# Patient Record
Sex: Male | Born: 1945 | Race: Black or African American | Hispanic: No | Marital: Married | Smoking: Never smoker
Health system: Southern US, Community
[De-identification: ages and names within clinical notes are randomized; demographics above are authoritative.]

## PROBLEM LIST (undated history)

## (undated) DIAGNOSIS — K7460 Unspecified cirrhosis of liver: Secondary | ICD-10-CM

## (undated) DIAGNOSIS — N400 Enlarged prostate without lower urinary tract symptoms: Secondary | ICD-10-CM

## (undated) DIAGNOSIS — R161 Splenomegaly, not elsewhere classified: Secondary | ICD-10-CM

## (undated) DIAGNOSIS — I10 Essential (primary) hypertension: Secondary | ICD-10-CM

## (undated) HISTORY — DX: Benign prostatic hyperplasia without lower urinary tract symptoms: N40.0

## (undated) HISTORY — DX: Splenomegaly, not elsewhere classified: R16.1

## (undated) HISTORY — DX: Unspecified cirrhosis of liver: K74.60

---

## 2016-10-25 HISTORY — PX: US ECHOCARDIOGRAPHY: HXRAD669

## 2018-03-25 HISTORY — PX: UPPER GASTROINTESTINAL ENDOSCOPY: SHX188

## 2018-04-14 ENCOUNTER — Other Ambulatory Visit: Payer: Self-pay

## 2018-04-14 ENCOUNTER — Ambulatory Visit (INDEPENDENT_AMBULATORY_CARE_PROVIDER_SITE_OTHER): Payer: Self-pay | Admitting: Family Medicine

## 2018-04-14 VITALS — BP 144/70 | HR 80 | Temp 99.1°F | Ht 64.5 in | Wt 147.6 lb

## 2018-04-14 DIAGNOSIS — R161 Splenomegaly, not elsewhere classified: Secondary | ICD-10-CM

## 2018-04-14 DIAGNOSIS — R8281 Pyuria: Secondary | ICD-10-CM

## 2018-04-14 DIAGNOSIS — Z0289 Encounter for other administrative examinations: Secondary | ICD-10-CM

## 2018-04-14 DIAGNOSIS — M545 Low back pain: Secondary | ICD-10-CM

## 2018-04-14 DIAGNOSIS — I10 Essential (primary) hypertension: Secondary | ICD-10-CM

## 2018-04-14 DIAGNOSIS — G8929 Other chronic pain: Secondary | ICD-10-CM

## 2018-04-14 DIAGNOSIS — N390 Urinary tract infection, site not specified: Secondary | ICD-10-CM

## 2018-04-14 DIAGNOSIS — K7460 Unspecified cirrhosis of liver: Secondary | ICD-10-CM

## 2018-04-14 DIAGNOSIS — M25562 Pain in left knee: Secondary | ICD-10-CM

## 2018-04-14 DIAGNOSIS — M25561 Pain in right knee: Secondary | ICD-10-CM

## 2018-04-14 DIAGNOSIS — R399 Unspecified symptoms and signs involving the genitourinary system: Secondary | ICD-10-CM

## 2018-04-14 LAB — POCT URINALYSIS DIP (MANUAL ENTRY)
Bilirubin, UA: NEGATIVE
Blood, UA: NEGATIVE
GLUCOSE UA: NEGATIVE mg/dL
Ketones, POC UA: NEGATIVE mg/dL
NITRITE UA: NEGATIVE
PROTEIN UA: NEGATIVE mg/dL
Spec Grav, UA: 1.015 (ref 1.010–1.025)
UROBILINOGEN UA: 1 U/dL
pH, UA: 7.5 (ref 5.0–8.0)

## 2018-04-14 MED ORDER — TAMSULOSIN HCL 0.4 MG PO CAPS: 0.8000 mg | ORAL_CAPSULE | Freq: Every day | ORAL | 1 refills | Status: DC

## 2018-04-14 NOTE — Progress Notes (Signed)
[place] interpreter Fani utilized during today's visit.  [facility] Patient Visit  HPI:  Patient presents to [facility] today for a new patient appointment to establish general primary care, also to discuss multiple issues present from his life in [place]:  1.  Chronic pain:  Knees and back.  Describes aching pain in BL lumbar region of back, worse when bending forward.  Not worse on left versus right side. Pain is 8 / 10.  Doesn't have any OTC analgesics.  Doesn't radiate.  No injuries that he's know of.  Pain suddenly started.    No LE weakness or changes in gait.  No motor weakness/decreased sensation BL LE's.  No fevers or chills.  No dysuria, hematuria.  Does have frequency as below.  No bowel bladder or bowel.    2.  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  Ongoing issue for him for years.  Nocturia 5-6 x nightly almost every night.  Also has frequent urinary accidents where he is not able to make it to the restroom.  No actual incontinence, he always knows when he is going to urinate, just can't make it on time.    3.  Splenomegaly/cirrhosis:  Diagnosed while overseas.  No clear indication.  Negative Hep b per paperwork.  Seen by GI doctor.  Brought records -- show splenomegaly without size provided and small, cirrhotic liver.  EGD showed esophageal candidiasis for which he was treated and varices Grade 2. S/p banding.  No abdominal pain currently.  No bleeding that he's noted.  No melena.  No icterus/jaundice.    Per overseas  paperwork patient has been prescribed taking any candesartan 60 mg, amlodipine 5 mg, vitamin, rosuvastatin 10 mg, tamsulosin 0.4 mg, finasteride 5 mg.  Negative Echo while overseas.  However today he tells me that he is not actually medicines and has not been for some time.  4.  Difficulty seeing: Has known refractive error both eyes based on overseas paperwork.  Has difficulty seeing especially out of his right eye but this is also bilateral.  Is  never worn glasses.  Has never seen an eye doctor before.    ROS: as above, see HPI  Past Medical Hx:  -as above -- no other issues  Past Surgical Hx:  -denies any surgeries  Family Hx: updated in [place] - traveled with some of his children here in [place].  Daughter with epilepsy.  He lost several children and his first wife in the initial flight from [facility].  (became very tearful when discussing this).  Otherwise denies any family history of chronic medical conditions.    Immigrant Social History: - Date arrived in [place]: Apr 08, 2018 - Country of origin: [facility] - Location of refugee camp (if applicable), how long there, and what caused patient to leave home country?: [place] - Primary language: Swahili  -Requires intepreter (essentially speaks no [place]) - Prior work: Insurance underwriter - Lawyer use: denies - Marriage Status: married to wife  - Sexual activity: women - Class B conditions: n/a - Were you beaten or tortured in your country or refugee camp?  Denies    Preventative Care History: -Seen at health department?: yes  PHYSICAL EXAM: BP (!) 144/70   Pulse 80   Temp 99.1 F (37.3 C) (Oral)   Ht 5' 4.5" (1.638 m)   Wt 147 lb 9.6 oz (67 kg)   SpO2 99%   BMI 24.94 kg/m  Gen: Vital signs reviewed.  Elderly male who moves  slowly due to pain in back.  He has urine stains on his trousers.  However he is very much awake and alert and oriented x 4.  Knows his PMH very well.   Head: [place]/AT.   Eyes:  EOMI, PERRL.  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  Ears:  External ears WNL, Bilateral TM's normal without retraction, redness or bulging. Nose:  Septum midline  Mouth:  MMM, tonsils non-erythematous, non-edematous.   Neck:  No JVD or LAD noted Cardiac:  Regular rate and rhythm without murmur auscultated.  Good S1/S2. Pulm:  Clear to auscultation bilaterally with good air movement.  No wheezes or rales  noted.   Abd:  Soft/nondistended/nontender.  I cannot palpate any obvious organomegaly.  Good BS throughout.   Ext:  No clubbing/cyanosis/erythema.  No edema noted bilateral lower extremities.   Neuro:  Alert and oriented to person, place, and date.  No focal deficits noted.   Skin:  Small scars scattered across BL LE's.  Also with 5 mm linear scars across abdomen consistent with prior medicinal scarring.   Psych:  Not depressed or anxious appearing.  Linear and coherent thought process as evidenced by speech pattern. Smiles spontaneously.   Almost 1.5 hours spent in total/direct patient care, including exam and reviewing all overseas records.

## 2018-04-14 NOTE — Patient Instructions (Signed)
It was very good to meet you today.  We are going to do some blood tests and schedule you for an ultrasound test of your stomach.  The same day, you will get xrays of your back and knees.  Come back and see me in 2 weeks.    Take the Flomax medicine once a day to help with your urination and accidents.

## 2018-04-16 ENCOUNTER — Encounter: Payer: Self-pay | Admitting: Family Medicine

## 2018-04-16 DIAGNOSIS — Z0289 Encounter for other administrative examinations: Secondary | ICD-10-CM | POA: Insufficient documentation

## 2018-04-16 DIAGNOSIS — K7460 Unspecified cirrhosis of liver: Secondary | ICD-10-CM | POA: Insufficient documentation

## 2018-04-16 DIAGNOSIS — G8929 Other chronic pain: Secondary | ICD-10-CM | POA: Insufficient documentation

## 2018-04-16 DIAGNOSIS — I10 Essential (primary) hypertension: Secondary | ICD-10-CM | POA: Insufficient documentation

## 2018-04-16 DIAGNOSIS — R161 Splenomegaly, not elsewhere classified: Secondary | ICD-10-CM | POA: Insufficient documentation

## 2018-04-16 DIAGNOSIS — R399 Unspecified symptoms and signs involving the genitourinary system: Secondary | ICD-10-CM | POA: Insufficient documentation

## 2018-04-16 DIAGNOSIS — M25569 Pain in unspecified knee: Secondary | ICD-10-CM

## 2018-04-16 DIAGNOSIS — M549 Dorsalgia, unspecified: Secondary | ICD-10-CM

## 2018-04-16 LAB — URINE CULTURE

## 2018-04-16 NOTE — Assessment & Plan Note (Signed)
Per overseas paperwork.  Not here.  Will wait before starting any antihypertensives.   Was supposed to be on amlodipine and ARB, but not taking these.

## 2018-04-16 NOTE — Assessment & Plan Note (Signed)
Based on overseas paperwork.  None that I can feel today on exam.  Obtaining abd [place] to assess.

## 2018-04-16 NOTE — Assessment & Plan Note (Signed)
Chronic for many years.  No radiation to LE's.  Does occasionally have radiation to buttocks.  Moves slowly secondary to this.  Not taking anything for relief.  Due to lack of prior workup and fact he has had this going on for so many years, plan imaging of back.  Will need to look for any lytic lesions with history of BPH to ensure no mets from any possible prostate CA.

## 2018-04-16 NOTE — Assessment & Plan Note (Addendum)
Newly arrived.  With multiple chronic medical conditions.  - has NOT been seen at HD.  Will obtain screening labs today.   - will FU with me in 2 weeks to address his numerous medical issues.   - doing well with transition thus far.

## 2018-04-16 NOTE — Assessment & Plan Note (Signed)
Per overseas paperwork.  S/p EGD while in [place] which showed varices which were banded.  Hep negative there, will repeat here.  Likely contributor to splenomegaly.

## 2018-04-16 NOTE — Assessment & Plan Note (Signed)
With frequent urination.  Occasional incontinence, which does NOT sound like sudden loss of urine, but rather inability to get to bathroom in time due to his chronic back and knee pain.  Much less likely this is cauda equina symptoms.  Never had any loss of bowels. - starting flomax today.  He was prescribed this overseas but either wasn't told or never picked this up.   - Obtain PSA.  Back xrays pending.

## 2018-04-18 LAB — CBC WITH DIFFERENTIAL/PLATELET
BASOS ABS: 0 10*3/uL (ref 0.0–0.2)
BASOS: 0 %
EOS (ABSOLUTE): 0.4 10*3/uL (ref 0.0–0.4)
EOS: 7 %
HEMATOCRIT: 38.5 % (ref 37.5–51.0)
HEMOGLOBIN: 13.5 g/dL (ref 13.0–17.7)
IMMATURE GRANS (ABS): 0 10*3/uL (ref 0.0–0.1)
Immature Granulocytes: 0 %
LYMPHS: 38 %
Lymphocytes Absolute: 2.2 10*3/uL (ref 0.7–3.1)
MCH: 34.7 pg — AB (ref 26.6–33.0)
MCHC: 35.1 g/dL (ref 31.5–35.7)
MCV: 99 fL — ABNORMAL HIGH (ref 79–97)
MONOCYTES: 16 %
Monocytes Absolute: 0.9 10*3/uL (ref 0.1–0.9)
NEUTROS ABS: 2.2 10*3/uL (ref 1.4–7.0)
Neutrophils: 39 %
Platelets: 145 10*3/uL — ABNORMAL LOW (ref 150–450)
RBC: 3.89 x10E6/uL — ABNORMAL LOW (ref 4.14–5.80)
RDW: 15.2 % (ref 12.3–15.4)
WBC: 5.6 10*3/uL (ref 3.4–10.8)

## 2018-04-18 LAB — COMPREHENSIVE METABOLIC PANEL
ALBUMIN: 3.9 g/dL (ref 3.5–4.8)
ALT: 29 IU/L (ref 0–44)
AST: 43 IU/L — ABNORMAL HIGH (ref 0–40)
Albumin/Globulin Ratio: 0.8 — ABNORMAL LOW (ref 1.2–2.2)
Alkaline Phosphatase: 127 IU/L — ABNORMAL HIGH (ref 39–117)
BUN / CREAT RATIO: 11 (ref 10–24)
BUN: 10 mg/dL (ref 8–27)
Bilirubin Total: 0.8 mg/dL (ref 0.0–1.2)
CO2: 25 mmol/L (ref 20–29)
CREATININE: 0.92 mg/dL (ref 0.76–1.27)
Calcium: 9.7 mg/dL (ref 8.6–10.2)
Chloride: 101 mmol/L (ref 96–106)
GFR calc non Af Amer: 82 mL/min/{1.73_m2} (ref >59)
GFR, EST AFRICAN AMERICAN: 94 mL/min/{1.73_m2} (ref >59)
GLOBULIN, TOTAL: 5 g/dL — AB (ref 1.5–4.5)
GLUCOSE: 80 mg/dL (ref 65–99)
Potassium: 4.4 mmol/L (ref 3.5–5.2)
SODIUM: 140 mmol/L (ref 134–144)
TOTAL PROTEIN: 8.9 g/dL — AB (ref 6.0–8.5)

## 2018-04-18 LAB — LIPID PANEL
Chol/HDL Ratio: 4.8 ratio (ref 0.0–5.0)
Cholesterol, Total: 143 mg/dL (ref 100–199)
HDL: 30 mg/dL — ABNORMAL LOW (ref >39)
LDL CALC: 96 mg/dL (ref 0–99)
Triglycerides: 87 mg/dL (ref 0–149)
VLDL CHOLESTEROL CAL: 17 mg/dL (ref 5–40)

## 2018-04-18 LAB — QUANTIFERON-TB GOLD PLUS
QUANTIFERON NIL VALUE: 0.09 [IU]/mL
QUANTIFERON-TB GOLD PLUS: POSITIVE — AB
QuantiFERON Mitogen Value: 3.77 IU/mL
QuantiFERON TB1 Ag Value: 0.45 IU/mL
QuantiFERON TB2 Ag Value: 0.41 IU/mL

## 2018-04-18 LAB — TSH: TSH: 1.14 u[IU]/mL (ref 0.450–4.500)

## 2018-04-18 LAB — VARICELLA ZOSTER ANTIBODY, IGG: VARICELLA: 2227 {index} (ref >165)

## 2018-04-18 LAB — SICKLE CELL SCREEN: SICKLE CELL SCREEN: NEGATIVE

## 2018-04-18 LAB — HIV ANTIBODY (ROUTINE TESTING W REFLEX): HIV SCREEN 4TH GENERATION: NONREACTIVE

## 2018-04-18 LAB — HEPATITIS C ANTIBODY

## 2018-04-18 LAB — HEPATITIS B SURFACE ANTIGEN: HEP B S AG: NEGATIVE

## 2018-04-22 ENCOUNTER — Encounter: Payer: Self-pay | Admitting: Family Medicine

## 2018-04-22 ENCOUNTER — Ambulatory Visit (HOSPITAL_COMMUNITY): Admission: RE | Admit: 2018-04-22 | Payer: Self-pay | Source: Ambulatory Visit

## 2018-04-30 ENCOUNTER — Encounter: Payer: Self-pay | Admitting: Family Medicine

## 2018-05-01 ENCOUNTER — Other Ambulatory Visit: Payer: Self-pay | Admitting: Internal Medicine

## 2018-05-01 ENCOUNTER — Other Ambulatory Visit: Payer: Self-pay | Admitting: Family Medicine

## 2018-05-01 ENCOUNTER — Ambulatory Visit
Admission: RE | Admit: 2018-05-01 | Discharge: 2018-05-01 | Disposition: A | Discharge status: Home / Self Care | Payer: Self-pay | Source: Ambulatory Visit | Attending: Family Medicine | Admitting: Family Medicine

## 2018-05-01 ENCOUNTER — Ambulatory Visit
Admission: RE | Admit: 2018-05-01 | Discharge: 2018-05-01 | Disposition: A | Discharge status: Home / Self Care | Payer: Self-pay | Source: Ambulatory Visit | Attending: Internal Medicine | Admitting: Internal Medicine

## 2018-05-01 ENCOUNTER — Other Ambulatory Visit: Payer: Self-pay

## 2018-05-01 DIAGNOSIS — G8929 Other chronic pain: Secondary | ICD-10-CM

## 2018-05-01 DIAGNOSIS — M25561 Pain in right knee: Principal | ICD-10-CM

## 2018-05-01 DIAGNOSIS — M545 Low back pain: Principal | ICD-10-CM

## 2018-05-01 DIAGNOSIS — M25562 Pain in left knee: Principal | ICD-10-CM

## 2018-05-01 DIAGNOSIS — A150 Tuberculosis of lung: Secondary | ICD-10-CM

## 2018-05-05 ENCOUNTER — Ambulatory Visit (HOSPITAL_COMMUNITY)
Admission: RE | Admit: 2018-05-05 | Discharge: 2018-05-05 | Disposition: A | Discharge status: Home / Self Care | Payer: Medicaid Other | Source: Ambulatory Visit | Attending: Family Medicine | Admitting: Family Medicine

## 2018-05-05 ENCOUNTER — Encounter: Payer: Self-pay | Admitting: Family Medicine

## 2018-05-05 ENCOUNTER — Ambulatory Visit (INDEPENDENT_AMBULATORY_CARE_PROVIDER_SITE_OTHER): Payer: Medicaid Other | Admitting: Family Medicine

## 2018-05-05 ENCOUNTER — Other Ambulatory Visit: Payer: Self-pay

## 2018-05-05 VITALS — BP 134/72 | HR 75 | Temp 98.3°F | Ht 65.0 in | Wt 144.8 lb

## 2018-05-05 DIAGNOSIS — R161 Splenomegaly, not elsewhere classified: Secondary | ICD-10-CM

## 2018-05-05 DIAGNOSIS — M25561 Pain in right knee: Secondary | ICD-10-CM

## 2018-05-05 DIAGNOSIS — M25562 Pain in left knee: Secondary | ICD-10-CM

## 2018-05-05 DIAGNOSIS — G8929 Other chronic pain: Secondary | ICD-10-CM | POA: Diagnosis not present

## 2018-05-05 DIAGNOSIS — R399 Unspecified symptoms and signs involving the genitourinary system: Secondary | ICD-10-CM | POA: Diagnosis present

## 2018-05-05 DIAGNOSIS — K7460 Unspecified cirrhosis of liver: Secondary | ICD-10-CM

## 2018-05-05 DIAGNOSIS — K5900 Constipation, unspecified: Secondary | ICD-10-CM | POA: Diagnosis not present

## 2018-05-05 DIAGNOSIS — M545 Low back pain: Secondary | ICD-10-CM | POA: Diagnosis not present

## 2018-05-05 LAB — POCT URINALYSIS DIP (MANUAL ENTRY)
Bilirubin, UA: NEGATIVE
GLUCOSE UA: NEGATIVE mg/dL
Ketones, POC UA: NEGATIVE mg/dL
NITRITE UA: NEGATIVE
PH UA: 6.5 (ref 5.0–8.0)
Spec Grav, UA: 1.02 (ref 1.010–1.025)

## 2018-05-05 MED ORDER — TAMSULOSIN HCL 0.4 MG PO CAPS: 0.8000 mg | ORAL_CAPSULE | Freq: Every day | ORAL | 1 refills | Status: DC

## 2018-05-05 MED ORDER — TRAMADOL HCL 50 MG PO TABS: 50.0000 mg | ORAL_TABLET | Freq: Three times a day (TID) | ORAL | 0 refills | Status: DC | PRN

## 2018-05-05 NOTE — Patient Instructions (Signed)
Take the Tramadol for pain every 8 hours as needed.  Take the Tamulosin 2 pills in the AM for your bladder once a day.  Come back and see me in 1 month.    TRamadol = pain 1 pill every 8 hours  TAmsulosin = urination 2 pills once a day

## 2018-05-05 NOTE — Progress Notes (Signed)
Subjective:    Jim Little is a 72 y.o. male who presents to [facility] today for FU for back and knee pain:  1.  Back pain:  Chronic for many years.  Worse with prolonged standing and sitting.   Does have some urge incontinence as below.  No bowel incontinence.  No saddle anesthesia.  Had xrays of back and would like to know the results.  Dull aching pain in back.    2.  Knee pain:  Also persists.  BL.  Worse with prolonged sitting.  Doesn't have anything at home to take for relief.  Dull aching pain BL knees.    3.  LUTS:  Ongoing.  Has had stress and episodes of urge incontinence worse at night.  Nocturia 1-2 x nightly.  No overt bladder "leakage" consistent with diabetic neuropathy or cauda equina syndrome.  Was taking flomax overseas with relief.  No hematuria.  Per himself and his son -- there was some miscommunication last visit evidently, and he never picked up the flomax from last visit.    4.  Constipation:  Seen incidentally on my review of back xrays actual images.  When asked about this, reports daily constipation.  Often goes 2-3 days without BM.  Hard stool.  Started after change in diet when coming to the [place].  Hasn't tried anything for relief.     ROS as above per HPI.    The following portions of the patient's history were reviewed and updated as appropriate: allergies, current medications, past medical history, family and social history, and problem list. Patient is a nonsmoker.    PMH reviewed.  Past Medical History:  Diagnosis Date  . BPH (benign prostatic hyperplasia)   . Cirrhosis ([place])   . Splenomegaly    Past Surgical History:  Procedure Laterality Date  . UPPER GASTROINTESTINAL ENDOSCOPY  03/2018   Per overseas records --candidiasis plus esophageal varices grade 2.  Treated with fluconazole and status post esophageal banding.  Marland Kitchen US ECHOCARDIOGRAPHY  10/2016   per overseas records from [place] - LVEF 68%; mildly calcified aortic valve, normal function otherwise     Medications reviewed. Current Outpatient Medications  Medication Sig Dispense Refill  . tamsulosin (FLOMAX) 0.4 MG CAPS capsule Take 2 capsules (0.8 mg total) by mouth daily. 60 capsule 1   No current facility-administered medications for this visit.      Objective:   Physical Exam BP 134/72   Pulse 75   Temp 98.3 F (36.8 C) (Oral)   Ht 5\' 5"  (1.651 m)   Wt 144 lb 12.8 oz (65.7 kg)   SpO2 98%   BMI 24.10 kg/m  Gen:  Alert, cooperative patient who appears stated age in no acute distress.  Vital signs reviewed. HEENT: EOMI,  MMM.  Cortical cataract noted Right eye.   Cardiac:  Regular rate and rhythm without murmur auscultated.  Good S1/S2. Pulm:  Clear to auscultation bilaterally with good air movement.  No wheezes or rales noted.   Abd:  Soft/nondistended/nontender.   Exts: Non edematous BL  LE, warm and well perfused.  Back:  No bruising or lesions.  Some mild tenderness with direct palpation of

## 2018-05-06 ENCOUNTER — Encounter: Payer: Self-pay | Admitting: Family Medicine

## 2018-05-06 DIAGNOSIS — K5900 Constipation, unspecified: Secondary | ICD-10-CM | POA: Insufficient documentation

## 2018-05-06 LAB — CBC
HEMATOCRIT: 38.2 % (ref 37.5–51.0)
HEMOGLOBIN: 12.7 g/dL — AB (ref 13.0–17.7)
MCH: 33.5 pg — ABNORMAL HIGH (ref 26.6–33.0)
MCHC: 33.2 g/dL (ref 31.5–35.7)
MCV: 101 fL — AB (ref 79–97)
Platelets: 110 10*3/uL — ABNORMAL LOW (ref 150–450)
RBC: 3.79 x10E6/uL — ABNORMAL LOW (ref 4.14–5.80)
RDW: 15.2 % (ref 12.3–15.4)
WBC: 4.6 10*3/uL (ref 3.4–10.8)

## 2018-05-06 LAB — COMPREHENSIVE METABOLIC PANEL
ALBUMIN: 3.9 g/dL (ref 3.5–4.8)
ALK PHOS: 130 IU/L — AB (ref 39–117)
ALT: 25 IU/L (ref 0–44)
AST: 41 IU/L — ABNORMAL HIGH (ref 0–40)
Albumin/Globulin Ratio: 0.9 — ABNORMAL LOW (ref 1.2–2.2)
BUN / CREAT RATIO: 8 — AB (ref 10–24)
BUN: 7 mg/dL — AB (ref 8–27)
Bilirubin Total: 1.1 mg/dL (ref 0.0–1.2)
CO2: 24 mmol/L (ref 20–29)
Calcium: 9.3 mg/dL (ref 8.6–10.2)
Chloride: 104 mmol/L (ref 96–106)
Creatinine, Ser: 0.91 mg/dL (ref 0.76–1.27)
GFR calc Af Amer: 96 mL/min/{1.73_m2} (ref >59)
GFR calc non Af Amer: 83 mL/min/{1.73_m2} (ref >59)
GLUCOSE: 82 mg/dL (ref 65–99)
Globulin, Total: 4.5 g/dL (ref 1.5–4.5)
Potassium: 4.1 mmol/L (ref 3.5–5.2)
Sodium: 140 mmol/L (ref 134–144)
Total Protein: 8.4 g/dL (ref 6.0–8.5)

## 2018-05-06 LAB — PSA: Prostate Specific Ag, Serum: 2.6 ng/mL (ref 0.0–4.0)

## 2018-05-06 NOTE — Assessment & Plan Note (Signed)
Liver ultrasound showed mild splenomegaly and probably cirrhosis.  No CA identified.  - asymptomatic.  Rechecking platelets, CMET today.   - No changes to plan currently

## 2018-05-06 NOTE — Assessment & Plan Note (Signed)
Tricompartmental changes.   Holding off on surgery for now.  Tramadol for as needed pain relief.   FU in 1 month to assess for improvement.

## 2018-05-06 NOTE — Assessment & Plan Note (Signed)
No red flags.  Bladder incontinence NOT related to back pain.  No evidence of bowel incontinence or saddle anesthesia.  Tramadol for pain relief for severe pain.  Tylenol for mild pain.

## 2018-05-06 NOTE — Assessment & Plan Note (Signed)
Persist.   - These do not seem to be related to his back pain.  Checking PSA today.   He never picked up the flomax.  I have printed him a prescription for this.

## 2018-05-06 NOTE — Assessment & Plan Note (Signed)
Treating with OTC Senokot as stimulant laxative.

## 2018-06-15 ENCOUNTER — Encounter: Payer: Self-pay | Admitting: Family Medicine

## 2018-07-14 ENCOUNTER — Other Ambulatory Visit: Payer: Self-pay | Admitting: Internal Medicine

## 2018-07-14 DIAGNOSIS — R7611 Nonspecific reaction to tuberculin skin test without active tuberculosis: Secondary | ICD-10-CM

## 2018-07-15 ENCOUNTER — Encounter: Payer: Self-pay | Admitting: Family Medicine

## 2018-07-15 ENCOUNTER — Ambulatory Visit (INDEPENDENT_AMBULATORY_CARE_PROVIDER_SITE_OTHER): Payer: Medicaid Other | Admitting: Family Medicine

## 2018-07-15 VITALS — BP 132/84 | HR 65 | Temp 98.4°F | Ht 65.0 in | Wt 142.8 lb

## 2018-07-15 DIAGNOSIS — N3941 Urge incontinence: Secondary | ICD-10-CM | POA: Diagnosis not present

## 2018-07-15 DIAGNOSIS — R399 Unspecified symptoms and signs involving the genitourinary system: Secondary | ICD-10-CM

## 2018-07-15 DIAGNOSIS — M25562 Pain in left knee: Secondary | ICD-10-CM | POA: Diagnosis not present

## 2018-07-15 DIAGNOSIS — M25561 Pain in right knee: Secondary | ICD-10-CM | POA: Diagnosis not present

## 2018-07-15 DIAGNOSIS — D539 Nutritional anemia, unspecified: Secondary | ICD-10-CM | POA: Diagnosis not present

## 2018-07-15 DIAGNOSIS — G8929 Other chronic pain: Secondary | ICD-10-CM

## 2018-07-15 DIAGNOSIS — H269 Unspecified cataract: Secondary | ICD-10-CM | POA: Insufficient documentation

## 2018-07-15 LAB — POCT URINALYSIS DIP (MANUAL ENTRY)
BILIRUBIN UA: NEGATIVE
BILIRUBIN UA: NEGATIVE mg/dL
GLUCOSE UA: NEGATIVE mg/dL
Nitrite, UA: POSITIVE — AB
Protein Ur, POC: 30 mg/dL — AB
SPEC GRAV UA: 1.015 (ref 1.010–1.025)
Urobilinogen, UA: 1 E.U./dL
pH, UA: 7 (ref 5.0–8.0)

## 2018-07-15 MED ORDER — TRAMADOL HCL 50 MG PO TABS: 50.0000 mg | ORAL_TABLET | Freq: Three times a day (TID) | ORAL | 1 refills | Status: DC | PRN

## 2018-07-15 MED ORDER — TAMSULOSIN HCL 0.4 MG PO CAPS: 0.8000 mg | ORAL_CAPSULE | Freq: Every day | ORAL | 3 refills | Status: DC

## 2018-07-15 NOTE — Assessment & Plan Note (Signed)
Is health is overall pretty good.  He has diagnosis of hypertension but this is controlled. -Cataract removal is a low risk procedure and he is low risk for any events during the procedure. -Can forward this note to his ophthalmologist who is [facility].  I can feel any information the need for me.

## 2018-07-15 NOTE — Progress Notes (Signed)
Subjective:    Jim Little is a 72 y.o. male who presents to [facility] today for cataract clearance:  1.  Cataracts: Needs surgical clearance for cataract according to the patient.  Has cataracts bilaterally.  Worse in the right eye.  He is scheduled for surgery after his clearance.  No injury to his eye.  Vision is blurry and worse at night.  2.  Urge incontinence: Persist.  His case manager is present with him today.  Corroborates his story.  He has urge incontinence most anytime he stands to go the bathroom.  Has the urge to go and stands and has incontinence.  Denies any stress incontinence.  Was better with the Flomax but is run out of this and did not really seem to pick up anymore.  No hematuria.  No flank pain.  No fevers chills.  3.  Knee pain:  Bilateral.  RIght worse than Left.  Long-standing issue for the patient for many years.  He has x-rays that show tricompartmental degeneration of both knees.  He is interested in having this evaluated by orthopedic.  No falls.  No injuries to his knees.  ROS as above per HPI.   The following portions of the patient's history were reviewed and updated as appropriate: allergies, current medications, past medical history, family and social history, and problem list. Patient is a nonsmoker.    PMH reviewed.  Past Medical History:  Diagnosis Date  . BPH (benign prostatic hyperplasia)   . Cirrhosis ([place])   . Splenomegaly    Past Surgical History:  Procedure Laterality Date  . UPPER GASTROINTESTINAL ENDOSCOPY  03/2018   Per overseas records --candidiasis plus esophageal varices grade 2.  Treated with fluconazole and status post esophageal banding.  Marland Kitchen US ECHOCARDIOGRAPHY  10/2016   per overseas records from [place] - LVEF 68%; mildly calcified aortic valve, normal function otherwise    Medications reviewed. Current Outpatient Medications  Medication Sig Dispense Refill  . tamsulosin (FLOMAX) 0.4 MG CAPS capsule Take 2 capsules (0.8 mg total) by mouth  daily. 60 capsule 1  . traMADol (ULTRAM) 50 MG tablet Take 1 tablet (50 mg total) by mouth every 8 (eight) hours as needed. 30 tablet 0   No current facility-administered medications for this visit.      Objective:   Physical Exam BP 132/84   Pulse 65   Temp 98.4 F (36.9 C) (Oral)   Ht 5\' 5"  (1.651 m)   Wt 142 lb 12.8 oz (64.8 kg)   SpO2 97%   BMI 23.76 kg/m  Gen:  Alert, cooperative patient who appears stated age in no acute distress.  Vital signs reviewed. HEENT: EOMI,  MMM bilateral cataracts noted.  Right eye with cortical cataract. Cardiac:  Regular rate and rhythm without murmur auscultated.  Good S1/S2. Pulm:  Clear to auscultation bilaterally with good air movement.  No wheezes or rales noted.   Abd:  Soft/nondistended/nontender.  Good bowel sounds throughout all four quadrants.  No masses noted.  Knees: Both knees with arthritic changes bilaterally.  Tender to palpation along the joint line bilaterally.  He walks with a limp.  His right knee is somewhat worse swollen than left.  He has no palpable effusion or redness.  No results found for this or any previous visit (from the past 72 hour(s)).

## 2018-07-15 NOTE — Assessment & Plan Note (Signed)
Tricompartmental changes bilaterally based on x-ray.-I will refer him to orthopedics for further evaluation and a discussion on knee replacement.

## 2018-07-15 NOTE — Assessment & Plan Note (Signed)
Persist.  Also with urge incontinence. -Restart Flomax today. -Follow-up in 4 weeks to assess for improvement. -Obtain urinalysis today as well. -His PSA was normal

## 2018-07-15 NOTE — Patient Instructions (Signed)
It was good to see you today.   I have refilled your Tramadol for pain and Flomax for the urine.  The Orthopedic doctor will give you a call in the next week or so for your knees.

## 2018-07-16 LAB — VITAMIN B12: VITAMIN B 12: 985 pg/mL (ref 232–1245)

## 2018-07-16 LAB — COMPREHENSIVE METABOLIC PANEL
A/G RATIO: 0.8 — AB (ref 1.2–2.2)
ALT: 24 IU/L (ref 0–44)
AST: 35 IU/L (ref 0–40)
Albumin: 3.8 g/dL (ref 3.5–4.8)
Alkaline Phosphatase: 147 IU/L — ABNORMAL HIGH (ref 39–117)
BUN/Creatinine Ratio: 7 — ABNORMAL LOW (ref 10–24)
BUN: 7 mg/dL — ABNORMAL LOW (ref 8–27)
Bilirubin Total: 0.8 mg/dL (ref 0.0–1.2)
CALCIUM: 9.5 mg/dL (ref 8.6–10.2)
CHLORIDE: 102 mmol/L (ref 96–106)
CO2: 23 mmol/L (ref 20–29)
Creatinine, Ser: 1.07 mg/dL (ref 0.76–1.27)
GFR, EST AFRICAN AMERICAN: 79 mL/min/{1.73_m2} (ref >59)
GFR, EST NON AFRICAN AMERICAN: 68 mL/min/{1.73_m2} (ref >59)
GLUCOSE: 82 mg/dL (ref 65–99)
Globulin, Total: 4.7 g/dL — ABNORMAL HIGH (ref 1.5–4.5)
POTASSIUM: 4.8 mmol/L (ref 3.5–5.2)
Sodium: 139 mmol/L (ref 134–144)
TOTAL PROTEIN: 8.5 g/dL (ref 6.0–8.5)

## 2018-07-16 LAB — CBC
HEMOGLOBIN: 14.8 g/dL (ref 13.0–17.7)
Hematocrit: 41.8 % (ref 37.5–51.0)
MCH: 35.1 pg — AB (ref 26.6–33.0)
MCHC: 35.4 g/dL (ref 31.5–35.7)
MCV: 99 fL — ABNORMAL HIGH (ref 79–97)
Platelets: 154 10*3/uL (ref 150–450)
RBC: 4.22 x10E6/uL (ref 4.14–5.80)
RDW: 14.3 % (ref 12.3–15.4)
WBC: 5.9 10*3/uL (ref 3.4–10.8)

## 2018-07-17 LAB — URINE CULTURE

## 2018-07-22 ENCOUNTER — Ambulatory Visit
Admission: RE | Admit: 2018-07-22 | Discharge: 2018-07-22 | Disposition: A | Discharge status: Home / Self Care | Payer: Medicaid Other | Source: Ambulatory Visit | Attending: Internal Medicine | Admitting: Internal Medicine

## 2018-07-22 DIAGNOSIS — R7611 Nonspecific reaction to tuberculin skin test without active tuberculosis: Secondary | ICD-10-CM

## 2018-07-22 MED ORDER — IOPAMIDOL (ISOVUE-300) INJECTION 61%
75.0000 mL | Freq: Once | INTRAVENOUS | Status: AC | PRN
  Administered 2018-07-22: 75 mL via INTRAVENOUS

## 2018-11-03 ENCOUNTER — Ambulatory Visit: Payer: Medicaid Other | Admitting: Family Medicine

## 2020-01-31 ENCOUNTER — Ambulatory Visit (INDEPENDENT_AMBULATORY_CARE_PROVIDER_SITE_OTHER): Payer: Medicaid Other | Admitting: Family Medicine

## 2020-01-31 ENCOUNTER — Encounter: Payer: Self-pay | Admitting: Family Medicine

## 2020-01-31 ENCOUNTER — Other Ambulatory Visit: Payer: Self-pay

## 2020-01-31 VITALS — BP 144/82 | HR 93 | Ht 65.0 in | Wt 146.4 lb

## 2020-01-31 DIAGNOSIS — K7460 Unspecified cirrhosis of liver: Secondary | ICD-10-CM

## 2020-01-31 DIAGNOSIS — Z113 Encounter for screening for infections with a predominantly sexual mode of transmission: Secondary | ICD-10-CM | POA: Diagnosis not present

## 2020-01-31 DIAGNOSIS — R399 Unspecified symptoms and signs involving the genitourinary system: Secondary | ICD-10-CM

## 2020-01-31 DIAGNOSIS — R161 Splenomegaly, not elsewhere classified: Secondary | ICD-10-CM

## 2020-01-31 DIAGNOSIS — I10 Essential (primary) hypertension: Secondary | ICD-10-CM

## 2020-01-31 DIAGNOSIS — H1013 Acute atopic conjunctivitis, bilateral: Secondary | ICD-10-CM

## 2020-01-31 DIAGNOSIS — R3129 Other microscopic hematuria: Secondary | ICD-10-CM | POA: Diagnosis not present

## 2020-01-31 DIAGNOSIS — R300 Dysuria: Secondary | ICD-10-CM

## 2020-01-31 LAB — POCT URINALYSIS DIP (MANUAL ENTRY)
Bilirubin, UA: NEGATIVE
Glucose, UA: NEGATIVE mg/dL
Ketones, POC UA: NEGATIVE mg/dL
Nitrite, UA: POSITIVE — AB
Protein Ur, POC: 30 mg/dL — AB
Spec Grav, UA: 1.015 (ref 1.010–1.025)
Urobilinogen, UA: 1 E.U./dL
pH, UA: 8.5 — AB (ref 5.0–8.0)

## 2020-01-31 MED ORDER — TAMSULOSIN HCL 0.4 MG PO CAPS: 0.4000 mg | ORAL_CAPSULE | Freq: Every day | ORAL | 3 refills | Status: DC

## 2020-01-31 MED ORDER — AZELASTINE HCL 0.05 % OP SOLN: 1.0000 [drp] | Freq: Two times a day (BID) | OPHTHALMIC | 12 refills | Status: DC

## 2020-01-31 NOTE — Patient Instructions (Signed)
It was wonderful to see you today.  Thank you for choosing [facility].   Please call [phone] with any questions about today's appointment.  Please be sure to schedule follow up at the front  desk before you leave today.   Holt Woolbright, MD  Family Medicine    

## 2020-01-31 NOTE — Assessment & Plan Note (Signed)
Uncertain cause, and [place] refugees there have been reports of malaria causing splenomegaly.  This would not explain his cirrhosis necessarily.  Referral to gastroenterology for further evaluation of cirrhosis.

## 2020-01-31 NOTE — Assessment & Plan Note (Signed)
Jim Little quite elevated on examination today.  Repeated and improved.  Goal given age is less than 150/90.  We discussed starting medication.  At this time we elected to monitor and repeat at follow-up.  If continues to be elevated will start thiazide like diuretic.

## 2020-01-31 NOTE — Progress Notes (Addendum)
Subjective  Jim Little is a 74 y.o. male is presenting with the following: dysuria  The patient speaks [place] as their primary language.  An interpreter was used for the entire visit.   The patient is joined by his wife and young child today.  He reports overall he is doing okay.  He ambulates with a cane at baseline.  He lives with his wife and her children 2 of whom of disabilities.  Patient reports he is unaware that he has any medical conditions or liver dysfunction problems.  Regard to the patient's cirrhosis per history reports that he has a history of cirrhosis, splenomegaly and esophageal varices requiring banding.  It is unclear of the etiology.  Patient denies any lower extremity edema, abdominal swelling.  He does report intermittent right upper quadrant pain at times but this is intermittent.  He reports he is eating and drinking well.  He denies weight loss.  The patient reports he has never abused alcohol.  He has never been admitted to the hospital for a liver infection.  He has never been on high doses of medications for any condition before.  The patient reports ongoing dysuria.  He reports he has this frequently.  It is worsened over the past several weeks.  He reports burning with urination and associated color change to his urine.  He denies fevers, nausea, vomiting or change in his low back pain. He is not currently taking any medications for this including Flomax.    Objective Vital Signs reviewed BP (!) 144/82   Pulse 93   Ht 5\' 5"  (1.651 m)   Wt 146 lb 6.4 oz (66.4 kg)   SpO2 98%   BMI 24.36 kg/m  HEENT: Sclera anicteric. Dentition is moderate. Appears well hydrated. Neck: Supple Cardiac: Regular rate and rhythm. Normal S1/S2. No murmurs, rubs, or gallops appreciated. Lungs: Clear bilaterally to ascultation.  Abdomen: Normoactive bowel sounds. No tenderness to deep or light palpation.  Unable to appropriately palpate for liver or spleen as patient declined to get  up on exam table secondary to gait difficulties with a cane. Extremities: Warm, well perfused without edema.    Assessments/Plans  HTN (hypertension) Caryl Pina quite elevated on examination today.  Repeated and improved.  Goal given age is less than 150/90.  We discussed starting medication.  At this time we elected to monitor and repeat at follow-up.  If continues to be elevated will start thiazide like diuretic.  Splenomegaly Uncertain cause, and [place] refugees there have been reports of malaria causing splenomegaly.  This would not explain his cirrhosis necessarily.  Referral to gastroenterology for further evaluation of cirrhosis. Current CDC recommendations include treatment with primiquine after G6PDH testing. Pending labs today, will ultrasound at follow up to determine if should have referral to ID for treatment consideration.   Hepatic cirrhosis (HCC) Discussed at length with patient and his wife.  Repeat hepatitis B serologies. He has had a negative HIV and hepatitis C.  RPR today.  Will obtain ferritin level.  He denies heavy alcohol use or any history of significant alcohol use at all. Referral placed to gastroenterology for further evaluation and appropriate screening.  He requires serial screening for [facility] as well as at least consideration of endoscopic evaluation given his history of varices and need for banding in the past.  Lower urinary tract symptoms (LUTS) Chronic but intermittently flaring problem.  He is never had a positive urine culture.  Will obtain today and await treatment.  Discussed obtaining gonorrhea  and chlamydia.  Patient declines at this time.  We will continue to discuss follow-up.  He is sexually active with 1 partner his wife.   HCM At follow up- PCV 23, Tdap   See after visit summary for details of patient instructions  [name], MD  [facility] Medicine Teaching Service

## 2020-01-31 NOTE — Assessment & Plan Note (Signed)
Discussed at length with patient and his wife.  Repeat hepatitis B serologies. He has had a negative HIV and hepatitis C.  RPR today.  Will obtain ferritin level.  He denies heavy alcohol use or any history of significant alcohol use at all.

## 2020-01-31 NOTE — Assessment & Plan Note (Signed)
Chronic but intermittently flaring problem.  He is never had a positive urine culture.  Will obtain today and await treatment.  Discussed obtaining gonorrhea and chlamydia.  Patient declines at this time.  We will continue to discuss follow-up.  He is sexually active with 1 partner his wife.

## 2020-02-01 LAB — RPR: RPR Ser Ql: NONREACTIVE

## 2020-02-01 LAB — BASIC METABOLIC PANEL
BUN/Creatinine Ratio: 20 (ref 10–24)
BUN: 17 mg/dL (ref 8–27)
CO2: 25 mmol/L (ref 20–29)
Calcium: 9.2 mg/dL (ref 8.6–10.2)
Chloride: 105 mmol/L (ref 96–106)
Creatinine, Ser: 0.87 mg/dL (ref 0.76–1.27)
GFR calc Af Amer: 97 mL/min/{1.73_m2} (ref >59)
GFR calc non Af Amer: 84 mL/min/{1.73_m2} (ref >59)
Glucose: 70 mg/dL (ref 65–99)
Potassium: 4.6 mmol/L (ref 3.5–5.2)
Sodium: 140 mmol/L (ref 134–144)

## 2020-02-01 LAB — CBC
Hematocrit: 40.8 % (ref 37.5–51.0)
Hemoglobin: 14.1 g/dL (ref 13.0–17.7)
MCH: 33.8 pg — ABNORMAL HIGH (ref 26.6–33.0)
MCHC: 34.6 g/dL (ref 31.5–35.7)
MCV: 98 fL — ABNORMAL HIGH (ref 79–97)
Platelets: 136 10*3/uL — ABNORMAL LOW (ref 150–450)
RBC: 4.17 x10E6/uL (ref 4.14–5.80)
RDW: 13.5 % (ref 11.6–15.4)
WBC: 4.8 10*3/uL (ref 3.4–10.8)

## 2020-02-01 LAB — HEPATITIS B SURFACE ANTIBODY, QUANTITATIVE: Hepatitis B Surf Ab Quant: 5.7 m[IU]/mL — ABNORMAL LOW (ref >9.9)

## 2020-02-01 LAB — FERRITIN: Ferritin: 83 ng/mL (ref 30–400)

## 2020-02-01 LAB — HEPATIC FUNCTION PANEL
ALT: 27 IU/L (ref 0–44)
AST: 35 IU/L (ref 0–40)
Albumin: 4.1 g/dL (ref 3.7–4.7)
Alkaline Phosphatase: 143 IU/L — ABNORMAL HIGH (ref 39–117)
Bilirubin Total: 0.5 mg/dL (ref 0.0–1.2)
Bilirubin, Direct: 0.18 mg/dL (ref 0.00–0.40)
Total Protein: 8.2 g/dL (ref 6.0–8.5)

## 2020-02-01 LAB — HEPATITIS B CORE ANTIBODY, TOTAL: Hep B Core Total Ab: NEGATIVE

## 2020-02-02 ENCOUNTER — Telehealth: Payer: Self-pay | Admitting: *Deleted

## 2020-02-02 DIAGNOSIS — H1013 Acute atopic conjunctivitis, bilateral: Secondary | ICD-10-CM

## 2020-02-02 LAB — URINE CULTURE: Organism ID, Bacteria: NO GROWTH

## 2020-02-02 MED ORDER — OLOPATADINE HCL 0.2 % OP SOLN: 1.0000 [drp] | Freq: Every day | OPHTHALMIC | 3 refills | Status: DC

## 2020-02-02 NOTE — Telephone Encounter (Signed)
[place] to pharmacy.  Thanks, [name], MD  [facility] Medicine Teaching Service

## 2020-02-02 NOTE — Telephone Encounter (Signed)
Azelastine not covered by Medicaid.  Please see below of formulary.  Let "RN Team" know if you are changing to covered medications or would like to pursue a PA. Christen Bame, CMA

## 2020-02-08 ENCOUNTER — Other Ambulatory Visit: Payer: Self-pay | Admitting: Family Medicine

## 2020-02-08 ENCOUNTER — Ambulatory Visit: Payer: Medicaid Other

## 2020-02-08 DIAGNOSIS — K7460 Unspecified cirrhosis of liver: Secondary | ICD-10-CM

## 2020-03-06 ENCOUNTER — Other Ambulatory Visit (HOSPITAL_COMMUNITY)
Admission: RE | Admit: 2020-03-06 | Discharge: 2020-03-06 | Disposition: A | Discharge status: Home / Self Care | Payer: Medicaid Other | Source: Ambulatory Visit | Attending: Family Medicine | Admitting: Family Medicine

## 2020-03-06 ENCOUNTER — Ambulatory Visit (INDEPENDENT_AMBULATORY_CARE_PROVIDER_SITE_OTHER): Payer: Medicaid Other | Admitting: Family Medicine

## 2020-03-06 ENCOUNTER — Encounter: Payer: Self-pay | Admitting: Family Medicine

## 2020-03-06 ENCOUNTER — Other Ambulatory Visit: Payer: Self-pay

## 2020-03-06 VITALS — BP 145/70 | HR 67 | Ht 66.0 in | Wt 150.6 lb

## 2020-03-06 DIAGNOSIS — R300 Dysuria: Secondary | ICD-10-CM | POA: Insufficient documentation

## 2020-03-06 DIAGNOSIS — I10 Essential (primary) hypertension: Secondary | ICD-10-CM | POA: Diagnosis not present

## 2020-03-06 DIAGNOSIS — N400 Enlarged prostate without lower urinary tract symptoms: Secondary | ICD-10-CM

## 2020-03-06 DIAGNOSIS — K029 Dental caries, unspecified: Secondary | ICD-10-CM

## 2020-03-06 DIAGNOSIS — H1013 Acute atopic conjunctivitis, bilateral: Secondary | ICD-10-CM | POA: Diagnosis not present

## 2020-03-06 DIAGNOSIS — R399 Unspecified symptoms and signs involving the genitourinary system: Secondary | ICD-10-CM

## 2020-03-06 DIAGNOSIS — K7460 Unspecified cirrhosis of liver: Secondary | ICD-10-CM | POA: Diagnosis not present

## 2020-03-06 DIAGNOSIS — D7589 Other specified diseases of blood and blood-forming organs: Secondary | ICD-10-CM | POA: Diagnosis not present

## 2020-03-06 MED ORDER — FLUTICASONE PROPIONATE 50 MCG/ACT NA SUSP: 2.0000 | Freq: Every day | NASAL | 6 refills | Status: DC

## 2020-03-06 MED ORDER — OLOPATADINE HCL 0.2 % OP SOLN: 1.0000 [drp] | Freq: Every day | OPHTHALMIC | 3 refills | Status: DC

## 2020-03-06 NOTE — Patient Instructions (Addendum)
It was wonderful to see you today.  Please bring ALL of your medications with you to every visit.   Thank you for choosing [facility].   Please call [phone] with any questions about today's appointment.  Please be sure to schedule follow up at the front  desk before you leave today.   [name], MD  Family Medicine   You will be called by: 1. Social worker about transportation  2. Gastroenterology for your liver 3. Urology for your urine symptoms    I will call you with blood work results

## 2020-03-06 NOTE — Assessment & Plan Note (Signed)
Improved today, continue to monitor closely. Would consider amlodipine at follow up if persists. Given age and frail status, <150/90 goal.

## 2020-03-06 NOTE — Assessment & Plan Note (Signed)
Hepatitis A titers today.  Referral to Gastroenterology for further recommendations and imaging. Appreciate input and consultation.   CCM referral given difficulty with transportation and coordinating appointments.

## 2020-03-06 NOTE — Assessment & Plan Note (Signed)
DDX includes BPH, less likely malignancy given PSA. Also consider chronic infection. Urine culture negative at last visit, GC/CT today. Given persistence of symptoms, referral to Urology. Discussed. Consider addition of finasteride at follow up.

## 2020-03-06 NOTE — Progress Notes (Signed)
    SUBJECTIVE:   CHIEF COMPLAINT / HPI:  Dysuria  The patient speaks Swahili as their primary language.  An interpreter was used for the entire visit.   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  Cirrhosis The patient reports he did not know he had liver disease. Denies alcohol use. Denies abdominal swelling, rectal bleeding, or melena. He is amenable to seeing a gastroenterologist.   LUTS The patient has a long standing history of dysuria and urinary issues. Further history today reveals he voids 4-5 times per night. He has urinary urgency and sometimes cannot make it to the restroom on time. He has a weak stream and difficulty starting his stream. Restarting tamsulosin improved his symptoms. He continues to endorse pain with urination. PSA WNL in 2019. No DRE performed per note.   PERTINENT  PMH / PSH/Family/Social History :  Swahili speaking  Cirrhosis (on ultrasound, labs potentially confirm this, has not seen GI) Cataracts    OBJECTIVE:   BP (!) 145/70   Pulse 67   Ht 5\' 6"  (1.676 m)   Wt 150 lb 9.6 oz (68.3 kg)   SpO2 98%   BMI 24.31 kg/m   HEENT: Sclera anicteric. Dentition is moderate. Appears well hydrated. Neck: Supple Cardiac: Regular rate and rhythm. Normal S1/S2. No murmurs, rubs, or gallops appreciated. Lungs: Clear bilaterally to ascultation.  GU Evidence of urinary incontinence DRE notable for enlarged but smooth prostate  Psych: Pleasant and appropriate    ASSESSMENT/PLAN:   Lower urinary tract symptoms (LUTS) DDX includes BPH, less likely malignancy given PSA. Also consider chronic infection. Urine culture negative at last visit, GC/CT today. Given persistence of symptoms, referral to Urology. Discussed. Consider addition of finasteride at follow up.    Hepatic cirrhosis (HCC) Hepatitis A titers today.  Referral to Gastroenterology for further  recommendations and imaging. Appreciate input and consultation.   CCM referral given difficulty with transportation and coordinating appointments.   HTN (hypertension) Improved today, continue to monitor closely. Would consider amlodipine at follow up if persists. Given age and frail status, <150/90 goal.    Poor dentition Referral to Dentistry, appreciate CCM help in scheduling/transportation to appointment    [name], [place]

## 2020-03-07 ENCOUNTER — Encounter: Payer: Self-pay | Admitting: Family Medicine

## 2020-03-07 LAB — URINE CYTOLOGY ANCILLARY ONLY
Chlamydia: NEGATIVE
Comment: NEGATIVE
Comment: NORMAL
Neisseria Gonorrhea: NEGATIVE

## 2020-03-07 LAB — HEPATIC FUNCTION PANEL
ALT: 19 IU/L (ref 0–44)
AST: 27 IU/L (ref 0–40)
Albumin: 4.1 g/dL (ref 3.7–4.7)
Alkaline Phosphatase: 141 IU/L — ABNORMAL HIGH (ref 39–117)
Bilirubin Total: 0.6 mg/dL (ref 0.0–1.2)
Bilirubin, Direct: 0.22 mg/dL (ref 0.00–0.40)
Total Protein: 8.1 g/dL (ref 6.0–8.5)

## 2020-03-07 LAB — VITAMIN B12: Vitamin B-12: 694 pg/mL (ref 232–1245)

## 2020-03-07 LAB — HEPATITIS A ANTIBODY, TOTAL: hep A Total Ab: POSITIVE — AB

## 2020-03-07 LAB — GAMMA GT: GGT: 150 IU/L — ABNORMAL HIGH (ref 0–65)

## 2020-03-07 LAB — FOLATE: Folate: 5.7 ng/mL (ref >3.0)

## 2020-03-20 ENCOUNTER — Other Ambulatory Visit: Payer: Self-pay

## 2020-03-20 ENCOUNTER — Ambulatory Visit: Payer: Medicaid Other

## 2020-03-20 NOTE — Chronic Care Management (AMB) (Deleted)
  Care Management   Outreach Note  03/20/2020 Name: Jim Little MRN: [id number] DOB: 1946-11-04  Referred by: [name], MD Reason for referral : No chief complaint on file.   An unsuccessful telephone outreach was attempted today. The patient was referred to the case management team for assistance with care management and care coordination.   Follow Up Plan: A HIPPA compliant phone message was left for the patient providing contact information and requesting a return call.  The patient has been provided with contact information for the care management team and has been advised to call with any health related questions or concerns.  Called the mobile number with the interpreter Inda Merlin [phone] and left a message with his brother to return my call.  [name] RN, BSN, [facility] Care Management Coordinator [place] Phone: [phone] Fax: [phone]

## 2020-03-20 NOTE — Chronic Care Management (AMB) (Signed)
  Care Management   Outreach Note  03/20/2020 Name: Jim Little MRN: [id number] DOB: March 05, 1946  Referred by: [name], MD Reason for referral : Care Coordination (Care Mangement RNCM Initial)   An unsuccessful telephone outreach was attempted today. The patient was referred to the case management team for assistance with care management and care coordination.   Follow Up Plan: A HIPPA compliant phone message was left for the patient providing contact information and requesting a return call.  The care management team will reach out to the patient again over the next 5-7 days.    The brother was contacted on the mobile number with the interpreter [place] # [phone]  [name] RN, BSN, [facility] Care Management Coordinator [place] Phone: [phone] Fax: [phone]

## 2020-03-22 ENCOUNTER — Encounter: Payer: Self-pay | Admitting: Gastroenterology

## 2020-03-30 ENCOUNTER — Other Ambulatory Visit: Payer: Self-pay

## 2020-03-30 ENCOUNTER — Ambulatory Visit: Payer: Medicaid Other

## 2020-03-30 DIAGNOSIS — K7460 Unspecified cirrhosis of liver: Secondary | ICD-10-CM

## 2020-03-30 DIAGNOSIS — Z139 Encounter for screening, unspecified: Secondary | ICD-10-CM

## 2020-03-30 DIAGNOSIS — R399 Unspecified symptoms and signs involving the genitourinary system: Secondary | ICD-10-CM

## 2020-03-30 NOTE — Chronic Care Management (AMB) (Signed)
  Care Management   Outreach Note  03/30/2020 Name: Jim Little MRN: [id number] DOB: [birth year]  Referred by: [name], MD Reason for referral : No chief complaint on file.   A second unsuccessful telephone outreach was attempted today. The patient was referred to the case management team for assistance with care management and care coordination.   Follow Up Plan: The care management team will reach out to the patient again over the next 7 days.  2nd attempt to out reach the patient.  Referral sent to care guides for transportation to appointment the patient has on 04/04/20 for gastroenterology. I will try to outreach the patient again to set up an appointment Urology .  [name] RN, BSN, [facility] Care Management Coordinator [place] Phone: [phone] Fax: [phone]

## 2020-04-04 ENCOUNTER — Ambulatory Visit: Payer: Medicaid Other | Admitting: Gastroenterology

## 2020-04-07 ENCOUNTER — Ambulatory Visit: Payer: Medicaid Other

## 2020-04-07 ENCOUNTER — Other Ambulatory Visit: Payer: Self-pay

## 2020-04-07 NOTE — Patient Instructions (Signed)
Visit Information  Goals Addressed            This Visit's Progress   . I need help with transportation (pt-stated)       CARE PLAN ENTRY (see longitudinal plan of care for additional care plan information)  Current Barriers:  . 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  Interventions:  . Evaluation of current treatment plan related to enlarged prostate and patient's adherence to plan as established by provider. . Advised patient to set up his voice mail with family so he will not miss messages using (interpreter) [place].  Patient verbalized understanding. . Discussed plans with patient for ongoing care management follow up and provided patient with direct contact information for care management team . Reviewed scheduled/upcoming provider appointments including: June 24th at 1030 with [facility] appointment made with patient on the phone with interpreter . [facility] will send the patient a packet a reminder card and a map. . Patient states that he did attend his appointment to GI on 04/04/20. . Care Guide referral sent for transportation  Patient Self Care Activities:  . Patient verbalizes understanding of plan  . Attends all scheduled provider appointments . Unable to independently self manage scheduling appointments and transportation  Initial goal documentation        Jim Little was given information about Care Management services today including:  1. Care Management services include personalized support from designated clinical staff supervised by his physician, including individualized plan of care and coordination with other care providers 2. 24/7 contact phone numbers for assistance for urgent and routine care  needs. 3. The patient may stop CCM services at any time (effective at the end of the month) by phone call to the office staff.  Patient agreed to services and verbal consent obtained.   The patient verbalized understanding of instructions provided today and declined a print copy of patient instruction materials.   The care management team will reach out to the patient again over the next 21 days.  The patient has been provided with contact information for the care management team and has been advised to call with any health related questions or concerns.   Jim Arms RN, BSN, [facility] Care Management Coordinator [place] Phone: [phone] Fax: [phone]

## 2020-04-07 NOTE — Chronic Care Management (AMB) (Signed)
Care Management   Initial Visit Note  04/07/2020 Name: Jim Little MRN: [id number] DOB: [birth year]   Assessment: Jim Little is a 74 y.o. year old male who sees [name], MD for primary care. The care management team was consulted for assistance with care management and care coordination needs related to Care Coordination for appointment and transportation related to LUTZ  Enlarged prostate.   Review of patient status, including review of consultants reports, relevant laboratory and other test results, and collaboration with appropriate care team members and the patient's provider was performed as part of comprehensive patient evaluation and provision of care management services.    SDOH (Social Determinants of Health) assessments performed: No See Care Plan activities for detailed interventions related to [facility])     Outpatient Encounter Medications as of 04/07/2020  Medication Sig  . azelastine (OPTIVAR) 0.05 % ophthalmic solution Place 1 drop into both eyes 2 (two) times daily.  . fluticasone (FLONASE) 50 MCG/ACT nasal spray Place 2 sprays into both nostrils daily.  . Olopatadine HCl 0.2 % SOLN Apply 1 drop to eye daily.  . tamsulosin (FLOMAX) 0.4 MG CAPS capsule Take 1 capsule (0.4 mg total) by mouth daily after supper.  . traMADol (ULTRAM) 50 MG tablet Take 1 tablet (50 mg total) by mouth every 8 (eight) hours as needed.   No facility-administered encounter medications on file as of 04/07/2020.    Goals Addressed            This Visit's Progress   . I need help with transportation (pt-stated)       CARE PLAN ENTRY (see longitudinal plan of care for additional care plan information)  Current Barriers:  . Care Coordination needs related to scheduling appointment and transportation in a patient with LUTS with Enlarge Prostate  (disease states)  Nurse Case Manager Clinical Goal(s):  Jim Little Kitchen Over the next 30 days, patient will verbalize understanding of plan for Urology appointment  made and transportation.  Interventions:  . Evaluation of current treatment plan related to enlarged prostate and patient's adherence to plan as established by provider. . Advised patient to set up his voice mail with family so he will not miss messages using (interpreter) Jim Little.  Patient verbalized understanding. . Discussed plans with patient for ongoing care management follow up and provided patient with direct contact information for care management team . Reviewed scheduled/upcoming provider appointments including: June 24th at 1030 with [facility] appointment made with patient on the phone with interpreter . [facility] will send the patient a packet a reminder card and a map. . Patient states that he did attend his appointment to GI on 04/04/20. . Care Guide referral sent for transportation  Patient Self Care Activities:  . Patient verbalizes understanding of plan  . Attends all scheduled provider appointments . Unable to independently self manage scheduling appointments and transportation  Initial goal documentation         Follow up plan:  The care management team will reach out to the patient again over the next 21 days.  The patient has been provided with contact information for the care management team and has been advised to call with any health related questions or concerns.   Jim Little was given information about Care Management services today including:  1. Care Management services include personalized support from designated clinical staff supervised by a physician, including individualized plan of care and coordination with other care providers 2. 24/7 contact phone numbers for assistance for urgent and routine  care needs. 3. The patient may stop Care Management services at any time (effective at the end of the month) by phone call to the office staff.  Patient agreed to services and verbal consent obtained.  [name] RN, BSN, [facility] Care Management  Coordinator [place] Phone: [phone] Fax: [phone]

## 2020-04-10 ENCOUNTER — Ambulatory Visit: Payer: Self-pay

## 2020-04-10 ENCOUNTER — Other Ambulatory Visit: Payer: Self-pay

## 2020-04-10 ENCOUNTER — Telehealth: Payer: Self-pay

## 2020-04-10 ENCOUNTER — Encounter: Payer: Self-pay | Admitting: Family Medicine

## 2020-04-10 ENCOUNTER — Ambulatory Visit (INDEPENDENT_AMBULATORY_CARE_PROVIDER_SITE_OTHER): Payer: Medicaid Other | Admitting: Family Medicine

## 2020-04-10 VITALS — BP 136/76 | HR 75 | Wt 145.0 lb

## 2020-04-10 DIAGNOSIS — K7469 Other cirrhosis of liver: Secondary | ICD-10-CM

## 2020-04-10 DIAGNOSIS — I10 Essential (primary) hypertension: Secondary | ICD-10-CM

## 2020-04-10 DIAGNOSIS — Z23 Encounter for immunization: Secondary | ICD-10-CM | POA: Diagnosis not present

## 2020-04-10 DIAGNOSIS — R399 Unspecified symptoms and signs involving the genitourinary system: Secondary | ICD-10-CM

## 2020-04-10 DIAGNOSIS — K029 Dental caries, unspecified: Secondary | ICD-10-CM

## 2020-04-10 DIAGNOSIS — Z8669 Personal history of other diseases of the nervous system and sense organs: Secondary | ICD-10-CM | POA: Diagnosis present

## 2020-04-10 DIAGNOSIS — R300 Dysuria: Secondary | ICD-10-CM

## 2020-04-10 DIAGNOSIS — H539 Unspecified visual disturbance: Secondary | ICD-10-CM | POA: Diagnosis not present

## 2020-04-10 LAB — POCT URINALYSIS DIP (MANUAL ENTRY)
Bilirubin, UA: NEGATIVE
Glucose, UA: NEGATIVE mg/dL
Ketones, POC UA: NEGATIVE mg/dL
Nitrite, UA: NEGATIVE
Protein Ur, POC: 100 mg/dL — AB
Spec Grav, UA: 1.02 (ref 1.010–1.025)
Urobilinogen, UA: 0.2 E.U./dL
pH, UA: 8.5 — AB (ref 5.0–8.0)

## 2020-04-10 NOTE — Assessment & Plan Note (Signed)
Referral to Urology scheduled.

## 2020-04-10 NOTE — Telephone Encounter (Signed)
04/10/20  Spoke with patient's wife regarding Medicaid transportation. Spoke with representative  Medicaid transportation, patient is signed up for transportation services and will need to call back the first week in June to schedule his ride for June 24.  They have Sw[place]ili interpreter service. Will call patient to remind him on June 2.  Ambrose Mantle [phone]

## 2020-04-10 NOTE — Progress Notes (Addendum)
    SUBJECTIVE:   CHIEF COMPLAINT / HPI:   The patient speaks Swahili as their primary language.  An interpreter was used for the entire visit.   Jim Little is a pleasant 74-year-old gentleman with history of hepatic cirrhosis, chronic lower urinary tract symptoms with BPH, and history of cataracts presenting today for routine follow-up.  The patient reports he would like to see a dentist.  He has ongoing dental decay that is a difficult for him to chew some foods.  Weight has been stable.  He denies mouth ulcers or lesions.  The patient reports he did not have transportation to his gastroenterology appointment and thus missed it.  He denies abdominal swelling, lower extremity edema or confusion at home.  Denies alcohol use.  The patient reports ongoing urinary symptoms.  He thinks he has infection.  Prior urine culture and GC chlamydia negative.  The patient reports ongoing anxiety related to his sone with disability.  Denies low mood.  Sadness.  He reports he does stay up at night but this is mostly related to his urinary symptoms.  PERTINENT  PMH / PSH/Family/Social History : reviewed and updated.   OBJECTIVE:   BP 136/76   Pulse 75   Wt 145 lb (65.8 kg)   SpO2 99%   BMI 23.40 kg/m   HEENT: Sclera anicteric. Dentition is poor. Appears well hydrated. Neck: Supple Cardiac: Regular rate and rhythm. Normal S1/S2. No murmurs, rubs, or gallops appreciated. Lungs: Clear bilaterally to ascultation.   Extremities: Warm, well perfused without edema.  Skin: Warm, dry  ASSESSMENT/PLAN:   Hepatic cirrhosis (HCC) Rescheduled with Gastroenterology. Reviewed prior vaccines---Hep B vaccine series given previously. Non-immune, consider repeat vaccines.  Hep A immune, Hep C negative.   HTN (hypertension) Not on treatment, BP improved with dietary changes. Continue to monitor. Goal <150/90 given age.   Lower urinary tract symptoms (LUTS) Referral to Urology scheduled. Mycoplasma ordered.      History of cataracts patient reports ongoing difficulties with this.  Remains unchanged from prior.  Recommended follow-up with ophthalmology.  Referral placed  Dental decay referral to dentistry given.  I also gave him a list.  Follow-up hepatitis B vaccine.  Tdap was administered today.  The patient also met with the RN care coordinator today to arrange follow-up and transportation to his appointments. Recommended and information given about COVID vaccine.   Carina Brown, MD  Family Medicine Teaching Service  [facility]    

## 2020-04-10 NOTE — Assessment & Plan Note (Signed)
Not on treatment, BP improved with dietary changes. Continue to monitor. Goal <150/90 given age.

## 2020-04-10 NOTE — Chronic Care Management (AMB) (Signed)
Care Management   Follow Up Note   04/10/2020 Name: Jim Little MRN: [id number] DOB: 09-18-1946  Referred by: [name], MD Reason for referral : Care Coordination (  Care Managment GI Appoinment)   Jim Little is a 74 y.o. year old male who is a primary care patient of [name], MD. The care management team was consulted for assistance with care management and care coordination needs.    Review of patient status, including review of consultants reports, relevant laboratory and other test results, and collaboration with appropriate care team members and the patient's provider was performed as part of comprehensive patient evaluation and provision of chronic care management services.    SDOH (Social Determinants of Health) assessments performed: No See Care Plan activities for detailed interventions related to [facility])     Advanced Directives: See Care Plan and Vynca application for related entries.   Goals Addressed            This Visit's Progress   . I need help with transportation (pt-stated)       CARE PLAN ENTRY (see longitudinal plan of care for additional care plan information)  Current Barriers:  . 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  Interventions:  . Evaluation of current treatment plan related to enlarged prostate and patient's adherence to plan as established by provider. . Advised patient to set up his voice mail with family so he will not miss messages using (interpreter) [place].  Patient verbalized understanding. . Discussed plans with patient for ongoing care management follow up and provided patient with direct contact information for care management team . Reviewed scheduled/upcoming provider  appointments including: June 24th at 1030 with [facility] appointment made with patient on the phone with interpreter . [facility] will send the patient a packet a reminder card and a map. . Patient states that he did attend his appointment to GI on 04/04/20. . Care Guide referral sent for transportation . 04/10/20 . Patient was seen in in office for visit with Dr Owens Shark she wanted me to help the patient to reschedule his appointment with GI. He stated to me earlier that he had went to the appointment but was confused. Marland Kitchen RNCM went into the room with the interpreter and the patient and called [facility] and scheduled the patient and appointment on June 9th at 10 am with  Tye Savoy. . This information was written down for him along with the information for his Urology appointment.  The interpreter stated that he had a family member that would be able to read the information  . RNCM will send the appointment information along to the care guides to schedule the patient transportation.    Patient Self Care Activities:  . Patient verbalizes understanding of plan  . Attends all scheduled provider appointments . Unable to independently self manage scheduling appointments and transportation  Please see past updates related to this goal by clicking on the "Past Updates" button in the selected goal          The care management team will reach out to the patient again over the next 21 days.  The patient has been provided with contact information for the care management team and has been advised to call with any health related  questions or concerns.   [name] RN, BSN, [facility] Care Management Coordinator [place] Phone: [phone] Fax: [phone]

## 2020-04-10 NOTE — Assessment & Plan Note (Addendum)
Rescheduled with Gastroenterology. Reviewed prior vaccines---Hep B vaccine series given previously. Non-immune, consider repeat vaccines.  Hep A immune, Hep C negative.

## 2020-04-10 NOTE — Patient Instructions (Addendum)
It was wonderful to see you today.  Please bring ALL of your medications with you to every visit.   Thank you for choosing [facility].   Please call [phone] with any questions about today's appointment.  Please be sure to schedule follow up at the front  desk before you leave today.   [name], MD  Family Medicine   Follow up in 3 months   [address] [place], [place] [address] Hours: Mon&Thu 8-5, Sat 8-12 Type: Pfizer (12+) Schedule Online  [place] [address] [place], [place] [address] Hours: Fri&Sat 8-12p, Tues (May 18) 8-5 Type: Pfizer (12+) Schedule Online  [place] is hosting vaccine clinics at area high schools. These clinics are open to the public. Hours: View Schedule Type: Pfizer (12+) Call [phone]  WALK-INS PERMITTED: Please arrive two hours before clinic closing if you do not have an appointment. NO ID Required: Vaccine providers may ask for an ID for insurance or HRSA reimbursement purposes. However, everyone will be vaccinated even if they don't present an ID. No one will be turned away. Phone Assistance: Please call [phone], Monday through Friday between 7 a.m. and 7 p.m. Other Vaccine Locations: Find all vaccine locations throughout the state of [place] at [email or web address]

## 2020-04-10 NOTE — Patient Instructions (Signed)
Visit Information  Goals Addressed            This Visit's Progress   . I need help with transportation (pt-stated)       CARE PLAN ENTRY (see longitudinal plan of care for additional care plan information)  Current Barriers:  . 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  Interventions:  . Evaluation of current treatment plan related to enlarged prostate and patient's adherence to plan as established by provider. . Advised patient to set up his voice mail with family so he will not miss messages using (interpreter) [place].  Patient verbalized understanding. . Discussed plans with patient for ongoing care management follow up and provided patient with direct contact information for care management team . Reviewed scheduled/upcoming provider appointments including: June 24th at 1030 with [facility] appointment made with patient on the phone with interpreter . [facility] will send the patient a packet a reminder card and a map. . Patient states that he did attend his appointment to GI on 04/04/20. . Care Guide referral sent for transportation . 04/10/20 . Patient was seen in in office for visit with Dr Owens Shark she wanted me to help the patient to reschedule his appointment with GI. He stated to me earlier that he had went to the appointment but was confused. Marland Kitchen RNCM went into the room with the interpreter and the patient and called [facility] and scheduled the patient and appointment on June 9th at 10 am with  Tye Savoy. . This information was written down for him along with the information for his Urology appointment.  The interpreter stated that he had a family member that would be able to read the information  . RNCM will send the  appointment information along to the care guides to schedule the patient transportation.    Patient Self Care Activities:  . Patient verbalizes understanding of plan  . Attends all scheduled provider appointments . Unable to independently self manage scheduling appointments and transportation  Please see past updates related to this goal by clicking on the "Past Updates" button in the selected goal         Jim Little was given information about Care Management services today including:  1. Care Management services include personalized support from designated clinical staff supervised by his physician, including individualized plan of care and coordination with other care providers 2. 24/7 contact phone numbers for assistance for urgent and routine care needs. 3. The patient may stop CCM services at any time (effective at the end of the month) by phone call to the office staff.  Patient agreed to services and verbal consent obtained.   The patient verbalized understanding of instructions provided today and declined a print copy of patient instruction materials.   The care management team will reach out to the patient again over the next 21 days.  The patient has been provided with contact information for the care management team and has been advised to call with any health related questions or concerns.   [name] RN, BSN, [facility] Care Management Coordinator [place] Phone: [phone] Fax: [phone]

## 2020-04-12 ENCOUNTER — Encounter: Payer: Self-pay | Admitting: Family Medicine

## 2020-04-12 LAB — URINE CULTURE

## 2020-04-26 ENCOUNTER — Telehealth: Payer: Self-pay

## 2020-04-26 NOTE — Telephone Encounter (Signed)
04/26/20 Spoke with patient's wife pe her request to remind patient to contact Medicaid transportation for medical appointments.  No other resources needed. Closing referral. Ambrose Mantle [phone].

## 2020-04-28 ENCOUNTER — Ambulatory Visit: Payer: Medicaid Other

## 2020-04-28 ENCOUNTER — Other Ambulatory Visit: Payer: Self-pay

## 2020-04-28 NOTE — Chronic Care Management (AMB) (Signed)
Care Management   Follow Up Note   04/28/2020 Name: Rowdy Guerrini MRN: [id number] DOB: Dec 17, 1945  Referred by: [name], MD Reason for referral : Care Coordination (Care Management RNCM F/U on Appointments)   Shanna Strength is a 74 y.o. year old male who is a primary care patient of [name], MD. The care management team was consulted for assistance with care management and care coordination needs.    Review of patient status, including review of consultants reports, relevant laboratory and other test results, and collaboration with appropriate care team members and the patient's provider was performed as part of comprehensive patient evaluation and provision of chronic care management services.    SDOH (Social Determinants of Health) assessments performed: No See Care Plan activities for detailed interventions related to [facility])     Advanced Directives: See Care Plan and Vynca application for related entries.   Goals Addressed            This Visit's Progress   . I need help with transportation (pt-stated)       CARE PLAN ENTRY (see longitudinal plan of care for additional care plan information)  Current Barriers:  . 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  Interventions:  . Evaluation of current treatment plan related to enlarged prostate and patient's adherence to plan as established by provider. . Advised patient to set up his voice mail with family so he will not miss messages using (interpreter) [place].  Patient verbalized understanding. . Discussed plans with patient for ongoing care management follow up and provided patient with direct contact information for care management team . Reviewed scheduled/upcoming  provider appointments including: June 24th at 1030 with [facility] appointment made with patient on the phone with interpreter . [facility] will send the patient a packet a reminder card and a map. . Patient states that he did attend his appointment to GI on 04/04/20. . Care Guide referral sent for transportation . 04/28/20 . Called the patient at his home and spoke with his daughter present using interpreter Tera # [phone] to remind the patient about his appointments.  The daughter stated that the appointments was useless because no one was able to go with her father. They had not called and scheduled the transportation with Medicaid yet.  I assured her that an interpreter will be at each appointment with her father so that he would understand.  An interpreter was confirmed in and in the computer for his appointment on the 9th and RNCM called while on the phone with the daughter to [facility] and spoke with Lavell Luster and they where getting an interpreter for her father on 6/24.  She felt better and said that she had the number/ adddress and would call Medicaid at [phone] to schedule his transportation . RNCM gave the daughter my phone number in case she had any more question or concerns to call me.  Patient Self Care Activities:  . Patient verbalizes understanding of plan  . Attends all scheduled provider appointments . Unable to independently self manage scheduling appointments and transportation  Please see past updates related to this goal by clicking on the "Past Updates" button in the selected goal          The care management team will  reach out to the patient again over the next 14 days.  The patient has been provided with contact information for the care management team and has been advised to call with any health related questions or concerns.   [name] RN, BSN, [facility] Care Management Coordinator [place] Phone: [phone] Fax:  [phone]

## 2020-05-03 ENCOUNTER — Ambulatory Visit: Payer: Medicaid Other | Admitting: Nurse Practitioner

## 2020-05-04 ENCOUNTER — Telehealth: Payer: Medicaid Other

## 2020-05-10 ENCOUNTER — Encounter (HOSPITAL_COMMUNITY): Payer: Self-pay | Admitting: *Deleted

## 2020-05-10 ENCOUNTER — Emergency Department (HOSPITAL_COMMUNITY): Payer: Medicaid Other

## 2020-05-10 ENCOUNTER — Other Ambulatory Visit: Payer: Self-pay

## 2020-05-10 ENCOUNTER — Inpatient Hospital Stay (HOSPITAL_COMMUNITY)
Admission: AD | Admit: 2020-05-10 | Discharge: 2020-05-14 | DRG: 871 | Disposition: A | Discharge status: Home Health | Payer: Medicaid Other | Attending: Family Medicine | Admitting: Family Medicine

## 2020-05-10 DIAGNOSIS — Z20822 Contact with and (suspected) exposure to covid-19: Secondary | ICD-10-CM | POA: Diagnosis present

## 2020-05-10 DIAGNOSIS — R911 Solitary pulmonary nodule: Secondary | ICD-10-CM | POA: Diagnosis present

## 2020-05-10 DIAGNOSIS — K7460 Unspecified cirrhosis of liver: Secondary | ICD-10-CM | POA: Diagnosis present

## 2020-05-10 DIAGNOSIS — R399 Unspecified symptoms and signs involving the genitourinary system: Secondary | ICD-10-CM | POA: Diagnosis present

## 2020-05-10 DIAGNOSIS — A419 Sepsis, unspecified organism: Secondary | ICD-10-CM | POA: Diagnosis present

## 2020-05-10 DIAGNOSIS — G92 Toxic encephalopathy: Secondary | ICD-10-CM | POA: Diagnosis present

## 2020-05-10 DIAGNOSIS — R339 Retention of urine, unspecified: Secondary | ICD-10-CM | POA: Diagnosis present

## 2020-05-10 DIAGNOSIS — D696 Thrombocytopenia, unspecified: Secondary | ICD-10-CM

## 2020-05-10 DIAGNOSIS — I8510 Secondary esophageal varices without bleeding: Secondary | ICD-10-CM | POA: Diagnosis present

## 2020-05-10 DIAGNOSIS — H269 Unspecified cataract: Secondary | ICD-10-CM | POA: Diagnosis present

## 2020-05-10 DIAGNOSIS — A4159 Other Gram-negative sepsis: Principal | ICD-10-CM | POA: Diagnosis present

## 2020-05-10 DIAGNOSIS — D6959 Other secondary thrombocytopenia: Secondary | ICD-10-CM | POA: Diagnosis present

## 2020-05-10 DIAGNOSIS — R197 Diarrhea, unspecified: Secondary | ICD-10-CM | POA: Diagnosis present

## 2020-05-10 DIAGNOSIS — K5790 Diverticulosis of intestine, part unspecified, without perforation or abscess without bleeding: Secondary | ICD-10-CM | POA: Diagnosis present

## 2020-05-10 DIAGNOSIS — N179 Acute kidney failure, unspecified: Secondary | ICD-10-CM | POA: Diagnosis present

## 2020-05-10 DIAGNOSIS — R338 Other retention of urine: Secondary | ICD-10-CM | POA: Diagnosis present

## 2020-05-10 DIAGNOSIS — G8929 Other chronic pain: Secondary | ICD-10-CM | POA: Diagnosis present

## 2020-05-10 DIAGNOSIS — M25519 Pain in unspecified shoulder: Secondary | ICD-10-CM | POA: Diagnosis not present

## 2020-05-10 DIAGNOSIS — R072 Precordial pain: Secondary | ICD-10-CM | POA: Diagnosis not present

## 2020-05-10 DIAGNOSIS — B3781 Candidal esophagitis: Secondary | ICD-10-CM | POA: Diagnosis present

## 2020-05-10 DIAGNOSIS — N390 Urinary tract infection, site not specified: Secondary | ICD-10-CM

## 2020-05-10 DIAGNOSIS — D539 Nutritional anemia, unspecified: Secondary | ICD-10-CM | POA: Diagnosis present

## 2020-05-10 DIAGNOSIS — N401 Enlarged prostate with lower urinary tract symptoms: Secondary | ICD-10-CM | POA: Diagnosis present

## 2020-05-10 DIAGNOSIS — I10 Essential (primary) hypertension: Secondary | ICD-10-CM | POA: Diagnosis present

## 2020-05-10 DIAGNOSIS — R161 Splenomegaly, not elsewhere classified: Secondary | ICD-10-CM | POA: Diagnosis present

## 2020-05-10 DIAGNOSIS — M549 Dorsalgia, unspecified: Secondary | ICD-10-CM | POA: Diagnosis present

## 2020-05-10 LAB — CBC WITH DIFFERENTIAL/PLATELET
Abs Immature Granulocytes: 0.18 10*3/uL — ABNORMAL HIGH (ref 0.00–0.07)
Basophils Absolute: 0.1 10*3/uL (ref 0.0–0.1)
Basophils Relative: 0 %
Eosinophils Absolute: 0 10*3/uL (ref 0.0–0.5)
Eosinophils Relative: 0 %
HCT: 41.9 % (ref 39.0–52.0)
Hemoglobin: 13.8 g/dL (ref 13.0–17.0)
Immature Granulocytes: 2 %
Lymphocytes Relative: 7 %
Lymphs Abs: 0.8 10*3/uL (ref 0.7–4.0)
MCH: 33.7 pg (ref 26.0–34.0)
MCHC: 32.9 g/dL (ref 30.0–36.0)
MCV: 102.2 fL — ABNORMAL HIGH (ref 80.0–100.0)
Monocytes Absolute: 0.9 10*3/uL (ref 0.1–1.0)
Monocytes Relative: 7 %
Neutro Abs: 9.9 10*3/uL — ABNORMAL HIGH (ref 1.7–7.7)
Neutrophils Relative %: 84 %
Platelets: 50 10*3/uL — ABNORMAL LOW (ref 150–400)
RBC: 4.1 MIL/uL — ABNORMAL LOW (ref 4.22–5.81)
RDW: 15.8 % — ABNORMAL HIGH (ref 11.5–15.5)
Smear Review: NORMAL
WBC: 11.7 10*3/uL — ABNORMAL HIGH (ref 4.0–10.5)
nRBC: 0 % (ref 0.0–0.2)

## 2020-05-10 LAB — COMPREHENSIVE METABOLIC PANEL
ALT: 27 U/L (ref 0–44)
AST: 47 U/L — ABNORMAL HIGH (ref 15–41)
Albumin: 3.1 g/dL — ABNORMAL LOW (ref 3.5–5.0)
Alkaline Phosphatase: 141 U/L — ABNORMAL HIGH (ref 38–126)
Anion gap: 11 (ref 5–15)
BUN: 23 mg/dL (ref 8–23)
CO2: 21 mmol/L — ABNORMAL LOW (ref 22–32)
Calcium: 8.9 mg/dL (ref 8.9–10.3)
Chloride: 103 mmol/L (ref 98–111)
Creatinine, Ser: 1.54 mg/dL — ABNORMAL HIGH (ref 0.61–1.24)
GFR calc Af Amer: 50 mL/min — ABNORMAL LOW (ref >60)
GFR calc non Af Amer: 43 mL/min — ABNORMAL LOW (ref >60)
Glucose, Bld: 174 mg/dL — ABNORMAL HIGH (ref 70–99)
Potassium: 4.9 mmol/L (ref 3.5–5.1)
Sodium: 135 mmol/L (ref 135–145)
Total Bilirubin: 1.8 mg/dL — ABNORMAL HIGH (ref 0.3–1.2)
Total Protein: 7.8 g/dL (ref 6.5–8.1)

## 2020-05-10 LAB — URINALYSIS, ROUTINE W REFLEX MICROSCOPIC
Bilirubin Urine: NEGATIVE
Glucose, UA: NEGATIVE mg/dL
Hgb urine dipstick: NEGATIVE
Ketones, ur: NEGATIVE mg/dL
Nitrite: NEGATIVE
Protein, ur: 30 mg/dL — AB
Specific Gravity, Urine: 1.019 (ref 1.005–1.030)
pH: 5 (ref 5.0–8.0)

## 2020-05-10 LAB — PROTIME-INR
INR: 1.6 — ABNORMAL HIGH (ref 0.8–1.2)
Prothrombin Time: 18.6 seconds — ABNORMAL HIGH (ref 11.4–15.2)

## 2020-05-10 LAB — LACTIC ACID, PLASMA
Lactic Acid, Venous: 4.2 mmol/L (ref 0.5–1.9)
Lactic Acid, Venous: 3.8 mmol/L (ref 0.5–1.9)

## 2020-05-10 LAB — APTT: aPTT: 39 seconds — ABNORMAL HIGH (ref 24–36)

## 2020-05-10 LAB — SARS CORONAVIRUS 2 BY RT PCR (HOSPITAL ORDER, PERFORMED IN ~~LOC~~ HOSPITAL LAB): SARS Coronavirus 2: NEGATIVE

## 2020-05-10 MED ORDER — LACTATED RINGERS IV BOLUS
1000.0000 mL | Freq: Once | INTRAVENOUS | Status: AC
  Administered 2020-05-10: 1000 mL via INTRAVENOUS

## 2020-05-10 MED ORDER — SODIUM CHLORIDE 0.9 % IV SOLN
1.0000 g | Freq: Once | INTRAVENOUS | Status: AC
  Administered 2020-05-10: 1 g via INTRAVENOUS
  Filled 2020-05-10: qty 10

## 2020-05-10 MED ORDER — LACTATED RINGERS IV BOLUS
2000.0000 mL | Freq: Once | INTRAVENOUS | Status: AC
  Administered 2020-05-10: 2000 mL via INTRAVENOUS

## 2020-05-10 MED ORDER — ONDANSETRON HCL 4 MG/2ML IJ SOLN
4.0000 mg | Freq: Once | INTRAMUSCULAR | Status: AC
  Administered 2020-05-10: 4 mg via INTRAVENOUS
  Filled 2020-05-10: qty 2

## 2020-05-10 MED ORDER — ACETAMINOPHEN 325 MG PO TABS
650.0000 mg | ORAL_TABLET | Freq: Once | ORAL | Status: AC
  Administered 2020-05-10: 650 mg via ORAL
  Filled 2020-05-10: qty 2

## 2020-05-10 MED ORDER — SODIUM CHLORIDE 0.9 % IV BOLUS: 1000.0000 mL | Freq: Once | INTRAVENOUS | Status: DC

## 2020-05-10 MED ORDER — LACTATED RINGERS IV BOLUS: 1000.0000 mL | Freq: Once | INTRAVENOUS | Status: DC

## 2020-05-10 MED ORDER — ACETAMINOPHEN 325 MG RE SUPP
325.0000 mg | Freq: Once | RECTAL | Status: AC
  Administered 2020-05-10: 325 mg via RECTAL
  Filled 2020-05-10: qty 1

## 2020-05-10 MED ORDER — SODIUM CHLORIDE 0.9 % IV SOLN
500.0000 mg | Freq: Once | INTRAVENOUS | Status: AC
  Administered 2020-05-10: 500 mg via INTRAVENOUS
  Filled 2020-05-10: qty 500

## 2020-05-10 NOTE — ED Provider Notes (Signed)
I assumed care at signout to f/u on patient Pt is septic, source likely urine vs. Intra-abdominal source Pt is awake/alert but appears confused Neck is supple He has diffuse abdominal tenderness CT imaging pending then admit    Ripley Fraise, MD 05/10/20 2357

## 2020-05-10 NOTE — ED Triage Notes (Signed)
The son at bedside speaks broken english and all he will say is that the pt is old and is changing  No vomiting  The pt stopped talking 2 days ago   That's all the sone knows

## 2020-05-10 NOTE — ED Provider Notes (Signed)
[facility] EMERGENCY DEPARTMENT Provider Note   CSN: [id number] Arrival date & time: 05/10/20  1932     History Chief Complaint  Patient presents with  . Altered Mental Status    Jim Little is a 74 y.o. male.  Level 5 caveat due to altered mental status.  History obtained from son.  Patient started to not feel well yesterday.  Possibly abdominal pain.  Has been having some frequent urination.  Patient with fever upon arrival but son does not know if patient had fever yesterday.  Patient follows with primary care locally.  Has a history of cirrhosis and BPH.  Otherwise he is pretty healthy.  He has not had any diarrhea but has had some nausea.  Patient has not been complaining of any headaches.  Has not been a virus.  The history is provided by a caregiver and the patient. A language interpreter was used.  Altered Mental Status Presenting symptoms: lethargy   Severity:  Mild Most recent episode:  Yesterday Timing:  Constant Progression:  Worsening Chronicity:  New Associated symptoms: abdominal pain        Past Medical History:  Diagnosis Date  . BPH (benign prostatic hyperplasia)   . Cirrhosis ([place])   . Splenomegaly     Patient Active Problem List   Diagnosis Date Noted  . Bilateral cataracts 07/15/2018  . Refugee health examination 04/16/2018  . Splenomegaly 04/16/2018  . Chronic back pain 04/16/2018  . Hepatic cirrhosis ([employer]) 04/16/2018  . Lower urinary tract symptoms (LUTS) 04/16/2018  . HTN (hypertension) 04/16/2018    Past Surgical History:  Procedure Laterality Date  . UPPER GASTROINTESTINAL ENDOSCOPY  03/2018   [place] --candidiasis plus esophageal varices grade 2.  Treated with fluconazole and status post esophageal banding.  Marland Kitchen US ECHOCARDIOGRAPHY  10/2016   per overseas records from [place] - LVEF 68%; mildly calcified aortic valve, normal function otherwise       No family history on file.  Social History    Tobacco Use  . Smoking status: Never Smoker  . Smokeless tobacco: Never Used  Substance Use Topics  . Alcohol use: Never  . Drug use: Not on file    Home Medications Prior to Admission medications   Medication Sig Start Date End Date Taking? Authorizing Provider  azelastine (OPTIVAR) 0.05 % ophthalmic solution Place 1 drop into both eyes 2 (two) times daily. 01/31/20   [name], MD  fluticasone (FLONASE) 50 MCG/ACT nasal spray Place 2 sprays into both nostrils daily. 03/06/20   [name], MD  Olopatadine HCl 0.2 % SOLN Apply 1 drop to eye daily. 03/06/20   [name], MD  tamsulosin (FLOMAX) 0.4 MG CAPS capsule Take 1 capsule (0.4 mg total) by mouth daily after supper. 01/31/20   [name], MD  traMADol (ULTRAM) 50 MG tablet Take 1 tablet (50 mg total) by mouth every 8 (eight) hours as needed. 07/15/18   Alveda Reasons, MD    Allergies    Patient has no known allergies.  Review of Systems   Review of Systems  Unable to perform ROS: Mental status change  Gastrointestinal: Positive for abdominal pain.    Physical Exam Updated Vital Signs  ED Triage Vitals  Enc Vitals Group     BP 05/10/20 1941 (!) 181/75     Pulse Rate 05/10/20 1941 (!) 109     Resp 05/10/20 1941 14     Temp 05/10/20 1941 (!) 102.9 F (  39.4 C)     Temp Source 05/10/20 1941 Oral     SpO2 05/10/20 1941 95 %     Weight 05/10/20 1948 145 lb 1 oz (65.8 kg)     Height 05/10/20 1948 5\' 6"  (1.676 m)     Head Circumference --      Peak Flow --      Pain Score 05/10/20 1948 0     Pain Loc --      Pain Edu? --      Excl. in [place]? --     Physical Exam Vitals and nursing note reviewed.  Constitutional:      General: He is in acute distress.     Appearance: He is well-developed. He is ill-appearing.  HENT:     Head: Normocephalic and atraumatic.     Nose: Nose normal.     Mouth/Throat:     Mouth: Mucous membranes are moist.  Eyes:     Extraocular Movements: Extraocular movements  intact.     Conjunctiva/sclera: Conjunctivae normal.     Pupils: Pupils are equal, round, and reactive to light.  Cardiovascular:     Rate and Rhythm: Regular rhythm. Tachycardia present.     Pulses: Normal pulses.     Heart sounds: Normal heart sounds. No murmur heard.   Pulmonary:     Effort: Pulmonary effort is normal. No respiratory distress.     Breath sounds: Normal breath sounds.  Abdominal:     General: There is distension.     Palpations: Abdomen is soft.     Tenderness: There is abdominal tenderness. There is guarding.  Musculoskeletal:        General: No tenderness.     Cervical back: Normal range of motion and neck supple. No rigidity.  Skin:    General: Skin is warm and dry.     Capillary Refill: Capillary refill takes less than 2 seconds.  Neurological:     General: No focal deficit present.     Mental Status: He is alert.     ED Results / Procedures / Treatments   Labs (all labs ordered are listed, but only abnormal results are displayed) Labs Reviewed  COMPREHENSIVE METABOLIC PANEL - Abnormal; Notable for the following components:      Result Value   CO2 21 (*)    Glucose, Bld 174 (*)    Creatinine, Ser 1.54 (*)    Albumin 3.1 (*)    AST 47 (*)    Alkaline Phosphatase 141 (*)    Total Bilirubin 1.8 (*)    GFR calc non Af Amer 43 (*)    GFR calc Af Amer 50 (*)    All other components within normal limits  LACTIC ACID, PLASMA - Abnormal; Notable for the following components:   Lactic Acid, Venous 4.2 (*)    All other components within normal limits  LACTIC ACID, PLASMA - Abnormal; Notable for the following components:   Lactic Acid, Venous 3.8 (*)    All other components within normal limits  CBC WITH DIFFERENTIAL/PLATELET - Abnormal; Notable for the following components:   WBC 11.7 (*)    RBC 4.10 (*)    MCV 102.2 (*)    RDW 15.8 (*)    Platelets 50 (*)    Neutro Abs 9.9 (*)    Abs Immature Granulocytes 0.18 (*)    All other components within  normal limits  PROTIME-INR - Abnormal; Notable for the following components:   Prothrombin Time 18.6 (*)  INR 1.6 (*)    All other components within normal limits  URINALYSIS, ROUTINE W REFLEX MICROSCOPIC - Abnormal; Notable for the following components:   APPearance HAZY (*)    Protein, ur 30 (*)    Leukocytes,Ua MODERATE (*)    Bacteria, UA FEW (*)    Non Squamous Epithelial 0-5 (*)    All other components within normal limits  APTT - Abnormal; Notable for the following components:   aPTT 39 (*)    All other components within normal limits  SARS CORONAVIRUS 2 BY RT PCR (HOSPITAL ORDER, [place])  CULTURE, BLOOD (ROUTINE X 2)  CULTURE, BLOOD (ROUTINE X 2)  URINE CULTURE    EKG EKG Interpretation  Date/Time:  Wednesday May 10 2020 19:48:07 EDT Ventricular Rate:  106 PR Interval:  160 QRS Duration: 68 QT Interval:  310 QTC Calculation: 411 R Axis:   28 Text Interpretation: Sinus tachycardia Nonspecific T wave abnormality Abnormal ECG Confirmed by Lennice Sites [phone]) on 05/10/2020 8:40:59 PM   Radiology DG Chest 2 View  Result Date: 05/10/2020 CLINICAL DATA:  Suspected sepsis,. Speaking and responding 2 days prior, poor historian EXAM: CHEST - 2 VIEW COMPARISON:  Radiograph 05/01/2018, CT 07/22/2018 FINDINGS: Streaky changes in the lung bases likely reflecting atelectasis with mild central vascular crowding. No consolidation, features of edema, pneumothorax, or effusion. Cardiomediastinal contours are unremarkable for the portable technique. Lucency of right subdiaphragmatic air is likely related to an air-filled loop of colon seen on lateral radiograph. No convincing evidence of free subdiaphragmatic air. No acute osseous abnormality. Degenerative changes are present in the imaged spine and shoulders. IMPRESSION: Streaky changes in the lung bases likely reflecting atelectasis. No other acute cardiopulmonary abnormality. Electronically Signed   By:  Lovena Le M.D.   On: 05/10/2020 20:30    Procedures .Critical Care Performed by: Lennice Sites, DO Authorized by: Lennice Sites, DO   Critical care provider statement:    Critical care time (minutes):  45   Critical care was necessary to treat or prevent imminent or life-threatening deterioration of the following conditions:  Sepsis   Critical care was time spent personally by me on the following activities:  Blood draw for specimens, development of treatment plan with patient or surrogate, evaluation of patient's response to treatment, examination of patient, obtaining history from patient or surrogate, ordering and performing treatments and interventions, ordering and review of radiographic studies, ordering and review of laboratory studies, pulse oximetry, re-evaluation of patient's condition and review of old charts   I assumed direction of critical care for this patient from another provider in my specialty: no     (including critical care time)  Medications Ordered in ED Medications  acetaminophen (TYLENOL) tablet 650 mg (650 mg Oral Given 05/10/20 2003)  azithromycin (ZITHROMAX) 500 mg in sodium chloride 0.9 % 250 mL IVPB (0 mg Intravenous Stopped 05/10/20 2247)  cefTRIAXone (ROCEPHIN) 1 g in sodium chloride 0.9 % 100 mL IVPB (0 g Intravenous Stopped 05/10/20 2319)  acetaminophen (TYLENOL) suppository 325 mg (325 mg Rectal Given 05/10/20 2139)  lactated ringers bolus 2,000 mL (2,000 mLs Intravenous Bolus from Bag 05/10/20 2234)  lactated ringers bolus 1,000 mL (1,000 mLs Intravenous Bolus from Bag 05/10/20 2317)  ondansetron (ZOFRAN) injection 4 mg (4 mg Intravenous Given 05/10/20 2324)    ED Course  I have reviewed the triage vital signs and the nursing notes.  Pertinent labs & imaging results that were available during my care of the patient  were reviewed by me and considered in my medical decision making (see chart for details).    MDM Rules/Calculators/A&P                           Jim Little is a 74 year old male with history of BPH, cirrhosis who presents to the ED with altered mental status.  Patient with fever, tachycardia, hypertension.  Patient has not been feeling well for the last 2 days per his son.  Patient appears encephalopathic and difficult to obtain history.  Supposedly he has had some abdominal pain, nausea.  No diarrhea.  Patient moved here from [place] several years ago.  Follows with family medicine.  Chart review shows overall unremarkable medical history.  Chart says history of cirrhosis but hepatitis panel has been negative in the past.  HIV negative in the past as well.  Patient appears to have abdominal discomfort on exam with guarding and some distention.  Does not appear to have any skin wounds.  Overall clear breath sounds.  Patient's son denies any cough or sputum production.  He is not vaccinated against coronavirus.  He has not complained much about headache or neck pain.  Overall he appears to have sepsis.  Possibly intra-abdominal versus UTI.  Will give fluid bolus, empiric antibiotics.  Will give Tylenol.  Patient with lactic acid of 4.2.  Therefore, 30 cc/kg of IV fluids have been given.  Repeat lactic acid was done before patient had a liter of fluid as patient had lab work done well before he was bedded to the room.  Will trend lactic acid after IV fluids have finished.  Urinalysis appears possibly infected.  Urine appears to have some dark hue to it.  Patient with mild leukocytosis.  Has AKI with creatinine of 1.5.  Chest x-ray with no obvious infection.  Upon reevaluation patient appears more alert.  Appears to still have some abdominal discomfort.  Awaiting CT scan of his head and abdomen pelvis dissipate admission for further sepsis care.  Likely urinary/intra-abdominal source.  Lesser concern for meningitis.  Covid negative.  Hemodynamics are stable.  Handed off to Dr. Christy Gentles with patient pending CT imaging.   This chart was dictated using  voice recognition software.  Despite best efforts to proofread,  errors can occur which can change the documentation meaning.   Final Clinical Impression(s) / ED Diagnoses Final diagnoses:  Sepsis, due to unspecified organism, unspecified whether acute organ dysfunction present [facility])  Urinary tract infection without hematuria, site unspecified    Rx / DC Orders ED Discharge Orders    None       Lennice Sites, DO 05/11/20 0005

## 2020-05-11 ENCOUNTER — Inpatient Hospital Stay (HOSPITAL_COMMUNITY): Payer: Medicaid Other

## 2020-05-11 ENCOUNTER — Emergency Department (HOSPITAL_COMMUNITY): Payer: Medicaid Other

## 2020-05-11 DIAGNOSIS — R339 Retention of urine, unspecified: Secondary | ICD-10-CM | POA: Diagnosis not present

## 2020-05-11 DIAGNOSIS — M25519 Pain in unspecified shoulder: Secondary | ICD-10-CM | POA: Diagnosis not present

## 2020-05-11 DIAGNOSIS — M549 Dorsalgia, unspecified: Secondary | ICD-10-CM | POA: Diagnosis present

## 2020-05-11 DIAGNOSIS — G8929 Other chronic pain: Secondary | ICD-10-CM | POA: Diagnosis present

## 2020-05-11 DIAGNOSIS — R161 Splenomegaly, not elsewhere classified: Secondary | ICD-10-CM

## 2020-05-11 DIAGNOSIS — K7469 Other cirrhosis of liver: Secondary | ICD-10-CM

## 2020-05-11 DIAGNOSIS — K7460 Unspecified cirrhosis of liver: Secondary | ICD-10-CM | POA: Diagnosis present

## 2020-05-11 DIAGNOSIS — R338 Other retention of urine: Secondary | ICD-10-CM | POA: Diagnosis present

## 2020-05-11 DIAGNOSIS — K5790 Diverticulosis of intestine, part unspecified, without perforation or abscess without bleeding: Secondary | ICD-10-CM | POA: Diagnosis present

## 2020-05-11 DIAGNOSIS — R399 Unspecified symptoms and signs involving the genitourinary system: Secondary | ICD-10-CM | POA: Diagnosis not present

## 2020-05-11 DIAGNOSIS — A498 Other bacterial infections of unspecified site: Secondary | ICD-10-CM | POA: Diagnosis not present

## 2020-05-11 DIAGNOSIS — B3781 Candidal esophagitis: Secondary | ICD-10-CM | POA: Diagnosis present

## 2020-05-11 DIAGNOSIS — A419 Sepsis, unspecified organism: Secondary | ICD-10-CM | POA: Diagnosis present

## 2020-05-11 DIAGNOSIS — N390 Urinary tract infection, site not specified: Secondary | ICD-10-CM | POA: Diagnosis not present

## 2020-05-11 DIAGNOSIS — N401 Enlarged prostate with lower urinary tract symptoms: Secondary | ICD-10-CM | POA: Diagnosis present

## 2020-05-11 DIAGNOSIS — G92 Toxic encephalopathy: Secondary | ICD-10-CM | POA: Diagnosis present

## 2020-05-11 DIAGNOSIS — Z20822 Contact with and (suspected) exposure to covid-19: Secondary | ICD-10-CM | POA: Diagnosis present

## 2020-05-11 DIAGNOSIS — R7881 Bacteremia: Secondary | ICD-10-CM | POA: Diagnosis not present

## 2020-05-11 DIAGNOSIS — R072 Precordial pain: Secondary | ICD-10-CM | POA: Diagnosis not present

## 2020-05-11 DIAGNOSIS — D6959 Other secondary thrombocytopenia: Secondary | ICD-10-CM | POA: Diagnosis present

## 2020-05-11 DIAGNOSIS — D696 Thrombocytopenia, unspecified: Secondary | ICD-10-CM

## 2020-05-11 DIAGNOSIS — H269 Unspecified cataract: Secondary | ICD-10-CM | POA: Diagnosis present

## 2020-05-11 DIAGNOSIS — I8510 Secondary esophageal varices without bleeding: Secondary | ICD-10-CM | POA: Diagnosis present

## 2020-05-11 DIAGNOSIS — A4159 Other Gram-negative sepsis: Secondary | ICD-10-CM | POA: Diagnosis present

## 2020-05-11 DIAGNOSIS — R197 Diarrhea, unspecified: Secondary | ICD-10-CM | POA: Diagnosis present

## 2020-05-11 DIAGNOSIS — D539 Nutritional anemia, unspecified: Secondary | ICD-10-CM | POA: Diagnosis present

## 2020-05-11 DIAGNOSIS — N179 Acute kidney failure, unspecified: Secondary | ICD-10-CM | POA: Diagnosis present

## 2020-05-11 DIAGNOSIS — R911 Solitary pulmonary nodule: Secondary | ICD-10-CM | POA: Diagnosis present

## 2020-05-11 DIAGNOSIS — I10 Essential (primary) hypertension: Secondary | ICD-10-CM | POA: Diagnosis present

## 2020-05-11 LAB — CBC
HCT: 36.7 % — ABNORMAL LOW (ref 39.0–52.0)
Hemoglobin: 12.5 g/dL — ABNORMAL LOW (ref 13.0–17.0)
MCH: 33.7 pg (ref 26.0–34.0)
MCHC: 34.1 g/dL (ref 30.0–36.0)
MCV: 98.9 fL (ref 80.0–100.0)
Platelets: 37 10*3/uL — ABNORMAL LOW (ref 150–400)
RBC: 3.71 MIL/uL — ABNORMAL LOW (ref 4.22–5.81)
RDW: 15.6 % — ABNORMAL HIGH (ref 11.5–15.5)
WBC: 9.7 10*3/uL (ref 4.0–10.5)
nRBC: 0 % (ref 0.0–0.2)

## 2020-05-11 LAB — BLOOD CULTURE ID PANEL (REFLEXED)

## 2020-05-11 LAB — COMPREHENSIVE METABOLIC PANEL
ALT: 23 U/L (ref 0–44)
AST: 40 U/L (ref 15–41)
Albumin: 2.6 g/dL — ABNORMAL LOW (ref 3.5–5.0)
Alkaline Phosphatase: 103 U/L (ref 38–126)
Anion gap: 8 (ref 5–15)
BUN: 19 mg/dL (ref 8–23)
CO2: 22 mmol/L (ref 22–32)
Calcium: 8.4 mg/dL — ABNORMAL LOW (ref 8.9–10.3)
Chloride: 105 mmol/L (ref 98–111)
Creatinine, Ser: 1.23 mg/dL (ref 0.61–1.24)
GFR calc Af Amer: >60 mL/min (ref >60)
GFR calc non Af Amer: 57 mL/min — ABNORMAL LOW (ref >60)
Glucose, Bld: 109 mg/dL — ABNORMAL HIGH (ref 70–99)
Potassium: 4.3 mmol/L (ref 3.5–5.1)
Sodium: 135 mmol/L (ref 135–145)
Total Bilirubin: 1.7 mg/dL — ABNORMAL HIGH (ref 0.3–1.2)
Total Protein: 6.6 g/dL (ref 6.5–8.1)

## 2020-05-11 LAB — LACTIC ACID, PLASMA
Lactic Acid, Venous: 4.1 mmol/L (ref 0.5–1.9)
Lactic Acid, Venous: 2.7 mmol/L (ref 0.5–1.9)
Lactic Acid, Venous: 3 mmol/L (ref 0.5–1.9)

## 2020-05-11 LAB — URINE CULTURE: Culture: NO GROWTH

## 2020-05-11 LAB — AMMONIA: Ammonia: 35 umol/L (ref 9–35)

## 2020-05-11 LAB — PROTIME-INR
INR: 1.6 — ABNORMAL HIGH (ref 0.8–1.2)
Prothrombin Time: 18.1 seconds — ABNORMAL HIGH (ref 11.4–15.2)

## 2020-05-11 LAB — SAVE SMEAR(SSMR), FOR PROVIDER SLIDE REVIEW

## 2020-05-11 MED ORDER — TAMSULOSIN HCL 0.4 MG PO CAPS
0.4000 mg | ORAL_CAPSULE | Freq: Every day | ORAL | Status: DC
  Administered 2020-05-11 – 2020-05-14 (×4): 0.4 mg via ORAL
  Filled 2020-05-11 (×4): qty 1

## 2020-05-11 MED ORDER — VANCOMYCIN HCL IN DEXTROSE 1-5 GM/200ML-% IV SOLN: 1000.0000 mg | INTRAVENOUS | Status: DC

## 2020-05-11 MED ORDER — ACETAMINOPHEN 650 MG RE SUPP: 650.0000 mg | Freq: Four times a day (QID) | RECTAL | Status: DC | PRN

## 2020-05-11 MED ORDER — LORAZEPAM 2 MG/ML IJ SOLN
0.5000 mg | Freq: Once | INTRAMUSCULAR | Status: AC
  Administered 2020-05-11: 0.5 mg via INTRAVENOUS
  Filled 2020-05-11: qty 1

## 2020-05-11 MED ORDER — SODIUM CHLORIDE 0.9 % IV SOLN: INTRAVENOUS | Status: DC

## 2020-05-11 MED ORDER — ACETAMINOPHEN 650 MG RE SUPP
650.0000 mg | Freq: Once | RECTAL | Status: AC
  Administered 2020-05-11: 650 mg via RECTAL
  Filled 2020-05-11: qty 1

## 2020-05-11 MED ORDER — SODIUM CHLORIDE 0.9 % IV SOLN
2.0000 g | Freq: Two times a day (BID) | INTRAVENOUS | Status: DC
  Administered 2020-05-11: 2 g via INTRAVENOUS
  Filled 2020-05-11 (×2): qty 2

## 2020-05-11 MED ORDER — LIDOCAINE HCL URETHRAL/MUCOSAL 2 % EX GEL
1.0000 "application " | Freq: Once | CUTANEOUS | Status: AC
  Administered 2020-05-11: 1 via URETHRAL
  Filled 2020-05-11 (×4): qty 11

## 2020-05-11 MED ORDER — VANCOMYCIN HCL 1250 MG/250ML IV SOLN
1250.0000 mg | INTRAVENOUS | Status: DC
  Administered 2020-05-11: 1250 mg via INTRAVENOUS
  Filled 2020-05-11: qty 250

## 2020-05-11 MED ORDER — ACETAMINOPHEN 325 MG PO TABS
650.0000 mg | ORAL_TABLET | Freq: Four times a day (QID) | ORAL | Status: DC | PRN
  Administered 2020-05-12 – 2020-05-13 (×2): 650 mg via ORAL
  Filled 2020-05-11 (×2): qty 2

## 2020-05-11 MED ORDER — SODIUM CHLORIDE 0.9 % IV SOLN
2.0000 g | INTRAVENOUS | Status: AC
  Administered 2020-05-11 – 2020-05-14 (×4): 2 g via INTRAVENOUS
  Filled 2020-05-11 (×4): qty 2

## 2020-05-11 NOTE — ED Provider Notes (Signed)
CT imaging is complete Patient is resting comfortably.  No acute findings on CT imaging.  Discussed the case with family medicine to admit the patient.  I have updated son.   Ripley Fraise, MD 05/11/20 [phone]

## 2020-05-11 NOTE — Progress Notes (Signed)
Discussed case with CCM due to patient's worsening lactic acid.  Provider recommends switching to urosepsis antibiotics and continuing hydration.  Recommends frequent neurochecks and if GCS drops to 8 or below the patient may need ICU care but does not needed at this time.  We greatly appreciate their help.

## 2020-05-11 NOTE — Progress Notes (Addendum)
Coude cath placement by coude cath team unsuccessful. Bladder scan shoes 356cc in bladder after recent incontinent void. MD paged.  Orders received for frequent bladder scan. Will continue to monitor.  Clyde Canterbury, RN

## 2020-05-11 NOTE — Progress Notes (Signed)
CRITICAL VALUE ALERT  Critical Value:  Lactic Acid 3.0  Date & Time Notied:  05/11/20 0746  Provider Notified: Smith Mince MD

## 2020-05-11 NOTE — Hospital Course (Addendum)
Consider outpatient CT chest to further evaluate small calcified nodule in right lung base noted on CT Abd/Pelvis    IMPRESSION: 1. Geographic lesion in the inferior medial aspect of the RIGHT hepatic lobe has some imaging features suggesting focal fatty infiltration. However, atypical enhancement pattern. Recommend follow-up contrast MRI in 3 to 6 months to re-evaluate. 2. No additional lesion liver. 3. Morphologic changes consistent with cirrhosis   -Repeat contrasted MRI 3 to 6 months to reevaluate liver lesions  Lung nodule Small calcified nodule seen at right lung base with CT abd/pelvis. - consider outpatient CT chest to further evaluate

## 2020-05-11 NOTE — Evaluation (Signed)
Occupational Therapy Evaluation Patient Details Name: Jim Little MRN: [id number] DOB: 09/01/1946 Today's Date: 05/11/2020    History of Present Illness 74 yo male with onset of AMS and urosepsis was admitted, also with elevated lactic acid, thrombocytopenia, AKI, encephalopathy, and new lung nodule.  Per pt's son, he was fully independent at baseline, but son could not answer questions about prior level for eval.  PMHx:  UTI, HTN, chronic back pain, cirrhosis, cataracts, splenomegaly,    Clinical Impression   Pt admitted with above diagnoses, presenting with decreased cognition, activity tolerance and overall safety and ability to perform BADL. Swahili translator used for session. At time of eval, pt able to complete bed mobility at min- mod A and sit <> stands with min - mod A and RW. Pt completed functional mobility to bathroom with RW and was incontinent of urine while walking without noticing. Pt then completing LB bathing and dressing with mod A while seated on toilet to clean self up from incontinence episode. Pt was not able to identify or problem solve use of sock despite multimodal cues. Given current status, recommend SNF at d/c for continued safe progression of BADL prior to d/c home. Will continue to follow per POC listed below.     Follow Up Recommendations  SNF;Supervision/Assistance - 24 hour    Equipment Recommendations  3 in 1 bedside commode    Recommendations for Other Services       Precautions / Restrictions Precautions Precautions: Fall Restrictions Weight Bearing Restrictions: No      Mobility Bed Mobility Overal bed mobility: Needs Assistance Bed Mobility: Supine to Sit;Sit to Supine     Supine to sit: Mod assist Sit to supine: Min assist   General bed mobility comments: mod A to support trunk and come sitting at EOB. Min A to support BLEs back to bed  Transfers Overall transfer level: Needs assistance Equipment used: Rolling walker (2  wheeled) Transfers: Sit to/from Stand Sit to Stand: Min assist;Mod assist;+2 physical assistance         General transfer comment: min-mod A depending on surface height    Balance Overall balance assessment: Needs assistance Sitting-balance support: Feet supported;Bilateral upper extremity supported Sitting balance-Leahy Scale: Fair     Standing balance support: Bilateral upper extremity supported;During functional activity Standing balance-Leahy Scale: Poor Standing balance comment: reliant on external support                           ADL either performed or assessed with clinical judgement   ADL Overall ADL's : Needs assistance/impaired Eating/Feeding: Set up;Sitting   Grooming: Set up;Sitting   Upper Body Bathing: Moderate assistance;Sitting;Cueing for safety;Cueing for sequencing   Lower Body Bathing: Moderate assistance;Sit to/from stand;Sitting/lateral leans;Cueing for safety;Cueing for sequencing   Upper Body Dressing : Moderate assistance;Sitting   Lower Body Dressing: Moderate assistance;Sit to/from stand;Sitting/lateral leans;Cueing for safety;Cueing for sequencing Lower Body Dressing Details (indicate cue type and reason): when presented with a sock, pt states "is this a sock" and could not problem solve how to put on sock despite instruction and cues Toilet Transfer: Minimal assistance;RW;+2 for safety/equipment;Cueing for safety;Cueing for sequencing;Ambulation;Regular Toilet;Grab bars Toilet Transfer Details (indicate cue type and reason): pt completed functional mobility to bathroom in room, incontinent along the way. Toileting- Clothing Manipulation and Hygiene: Maximal assistance;Sit to/from stand Toileting - Clothing Manipulation Details (indicate cue type and reason): unaware of incontinent, attempting to walk away from toilet after BM without intitiating hygiene  Functional mobility during ADLs: Minimal assistance;+2 for physical  assistance;+2 for safety/equipment;Rolling walker       Vision Patient Visual Report: No change from baseline       Perception     Praxis      Pertinent Vitals/Pain Pain Assessment: No/denies pain     Hand Dominance     Extremity/Trunk Assessment Upper Extremity Assessment Upper Extremity Assessment: Generalized weakness   Lower Extremity Assessment Lower Extremity Assessment: Generalized weakness       Communication Communication Communication: Prefers language other than [place] (Swahili interpreter [name] [phone])   Cognition Arousal/Alertness: Awake/alert Behavior During Therapy: Flat affect;Impulsive Overall Cognitive Status: Impaired/Different from baseline Area of Impairment: Orientation;Attention;Memory;Following commands;Safety/judgement;Awareness;Problem solving                 Orientation Level: Disoriented to;Place;Time;Situation Current Attention Level: Focused Memory: Decreased short-term memory Following Commands: Follows one step commands inconsistently;Follows one step commands with increased time Safety/Judgement: Decreased awareness of safety;Decreased awareness of deficits Awareness: Intellectual Problem Solving: Slow processing;Requires verbal cues;Requires tactile cues;Decreased initiation;Difficulty sequencing General Comments: translator used so difficult to make full assessment. Pt needing frequent cues and cues to be rephrased. Not able to recall home set up. Is overall disoriented and confused   General Comments       Exercises     Shoulder Instructions      Home Living Family/patient expects to be discharged to:: Private residence   Available Help at Discharge: Family                             Additional Comments: pt not able to answer questions about home set up at this time      Prior Functioning/Environment Level of Independence: Independent        Comments: per chart review pt was independent         OT Problem List: Decreased strength;Decreased knowledge of use of DME or AE;Decreased activity tolerance;Decreased cognition;Impaired balance (sitting and/or standing);Decreased safety awareness      OT Treatment/Interventions: Self-care/ADL training;Therapeutic exercise;Patient/family education;Balance training;Energy conservation;Therapeutic activities;DME and/or AE instruction;Cognitive remediation/compensation    OT Goals(Current goals can be found in the care plan section) Acute Rehab OT Goals OT Goal Formulation: Patient unable to participate in goal setting Time For Goal Achievement: 05/25/20 Potential to Achieve Goals: Good  OT Frequency: Min 2X/week   Barriers to D/C:            Co-evaluation              AM-PAC OT "6 Clicks" Daily Activity     Outcome Measure Help from another person eating meals?: A Little Help from another person taking care of personal grooming?: A Little Help from another person toileting, which includes using toliet, bedpan, or urinal?: A Lot Help from another person bathing (including washing, rinsing, drying)?: A Lot Help from another person to put on and taking off regular upper body clothing?: A Lot Help from another person to put on and taking off regular lower body clothing?: A Lot 6 Click Score: 14   End of Session Nurse Communication: Mobility status  Activity Tolerance: Patient tolerated treatment well Patient left: in bed;with call bell/phone within reach;with restraints reapplied;with bed alarm set  OT Visit Diagnosis: Unsteadiness on feet (R26.81);Other abnormalities of gait and mobility (R26.89);Muscle weakness (generalized) (M62.81);Other symptoms and signs involving cognitive function                Time: 1250-1320  OT Time Calculation (min): 30 min Charges:  OT General Charges $OT Visit: 1 Visit OT Evaluation $OT Eval Moderate Complexity: [place], MSOT, OTR/L [place] [facility] Office  Number: [phone] Pager: [phone]  Zenovia Jarred 05/11/2020, 5:04 PM

## 2020-05-11 NOTE — Progress Notes (Signed)
Physical Therapy Evaluation Patient Details Name: Jim Little MRN: [id number] DOB: 18-Oct-1946 Today's Date: 05/11/2020   History of Present Illness  74 yo male with onset of AMS and urosepsis was admitted, also with elevated lactic acid, thrombocytopenia, AKI, encephalopathy, and new lung nodule.  Per pt's son, he was fully independent at baseline, but son could not answer questions about prior level for eval.  PMHx:  UTI, HTN, chronic back pain, cirrhosis, cataracts, splenomegaly,   Clinical Impression  Pt was seen for mobility on RW to BR and back with incontinent episode durign the walk.  Pt is in room with son, but he stepped out to make a call after translator was reached.  Pt is confused but with repetitive multimodal cues can follow instructions.  Returned to bed while translator still available to have nursing put in a foley cath.  Will follow up with expectation that PLOF was fully independent gait and transfers, and will ask for rehab stay to return to this level.      Follow Up Recommendations SNF;Supervision for mobility/OOB    Equipment Recommendations  Rolling walker with 5" wheels    Recommendations for Other Services       Precautions / Restrictions Precautions Precautions: Fall Precaution Comments: telemetry, new foley cath Restrictions Weight Bearing Restrictions: No      Mobility  Bed Mobility Overal bed mobility: Needs Assistance Bed Mobility: Supine to Sit;Sit to Supine     Supine to sit: Mod assist Sit to supine: Min assist   General bed mobility comments: mod to support trunk and power to edge of bed  Transfers Overall transfer level: Needs assistance Equipment used: Rolling walker (2 wheeled);1 person hand held assist Transfers: Sit to/from Stand Sit to Stand: +2 physical assistance;+2 safety/equipment;Min assist;Mod assist         General transfer comment: 2 min for higher surfaces and 2 mod for lower surfaces  Ambulation/Gait Ambulation/Gait  assistance: Min assist;+2 safety/equipment;+2 physical assistance (2 for lines) Gait Distance (Feet): 30 Feet (15 x 2) Assistive device: Rolling walker (2 wheeled);2 person hand held assist;1 person hand held assist Gait Pattern/deviations: Decreased stride length;Step-to pattern;Step-through pattern;Wide base of support;Trunk flexed (tends to put walker too far in front of him)        Stairs            Wheelchair Mobility    Modified Rankin (Stroke Patients Only)       Balance Overall balance assessment: Needs assistance (Pt is unable to manage lines, urine on floor as soon as he s) Sitting-balance support: Feet supported;Bilateral upper extremity supported Sitting balance-Leahy Scale: Fair Sitting balance - Comments: fair but can lean off balance too   Standing balance support: Bilateral upper extremity supported;During functional activity Standing balance-Leahy Scale: Poor Standing balance comment: poor balance but can also manage with one hand holding                             Pertinent Vitals/Pain Pain Assessment: Faces Faces Pain Scale: Hurts a little bit Pain Location: movement on hips Pain Intervention(s): Repositioned    Home Living Family/patient expects to be discharged to:: Private residence Living Arrangements: Other (Comment) (son not there to answer, pt did not clarify) Available Help at Discharge: Family             Additional Comments: son not there and pt cannot answer questions about home    Prior Function Level of Independence: Independent  Hand Dominance   Dominant Hand: Right    Extremity/Trunk Assessment   Upper Extremity Assessment Upper Extremity Assessment: Defer to OT evaluation    Lower Extremity Assessment Lower Extremity Assessment: Generalized weakness    Cervical / Trunk Assessment Cervical / Trunk Assessment: Kyphotic  Communication   Communication: Prefers language other than  English  Cognition Arousal/Alertness: Awake/alert Behavior During Therapy: Flat affect;Impulsive Overall Cognitive Status: Impaired/Different from baseline Area of Impairment: Problem solving;Awareness;Safety/judgement;Following commands;Memory;Attention;Orientation                 Orientation Level: Situation;Time;Place Current Attention Level: Divided Memory: Decreased recall of precautions;Decreased short-term memory Following Commands: Follows one step commands inconsistently;Follows one step commands with increased time Safety/Judgement: Decreased awareness of safety;Decreased awareness of deficits Awareness: Intellectual Problem Solving: Slow processing;Requires verbal cues;Requires tactile cues;Decreased initiation;Difficulty sequencing General Comments: slow to answer questions posed by translator      General Comments General comments (skin integrity, edema, etc.): pt was assisted to BR and back, cannot handle clothing and tends to ignore lines    Exercises     Assessment/Plan    PT Assessment Patient needs continued PT services  PT Problem List Decreased range of motion;Decreased strength;Decreased activity tolerance;Decreased balance;Decreased mobility;Decreased coordination;Decreased cognition;Decreased knowledge of use of DME;Decreased safety awareness;Decreased knowledge of precautions;Cardiopulmonary status limiting activity       PT Treatment Interventions DME instruction;Gait training;Stair training;Functional mobility training;Therapeutic activities;Therapeutic exercise;Balance training;Neuromuscular re-education;Patient/family education    PT Goals (Current goals can be found in the Care Plan section)  Acute Rehab PT Goals Patient Stated Goal: none stated PT Goal Formulation: Patient unable to participate in goal setting Time For Goal Achievement: 05/25/20 Potential to Achieve Goals: Good    Frequency Min 3X/week   Barriers to discharge Other  (comment) (unclear exactly what pt must do to go home)      Co-evaluation               AM-PAC PT "6 Clicks" Mobility  Outcome Measure Help needed turning from your back to your side while in a flat bed without using bedrails?: A Little Help needed moving from lying on your back to sitting on the side of a flat bed without using bedrails?: A Lot Help needed moving to and from a bed to a chair (including a wheelchair)?: A Lot Help needed standing up from a chair using your arms (e.g., wheelchair or bedside chair)?: A Lot Help needed to walk in hospital room?: A Lot Help needed climbing 3-5 steps with a railing? : Total 6 Click Score: 12    End of Session Equipment Utilized During Treatment: Gait belt Activity Tolerance: Patient limited by fatigue;Treatment limited secondary to medical complications (Comment) Patient left: in bed;with call bell/phone within reach;with bed alarm set;with nursing/sitter in room;Other (comment) (translator on Ipad to perform visit) Nurse Communication: Other (comment);Mobility status (urine incontinence) PT Visit Diagnosis: Unsteadiness on feet (R26.81);Muscle weakness (generalized) (M62.81);Difficulty in walking, not elsewhere classified (R26.2);Adult, failure to thrive (R62.7)    Time: 1250-1321 PT Time Calculation (min) (ACUTE ONLY): 31 min   Charges:   PT Evaluation $PT Eval Moderate Complexity: 1 Mod PT Treatments $Gait Training: 8-22 mins       Ramond Dial 05/11/2020, 2:36 PM  Mee Hives, PT MS Acute Rehab Dept. Number: [place]

## 2020-05-11 NOTE — Progress Notes (Signed)
Pharmacy Antibiotic Note  Jim Little is a 74 y.o. male admitted on 05/10/2020 with AMS/urosepsis.  Pharmacy has been consulted for Vancomycin and Cefepime  dosing.  Plan: Vancomycin 1250 mg IV q48h Cefepime 2 g IV q12h  Height: 5\' 6"  (167.6 cm) Weight: 62 kg (136 lb 11 oz) IBW/kg (Calculated) : 63.8  Temp (24hrs), Avg:102.8 F (39.3 C), Min:102.1 F (38.9 C), Max:103.8 F (39.9 C)  Recent Labs  Lab 05/10/20 2009 05/10/20 2148 05/11/20 0113 05/11/20 0300  WBC 11.7*  --   --   --   CREATININE 1.54*  --   --   --   LATICACIDVEN 4.2* 3.8* 4.1* 2.7*    Estimated Creatinine Clearance: 35.8 mL/min (A) (by C-G formula based on SCr of 1.54 mg/dL (H)).    No Known Allergies  Caryl Pina 05/11/2020 4:05 AM

## 2020-05-11 NOTE — Progress Notes (Signed)
Pt for transfer to progressive.

## 2020-05-11 NOTE — Progress Notes (Signed)
Code Sepsis note. Received Abx and3,000 ml IVF bolus of the 1974 mlalloted for sepsis protocol. LA trended down 4.2 3.8 & 2.7 Admitted to 4E12 for further monitoring.   Kyliah Deanda DNP [place] [employer] [place] [employer] RN

## 2020-05-11 NOTE — Progress Notes (Signed)
Attempted to insert coude catheter 16 Fr. Unable to advance due to resistance. Primary RN made aware. Pt will benefit from urology consult.    Gerald Stabs, RN 4W/MW

## 2020-05-11 NOTE — ED Notes (Signed)
Patient transported to CT scan . 

## 2020-05-11 NOTE — Progress Notes (Signed)
Received patient to room 4e12 from 6N.Tele monitor applied and CCMD notified. CHG bath given. Patient is confused, son in room with patient.

## 2020-05-11 NOTE — Progress Notes (Addendum)
FPTS Interim Progress Note  Patient initially with temporary admission orders placed.  Patient moved to Med-Surg bed, then lactic acid resulted, 4.2>3.8>4.1.  Patient seen and examined at bedside again.  Briefly, he is protecting airway with some purposeful movements, but not following commands or answering questions.  He is protecting his airway.  GCS 9.  Patient transferred to progressive bed and CCM consulted given continued Lactic Acid >4 and no improvement S/P 3L.  Please see H&P for full details and examination.  [place], DO 05/11/2020, 2:57 AM PGY-2, [facility] pager [phone]

## 2020-05-11 NOTE — H&P (Addendum)
[facility] Admission History and Physical Service Pager: [phone]  Patient name: Jim Little Medical record number: [id number] Date of birth: 21-Feb-1946 Age: 74 y.o. Gender: male  Primary Care Provider: [name], MD Consultants: None  Code Status: Full code Preferred Emergency Contact: Rosalee Kaufman [phone]  Chief Complaint: AMS   Assessment and Plan: Jim Little is a 74 y.o. male presenting with altered mental status 2/2 sepsis. PMH is significant for HTN, hepatic cirrhosis, history of cataracts, BPH.  Sepsis, likely 2/2 UTI Patient reportedly brought to the emergency department due to altered mental status by the son.  He is reportedly not been feeling well for the last 2 days.  On arrival patient was febrile, tachycardic, hypertensive.  He appeared to be encephalopathic on arrival but ED providers were told he had been having abdominal pain and nausea.  Lactic acid was 4.2 on arrival.  30 cc/kg IV fluids were ordered and repeat lactic acid was also ordered.  Repeat lactic acid was collected prior to fluids being finished but showed improvement to 3.8.  After 3 L of lactated Ringer's the patient's lactic acid trended up to 4.1.  Urinalysis was collected showing possible UTI.  CMP significant for creatinine 1.5.  Blood cultures and urine cultures were collected and patient was started on azithromycin and ceftriaxone by the ED provider.  Chest x-ray showed no acute obvious infections.  CT abdomen showed mild wall thickening with adjacent inflammatory changes at the gastroduodenal junction which could be due to mild gastritis.  No surrounding fluid collections or free air.  Findings which could be suggestive of mild chronic cystitis.  Diverticulosis without diverticulitis. Due to patient's altered mental status a CT head was also obtained which showed no acute intracranial abnormality.  CCM was consulted regarding patient's increase in lactic acid and reported  the although it may be increasing his blood pressures are stable and he has able to maintain an airway at this time so does not need ICU care at this time but recommends progressive care.  I also recommended transitioning to broader antibiotics to cover for urosepsis.  Source of infection appears to be most likely from his urine but given the patient's worsening lactic acid, recurrent fever up to 103.8, and no improvement in mental status I think broad-spectrum coverage with cefepime and vancomycin are necessary.  We will continue to monitor culture results and do frequent neuro exams to help guide our care.  Patient currently on 1.5 mIVF and LA now improved to 2.7.  Will continue to trend to 2.7.  -Admit to inpatient teaching service with Dr. Owens Shark -Every 4 hours vitals -Every 2 hour neurochecks -Continue to trend lactic acid until downtrending -Follow-up on blood and urine cultures -S/p 30 cc/kg IV fluids -S/p CTX (6/16) and azithromycin (6/16).  -Transition antibiotics to cefepime and vancomycin per pharmacy consult -Normal saline at 150 mL/h -PT/OT eval and treat -Bedside swallow and if unable to pass bedside swallow speech referral for swallow study -Continuous pulse ox -Morning CBC and CMP -Fall precautions -N.p.o. at this time  AMS Patient on evaluation with GCS of 9.  Patient received head CT in the emergency department which showed no acute intracranial abnormalities.  Patient son reports that he has been altered for approximately 1 day.  Patient son denies any alcohol use or illicit substance use.  Altered mental status most likely related to urosepsis.  Patient did receive dose of Ativan in the emergency department prior to my evaluation shows mental  status may be partially attributed to by this medication.  Could consider EEG or MRI if no improvement in mental status with improvement in infection.  Per conversation with CCM, will continue to monitor closely with neuro checks q2h. -  neuro checks q2h -Continue to monitor for worsening signs of encephalopathy -Avoid sedatives when possible -If patient shows worsening signs consider repeat scans or MRI brain  Thrombocytopenia Patient's platelets on arrival were 50,000.  Patient has history of thrombocytopenia, last Plt 3 mths ago 136.  Mild elevation in PT 18.6, INR 1.6, PTT 39  No signs of clotting or bleeding at this time, therefore no sign of DIC and no indication for DIC panel.  Initial smear with diff with normal platelet morphology.  Suspect that this is 2/2 chronic liver disease with worsening from severe infection. -Morning CBC -Smear ordered -Consider fibrinogen and D-dimer if thrombocytopenia worsens or patient shows signs of clots or bleeding  AKI Creatinine on arrival was 1.5.  Baseline appears to be less than 1.  This is most likely related to a combination of patient's suspected UTI along with decreased volume status. -Continue maintenance fluids at 150 mL an hour -Avoid nephrotoxic agents -Morning CMP  Hypertension Blood pressure since arrival has ranged from 141/78-187/127.  Patient is not currently on medications at home.  Blood pressure goal outpatient<150/90.  BP now improving, 123/69. -Continue to monitor blood pressure -No medications necessary at this time  History of cirrhosis Per chart review patient is currently being worked up outpatient for cirrhosis.  Denies any alcohol use.  Has been scheduled with gastroenterology outpatient.  Negative Hepatitis panel.  Likely contributing to thrombocytopenia, mildly elevated   Hx of cataracts Patient has referral placed for ophthalmology by PCP. -Continue to work-up outpatient  Lung nodule Small calcified nodule seen at right lung base with CT abd/pelvis. - consider outpatient CT chest to further evaluate  FEN/GI: N.p.o. pending improvement in mental status Prophylaxis: Lovenox  Disposition: Admit to med-surg, then transfer to  progressive  History of Present Illness:  Jim Little is a 74 y.o. male presenting with altered mental status.  Entire interview takes place using Swahili interpreter history is provided by patient's son  Patient has longstanding history of complaining of urinary symptoms including urgency with poor stream.  Patient son reports that this is gotten worse over the last few days.  He says that he has had subjective fever and chills for the last 3 days and over the last 2 days his p.o. intake has decreased.  Patient son report his mental status changed yesterday and he began not responding to questions or responding inappropriately.  He says that this is never happened before in the past.  Denies that the patient had any any recent illnesses.  Denies that the patient complained of any cough, congestion, shortness of breath, chest pain, swelling in his lower extremities, blood in his urine, diarrhea, constipation, vomiting.  He does report that his father had complained of abdominal pain and some nausea over the previous 2 days as well.  Review Of Systems: Per HPI with the following additions: Provided by patient's son  Review of Systems  HENT: Negative for congestion and rhinorrhea.   Respiratory: Negative for cough and shortness of breath.   Cardiovascular: Negative for chest pain and leg swelling.  Gastrointestinal: Positive for abdominal pain and nausea. Negative for constipation, diarrhea and vomiting.  Genitourinary: Positive for difficulty urinating, dysuria, frequency and urgency. Negative for hematuria.  Musculoskeletal: Negative for myalgias and  neck pain.  Psychiatric/Behavioral: Positive for confusion.     Patient Active Problem List   Diagnosis Date Noted  . Sepsis ([place]) 05/11/2020  . Bilateral cataracts 07/15/2018  . Refugee health examination 04/16/2018  . Splenomegaly 04/16/2018  . Chronic back pain 04/16/2018  . Hepatic cirrhosis ([place]) 04/16/2018  . Lower urinary tract symptoms  (LUTS) 04/16/2018  . HTN (hypertension) 04/16/2018    Past Medical History: Past Medical History:  Diagnosis Date  . BPH (benign prostatic hyperplasia)   . Cirrhosis ([place])   . Splenomegaly     Past Surgical History: Past Surgical History:  Procedure Laterality Date  . UPPER GASTROINTESTINAL ENDOSCOPY  03/2018   [place] records --candidiasis plus esophageal varices grade 2.  Treated with fluconazole and status post esophageal banding.  Marland Kitchen US ECHOCARDIOGRAPHY  10/2016   per overseas records from [place] - LVEF 68%; mildly calcified aortic valve, normal function otherwise    Social History: Social History   Tobacco Use  . Smoking status: Never Smoker  . Smokeless tobacco: Never Used  Substance Use Topics  . Alcohol use: Never  . Drug use: Not on file    Please also refer to relevant sections of EMR.  Family History: No significant family history noted  Allergies and Medications: No Known Allergies No current facility-administered medications on file prior to encounter.   Current Outpatient Medications on File Prior to Encounter  Medication Sig Dispense Refill  . azelastine (OPTIVAR) 0.05 % ophthalmic solution Place 1 drop into both eyes 2 (two) times daily. 6 mL 12  . fluticasone (FLONASE) 50 MCG/ACT nasal spray Place 2 sprays into both nostrils daily. 16 g 6  . Olopatadine HCl 0.2 % SOLN Apply 1 drop to eye daily. 2.5 mL 3  . tamsulosin (FLOMAX) 0.4 MG CAPS capsule Take 1 capsule (0.4 mg total) by mouth daily after supper. 90 capsule 3  . traMADol (ULTRAM) 50 MG tablet Take 1 tablet (50 mg total) by mouth every 8 (eight) hours as needed. 30 tablet 1    Objective: BP (!) 147/51 (BP Location: Right Arm)   Pulse (!) 108   Temp (!) 102.1 F (38.9 C) (Rectal)   Resp 20   Ht 5\' 6"  (1.676 m)   Wt 65.8 kg   SpO2 94%   BMI 23.41 kg/m  Physical Exam Constitutional:      Comments: Patient is laying in bed, GCS of 9.  Unable to follow commands  HENT:     Head:  Normocephalic and atraumatic.     Nose: No congestion or rhinorrhea.     Mouth/Throat:     Mouth: Mucous membranes are dry.     Comments: Patient with black-tinged tongue Eyes:     Pupils: Pupils are equal, round, and reactive to light.     Comments: Patient unable to follow commands regarding extraocular eye movement  Cardiovascular:     Rate and Rhythm: Tachycardia present.     Pulses: Normal pulses.  Pulmonary:     Effort: Pulmonary effort is normal.     Breath sounds: Normal breath sounds.  Abdominal:     Palpations: Abdomen is soft.     Comments: Difficult to tell if patient is having pain with palpation or is just irritated by palpation  Musculoskeletal:        General: No swelling or deformity.     Cervical back: Neck supple. No rigidity.  Lymphadenopathy:     Cervical: No cervical adenopathy.  Skin:    Comments:  Patient felt very warm during exam.  Asked nursing staff to check his temperature.  Rectal temp of 103.8 F  Neurological:     Comments: Patient has GCS of 9, patient will not respond to questions, pupillary responses equal and reactive to light bilaterally, no nuchal rigidity noted, on reassessment upstairs patient would open his eyes in response.    Addition to above: localizes painful stimuli  Labs and Imaging: CBC Latest Ref Rng & Units 05/10/2020 01/31/2020 07/15/2018  WBC 4.0 - 10.5 K/uL 11.7(H) 4.8 5.9  Hemoglobin 13.0 - 17.0 g/dL 13.8 14.1 14.8  Hematocrit 39 - 52 % 41.9 40.8 41.8  Platelets 150 - 400 K/uL 50(L) 136(L) 154   CMP Latest Ref Rng & Units 05/10/2020 03/06/2020 01/31/2020  Glucose 70 - 99 mg/dL 174(H) - 70  BUN 8 - 23 mg/dL 23 - 17  Creatinine 0.61 - 1.24 mg/dL 1.54(H) - 0.87  Sodium 135 - 145 mmol/L 135 - 140  Potassium 3.5 - 5.1 mmol/L 4.9 - 4.6  Chloride 98 - 111 mmol/L 103 - 105  CO2 22 - 32 mmol/L 21(L) - 25  Calcium 8.9 - 10.3 mg/dL 8.9 - 9.2  Total Protein 6.5 - 8.1 g/dL 7.8 8.1 8.2  Total Bilirubin 0.3 - 1.2 mg/dL 1.8(H) 0.6 0.5   Alkaline Phos 38 - 126 U/L 141(H) 141(H) 143(H)  AST 15 - 41 U/L 47(H) 27 35  ALT 0 - 44 U/L 27 19 27    PT-18.6 INR-1.6 APTT-39  Lactic acid 4.2> 3.8> 4.1  Results for MARLON, SULEIMAN (MRN [id number]) as of 05/11/2020 03:57  Ref. Range 05/10/2020 22:37  Appearance Latest Ref Range: CLEAR  HAZY (A)  Bilirubin Urine Latest Ref Range: NEGATIVE  NEGATIVE  Color, Urine Latest Ref Range: YELLOW  YELLOW  Glucose, UA Latest Ref Range: NEGATIVE mg/dL NEGATIVE  Hgb urine dipstick Latest Ref Range: NEGATIVE  NEGATIVE  Ketones, ur Latest Ref Range: NEGATIVE mg/dL NEGATIVE  Leukocytes,Ua Latest Ref Range: NEGATIVE  MODERATE (A)  Nitrite Latest Ref Range: NEGATIVE  NEGATIVE  pH Latest Ref Range: 5.0 - 8.0  5.0  Protein Latest Ref Range: NEGATIVE mg/dL 30 (A)  Specific Gravity, Urine Latest Ref Range: 1.005 - 1.030  1.019  Results for DARIOUS, REHMAN (MRN [id number]) as of 05/11/2020 03:57  Ref. Range 05/10/2020 22:37  Amorphous Crystal Unknown PRESENT  Bacteria, UA Latest Ref Range: NONE SEEN  FEW (A)  Hyaline Casts, UA Unknown PRESENT  Non Squamous Epithelial Latest Ref Range: NONE SEEN  0-5 (A)  RBC / HPF Latest Ref Range: 0 - 5 RBC/hpf 0-5  Squamous Epithelial / LPF Latest Ref Range: 0 - 5  0-5  WBC, UA Latest Ref Range: 0 - 5 WBC/hpf 11-20   EKG: Sinus tachycardia  CT ABDOMEN PELVIS WO CONTRAST  Result Date: 05/11/2020 CLINICAL DATA:  Nausea and vomiting, history of splenomegaly and cirrhosis EXAM: CT ABDOMEN AND PELVIS WITHOUT CONTRAST TECHNIQUE: Multidetector CT imaging of the abdomen and pelvis was performed following the standard protocol without IV contrast. COMPARISON:  May 05, 2018 FINDINGS: Lower chest: There is moderate cardiomegaly. No pericardial fluid/thickening. A small hiatal hernia is present. A small calcified nodule seen at the right lung base. Hepatobiliary: A slightly nodular liver contour is seen. No evidence of calcified gallstones or biliary ductal dilatation. Pancreas:   Unremarkable.  No surrounding inflammatory changes. Spleen: There is mild splenomegaly which measures 14 cm in craniocaudad dimension. Adrenals/Urinary Tract: Both adrenal glands appear normal. The kidneys and collecting system appear normal  without evidence of urinary tract calculus or hydronephrosis. There is diffuse mild bladder wall thickening present. Stomach/Bowel: There appears to be a mild wall thickening and fat stranding changes seen at the gastroduodenal junction. No surrounding fluid collections however are noted. The remainder of the small bowel and colon are grossly unremarkable. Scattered colonic diverticula are noted without diverticulitis. Vascular/Lymphatic: There are no enlarged abdominal or pelvic lymph nodes. No significant gross vascular findings are present. Reproductive:  The prostate is unremarkable. Other: No evidence of abdominal wall mass or hernia. Musculoskeletal: No acute or significant osseous findings. Degenerative changes are seen at the thoracolumbar spine with large anterior osteophytes. Bridging osteophytes seen at L5-S1. IMPRESSION: 1. Mild wall thickening with adjacent inflammatory changes at the gastroduodenal junction which could be due to mild gastritis. No surrounding fluid collections or free air. 2. Findings which could be suggestive of mild chronic cystitis. 3. Diverticulosis without diverticulitis. Electronically Signed   By: Prudencio Pair M.D.   On: 05/11/2020 00:47   DG Chest 2 View  Result Date: 05/10/2020 CLINICAL DATA:  Suspected sepsis,. Speaking and responding 2 days prior, poor historian EXAM: CHEST - 2 VIEW COMPARISON:  Radiograph 05/01/2018, CT 07/22/2018 FINDINGS: Streaky changes in the lung bases likely reflecting atelectasis with mild central vascular crowding. No consolidation, features of edema, pneumothorax, or effusion. Cardiomediastinal contours are unremarkable for the portable technique. Lucency of right subdiaphragmatic air is likely related to  an air-filled loop of colon seen on lateral radiograph. No convincing evidence of free subdiaphragmatic air. No acute osseous abnormality. Degenerative changes are present in the imaged spine and shoulders. IMPRESSION: Streaky changes in the lung bases likely reflecting atelectasis. No other acute cardiopulmonary abnormality. Electronically Signed   By: Lovena Le M.D.   On: 05/10/2020 20:30   CT Head Wo Contrast  Result Date: 05/11/2020 CLINICAL DATA:  Altered mental status EXAM: CT HEAD WITHOUT CONTRAST TECHNIQUE: Contiguous axial images were obtained from the base of the skull through the vertex without intravenous contrast. COMPARISON:  None. FINDINGS: Brain: No evidence of acute territorial infarction, hemorrhage, hydrocephalus,extra-axial collection or mass lesion/mass effect. There is dilatation the ventricles and sulci consistent with age-related atrophy. Low-attenuation changes in the deep white matter consistent with small vessel ischemia. Vascular: No hyperdense vessel or unexpected calcification. Skull: The skull is intact. No fracture or focal lesion identified. Sinuses/Orbits: The visualized paranasal sinuses and mastoid air cells are clear. The orbits and globes intact. Other: None IMPRESSION: No acute intracranial abnormality. Findings consistent with age related atrophy and chronic small vessel ischemia Electronically Signed   By: Prudencio Pair M.D.   On: 05/11/2020 00:44     Gifford Shave, MD 05/11/2020, 3:56 AM PGY-1, [place] Intern pager: [phone], text pages welcome  FPTS Upper-Level Resident Addendum   I have independently interviewed and examined the patient. I have discussed the above with the original author and agree with their documentation. My edits for correction/addition/clarification are in green. Please see also any attending notes.   [place] Constable, D.O. PGY-2, [facility] 05/11/2020 4:05 AM  FPTS Service pager:  [phone] (text pages welcome through [facility])

## 2020-05-11 NOTE — Progress Notes (Addendum)
FPTS Interim Progress Note  Swahili interpreter used for this encounter   S:Patient reporting diarrhea that started Monday. Per son, patient was gradually altered at home for past two days and complaining. At baseline, patient is independent.   O: BP (!) 154/68 (BP Location: Right Arm)   Pulse 90   Temp 98.1 F (36.7 C) (Oral)   Resp 18   Ht 5\' 6"  (1.676 m)   Wt 62 kg   SpO2 97%   BMI 22.06 kg/m    GEN: ill appearing male, in no acute distress, verbally responsive to commands   CV: regular rate and rhythm, no murmurs appreciated RESP: no increased work of breathing, clear to ascultation bilaterally  ABD: Bowel sounds present,  suprapubic tenderness, guarding, mildly distended MSK: no edema, or cyanosis noted SKIN: warm, dry NEURO: strength 5/5 b/l UE and LE, GCS 14, oriented to person  A/P:  AMS  SIRS  Much improved this morning.  GCS now 14 from 9 on admission. Patient LA 3.0 from 2.7 today,  Max 4.1 this admission. Will not trend LA as patient has known liver disease and will likely take some time for lactic acidosis to resolve. Tachycardia has resolved. Afebrile overnight 103.8 F max.  BP stable. Leukocytosis is down-trending from 11.7 to 9.7.  - start soft diet - SLP consulted  - Continue Cefepime  - Discontinue Vanc    Thrombocytosis Down trended to 37 from 50 this morning. Likely secondary to his liver disease.    Hx of Cirrhosis  Has been worked up outpatient. Ammonia level WNL.  - Obtain RUQ [place]: assess for portal vein thrombus  - consider acute Hep panel   HTN Normotensive this morning.   Concern for urinary retention  BPH  AKI  Patient with known BPH. Bladder scan performed and post void residual 699 mL.Cr 1.23 from 1.5 on admission. Resolving with IVF.   - Place Foley catheter  - Restart Flomax  - Voiding trial in 3d  - Has outpt urology follow up on 05/18/20   Diarrhea Non bloody diarrhea for past couple of days.  - consider GI panel  - Continue  mIVF  - Monitor fluid status    See full H&P for additional details   Lyndee Hensen, DO 05/11/2020, 9:32 AM PGY-1, [place] [facility] Service pager [phone]

## 2020-05-11 NOTE — Progress Notes (Signed)
PHARMACY - PHYSICIAN COMMUNICATION CRITICAL VALUE ALERT - BLOOD CULTURE IDENTIFICATION (BCID)  Jim Little is an 74 y.o. male who presented to [facility] on 05/10/2020 with a chief complaint of diarrhea that started on Monday and progressive altered mental status.   Currently patient's blood culture is growing 4/4 Proteus.   Name of physician (or Provider) Contacted: Dr. Susa Simmonds  Current antibiotics: Cefepime 2 gm IV q12hr  Changes to prescribed antibiotics recommended: Ceftriaxone 2 gm IV q24hr  Recommendations accepted by provider  Results for orders placed or performed during the hospital encounter of 05/10/20  Blood Culture ID Panel (Reflexed) (Collected: 05/10/2020  7:45 PM)  Result Value Ref Range   Enterococcus species NOT DETECTED NOT DETECTED   Listeria monocytogenes NOT DETECTED NOT DETECTED   Staphylococcus species NOT DETECTED NOT DETECTED   Staphylococcus aureus (BCID) NOT DETECTED NOT DETECTED   Streptococcus species NOT DETECTED NOT DETECTED   Streptococcus agalactiae NOT DETECTED NOT DETECTED   Streptococcus pneumoniae NOT DETECTED NOT DETECTED   Streptococcus pyogenes NOT DETECTED NOT DETECTED   Acinetobacter baumannii NOT DETECTED NOT DETECTED   Enterobacteriaceae species DETECTED (A) NOT DETECTED   Enterobacter cloacae complex NOT DETECTED NOT DETECTED   Escherichia coli NOT DETECTED NOT DETECTED   Klebsiella oxytoca NOT DETECTED NOT DETECTED   Klebsiella pneumoniae NOT DETECTED NOT DETECTED   Proteus species DETECTED (A) NOT DETECTED   Serratia marcescens NOT DETECTED NOT DETECTED   Carbapenem resistance NOT DETECTED NOT DETECTED   Haemophilus influenzae NOT DETECTED NOT DETECTED   Neisseria meningitidis NOT DETECTED NOT DETECTED   Pseudomonas aeruginosa NOT DETECTED NOT DETECTED   Candida albicans NOT DETECTED NOT DETECTED   Candida glabrata NOT DETECTED NOT DETECTED   Candida krusei NOT DETECTED NOT DETECTED   Candida parapsilosis NOT DETECTED NOT DETECTED    Candida tropicalis NOT DETECTED NOT DETECTED    Acey Lav, PharmD  PGY1 Acute Care Pharmacy Resident 05/11/2020  2:31 PM

## 2020-05-12 ENCOUNTER — Telehealth: Payer: Medicaid Other

## 2020-05-12 ENCOUNTER — Inpatient Hospital Stay (HOSPITAL_COMMUNITY): Payer: Medicaid Other

## 2020-05-12 DIAGNOSIS — R7881 Bacteremia: Secondary | ICD-10-CM

## 2020-05-12 DIAGNOSIS — D696 Thrombocytopenia, unspecified: Secondary | ICD-10-CM

## 2020-05-12 LAB — CBC WITH DIFFERENTIAL/PLATELET
Abs Immature Granulocytes: 0.05 10*3/uL (ref 0.00–0.07)
Basophils Absolute: 0 10*3/uL (ref 0.0–0.1)
Basophils Relative: 0 %
Eosinophils Absolute: 0.1 10*3/uL (ref 0.0–0.5)
Eosinophils Relative: 1 %
HCT: 34.7 % — ABNORMAL LOW (ref 39.0–52.0)
Hemoglobin: 11.7 g/dL — ABNORMAL LOW (ref 13.0–17.0)
Immature Granulocytes: 1 %
Lymphocytes Relative: 23 %
Lymphs Abs: 1.7 10*3/uL (ref 0.7–4.0)
MCH: 33.8 pg (ref 26.0–34.0)
MCHC: 33.7 g/dL (ref 30.0–36.0)
MCV: 100.3 fL — ABNORMAL HIGH (ref 80.0–100.0)
Monocytes Absolute: 1.3 10*3/uL — ABNORMAL HIGH (ref 0.1–1.0)
Monocytes Relative: 17 %
Neutro Abs: 4.2 10*3/uL (ref 1.7–7.7)
Neutrophils Relative %: 58 %
Platelets: 41 10*3/uL — ABNORMAL LOW (ref 150–400)
RBC: 3.46 MIL/uL — ABNORMAL LOW (ref 4.22–5.81)
RDW: 15.8 % — ABNORMAL HIGH (ref 11.5–15.5)
WBC: 7.3 10*3/uL (ref 4.0–10.5)
nRBC: 0 % (ref 0.0–0.2)

## 2020-05-12 LAB — COMPREHENSIVE METABOLIC PANEL
ALT: 21 U/L (ref 0–44)
AST: 36 U/L (ref 15–41)
Albumin: 2.2 g/dL — ABNORMAL LOW (ref 3.5–5.0)
Alkaline Phosphatase: 85 U/L (ref 38–126)
Anion gap: 4 — ABNORMAL LOW (ref 5–15)
BUN: 20 mg/dL (ref 8–23)
CO2: 23 mmol/L (ref 22–32)
Calcium: 8.4 mg/dL — ABNORMAL LOW (ref 8.9–10.3)
Chloride: 111 mmol/L (ref 98–111)
Creatinine, Ser: 1.03 mg/dL (ref 0.61–1.24)
GFR calc Af Amer: >60 mL/min (ref >60)
GFR calc non Af Amer: >60 mL/min (ref >60)
Glucose, Bld: 80 mg/dL (ref 70–99)
Potassium: 4.2 mmol/L (ref 3.5–5.1)
Sodium: 138 mmol/L (ref 135–145)
Total Bilirubin: 1.1 mg/dL (ref 0.3–1.2)
Total Protein: 6 g/dL — ABNORMAL LOW (ref 6.5–8.1)

## 2020-05-12 LAB — HEMOGLOBIN A1C
Hgb A1c MFr Bld: 5.2 % (ref 4.8–5.6)
Mean Plasma Glucose: 103 mg/dL

## 2020-05-12 LAB — PATHOLOGIST SMEAR REVIEW

## 2020-05-12 LAB — HIV ANTIBODY (ROUTINE TESTING W REFLEX): HIV Screen 4th Generation wRfx: NONREACTIVE

## 2020-05-12 MED ORDER — ENSURE ENLIVE PO LIQD
237.0000 mL | Freq: Three times a day (TID) | ORAL | Status: DC
  Administered 2020-05-12 – 2020-05-14 (×6): 237 mL via ORAL

## 2020-05-12 MED ORDER — GADOBUTROL 1 MMOL/ML IV SOLN
6.5000 mL | Freq: Once | INTRAVENOUS | Status: AC | PRN
  Administered 2020-05-12: 6.5 mL via INTRAVENOUS

## 2020-05-12 MED ORDER — FAMOTIDINE 20 MG PO TABS
20.0000 mg | ORAL_TABLET | Freq: Every day | ORAL | Status: DC
  Administered 2020-05-12 – 2020-05-14 (×3): 20 mg via ORAL
  Filled 2020-05-12 (×3): qty 1

## 2020-05-12 MED ORDER — CHLORHEXIDINE GLUCONATE CLOTH 2 % EX PADS
6.0000 | MEDICATED_PAD | Freq: Every day | CUTANEOUS | Status: DC
  Administered 2020-05-12 – 2020-05-14 (×3): 6 via TOPICAL

## 2020-05-12 MED ORDER — SUCRALFATE 1 GM/10ML PO SUSP: 1.0000 g | Freq: Three times a day (TID) | ORAL | Status: DC | PRN

## 2020-05-12 NOTE — Progress Notes (Signed)
Brief Progress Note  Spoke with RN Orvil Feil, says patient is alert and oriented today, mentally is back around his baseline; however is currently tearful and very uncomfortable from the distention and has bladder.  Says that he is suffering.  Called Dr. Gloriann Loan, urologist. Briefed him on the patient's situation, especially the low platelet count and desire to limit trauma to the area. Dr. Gloriann Loan requested the urology cart, will come and attempt another Foley.  Milus Banister, [place], PGY-2 05/12/2020 9:12 AM

## 2020-05-12 NOTE — Progress Notes (Signed)
Initial Nutrition Assessment  DOCUMENTATION CODES:   Not applicable  INTERVENTION:  Provide Ensure Enlive po TID, each supplement provides 350 kcal and 20 grams of protein.  Encourage adequate PO intake.   NUTRITION DIAGNOSIS:   Increased nutrient needs related to chronic illness (cirrhosis) as evidenced by estimated needs.  GOAL:   Patient will meet greater than or equal to 90% of their needs  MONITOR:   PO intake, Supplement acceptance, Skin, Weight trends, Labs, I & O's  REASON FOR ASSESSMENT:   Consult Assessment of nutrition requirement/status  ASSESSMENT:   74 y.o. male presenting with altered mental status secondary to sepsis, proteus bacteremia . PMH is significant for HTN, hepatic cirrhosis, history of cataracts, BPH.  Nurse Isidore Moos, interpreted in [place] [facility] [place] visit. Pt with poor appetite and poor po intake since admission. Pt reports loss of appetite started Monday 6/14. Family has brought in food from home to encourage po intake. Pt agreeable to Ensure to aid in caloric and protein needs. Pt encouraged to eat his food at meals and to drink his supplement.   NUTRITION - FOCUSED PHYSICAL EXAM:    Most Recent Value  Orbital Region Unable to assess  Upper Arm Region No depletion  Thoracic and Lumbar Region No depletion  Buccal Region Unable to assess  Temple Region Unable to assess  Clavicle Bone Region No depletion  Clavicle and Acromion Bone Region No depletion  Scapular Bone Region Unable to assess  Dorsal Hand Unable to assess  Patellar Region Mild depletion  Anterior Thigh Region Mild depletion  Posterior Calf Region Mild depletion  Edema (RD Assessment) None  Hair Reviewed  Eyes Reviewed  Mouth Reviewed  Skin Reviewed  Nails Reviewed       Diet Order:   Diet Order            Diet regular Room service appropriate? Yes with Assist; Fluid consistency: Thin  Diet effective now                 EDUCATION NEEDS:   Not appropriate for  education at this time  Skin:  Skin Assessment: Reviewed RN Assessment  Last BM:  6/17  Height:   Ht Readings from Last 1 Encounters:  05/11/20 5\' 6"  (1.676 m)    Weight:   Wt Readings from Last 1 Encounters:  05/11/20 62 kg   BMI:  Body mass index is 22.06 kg/m.  Estimated Nutritional Needs:   Kcal:  1850-2050  Protein:  90-100 grams  Fluid:  >/= 1.8 L/day  Corrin Parker, MS, RD, LDN RD pager number/after hours weekend pager number on Am[facility].

## 2020-05-12 NOTE — Progress Notes (Addendum)
Family Medicine Teaching Service Daily Progress Note Intern Pager: [phone]  Patient name: Jim Little Medical record number: [id number] Date of birth: 01-06-1946 Age: 74 y.o. Gender: male  Primary Care Provider: [name], MD Consultants: None Code Status: Full  Pt Overview and Major Events to Date:  05/11/20: Admitted 05/12/20: Right upper quadrant ultrasound, liver Dopplers  Assessment and Plan: Bassam Dresch is a 74 y.o. male presenting with altered mental status 2/2 sepsis. PMH is significant for HTN, hepatic cirrhosis, history of cataracts, BPH.  Sepsis  Proteus Bacteremia  Patient has returned to his neurological baseline.Afebrile for greater than 24 hours.  Neurological status continues to improve.  No leukocytosis, WBC 7.3 however does have monocyte left shift.  Blood cultures growing Proteus.  Suspected this to be secondary to urinary source however patient's urine culture grew nothing.  Etiology likely in the abdomen.  Patient has liver lesions and CT abdomen pelvis was notable for thickening at the gastroduodenal junction.  Patient has known splenomegaly.  Consider praziquantel and ID consult. -GI consulted, appreciate recommendations - Follow up MR Liver with contrast  - SLP consulted :currently has soft diet - Continue ceftriaxone, follow-up susceptibilities - Discontinued cefepime, Vanc, azithromycin - Blood cultures: Proteus - Follow-up HIV - Follow-up Strongyloides IgG antibodies  Macrocytic anemia MCV 100.3, hemoglobin 11.7.  Likely secondary to poor intake.  Not likely B12 as his MCV not110 but this cannot be ruled out. -Follow-up B12, folate -RD consulted, appreciate recommendations  Chest Pain  Improved.  Etiology likely MSK versus reflux patient reported sternal chest pain overnight that was reducible on exam.  EKG was not concerning for acute ischemia or ST elevations..   -Tylenol PRN -Pepcid 20 mg -Carafate  Thrombocytosis Platelets 41 today slightly  improved from yesterday 37.  From 50 on admission.  This is likely secondary to patient's cirrhotic liver disease compounded by patient's acute infection.. -Monitor with a AM CBC  -Avoid: Lovenox -SCDs for VTE prophylaxis -Consider immature platelet fraction  Hx of Cirrhosis  Has been worked up outpatient.  Patient with history of varices.  Patient referred to GI however missed his  appointment.  Unremarkable hepatic vascular Doppler evaluation though intrahepatic portal veins velocities cannot be obtained.  Of note, patient did have 2 echogenic liver lesions consistent with hemangioma versus multifocal hepatocellular carcinoma. -GI consulted, appreciate recommendations -Follow up Liver MRI w/ Contrast  -Consider nadolol versus propanolol  HTN Hypertensive 172/91 this morning.  No home medications. -Continue monitoring -Consider propranolol given patient's history of varices.   Urinary retention  BPH  Patient with known BPH. Bladder scan performed and post void residual 699 mL yesterday and 726 this morning.  Foley catheter coud catheter attempted by nursing team yesterday but on that was unsuccessful.  Cr 1.23 from 1.5 on admission. Resolving with IVF.   - Urology consulted, appreciate recommendations  - Continue Flomax  - Voiding trial in 3d  - Has outpt urology follow up on 05/18/20  -Follow-up A1c  AKI Resolved.  Creatinine on admission 1.5 and today 1.03. -Avoid nephrotoxic agents -Continue IV fluids -Monitor with AM BMP   Lung nodule Small calcified nodule seen at right lung base with CT abd/pelvis. - consider outpatient CT chest to further evaluate  FEN/GI: soft diet, replete electrolytes as needed PPx: SCDs  Disposition: Pending medical optimization  Subjective:  Patient doing well this morning but complains of abdominal pain.  Objective: Temp:  [98.1 F (36.7 C)-98.6 F (37 C)] 98.1 F (36.7 C) (06/18 0401) Pulse  Rate:  [67-90] 67 (06/18 0401) Resp:   [14-20] 14 (06/18 0401) BP: (146-176)/(68-91) 150/76 (06/18 0401) SpO2:  [90 %-100 %] 100 % (06/18 0401) Physical Exam: General: Alert, sitting upright on bedside commode, no acute distress Cardiovascular: Regular rate and rhythm Respiratory: Lungs clear to auscultation bilaterally Abdomen: Multiple 3-4cm surgical scars, mild to moderate distention, no palpable hepatosplenomegaly, mild generalized tenderness Extremities: Moving all extremities appropriately, thin extremities  Laboratory: Recent Labs  Lab 05/10/20 2009 05/11/20 0437 05/12/20 0353  WBC 11.7* 9.7 7.3  HGB 13.8 12.5* 11.7*  HCT 41.9 36.7* 34.7*  PLT 50* 37* 41*   Recent Labs  Lab 05/10/20 2009 05/11/20 0437 05/12/20 0353  NA 135 135 138  K 4.9 4.3 4.2  CL 103 105 111  CO2 21* 22 23  BUN 23 19 20   CREATININE 1.54* 1.23 1.03  CALCIUM 8.9 8.4* 8.4*  PROT 7.8 6.6 6.0*  BILITOT 1.8* 1.7* 1.1  ALKPHOS 141* 103 85  ALT 27 23 21   AST 47* 40 36  GLUCOSE 174* 109* 80        Imaging/Diagnostic Tests: US LIVER DOPPLER  Result Date: 05/11/2020 CLINICAL DATA:  Cirrhosis. EXAM: LIMITED ABDOMINAL ULTRASOUND US HEPATIC DOPPLER ARTERIAL/VENOUS ULTRASOUND COMPARISON:  CT from earlier the same day, and prior studies FINDINGS: Gallbladder: Physiologically distended without stones, wall thickening, or pericholecystic fluid. Sonographer reports no sonographic Murphy's sign. Common bile duct:  3.3 mm diameter. Liver: Lobular, heterogenous echotexture. 1.2 x 1 cm echogenic lesion in the left lobe. 2.1 x 2 cm echogenic lesion in the posterior right lobe. No biliary ductal dilatation. Portal Vein: No occlusion or thrombus. Velocities (all hepatopetal): Main:  17-22 cm/sec Right: Limited evaluation, patent. Technologist describes technically difficult study secondary to patient unable to breath-hold during the examination. Left: Limited evaluation, patent Hepatic Vein Velocities (all hepatofugal): Right:  34 cm/sec Middle:  71 cm/sec  Left:  65 cm/sec Hepatic Artery Velocity: 60 cm/sec IVC:  51 cm/sec Spleen:  No focal lesion, 11.7 x 5.3 x 8.9 cm (290 cc). Splenic Vein: No occlusion or thrombus.  Velocity: 34 cm/sec Right Kidney:  No mass or hydronephrosis, cm in length. Left Kidney:  No lesion or hydronephrosis, cm in length. Abdominal aorta:  Negative Varices: None identified Ascites: Absent IMPRESSION: 1. Unremarkable hepatic vascular Doppler evaluation, although velocities in the intrahepatic portal veins could not be obtained. 2. 2 echogenic liver lesions, without definite correlate on prior studies. Considerations include hemangiomas versus multifocal hepatocellular carcinoma. Consider hepatic MRI with contrast for further characterization. Electronically Signed   By: Lucrezia Europe M.D.   On: 05/11/2020 16:37   US Abdomen Limited RUQ  Result Date: 05/11/2020 CLINICAL DATA:  Cirrhosis. EXAM: LIMITED ABDOMINAL ULTRASOUND US HEPATIC DOPPLER ARTERIAL/VENOUS ULTRASOUND COMPARISON:  CT from earlier the same day, and prior studies FINDINGS: Gallbladder: Physiologically distended without stones, wall thickening, or pericholecystic fluid. Sonographer reports no sonographic Murphy's sign. Common bile duct:  3.3 mm diameter. Liver: Lobular, heterogenous echotexture. 1.2 x 1 cm echogenic lesion in the left lobe. 2.1 x 2 cm echogenic lesion in the posterior right lobe. No biliary ductal dilatation. Portal Vein: No occlusion or thrombus. Velocities (all hepatopetal): Main:  17-22 cm/sec Right: Limited evaluation, patent. Technologist describes technically difficult study secondary to patient unable to breath-hold during the examination. Left: Limited evaluation, patent Hepatic Vein Velocities (all hepatofugal): Right:  34 cm/sec Middle:  71 cm/sec Left:  65 cm/sec Hepatic Artery Velocity: 60 cm/sec IVC:  51 cm/sec Spleen:  No focal lesion, 11.7  x 5.3 x 8.9 cm (290 cc). Splenic Vein: No occlusion or thrombus.  Velocity: 34 cm/sec Right Kidney:  No mass  or hydronephrosis, cm in length. Left Kidney:  No lesion or hydronephrosis, cm in length. Abdominal aorta:  Negative Varices: None identified Ascites: Absent IMPRESSION: 1. Unremarkable hepatic vascular Doppler evaluation, although velocities in the intrahepatic portal veins could not be obtained. 2. 2 echogenic liver lesions, without definite correlate on prior studies. Considerations include hemangiomas versus multifocal hepatocellular carcinoma. Consider hepatic MRI with contrast for further characterization. Electronically Signed   By: Lucrezia Europe M.D.   On: 05/11/2020 16:37     Lyndee Hensen, DO 05/12/2020, 7:48 AM PGY-1, [place] Intern pager: [phone], text pages welcome

## 2020-05-12 NOTE — Progress Notes (Signed)
Called to patient's room by patient's nurse.  She reported that he was complaining of chest pain.  She said that he had a short run of tachycardia and his blood pressure increased to 170/80s.  On evaluation I used an interpreter to asked the patient questions.  He reported that he had been having pain in the middle of his chest for approximately 2 weeks intermittently.  Is unable to describe the pain at all.  Is unable to tell me of anything that makes the pain better or makes it worse.  On exam patient is tender to palpation around xiphoid process.  Heart rate is a regular rate.  Patient has normal work of breathing.  When I press on it he reports that is where it hurts.  Reports pain has resolved at rest while I am evaluating him but he is tender to palpation.  EKG was obtained prior to my arrival and showed no acute findings.  Given that patient is tender to palpate I feel this is more likely a musculoskeletal issue than cardiac in nature.  Patient also had normal EKG which is reassuring.  Patient says he is willing to try some Tylenol to help with the tenderness.  Patient has as needed Tylenol ordered.  A concern from the patient's son is that he has not eaten in 2 to 3 days.  I discussed why he has not eaten and he says that he just has no appetite.  Reports that he would like to try to drink some milk.  I encouraged him to try to eat what ever he would be willing to eat.

## 2020-05-12 NOTE — Evaluation (Signed)
Clinical/Bedside Swallow Evaluation Patient Details  Name: Jim Little MRN: [id number] Date of Birth: 1946-08-30  Today's Date: 05/12/2020 Time: SLP Start Time (ACUTE ONLY): 1009 SLP Stop Time (ACUTE ONLY): 1031 SLP Time Calculation (min) (ACUTE ONLY): 22 min  Past Medical History:  Past Medical History:  Diagnosis Date  . BPH (benign prostatic hyperplasia)   . Cirrhosis ([place])   . Splenomegaly    Past Surgical History:  Past Surgical History:  Procedure Laterality Date  . UPPER GASTROINTESTINAL ENDOSCOPY  03/2018   [place] --candidiasis plus esophageal varices grade 2.  Treated with fluconazole and status post esophageal banding.  Marland Kitchen US ECHOCARDIOGRAPHY  10/2016   per overseas records from [place] - LVEF 68%; mildly calcified aortic valve, normal function otherwise   HPI:  Pt is a 74 y.o. male presenting with altered mental status secondary to sepsis. PMH is significant for HTN, hepatic cirrhosis, history of cataracts, BPH. CXR 6/16: Streaky changes in the lung bases likely reflecting atelectasis. No other acute cardiopulmonary abnormality.   Assessment / Plan / Recommendation Clinical Impression  [facility]N [address]Jim Little [phone]), was used for translation and interpretation. Pt denied a history of dysphagia but stated that he has not been eating much since he has been nauseated and has been having difficulty with "excrement". Per the pt, he has been fearful that his eating would exacerbate these issues. Oral mechanism exam was [facility] and he presented with adequate dentition. He tolerated all solids and liquids without signs or symptoms of oropharyngeal dysphagia. A regular texture diet with thin liquids is recommended at this time and further skilled SLP services are not clinically indicated for swallowing. SLP Visit Diagnosis: Dysphagia, unspecified (R13.10)    Aspiration Risk  No limitations    Diet Recommendation Regular;Thin liquid   Liquid Administration via:  Cup;Straw Medication Administration: Whole meds with liquid Supervision: Patient able to self feed Postural Changes: Seated upright at 90 degrees    Other  Recommendations Oral Care Recommendations: Oral care BID   Follow up Recommendations None      Frequency and Duration            Prognosis        Swallow Study   General Date of Onset: 05/11/20 HPI: Pt is a 74 y.o. male presenting with altered mental status secondary to sepsis. PMH is significant for HTN, hepatic cirrhosis, history of cataracts, BPH. CXR 6/16: Streaky changes in the lung bases likely reflecting atelectasis. No other acute cardiopulmonary abnormality. Type of Study: Bedside Swallow Evaluation Previous Swallow Assessment: None Diet Prior to this Study: Dysphagia 3 (soft);Thin liquids Temperature Spikes Noted: No Respiratory Status: Room air History of Recent Intubation: No Behavior/Cognition: Alert;Cooperative;Pleasant mood Oral Cavity Assessment: Within Functional Limits Oral Care Completed by SLP: No Oral Cavity - Dentition: Adequate natural dentition Vision: Functional for self-feeding Self-Feeding Abilities: Able to feed self Patient Positioning: Upright in bed;Postural control adequate for testing Baseline Vocal Quality: Normal Volitional Cough: Strong Volitional Swallow: Able to elicit    Oral/Motor/Sensory Function Overall Oral Motor/Sensory Function: Within functional limits   Ice Chips Ice chips: Within functional limits Presentation: Spoon   Thin Liquid Thin Liquid: Within functional limits Presentation: Straw    Nectar Thick Nectar Thick Liquid: Not tested   Honey Thick Honey Thick Liquid: Not tested   Puree Puree: Within functional limits Presentation: Spoon   Solid    Jim Little, Jim Little, [name] Office number [phone] Pager [phone]  Solid: Within functional limits Presentation: Self  Fed      Jim Little 05/12/2020,12:39  PM

## 2020-05-12 NOTE — Progress Notes (Signed)
Progress note  Spoke with gastroenterology this morning, who recommended getting hepatic MRI with contrast to evaluate the lesions for hepatocellular carcinoma.  If the read shows [place] then we should contact IR for a biopsy.  GI requesting to reconsult them should they be needed as there are no other GI implications for consult from them at this time.  Jim Little, Jim Little, PGY-2 05/12/2020 9:55 AM

## 2020-05-12 NOTE — Progress Notes (Signed)
Patient's son call and patient c/o of Heart not working right per son.Felt like throbbing. Per son when he ask his father. Unable to gave me a number on pain scale. Patient point to mid chest area of pain Skin warm and dry, resp. Even and unlab. B.P. up 176/88 P-78 patient a little restless. rm. air sats 100 % . Karen Kitchens. aware and into assess patient. EKG done . We did see 2  Short runs of [employer] up then back to 78. Dr. Joselyn Glassman paged and came to see patient. Patient has very little pain now . Now patient points to lower sternum area of pain. 2 Tylenol given per M.D. request and patient did drink some milk.. Cont. To monitor patient.

## 2020-05-12 NOTE — Progress Notes (Addendum)
Text page Dr Caron Presume of bladder scan greater than 726 ml Patient has been vding all night. Awaiting return call. R.N. aware. Adbo. Is distended.

## 2020-05-12 NOTE — Consult Note (Signed)
H&P Physician requesting consult: [name]  Chief Complaint: Bacteremia, urinary retention  History of Present Illness: 74 year old male with a long history of lower urinary tract symptoms.  There is somewhat of a language barrier even with a Optometrist.  The patient has urinary retention and nursing failed x2 with catheter as well as daily.  Therefore, I was consulted.  He has Proteus bacteremia.  Unclear source.  Past Medical History:  Diagnosis Date  . BPH (benign prostatic hyperplasia)   . Cirrhosis ([place])   . Splenomegaly    Past Surgical History:  Procedure Laterality Date  . UPPER GASTROINTESTINAL ENDOSCOPY  03/2018   [place] --candidiasis plus esophageal varices grade 2.  Treated with fluconazole and status post esophageal banding.  Marland Kitchen US ECHOCARDIOGRAPHY  10/2016   per overseas records from [place] - LVEF 68%; mildly calcified aortic valve, normal function otherwise    Home Medications:  Medications Prior to Admission  Medication Sig Dispense Refill Last Dose  . azelastine (OPTIVAR) 0.05 % ophthalmic solution Place 1 drop into both eyes 2 (two) times daily. 6 mL 12   . fluticasone (FLONASE) 50 MCG/ACT nasal spray Place 2 sprays into both nostrils daily. 16 g 6   . Olopatadine HCl 0.2 % SOLN Apply 1 drop to eye daily. 2.5 mL 3   . tamsulosin (FLOMAX) 0.4 MG CAPS capsule Take 1 capsule (0.4 mg total) by mouth daily after supper. 90 capsule 3   . traMADol (ULTRAM) 50 MG tablet Take 1 tablet (50 mg total) by mouth every 8 (eight) hours as needed. 30 tablet 1    Allergies: No Known Allergies  No family history on file. Social History:  reports that he has never smoked. He has never used smokeless tobacco. He reports that he does not drink alcohol. No history on file for drug use.  ROS: A complete review of systems was performed.  All systems are negative except for pertinent findings as noted. ROS   Physical Exam:  Vital signs in last 24 hours: Temp:  [97.6  F (36.4 C)-98.6 F (37 C)] 97.6 F (36.4 C) (06/18 1148) Pulse Rate:  [67-85] 74 (06/18 1148) Resp:  [14-20] 18 (06/18 1148) BP: (146-176)/(74-91) 175/84 (06/18 1148) SpO2:  [97 %-100 %] 97 % (06/18 1148) General:  Alert and oriented, No acute distress HEENT: Normocephalic, atraumatic Neck: No JVD or lymphadenopathy Cardiovascular: Regular rate and rhythm Lungs: Regular rate and effort Abdomen: Soft, nontender, nondistended, no abdominal masses Back: No CVA tenderness Extremities: No edema Genitourinary: Circumcised phallus.  Bilateral testicles normal. Neurologic: Grossly intact  Laboratory Data:  Results for orders placed or performed during the hospital encounter of 05/10/20 (from the past 24 hour(s))  Comprehensive metabolic panel     Status: Abnormal   Collection Time: 05/12/20  3:53 AM  Result Value Ref Range   Sodium 138 135 - 145 mmol/L   Potassium 4.2 3.5 - 5.1 mmol/L   Chloride 111 98 - 111 mmol/L   CO2 23 22 - 32 mmol/L   Glucose, Bld 80 70 - 99 mg/dL   BUN 20 8 - 23 mg/dL   Creatinine, Ser 1.03 0.61 - 1.24 mg/dL   Calcium 8.4 (L) 8.9 - 10.3 mg/dL   Total Protein 6.0 (L) 6.5 - 8.1 g/dL   Albumin 2.2 (L) 3.5 - 5.0 g/dL   AST 36 15 - 41 U/L   ALT 21 0 - 44 U/L   Alkaline Phosphatase 85 38 - 126 U/L   Total Bilirubin  1.1 0.3 - 1.2 mg/dL   GFR calc non Af Amer >60 >60 mL/min   GFR calc Af Amer >60 >60 mL/min   Anion gap 4 (L) 5 - 15  CBC with Differential/Platelet     Status: Abnormal   Collection Time: 05/12/20  3:53 AM  Result Value Ref Range   WBC 7.3 4.0 - 10.5 K/uL   RBC 3.46 (L) 4.22 - 5.81 MIL/uL   Hemoglobin 11.7 (L) 13.0 - 17.0 g/dL   HCT 34.7 (L) 39 - 52 %   MCV 100.3 (H) 80.0 - 100.0 fL   MCH 33.8 26.0 - 34.0 pg   MCHC 33.7 30.0 - 36.0 g/dL   RDW 15.8 (H) 11.5 - 15.5 %   Platelets 41 (L) 150 - 400 K/uL   nRBC 0.0 0.0 - 0.2 %   Neutrophils Relative % 58 %   Neutro Abs 4.2 1.7 - 7.7 K/uL   Lymphocytes Relative 23 %   Lymphs Abs 1.7 0.7 - 4.0  K/uL   Monocytes Relative 17 %   Monocytes Absolute 1.3 (H) 0 - 1 K/uL   Eosinophils Relative 1 %   Eosinophils Absolute 0.1 0 - 0 K/uL   Basophils Relative 0 %   Basophils Absolute 0.0 0 - 0 K/uL   Immature Granulocytes 1 %   Abs Immature Granulocytes 0.05 0.00 - 0.07 K/uL   Recent Results (from the past 240 hour(s))  Culture, blood (Routine x 2)     Status: Abnormal (Preliminary result)   Collection Time: 05/10/20  7:45 PM   Specimen: BLOOD RIGHT ARM  Result Value Ref Range Status   Specimen Description BLOOD RIGHT ARM  Final   Special Requests   Final    BOTTLES DRAWN AEROBIC AND ANAEROBIC Blood Culture adequate volume   Culture  Setup Time   Final    IN BOTH AEROBIC AND ANAEROBIC BOTTLES GRAM NEGATIVE RODS CRITICAL RESULT CALLED TO, READ BACK BY AND VERIFIED WITH: Andres Shad PharmD 13:45 05/11/20 ([email or web address]) Performed at [facility], [address], [place], [place] [address]    Culture PROTEUS VULGARIS (A)  Final   Report Status PENDING  Incomplete  Blood Culture ID Panel (Reflexed)     Status: Abnormal   Collection Time: 05/10/20  7:45 PM  Result Value Ref Range Status   Enterococcus species NOT DETECTED NOT DETECTED Final   Listeria monocytogenes NOT DETECTED NOT DETECTED Final   Staphylococcus species NOT DETECTED NOT DETECTED Final   Staphylococcus aureus (BCID) NOT DETECTED NOT DETECTED Final   Streptococcus species NOT DETECTED NOT DETECTED Final   Streptococcus agalactiae NOT DETECTED NOT DETECTED Final   Streptococcus pneumoniae NOT DETECTED NOT DETECTED Final   Streptococcus pyogenes NOT DETECTED NOT DETECTED Final   Acinetobacter baumannii NOT DETECTED NOT DETECTED Final   Enterobacteriaceae species DETECTED (A) NOT DETECTED Final    Comment: Enterobacteriaceae represent a large family of gram-negative bacteria, not a single organism. CRITICAL RESULT CALLED TO, READ BACK BY AND VERIFIED WITH: Andres Shad PharmD 13:45 05/11/20 ([email or web address])    Enterobacter cloacae  complex NOT DETECTED NOT DETECTED Final   Escherichia coli NOT DETECTED NOT DETECTED Final   Klebsiella oxytoca NOT DETECTED NOT DETECTED Final   Klebsiella pneumoniae NOT DETECTED NOT DETECTED Final   Proteus species DETECTED (A) NOT DETECTED Final    Comment: CRITICAL RESULT CALLED TO, READ BACK BY AND VERIFIED WITH: Andres Shad PharmD 13:45 05/11/20 ([email or web address])    Serratia marcescens NOT DETECTED NOT DETECTED Final  Carbapenem resistance NOT DETECTED NOT DETECTED Final   Haemophilus influenzae NOT DETECTED NOT DETECTED Final   Neisseria meningitidis NOT DETECTED NOT DETECTED Final   Pseudomonas aeruginosa NOT DETECTED NOT DETECTED Final   Candida albicans NOT DETECTED NOT DETECTED Final   Candida glabrata NOT DETECTED NOT DETECTED Final   Candida krusei NOT DETECTED NOT DETECTED Final   Candida parapsilosis NOT DETECTED NOT DETECTED Final   Candida tropicalis NOT DETECTED NOT DETECTED Final    Comment: Performed at [facility], [place] [address], [place], [place] [address]  Culture, blood (Routine x 2)     Status: Abnormal (Preliminary result)   Collection Time: 05/10/20  8:00 PM   Specimen: BLOOD LEFT ARM  Result Value Ref Range Status   Specimen Description BLOOD LEFT ARM  Final   Special Requests   Final    BOTTLES DRAWN AEROBIC ONLY Blood Culture adequate volume   Culture  Setup Time   Final    GRAM NEGATIVE RODS AEROBIC BOTTLE ONLY Organism ID to follow CRITICAL VALUE NOTED.  VALUE IS CONSISTENT WITH PREVIOUSLY REPORTED AND CALLED VALUE. Performed at [facility], [place] [address] [place], [place] [address]    Culture PROTEUS VULGARIS (A)  Final   Report Status PENDING  Incomplete  SARS Coronavirus 2 by RT PCR (hospital order, performed in [facility] hospital lab) Nasopharyngeal Nasopharyngeal Swab     Status: None   Collection Time: 05/10/20  9:27 PM   Specimen: Nasopharyngeal Swab  Result Value Ref Range Status   SARS Coronavirus 2 NEGATIVE NEGATIVE Final     Comment: (NOTE) SARS-CoV-2 target nucleic acids are NOT DETECTED.  The SARS-CoV-2 RNA is generally detectable in upper and lower respiratory specimens during the acute phase of infection. The lowest concentration of SARS-CoV-2 viral copies this assay can detect is 250 copies / mL. A negative result does not preclude SARS-CoV-2 infection and should not be used as the sole basis for treatment or other patient management decisions.  A negative result may occur with improper specimen collection / handling, submission of specimen other than nasopharyngeal swab, presence of viral mutation(s) within the areas targeted by this assay, and inadequate number of viral copies (<250 copies / mL). A negative result must be combined with clinical observations, patient history, and epidemiological information.  Fact Sheet for Patients:   StrictlyIdeas.no  Fact Sheet for Healthcare Providers: BankingDealers.co.za  This test is not yet approved or  cleared by the [place] FDA and has been authorized for detection and/or diagnosis of SARS-CoV-2 by FDA under an Emergency Use Authorization (EUA).  This EUA will remain in effect (meaning this test can be used) for the duration of the COVID-19 declaration under Section 564(b)(1) of the Act, 21 U.S.C. section 360bbb-3(b)(1), unless the authorization is terminated or revoked sooner.  Performed at [facility], [place] [address] [place], [place] [address]   Urine culture     Status: None   Collection Time: 05/10/20 10:37 PM   Specimen: Urine, Clean Catch  Result Value Ref Range Status   Specimen Description URINE, CLEAN CATCH  Final   Special Requests NONE  Final   Culture   Final    NO GROWTH Performed at [facility], [place] [address], [place], [place] [address]    Report Status 05/11/2020 FINAL  Final   Creatinine: Recent Labs    05/10/20 2009 05/11/20 0437 05/12/20 0353  CREATININE  1.54* 1.23 1.03   CT of  the abdomen and pelvis revealed normal kidney and ureters.  There was mild diffuse bladder wall thickening which is nonspecific.  Procedure: Under sterile conditions, I advanced an 28 French coud catheter into the urethra and into the bladder.  There was return of clear yellow urine.  I instilled 10 cc of sterile water into the catheter balloon and placed it to gravity drainage.  Impression/Assessment:  Urinary retention Proteus bacteremia  Plan:  Continue Foley catheter for at least 1 week.  He will need to follow-up in our clinic for voiding trial.  Continue Flomax  Marton Redwood, III 05/12/2020, 12:23 PM

## 2020-05-12 NOTE — Consult Note (Signed)
Referring Provider: Dr. Lyndee Hensen Primary Care Physician:  [name], MD Primary Gastroenterologist:  Althia Forts  Reason for Consultation:  Cirrhosis  HPI: Jim Little is a 74 y.o. male with medical history pertinent for cirrhosis currently hospitalized for sepsis and confusion presenting for consultation of cirrhosis.  Patient denies knowledge of diagnosis of cirrhosis or prior liver disease.  He does note some recent abdominal distention and discomfort.  Denies any hematemesis or rectal bleeding.  Has not had a bowel movement today.  Only prior outpatient medications were for Malaria.  Denies any alcohol use for his entire life.  Denies known Hepatitis B or C infections in the past.  Had an EGD in the past, per chart review: grade 2 esophageal varices (banded) and candidiasis.  Denies history of paracentesis.  Denies history of colonoscopy.  Denies family history of liver disease or liver cancer.  He's also had recent urinary retention and blood culture positive for Proteus (thought to be urinary source) but urine culture was negative.  [employer] video interpreting services utilized for this encounter Lannette Donath, [phone])  Past Medical History:  Diagnosis Date  . BPH (benign prostatic hyperplasia)   . Cirrhosis ([place])   . Splenomegaly     Past Surgical History:  Procedure Laterality Date  . UPPER GASTROINTESTINAL ENDOSCOPY  03/2018   [place] --candidiasis plus esophageal varices grade 2.  Treated with fluconazole and status post esophageal banding.  Marland Kitchen US ECHOCARDIOGRAPHY  10/2016   per overseas records from [place] - LVEF 68%; mildly calcified aortic valve, normal function otherwise    Prior to Admission medications   Medication Sig Start Date End Date Taking? Authorizing Provider  azelastine (OPTIVAR) 0.05 % ophthalmic solution Place 1 drop into both eyes 2 (two) times daily. 01/31/20   [name], MD  fluticasone (FLONASE) 50 MCG/ACT nasal spray Place 2  sprays into both nostrils daily. 03/06/20   [name], MD  Olopatadine HCl 0.2 % SOLN Apply 1 drop to eye daily. 03/06/20   [name], MD  tamsulosin (FLOMAX) 0.4 MG CAPS capsule Take 1 capsule (0.4 mg total) by mouth daily after supper. 01/31/20   [name], MD  traMADol (ULTRAM) 50 MG tablet Take 1 tablet (50 mg total) by mouth every 8 (eight) hours as needed. 07/15/18   Alveda Reasons, MD    Scheduled Meds: . Chlorhexidine Gluconate Cloth  6 each Topical Daily  . famotidine  20 mg Oral Daily  . tamsulosin  0.4 mg Oral Daily   Continuous Infusions: . sodium chloride 75 mL/hr at 05/12/20 0902  . cefTRIAXone (ROCEPHIN)  IV Stopped (05/11/20 2154)   PRN Meds:.acetaminophen **OR** acetaminophen, sucralfate  Allergies as of 05/10/2020  . (No Known Allergies)    No family history on file.  Social History   Socioeconomic History  . Marital status: Married    Spouse name: Not on file  . Number of children: Not on file  . Years of education: Not on file  . Highest education level: Not on file  Occupational History  . Not on file  Tobacco Use  . Smoking status: Never Smoker  . Smokeless tobacco: Never Used  Substance and Sexual Activity  . Alcohol use: Never  . Drug use: Not on file  . Sexual activity: Not on file  Other Topics Concern  . Not on file  Social History Narrative  . Not on file   Social Determinants of Health   Financial Resource Strain:   .  Difficulty of Paying Living Expenses:   Food Insecurity:   . Worried About Charity fundraiser in the Last Year:   . Arboriculturist in the Last Year:   Transportation Needs:   . Film/video editor (Medical):   Marland Kitchen Lack of Transportation (Non-Medical):   Physical Activity:   . Days of Exercise per Week:   . Minutes of Exercise per Session:   Stress:   . Feeling of Stress :   Social Connections:   . Frequency of Communication with Friends and Family:   . Frequency of Social Gatherings with  Friends and Family:   . Attends Religious Services:   . Active Member of Clubs or Organizations:   . Attends Archivist Meetings:   Marland Kitchen Marital Status:   Intimate Partner Violence:   . Fear of Current or Ex-Partner:   . Emotionally Abused:   Marland Kitchen Physically Abused:   . Sexually Abused:     Review of Systems: Review of Systems  Constitutional: Negative for chills and fever.  HENT: Negative for hearing loss and tinnitus.   Eyes: Negative for pain and redness.  Respiratory: Negative for cough and shortness of breath.   Cardiovascular: Negative for chest pain and palpitations.  Gastrointestinal: Positive for abdominal pain. Negative for blood in stool, constipation, diarrhea, heartburn, melena, nausea and vomiting.  Genitourinary: Positive for frequency (decreased) and hematuria.  Musculoskeletal: Negative for joint pain and neck pain.  Neurological: Negative for seizures and loss of consciousness.  Endo/Heme/Allergies: Negative for polydipsia. Does not bruise/bleed easily.  Psychiatric/Behavioral: Negative for substance abuse. The patient is not nervous/anxious.      Physical Exam: Vital signs: Vitals:   05/12/20 0401 05/12/20 0743  BP: (!) 150/76 (!) 172/91  Pulse: 67 71  Resp: 14 20  Temp: 98.1 F (36.7 C) 97.8 F (36.6 C)  SpO2: 100% 99%   Last BM Date: 05/11/20 Physical Exam Constitutional:      General: He is not in acute distress.    Appearance: Normal appearance.  HENT:     Head: Normocephalic and atraumatic.     Mouth/Throat:     Mouth: Mucous membranes are moist.     Pharynx: Oropharynx is clear.  Eyes:     General: No scleral icterus.    Extraocular Movements: Extraocular movements intact.  Cardiovascular:     Rate and Rhythm: Normal rate and regular rhythm.     Pulses: Normal pulses.     Heart sounds: Normal heart sounds.  Abdominal:     General: Bowel sounds are normal. There is no distension.     Palpations: Abdomen is soft. There is no mass.      Tenderness: There is abdominal tenderness (suprapubic). There is no guarding or rebound.     Hernia: No hernia is present.  Genitourinary:    Comments: Urinary catheter in place with thick pink urine Musculoskeletal:        General: No tenderness.     Cervical back: Normal range of motion and neck supple.     Right lower leg: No edema.     Left lower leg: No edema.  Skin:    General: Skin is warm and dry.  Neurological:     General: No focal deficit present.     Mental Status: He is alert and oriented to person, place, and time.     Comments: No asterixis  Psychiatric:        Mood and Affect: Mood normal.  Behavior: Behavior normal.      GI:  Lab Results: Recent Labs    05/10/20 2009 05/11/20 0437 05/12/20 0353  WBC 11.7* 9.7 7.3  HGB 13.8 12.5* 11.7*  HCT 41.9 36.7* 34.7*  PLT 50* 37* 41*   BMET Recent Labs    05/10/20 2009 05/11/20 0437 05/12/20 0353  NA 135 135 138  K 4.9 4.3 4.2  CL 103 105 111  CO2 21* 22 23  GLUCOSE 174* 109* 80  BUN 23 19 20   CREATININE 1.54* 1.23 1.03  CALCIUM 8.9 8.4* 8.4*   LFT Recent Labs    05/12/20 0353  PROT 6.0*  ALBUMIN 2.2*  AST 36  ALT 21  ALKPHOS 85  BILITOT 1.1   PT/INR Recent Labs    05/10/20 2009 05/11/20 0437  LABPROT 18.6* 18.1*  INR 1.6* 1.6*     Studies/Results: CT ABDOMEN PELVIS WO CONTRAST  Result Date: 05/11/2020 CLINICAL DATA:  Nausea and vomiting, history of splenomegaly and cirrhosis EXAM: CT ABDOMEN AND PELVIS WITHOUT CONTRAST TECHNIQUE: Multidetector CT imaging of the abdomen and pelvis was performed following the standard protocol without IV contrast. COMPARISON:  May 05, 2018 FINDINGS: Lower chest: There is moderate cardiomegaly. No pericardial fluid/thickening. A small hiatal hernia is present. A small calcified nodule seen at the right lung base. Hepatobiliary: A slightly nodular liver contour is seen. No evidence of calcified gallstones or biliary ductal dilatation. Pancreas:   Unremarkable.  No surrounding inflammatory changes. Spleen: There is mild splenomegaly which measures 14 cm in craniocaudad dimension. Adrenals/Urinary Tract: Both adrenal glands appear normal. The kidneys and collecting system appear normal without evidence of urinary tract calculus or hydronephrosis. There is diffuse mild bladder wall thickening present. Stomach/Bowel: There appears to be a mild wall thickening and fat stranding changes seen at the gastroduodenal junction. No surrounding fluid collections however are noted. The remainder of the small bowel and colon are grossly unremarkable. Scattered colonic diverticula are noted without diverticulitis. Vascular/Lymphatic: There are no enlarged abdominal or pelvic lymph nodes. No significant gross vascular findings are present. Reproductive:  The prostate is unremarkable. Other: No evidence of abdominal wall mass or hernia. Musculoskeletal: No acute or significant osseous findings. Degenerative changes are seen at the thoracolumbar spine with large anterior osteophytes. Bridging osteophytes seen at L5-S1. IMPRESSION: 1. Mild wall thickening with adjacent inflammatory changes at the gastroduodenal junction which could be due to mild gastritis. No surrounding fluid collections or free air. 2. Findings which could be suggestive of mild chronic cystitis. 3. Diverticulosis without diverticulitis. Electronically Signed   By: Prudencio Pair M.D.   On: 05/11/2020 00:47   DG Chest 2 View  Result Date: 05/10/2020 CLINICAL DATA:  Suspected sepsis,. Speaking and responding 2 days prior, poor historian EXAM: CHEST - 2 VIEW COMPARISON:  Radiograph 05/01/2018, CT 07/22/2018 FINDINGS: Streaky changes in the lung bases likely reflecting atelectasis with mild central vascular crowding. No consolidation, features of edema, pneumothorax, or effusion. Cardiomediastinal contours are unremarkable for the portable technique. Lucency of right subdiaphragmatic air is likely related to  an air-filled loop of colon seen on lateral radiograph. No convincing evidence of free subdiaphragmatic air. No acute osseous abnormality. Degenerative changes are present in the imaged spine and shoulders. IMPRESSION: Streaky changes in the lung bases likely reflecting atelectasis. No other acute cardiopulmonary abnormality. Electronically Signed   By: Lovena Le M.D.   On: 05/10/2020 20:30   CT Head Wo Contrast  Result Date: 05/11/2020 CLINICAL DATA:  Altered mental status EXAM: CT  HEAD WITHOUT CONTRAST TECHNIQUE: Contiguous axial images were obtained from the base of the skull through the vertex without intravenous contrast. COMPARISON:  None. FINDINGS: Brain: No evidence of acute territorial infarction, hemorrhage, hydrocephalus,extra-axial collection or mass lesion/mass effect. There is dilatation the ventricles and sulci consistent with age-related atrophy. Low-attenuation changes in the deep white matter consistent with small vessel ischemia. Vascular: No hyperdense vessel or unexpected calcification. Skull: The skull is intact. No fracture or focal lesion identified. Sinuses/Orbits: The visualized paranasal sinuses and mastoid air cells are clear. The orbits and globes intact. Other: None IMPRESSION: No acute intracranial abnormality. Findings consistent with age related atrophy and chronic small vessel ischemia Electronically Signed   By: Prudencio Pair M.D.   On: 05/11/2020 00:44   US LIVER DOPPLER  Result Date: 05/11/2020 CLINICAL DATA:  Cirrhosis. EXAM: LIMITED ABDOMINAL ULTRASOUND US HEPATIC DOPPLER ARTERIAL/VENOUS ULTRASOUND COMPARISON:  CT from earlier the same day, and prior studies FINDINGS: Gallbladder: Physiologically distended without stones, wall thickening, or pericholecystic fluid. Sonographer reports no sonographic Murphy's sign. Common bile duct:  3.3 mm diameter. Liver: Lobular, heterogenous echotexture. 1.2 x 1 cm echogenic lesion in the left lobe. 2.1 x 2 cm echogenic lesion in  the posterior right lobe. No biliary ductal dilatation. Portal Vein: No occlusion or thrombus. Velocities (all hepatopetal): Main:  17-22 cm/sec Right: Limited evaluation, patent. Technologist describes technically difficult study secondary to patient unable to breath-hold during the examination. Left: Limited evaluation, patent Hepatic Vein Velocities (all hepatofugal): Right:  34 cm/sec Middle:  71 cm/sec Left:  65 cm/sec Hepatic Artery Velocity: 60 cm/sec IVC:  51 cm/sec Spleen:  No focal lesion, 11.7 x 5.3 x 8.9 cm (290 cc). Splenic Vein: No occlusion or thrombus.  Velocity: 34 cm/sec Right Kidney:  No mass or hydronephrosis, cm in length. Left Kidney:  No lesion or hydronephrosis, cm in length. Abdominal aorta:  Negative Varices: None identified Ascites: Absent IMPRESSION: 1. Unremarkable hepatic vascular Doppler evaluation, although velocities in the intrahepatic portal veins could not be obtained. 2. 2 echogenic liver lesions, without definite correlate on prior studies. Considerations include hemangiomas versus multifocal hepatocellular carcinoma. Consider hepatic MRI with contrast for further characterization. Electronically Signed   By: Lucrezia Europe M.D.   On: 05/11/2020 16:37   US Abdomen Limited RUQ  Result Date: 05/11/2020 CLINICAL DATA:  Cirrhosis. EXAM: LIMITED ABDOMINAL ULTRASOUND US HEPATIC DOPPLER ARTERIAL/VENOUS ULTRASOUND COMPARISON:  CT from earlier the same day, and prior studies FINDINGS: Gallbladder: Physiologically distended without stones, wall thickening, or pericholecystic fluid. Sonographer reports no sonographic Murphy's sign. Common bile duct:  3.3 mm diameter. Liver: Lobular, heterogenous echotexture. 1.2 x 1 cm echogenic lesion in the left lobe. 2.1 x 2 cm echogenic lesion in the posterior right lobe. No biliary ductal dilatation. Portal Vein: No occlusion or thrombus. Velocities (all hepatopetal): Main:  17-22 cm/sec Right: Limited evaluation, patent. Technologist describes  technically difficult study secondary to patient unable to breath-hold during the examination. Left: Limited evaluation, patent Hepatic Vein Velocities (all hepatofugal): Right:  34 cm/sec Middle:  71 cm/sec Left:  65 cm/sec Hepatic Artery Velocity: 60 cm/sec IVC:  51 cm/sec Spleen:  No focal lesion, 11.7 x 5.3 x 8.9 cm (290 cc). Splenic Vein: No occlusion or thrombus.  Velocity: 34 cm/sec Right Kidney:  No mass or hydronephrosis, cm in length. Left Kidney:  No lesion or hydronephrosis, cm in length. Abdominal aorta:  Negative Varices: None identified Ascites: Absent IMPRESSION: 1. Unremarkable hepatic vascular Doppler evaluation, although velocities in the intrahepatic portal  veins could not be obtained. 2. 2 echogenic liver lesions, without definite correlate on prior studies. Considerations include hemangiomas versus multifocal hepatocellular carcinoma. Consider hepatic MRI with contrast for further characterization. Electronically Signed   By: Lucrezia Europe M.D.   On: 05/11/2020 16:37    Impression: Cirrhosis, unknown etiology, MELD 21 as of 05/10/20; liver lesions on imaging -US Liver Doppler 05/11/20: 2 echogenic liver lesions, hemangiomas versus multifocal hepatocellular carcinoma. -T. Bili 1.1/ AST 36/ ALT 21/ ALP 85 today, improved from T bili 1.8/ AST 47/ ALT 27/ ALP 141 on 6/16 -Hepatitis A Ab positive, Hepatitis B surface Ab and core Ab negative (non-immune) -Hepatitis C Ab negative in 2019  Urinary retention, Proteus bacteremia  Plan: Recommend liver MRI to evaluate for [place] (patinet denies any metal).  Additional labs ordered to further evaluate etiology of cirrhosis, as patient denies any alcohol use.  AFP, alpha-1 antitrypsin, ANA, ASMA, AMA, ceruloplasmin, ferritin, iron panel, hepatitis B surface antigen, and hepatitis C surface antibody ordered.  Diagnostic paracentesis not ordered, as there does not appear to be enough ascitic fluid present at this time.  [facility] will follow.    LOS: 1 day   Salley Slaughter  PA-C 05/12/2020, 11:26 AM  Contact #  [phone]

## 2020-05-12 NOTE — Progress Notes (Signed)
Dr. Caron Presume return call. He may be come see patient later this a.m. and attempt foley insertion. Patient is not in pain at this monment.

## 2020-05-13 LAB — CBC
HCT: 32.6 % — ABNORMAL LOW (ref 39.0–52.0)
Hemoglobin: 11.2 g/dL — ABNORMAL LOW (ref 13.0–17.0)
MCH: 33.4 pg (ref 26.0–34.0)
MCHC: 34.4 g/dL (ref 30.0–36.0)
MCV: 97.3 fL (ref 80.0–100.0)
Platelets: 51 10*3/uL — ABNORMAL LOW (ref 150–400)
RBC: 3.35 MIL/uL — ABNORMAL LOW (ref 4.22–5.81)
RDW: 15.2 % (ref 11.5–15.5)
WBC: 5.7 10*3/uL (ref 4.0–10.5)
nRBC: 0 % (ref 0.0–0.2)

## 2020-05-13 LAB — IRON AND TIBC
Iron: 156 ug/dL (ref 45–182)
Saturation Ratios: 75 % — ABNORMAL HIGH (ref 17.9–39.5)
TIBC: 207 ug/dL — ABNORMAL LOW (ref 250–450)
UIBC: 51 ug/dL

## 2020-05-13 LAB — BASIC METABOLIC PANEL
Anion gap: 6 (ref 5–15)
BUN: 15 mg/dL (ref 8–23)
CO2: 22 mmol/L (ref 22–32)
Calcium: 8.2 mg/dL — ABNORMAL LOW (ref 8.9–10.3)
Chloride: 108 mmol/L (ref 98–111)
Creatinine, Ser: 0.83 mg/dL (ref 0.61–1.24)
GFR calc Af Amer: >60 mL/min (ref >60)
GFR calc non Af Amer: >60 mL/min (ref >60)
Glucose, Bld: 95 mg/dL (ref 70–99)
Potassium: 3.6 mmol/L (ref 3.5–5.1)
Sodium: 136 mmol/L (ref 135–145)

## 2020-05-13 LAB — FERRITIN: Ferritin: 262 ng/mL (ref 24–336)

## 2020-05-13 LAB — HEPATITIS C ANTIBODY: HCV Ab: NONREACTIVE

## 2020-05-13 LAB — CULTURE, BLOOD (ROUTINE X 2)
Special Requests: ADEQUATE
Special Requests: ADEQUATE

## 2020-05-13 LAB — HEPATITIS B SURFACE ANTIGEN: Hepatitis B Surface Ag: NONREACTIVE

## 2020-05-13 LAB — VITAMIN B12: Vitamin B-12: 724 pg/mL (ref 180–914)

## 2020-05-13 LAB — FOLATE: Folate: 5.8 ng/mL — ABNORMAL LOW (ref >5.9)

## 2020-05-13 MED ORDER — FOLIC ACID 1 MG PO TABS
1.0000 mg | ORAL_TABLET | Freq: Every day | ORAL | Status: DC
  Administered 2020-05-13 – 2020-05-14 (×2): 1 mg via ORAL
  Filled 2020-05-13 (×2): qty 1

## 2020-05-13 MED ORDER — AMLODIPINE BESYLATE 5 MG PO TABS
5.0000 mg | ORAL_TABLET | Freq: Every day | ORAL | Status: DC
  Administered 2020-05-13 – 2020-05-14 (×2): 5 mg via ORAL
  Filled 2020-05-13 (×2): qty 1

## 2020-05-13 MED ORDER — ALUM & MAG HYDROXIDE-SIMETH 200-200-20 MG/5ML PO SUSP: 15.0000 mL | Freq: Four times a day (QID) | ORAL | Status: DC | PRN

## 2020-05-13 NOTE — Progress Notes (Signed)
Subjective: [employer] interpreter used for Swahili language. Patient denies abdominal pain, nausea or vomiting.  Objective: Vital signs in last 24 hours: Temp:  [97.6 F (36.4 C)-99.8 F (37.7 C)] 99.8 F (37.7 C) (06/19 0922) Pulse Rate:  [70-78] 78 (06/19 0327) Resp:  [15-21] 21 (06/19 0327) BP: (161-175)/(77-87) 166/77 (06/19 0327) SpO2:  [97 %-98 %] 97 % (06/19 0327) Weight change:  Last BM Date: 05/11/20  PE: Appears comfortable, lying on bed GENERAL: No pallor, no icterus ABDOMEN: Soft, nondistended, nontender, normoactive bowel sounds EXTREMITIES: No edema  Lab Results: Results for orders placed or performed during the hospital encounter of 05/10/20 (from the past 48 hour(s))  Hemoglobin A1c     Status: None   Collection Time: 05/12/20  3:53 AM  Result Value Ref Range   Hgb A1c MFr Bld 5.2 4.8 - 5.6 %    Comment: (NOTE)         Prediabetes: 5.7 - 6.4         Diabetes: >6.4         Glycemic control for adults with diabetes: <7.0    Mean Plasma Glucose 103 mg/dL    Comment: (NOTE) Performed At: [facility] [place], [place] [id number] Rush Farmer MD TD:[id number]   Comprehensive metabolic panel     Status: Abnormal   Collection Time: 05/12/20  3:53 AM  Result Value Ref Range   Sodium 138 135 - 145 mmol/L   Potassium 4.2 3.5 - 5.1 mmol/L   Chloride 111 98 - 111 mmol/L   CO2 23 22 - 32 mmol/L   Glucose, Bld 80 70 - 99 mg/dL    Comment: Glucose reference range applies only to samples taken after fasting for at least 8 hours.   BUN 20 8 - 23 mg/dL   Creatinine, Ser 1.03 0.61 - 1.24 mg/dL   Calcium 8.4 (L) 8.9 - 10.3 mg/dL   Total Protein 6.0 (L) 6.5 - 8.1 g/dL   Albumin 2.2 (L) 3.5 - 5.0 g/dL   AST 36 15 - 41 U/L   ALT 21 0 - 44 U/L   Alkaline Phosphatase 85 38 - 126 U/L   Total Bilirubin 1.1 0.3 - 1.2 mg/dL   GFR calc non Af Amer >60 >60 mL/min   GFR calc Af Amer >60 >60 mL/min   Anion gap 4 (L) 5 - 15    Comment: Performed at  [facility] [address] [place], [place] [address]  CBC with Differential/Platelet     Status: Abnormal   Collection Time: 05/12/20  3:53 AM  Result Value Ref Range   WBC 7.3 4.0 - 10.5 K/uL   RBC 3.46 (L) 4.22 - 5.81 MIL/uL   Hemoglobin 11.7 (L) 13.0 - 17.0 g/dL   HCT 34.7 (L) 39 - 52 %   MCV 100.3 (H) 80.0 - 100.0 fL   MCH 33.8 26.0 - 34.0 pg   MCHC 33.7 30.0 - 36.0 g/dL   RDW 15.8 (H) 11.5 - 15.5 %   Platelets 41 (L) 150 - 400 K/uL    Comment: CONSISTENT WITH PREVIOUS RESULT Immature Platelet Fraction may be clinically indicated, consider ordering this additional test [id number] REPEATED TO VERIFY    nRBC 0.0 0.0 - 0.2 %   Neutrophils Relative % 58 %   Neutro Abs 4.2 1.7 - 7.7 K/uL   Lymphocytes Relative 23 %   Lymphs Abs 1.7 0.7 - 4.0 K/uL   Monocytes Relative 17 %   Monocytes Absolute  1.3 (H) 0 - 1 K/uL   Eosinophils Relative 1 %   Eosinophils Absolute 0.1 0 - 0 K/uL   Basophils Relative 0 %   Basophils Absolute 0.0 0 - 0 K/uL   Immature Granulocytes 1 %   Abs Immature Granulocytes 0.05 0.00 - 0.07 K/uL    Comment: Performed at [facility], [place] [address], [place], [place] [address]  HIV Antibody (routine testing w rflx)     Status: None   Collection Time: 05/12/20  3:53 AM  Result Value Ref Range   HIV Screen 4th Generation wRfx Non Reactive Non Reactive    Comment: Performed at [facility], [place] [address] [place], [place] [address]  CBC     Status: Abnormal   Collection Time: 05/13/20  3:45 AM  Result Value Ref Range   WBC 5.7 4.0 - 10.5 K/uL   RBC 3.35 (L) 4.22 - 5.81 MIL/uL   Hemoglobin 11.2 (L) 13.0 - 17.0 g/dL   HCT 32.6 (L) 39 - 52 %   MCV 97.3 80.0 - 100.0 fL   MCH 33.4 26.0 - 34.0 pg   MCHC 34.4 30.0 - 36.0 g/dL   RDW 15.2 11.5 - 15.5 %   Platelets 51 (L) 150 - 400 K/uL    Comment: CONSISTENT WITH PREVIOUS RESULT Immature Platelet Fraction may be clinically indicated, consider ordering this additional  test [id number] REPEATED TO VERIFY    nRBC 0.0 0.0 - 0.2 %    Comment: Performed at [facility], [place] [address] [place], [place] [address]  Basic metabolic panel     Status: Abnormal   Collection Time: 05/13/20  3:45 AM  Result Value Ref Range   Sodium 136 135 - 145 mmol/L   Potassium 3.6 3.5 - 5.1 mmol/L   Chloride 108 98 - 111 mmol/L   CO2 22 22 - 32 mmol/L   Glucose, Bld 95 70 - 99 mg/dL    Comment: Glucose reference range applies only to samples taken after fasting for at least 8 hours.   BUN 15 8 - 23 mg/dL   Creatinine, Ser 0.83 0.61 - 1.24 mg/dL   Calcium 8.2 (L) 8.9 - 10.3 mg/dL   GFR calc non Af Amer >60 >60 mL/min   GFR calc Af Amer >60 >60 mL/min   Anion gap 6 5 - 15    Comment: Performed at [facility] [address], [place], [place] [address]  Folate, serum, performed at [facility] lab     Status: Abnormal   Collection Time: 05/13/20  3:45 AM  Result Value Ref Range   Folate 5.8 (L) >5.9 ng/mL    Comment: Performed at [facility], [place] [address] [place], [place] [address]  Vitamin B12     Status: None   Collection Time: 05/13/20  3:45 AM  Result Value Ref Range   Vitamin B-12 724 180 - 914 pg/mL    Comment: (NOTE) This assay is not validated for testing neonatal or myeloproliferative syndrome specimens for Vitamin B12 levels. Performed at [facility], [place] [address] [place], [place] [address]   Hepatitis C antibody     Status: None   Collection Time: 05/13/20  3:45 AM  Result Value Ref Range   HCV Ab NON REACTIVE NON REACTIVE    Comment: (NOTE) Nonreactive HCV antibody screen is consistent with no HCV infections,  unless recent infection is suspected or other evidence exists to indicate HCV  infection.  Performed at [facility], [place] [address] [place], [place] [address]   Hepatitis B surface antigen     Status: None   Collection Time: 05/13/20  3:45 AM  Result Value Ref Range   Hepatitis B Surface Ag NON REACTIVE  NON REACTIVE    Comment: Performed at [facility] [address], [place], [place] [address]  Ferritin     Status: None   Collection Time: 05/13/20  3:45 AM  Result Value Ref Range   Ferritin 262 24 - 336 ng/mL    Comment: Performed at [facility], [place] [address] [place], [place] [address]  Iron and TIBC     Status: Abnormal   Collection Time: 05/13/20  3:45 AM  Result Value Ref Range   Iron 156 45 - 182 ug/dL   TIBC 207 (L) 250 - 450 ug/dL   Saturation Ratios 75 (H) 17.9 - 39.5 %   UIBC 51 ug/dL    Comment: Performed at [facility], [place] [address] [place], [place] [address]    Studies/Results: MR LIVER W WO CONTRAST  Result Date: 05/13/2020 CLINICAL DATA:  Indeterminate hepatic lesions on comparison ultrasound. MRI recommended for further characterization EXAM: MRI ABDOMEN WITHOUT AND WITH CONTRAST TECHNIQUE: Multiplanar multisequence MR imaging of the abdomen was performed both before and after the administration of intravenous contrast. CONTRAST:  6.67mL GADAVIST GADOBUTROL 1 MMOL/ML IV SOLN COMPARISON:  Ultrasound 05/11/2020. FINDINGS: Lower chest: Small bilateral pleural effusions. Mild RIGHT basilar atelectasis Hepatobiliary: There is loss signal intensity within the liver on opposed phase imaging consistent with hepatic steatosis. There is focal loss of signal intensity in the inferior medial aspect of the RIGHT hepatic lobe in the region measuring 3.3 by 2.8 cm (image 16/12. This increased loss signal intensity suggest focal fatty infiltration. There is however some peripheral enhancement through this lesion with more focal enhancement noted on image 34/16. While focal fatty infiltration can have post-contrast enhancement enhancement this is unusual reticular pattern of enhancement. Additional enhancement node on image 34/1024 delayed imaging. NO ADDITIONAL HEPATIC LESIONS ARE IDENTIFIED. LIVER DOES HAVE A LOBULAR CONTOUR SUGGESTING CIRRHOSIS. Pancreas: Normal  pancreatic parenchymal intensity. No ductal dilatation or inflammation. Spleen: Normal spleen. Adrenals/urinary tract: Adrenal glands and kidneys are normal. Stomach/Bowel: Stomach and limited of the small bowel is unremarkable Vascular/Lymphatic: Abdominal aortic normal caliber. No retroperitoneal periportal lymphadenopathy. Musculoskeletal: No aggressive osseous lesion IMPRESSION: 1. Geographic lesion in the inferior medial aspect of the RIGHT hepatic lobe has some imaging features suggesting focal fatty infiltration. However, atypical enhancement pattern. Recommend follow-up contrast MRI in 3 to 6 months to re-evaluate. 2. No additional lesion liver. 3. Morphologic changes consistent with cirrhosis. Electronically Signed   By: Suzy Bouchard M.D.   On: 05/13/2020 09:06   US LIVER DOPPLER  Result Date: 05/11/2020 CLINICAL DATA:  Cirrhosis. EXAM: LIMITED ABDOMINAL ULTRASOUND US HEPATIC DOPPLER ARTERIAL/VENOUS ULTRASOUND COMPARISON:  CT from earlier the same day, and prior studies FINDINGS: Gallbladder: Physiologically distended without stones, wall thickening, or pericholecystic fluid. Sonographer reports no sonographic Murphy's sign. Common bile duct:  3.3 mm diameter. Liver: Lobular, heterogenous echotexture. 1.2 x 1 cm echogenic lesion in the left lobe. 2.1 x 2 cm echogenic lesion in the posterior right lobe. No biliary ductal dilatation. Portal Vein: No occlusion or thrombus. Velocities (all hepatopetal): Main:  17-22 cm/sec Right: Limited evaluation, patent. Technologist describes technically difficult study secondary to patient unable to breath-hold during the examination. Left: Limited evaluation, patent  Hepatic Vein Velocities (all hepatofugal): Right:  34 cm/sec Middle:  71 cm/sec Left:  65 cm/sec Hepatic Artery Velocity: 60 cm/sec IVC:  51 cm/sec Spleen:  No focal lesion, 11.7 x 5.3 x 8.9 cm (290 cc). Splenic Vein: No occlusion or thrombus.  Velocity: 34 cm/sec Right Kidney:  No mass or  hydronephrosis, cm in length. Left Kidney:  No lesion or hydronephrosis, cm in length. Abdominal aorta:  Negative Varices: None identified Ascites: Absent IMPRESSION: 1. Unremarkable hepatic vascular Doppler evaluation, although velocities in the intrahepatic portal veins could not be obtained. 2. 2 echogenic liver lesions, without definite correlate on prior studies. Considerations include hemangiomas versus multifocal hepatocellular carcinoma. Consider hepatic MRI with contrast for further characterization. Electronically Signed   By: Lucrezia Europe M.D.   On: 05/11/2020 16:37   US Abdomen Limited RUQ  Result Date: 05/11/2020 CLINICAL DATA:  Cirrhosis. EXAM: LIMITED ABDOMINAL ULTRASOUND US HEPATIC DOPPLER ARTERIAL/VENOUS ULTRASOUND COMPARISON:  CT from earlier the same day, and prior studies FINDINGS: Gallbladder: Physiologically distended without stones, wall thickening, or pericholecystic fluid. Sonographer reports no sonographic Murphy's sign. Common bile duct:  3.3 mm diameter. Liver: Lobular, heterogenous echotexture. 1.2 x 1 cm echogenic lesion in the left lobe. 2.1 x 2 cm echogenic lesion in the posterior right lobe. No biliary ductal dilatation. Portal Vein: No occlusion or thrombus. Velocities (all hepatopetal): Main:  17-22 cm/sec Right: Limited evaluation, patent. Technologist describes technically difficult study secondary to patient unable to breath-hold during the examination. Left: Limited evaluation, patent Hepatic Vein Velocities (all hepatofugal): Right:  34 cm/sec Middle:  71 cm/sec Left:  65 cm/sec Hepatic Artery Velocity: 60 cm/sec IVC:  51 cm/sec Spleen:  No focal lesion, 11.7 x 5.3 x 8.9 cm (290 cc). Splenic Vein: No occlusion or thrombus.  Velocity: 34 cm/sec Right Kidney:  No mass or hydronephrosis, cm in length. Left Kidney:  No lesion or hydronephrosis, cm in length. Abdominal aorta:  Negative Varices: None identified Ascites: Absent IMPRESSION: 1. Unremarkable hepatic vascular Doppler  evaluation, although velocities in the intrahepatic portal veins could not be obtained. 2. 2 echogenic liver lesions, without definite correlate on prior studies. Considerations include hemangiomas versus multifocal hepatocellular carcinoma. Consider hepatic MRI with contrast for further characterization. Electronically Signed   By: Lucrezia Europe M.D.   On: 05/11/2020 16:37    Medications: I have reviewed the patient's current medications.  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 Results pending for ASMA, AMA, alpha 1 antitrypsin, ceruloplasmin No evidence of ascites or encephalopathy  MRI showed focal fatty infiltration no additional liver lesion noted, morphologic changes consistent with cirrhosis   Plan: Patient will need outpatient follow-up. Complete work-up for cirrhosis including alpha-fetoprotein levels are pending, can be followed up as an outpatient. He will also need outpatient screening colonoscopy and endoscopy for screening for varices. At this point GI will sign off, advised patient to follow-up with me in 2 to 4 weeks after discharge. Please recall GI if needed.  Ronnette Juniper, MD 05/13/2020, 11:05 AM

## 2020-05-13 NOTE — Progress Notes (Signed)
Patient pulling on foley.Call interpreter Toribio Harbour and he spike with patient. Patient was worried it was not draining and he did not want to mess the bed up. Explain to patient it is fine and not to worry.

## 2020-05-13 NOTE — TOC Initial Note (Signed)
Transition of Care [facility]) - Initial/Assessment Note    Patient Details  Name: Jim Little MRN: [id number] Date of Birth: Nov 03, 1946  [facility]) CM/SW Contact:    Bary Castilla, LCSW Phone Number: [phone] 05/13/2020, 12:17 PM  Clinical Narrative:                  CSW met with patient and patient's son Mamoni through the assistance of an interpreter. CSW made them aware of the recommendation of a SNF. They had numerous questions due to this being a new concept to them. CSW answered their questions and they were concerned about visitation. CSW inquired about patient being vaccinated however it was unclear.  After much discussion, it was decided that patient will be going home with [facility]. It appeared that they were more comfortable with this option. They stated that patient stayed with his wife and adult children and has family support at home.  Due to the language barriers, CSW made an assertive effort to explain the process to them.   TOC team will continue to follow for discharge planning needs.       Patient Goals and CMS Choice        Expected Discharge Plan and Services                                                Prior Living Arrangements/Services                       Activities of Daily Living   ADL Screening (condition at time of admission) Patient's cognitive ability adequate to safely complete daily activities?: No  Permission Sought/Granted                  Emotional Assessment              Admission diagnosis:  Sepsis ([place]) [A41.9] Urinary tract infection without hematuria, site unspecified [N39.0] Sepsis, due to unspecified organism, unspecified whether acute organ dysfunction present [facility]) [A41.9] Patient Active Problem List   Diagnosis Date Noted  . Sepsis ([place]) 05/11/2020  . Urinary retention 05/11/2020  . Thrombocytopenia ([place]) 05/11/2020  . Bilateral cataracts 07/15/2018  . Refugee health  examination 04/16/2018  . Splenomegaly 04/16/2018  . Chronic back pain 04/16/2018  . Hepatic cirrhosis ([place]) 04/16/2018  . Lower urinary tract symptoms (LUTS) 04/16/2018  . HTN (hypertension) 04/16/2018   PCP:  [name], MD Pharmacy:   [facility] [place], [place] [address] [place] AT [address] [address] [place] [address] Phone: [phone] Fax: [phone]     Social Determinants of Health (SDOH) Interventions    Readmission Risk Interventions No flowsheet data found.

## 2020-05-13 NOTE — Progress Notes (Addendum)
[place] ID # [id number] ands son was used to do assessment and ask questions

## 2020-05-13 NOTE — Progress Notes (Signed)
Family Medicine Teaching Service Daily Progress Note Intern Pager: [phone]  Patient name: Jim Little Medical record number: [id number] Date of birth: May 26, 1946 Age: 74 y.o. Gender: male  Primary Care Provider: [name], MD Consultants: None Code Status: Full  Pt Overview and Major Events to Date:  05/11/20: Admitted 05/12/20: Right upper quadrant ultrasound, liver Dopplers  Assessment and Plan: Jim Little is a 74 y.o. male presenting with altered mental status 2/2 sepsis. PMH is significant for HTN, hepatic cirrhosis, history of cataracts, BPH.  Sepsis  Proteus Bacteremia  Continues to be afebrile, no leukocytosis.  Patient mental status is greatly improved.  Etiology of Proteus bacteremia still unknown as patient's urine culture was negative.  HIV negative. - Continue ceftriaxone, in addition to oral therapy in AM - Discontinued cefepime, Vanc, azithromycin - Blood cultures: Proteus - Follow-up Strongyloides IgG antibodies  Macrocytic anemia MCV 100.3, hemoglobin 11.7.  Likely secondary to poor intake.  Folate slightly low, will replete.  B12 within normal limits. -Folic acid supplementation -RD consulted, appreciate recommendations -Ensure TID   MSK chest and shoulder pain Improved.  Etiology likely MSK versus reflux patient reported sternal chest pain overnight that was reducible on exam.  EKG was not concerning for acute ischemia or ST elevations..   -Tylenol PRN -Pepcid 20 mg -Carafate -Maalox   Thrombocytopenia  Platelets 51 today slightly. From 50 on admission.  This is likely secondary to patient's cirrhotic liver disease compounded by patient's acute infection.. -Monitor with a AM CBC  -Avoid: Lovenox -SCDs for VTE prophylaxis -Consider immature platelet fraction  Cirrhosis  Has been worked up outpatient hep B nonimmune, previous hep A infection, negative hep C.  Patient with history of varices. Two echogenic liver lesions consistent with hemangioma  versus multifocal hepatocellular carcinoma. MR Liver suggeste focal fatty infiltration however was atypical enhancement pattern. -GI consulted, will continue work-up outpatient -Repeat contrast MR Liver in 3 to 6 months to reevaluate liver lesions  HTN Hypertensive this morning.  No home medications. -Continue monitoring -Start amlodipine 5 mg daily   Urinary retention  BPH  Urologist, Dr. Gloriann Loan, placed Foley catheter yesterday.  Foley to stay in place for at least 1 week and follow-up with urology for voiding trial outpatient. - Urology consulted, appreciate recommendations  - Continue Flomax  - Has outpt urology follow up on 05/18/20  -Follow-up A1c  AKI Resolved.  Creatinine on admission 1.5 and today 0.83.  -Avoid nephrotoxic agents -Continue IV fluids -Monitor with AM BMP   Lung nodule Small calcified nodule seen at right lung base with CT abd/pelvis. - consider outpatient CT chest to further evaluate  FEN/GI: soft diet, replete electrolytes as needed PPx: SCDs  Disposition: Pending medical optimization  Subjective:  Patient complaining of abdominal bloating after eating bananas.  Objective: Temp:  [97.6 F (36.4 C)-99.8 F (37.7 C)] 99.8 F (37.7 C) (06/19 0922) Pulse Rate:  [70-78] 78 (06/19 0327) Resp:  [15-21] 21 (06/19 0327) BP: (161-175)/(77-87) 166/77 (06/19 0327) SpO2:  [97 %-98 %] 97 % (06/19 0327) Physical Exam: General: Alert, resting comfortably in bed talking with his son, in no acute distress Cardiovascular: Regular rate and rhythm, distal pulses intact Respiratory: Lungs clear bilaterally, no increased work of breathing Abdomen: Mild abdominal distention, no palpable organomegaly, nontender, soft, present bowel sounds  Extremities: Thin, moving all extremities appropriately Neurological: Alert, oriented  Laboratory: Recent Labs  Lab 05/11/20 0437 05/12/20 0353 05/13/20 0345  WBC 9.7 7.3 5.7  HGB 12.5* 11.7* 11.2*  HCT 36.7* 34.7*  32.6*  PLT 37* 41* 51*   Recent Labs  Lab 05/10/20 2009 05/10/20 2009 05/11/20 0437 05/12/20 0353 05/13/20 0345  NA 135   < > 135 138 136  K 4.9   < > 4.3 4.2 3.6  CL 103   < > 105 111 108  CO2 21*   < > 22 23 22   BUN 23   < > 19 20 15   CREATININE 1.54*   < > 1.23 1.03 0.83  CALCIUM 8.9   < > 8.4* 8.4* 8.2*  PROT 7.8  --  6.6 6.0*  --   BILITOT 1.8*  --  1.7* 1.1  --   ALKPHOS 141*  --  103 85  --   ALT 27  --  23 21  --   AST 47*  --  40 36  --   GLUCOSE 174*   < > 109* 80 95   < > = values in this interval not displayed.        Imaging/Diagnostic Tests: MR LIVER W WO CONTRAST  Result Date: 05/13/2020 CLINICAL DATA:  Indeterminate hepatic lesions on comparison ultrasound. MRI recommended for further characterization EXAM: MRI ABDOMEN WITHOUT AND WITH CONTRAST TECHNIQUE: Multiplanar multisequence MR imaging of the abdomen was performed both before and after the administration of intravenous contrast. CONTRAST:  6.93mL GADAVIST GADOBUTROL 1 MMOL/ML IV SOLN COMPARISON:  Ultrasound 05/11/2020. FINDINGS: Lower chest: Small bilateral pleural effusions. Mild RIGHT basilar atelectasis Hepatobiliary: There is loss signal intensity within the liver on opposed phase imaging consistent with hepatic steatosis. There is focal loss of signal intensity in the inferior medial aspect of the RIGHT hepatic lobe in the region measuring 3.3 by 2.8 cm (image 16/12. This increased loss signal intensity suggest focal fatty infiltration. There is however some peripheral enhancement through this lesion with more focal enhancement noted on image 34/16. While focal fatty infiltration can have post-contrast enhancement enhancement this is unusual reticular pattern of enhancement. Additional enhancement node on image 34/1024 delayed imaging. NO ADDITIONAL HEPATIC LESIONS ARE IDENTIFIED. LIVER DOES HAVE A LOBULAR CONTOUR SUGGESTING CIRRHOSIS. Pancreas: Normal pancreatic parenchymal intensity. No ductal dilatation  or inflammation. Spleen: Normal spleen. Adrenals/urinary tract: Adrenal glands and kidneys are normal. Stomach/Bowel: Stomach and limited of the small bowel is unremarkable Vascular/Lymphatic: Abdominal aortic normal caliber. No retroperitoneal periportal lymphadenopathy. Musculoskeletal: No aggressive osseous lesion IMPRESSION: 1. Geographic lesion in the inferior medial aspect of the RIGHT hepatic lobe has some imaging features suggesting focal fatty infiltration. However, atypical enhancement pattern. Recommend follow-up contrast MRI in 3 to 6 months to re-evaluate. 2. No additional lesion liver. 3. Morphologic changes consistent with cirrhosis. Electronically Signed   By: Suzy Bouchard M.D.   On: 05/13/2020 09:06     Lyndee Hensen, DO 05/13/2020, 11:02 AM PGY-1, [place] Intern pager: [phone], text pages welcome

## 2020-05-14 DIAGNOSIS — R339 Retention of urine, unspecified: Secondary | ICD-10-CM

## 2020-05-14 DIAGNOSIS — K7460 Unspecified cirrhosis of liver: Secondary | ICD-10-CM

## 2020-05-14 LAB — CBC
HCT: 32 % — ABNORMAL LOW (ref 39.0–52.0)
Hemoglobin: 11.1 g/dL — ABNORMAL LOW (ref 13.0–17.0)
MCH: 34.3 pg — ABNORMAL HIGH (ref 26.0–34.0)
MCHC: 34.7 g/dL (ref 30.0–36.0)
MCV: 98.8 fL (ref 80.0–100.0)
Platelets: 61 10*3/uL — ABNORMAL LOW (ref 150–400)
RBC: 3.24 MIL/uL — ABNORMAL LOW (ref 4.22–5.81)
RDW: 15.1 % (ref 11.5–15.5)
WBC: 5 10*3/uL (ref 4.0–10.5)
nRBC: 0 % (ref 0.0–0.2)

## 2020-05-14 LAB — CERULOPLASMIN: Ceruloplasmin: 21.8 mg/dL (ref 16.0–31.0)

## 2020-05-14 LAB — ALPHA-1-ANTITRYPSIN: A-1 Antitrypsin, Ser: 181 mg/dL (ref 101–187)

## 2020-05-14 LAB — BASIC METABOLIC PANEL
Anion gap: 5 (ref 5–15)
BUN: 10 mg/dL (ref 8–23)
CO2: 25 mmol/L (ref 22–32)
Calcium: 8.1 mg/dL — ABNORMAL LOW (ref 8.9–10.3)
Chloride: 108 mmol/L (ref 98–111)
Creatinine, Ser: 0.47 mg/dL — ABNORMAL LOW (ref 0.61–1.24)
GFR calc Af Amer: >60 mL/min (ref >60)
GFR calc non Af Amer: >60 mL/min (ref >60)
Glucose, Bld: 101 mg/dL — ABNORMAL HIGH (ref 70–99)
Potassium: 3.7 mmol/L (ref 3.5–5.1)
Sodium: 138 mmol/L (ref 135–145)

## 2020-05-14 LAB — ANA W/REFLEX IF POSITIVE: Anti Nuclear Antibody (ANA): NEGATIVE

## 2020-05-14 MED ORDER — CEFDINIR 300 MG PO CAPS: 600.0000 mg | ORAL_CAPSULE | Freq: Every day | ORAL | Status: DC

## 2020-05-14 MED ORDER — FAMOTIDINE 20 MG PO TABS: 20.0000 mg | ORAL_TABLET | Freq: Every day | ORAL | 3 refills | Status: DC

## 2020-05-14 MED ORDER — AMLODIPINE BESYLATE 5 MG PO TABS: 5.0000 mg | ORAL_TABLET | Freq: Every day | ORAL | 3 refills | Status: DC

## 2020-05-14 MED ORDER — FOLIC ACID 1 MG PO TABS: 1.0000 mg | ORAL_TABLET | Freq: Every day | ORAL | 3 refills | Status: DC

## 2020-05-14 MED ORDER — TAMSULOSIN HCL 0.4 MG PO CAPS: 0.4000 mg | ORAL_CAPSULE | Freq: Every day | ORAL | 3 refills | Status: DC

## 2020-05-14 MED ORDER — CEFDINIR 300 MG PO CAPS: 600.0000 mg | ORAL_CAPSULE | Freq: Every day | ORAL | 0 refills | Status: AC

## 2020-05-14 NOTE — Progress Notes (Signed)
Family Medicine Teaching Service Daily Progress Note Intern Pager: [phone]  Patient name: Jim Little Medical record number: [id number] Date of birth: [birth year] Age: 74 y.o. Gender: male  Primary Care Provider: [name], MD Consultants: None Code Status: Full  Pt Overview and Major Events to Date:  05/11/20: Admitted 05/12/20: Right upper quadrant ultrasound, liver Dopplers  Assessment and Plan: Maddex Garlitz is a 74 y.o. male presenting with altered mental status 2/2 sepsis. PMH is significant for HTN, hepatic cirrhosis, history of cataracts, BPH.  Sepsis, resolved  Proteus Bacteremia, improved Altered mental status resolved, remains afebrile. Blood cultures growing Proteus.  Urine culture negative, suspect abdominal source. Patient has liver lesions and CT abdomen pelvis was notable for thickening at the gastroduodenal junction; known splenomegaly.  HIV negative. -GI consulted -signed off, recommend following up with labs and follow-up outpatient 2-4 weeks - SLP consulted :currently has soft diet - Continue ceftriaxone, will administer ceftriaxone at 4 PM today, then provide patient with cefuroxime 600 mg daily for the next 6 days starting 6/21 (through 6/26.) - Follow-up Strongyloides IgG antibodies  Macrocytic anemia, improved Folate low at 5.8, vitamin B12 within normal limits.  MCV 102 on admission, hemoglobin stable at 11.1. -Supplement folate 1 mg daily -RD consulted, appreciate recommendations  Chest Pain, resolved Etiology likely MSK versus reflux patient reported sternal chest pain overnight that was reducible on exam.  EKG was not concerning for acute ischemia or ST elevations..   -Tylenol PRN -Pepcid 20 mg -Carafate  Thrombocytosis, improving Platelets 50 on admission now 61.  Likely secondary to patient cirrhotic liver disease, acute infection. -Monitor with AM CBC  -Avoid Lovenox -SCDs for VTE prophylaxis  Hx of Cirrhosis  Has been worked up outpatient.   Patient with history of varices.  Patient referred to GI however missed his  appointment.  Unremarkable hepatic vascular Doppler evaluation though intrahepatic portal veins velocities cannot be obtained.  Of note, patient did have 2 echogenic liver lesions consistent with hemangioma versus multifocal hepatocellular carcinoma. -GI consulted - recommend close outpatient follow-up within 2-4 weeks -Consider nadolol versus propanolol  HTN BP 164/80 this morning. No home medications. -Continue monitoring -Consider propranolol given patient's history of varices.  Urinary retention, resolved with Foley  BPH, stable  Patient with known BPH, now on Flomax (was prescribed Flomax outpatient but was not taking).  - Urology consulted, appreciate recommendations - placed catheter, to stay in until outpatient follow-up with urology - Continue Flomax daily -Keep catheter in place until patient able to follow-up with urology - Has outpt urology follow up on 05/18/20   AKI, resolved Cr 1.5 on admission now low at 0.47.  HbA1c within normal limits at 5.2%. -Avoid nephrotoxic agents -Continue IV fluids -Monitor with AM BMP  Lung nodule Small calcified nodule seen at right lung base with CT abd/pelvis. - consider outpatient CT chest to further evaluate  FEN/GI: soft diet, replete electrolytes as needed PPx: SCDs  Disposition: Cleared for discharge today  Subjective:  Patient seen sitting upright in bed eating his breakfast, tolerating his breakfast well.  States he is having a little bit of back fatigue, soreness.  Was able to have a bowel movement.  Is asking about when he can go home.  Objective: Temp:  [97.7 F (36.5 C)-99.8 F (37.7 C)] 98.2 F (36.8 C) (06/20 0757) Pulse Rate:  [60-70] 60 (06/20 0757) Resp:  [16-19] 17 (06/20 0757) BP: (158-192)/(72-80) 164/80 (06/20 0500) SpO2:  [97 %-99 %] 99 % (06/20 0757) Physical Exam General:  Alert, sitting upright in bed eating breakfast, no  apparent distress Cardiovascular: Regular rate and rhythm, no murmurs appreciated Respiratory: Lungs clear to auscultation bilaterally, comfortable work of breathing Abdomen: Normal bowel sounds, soft, nontender to palpation, no distention appreciated Extremities: Moving all extremities appropriately, no deformity or edema  Laboratory: Recent Labs  Lab 05/12/20 0353 05/13/20 0345 05/14/20 0455  WBC 7.3 5.7 5.0  HGB 11.7* 11.2* 11.1*  HCT 34.7* 32.6* 32.0*  PLT 41* 51* 61*   Recent Labs  Lab 05/10/20 2009 05/10/20 2009 05/11/20 0437 05/11/20 0437 05/12/20 0353 05/13/20 0345 05/14/20 0455  NA 135   < > 135   < > 138 136 138  K 4.9   < > 4.3   < > 4.2 3.6 3.7  CL 103   < > 105   < > 111 108 108  CO2 21*   < > 22   < > 23 22 25   BUN 23   < > 19   < > 20 15 10   CREATININE 1.54*   < > 1.23   < > 1.03 0.83 0.47*  CALCIUM 8.9   < > 8.4*   < > 8.4* 8.2* 8.1*  PROT 7.8  --  6.6  --  6.0*  --   --   BILITOT 1.8*  --  1.7*  --  1.1  --   --   ALKPHOS 141*  --  103  --  85  --   --   ALT 27  --  23  --  21  --   --   AST 47*  --  40  --  36  --   --   GLUCOSE 174*   < > 109*   < > 80 95 101*   < > = values in this interval not displayed.     Imaging/Diagnostic Tests: No results found.   [name], DO 05/14/2020, 8:26 AM PGY-2, [place] Intern pager: [phone], text pages welcome

## 2020-05-14 NOTE — Discharge Summary (Addendum)
[facility] Discharge Summary  Patient name: Jim Little Medical record number: [id number] Date of birth: May 26, 1946 Age: 74 y.o. Gender: male Date of Admission: 05/10/2020  Date of Discharge: 05/14/2020 Admitting Physician: [name], MD  Primary Care Provider: [name], MD Consultants: Urology, Gastroenterology  Indication for Hospitalization:  Proteus bacteremia Toxic metabolic encephalopathy   Discharge Diagnoses/Problem List:  Patient Active Problem List   Diagnosis Date Noted  . Sepsis ([place]) 05/11/2020  . Urinary retention 05/11/2020  . Thrombocytopenia ([place]) 05/11/2020  . Bilateral cataracts 07/15/2018  . Refugee health examination 04/16/2018  . Splenomegaly 04/16/2018  . Chronic back pain 04/16/2018  . Hepatic cirrhosis ([place]) 04/16/2018  . Lower urinary tract symptoms (LUTS) 04/16/2018  . HTN (hypertension) 04/16/2018    Disposition: Discharge home to family, patient declined skilled nursing, home with home health   Discharge Condition: Stable, improved, back to baseline  Discharge Exam:  General: Alert, sitting upright in bed eating breakfast, no apparent distress Cardiovascular: Regular rate and rhythm, no murmurs appreciated Respiratory: Lungs clear to auscultation bilaterally, comfortable work of breathing Abdomen: Normal bowel sounds, soft, nontender to palpation, no distention appreciated Extremities: Moving all extremities appropriately, no deformity or edema  Brief Hospital Course:   Toxic Metabolic Encephalopathy, patient presented with altered mental status which improved markedly within 24 hours. This was felt to be due to Proteus bacteremia. CT head negative, showed age related changes. Ammonia negative. Evaluation for secondary causes negative.   Proteus bacteremia  Altered mental status resolved, remains afebrile. Blood cultures growing Proteus.  Urine culture negative, suspect abdominal source. Patient has liver  lesions and CT abdomen pelvis was notable for thickening at the gastroduodenal junction; known splenomegaly.  HIV negative. Sensitivities returned, appropriate for third generation cephalosporin. Completed 4 days of Ceftriaxone then transitioned for a total of 10 days of antibiotics with cefuroxime. Strongyloides IgG pending at time of discharge.   Urinary retention, resolved with Foley  BPH, stable  Patient with known BPH, now on Flomax (was prescribed Flomax outpatient but was not taking). Urology was consulted (very difficult placement due to BPH), Dr. Gloriann Loan placed Foley catheter. This is to be removed in 1 week at voiding trial-has Urology follow up this week. Continue Flomax. PSA previously negative.   Macrocytic anemia, improved Folate low at 5.8, vitamin B12 within normal limits.  MCV 102 on admission, hemoglobin stable at 11.1. Folate started.   Thrombocytopenia, improving Platelets 50 on admission now 61.  Likely secondary to patient cirrhotic liver disease, acute infection.Repeat as outpatient, likely due to cirrhosis.   Hx of Cirrhosis  Has been worked up outpatient.  Patient with history of varices.  Patient referred to GI however missed his  appointment.  Unremarkable hepatic vascular Doppler evaluation though intrahepatic portal veins velocities cannot be obtained.  Of note, patient did have 2 echogenic liver lesions consistent with hemangioma versus multifocal hepatocellular carcinoma. MRI showed that these lesions were likely fatty infiltration. Repeat MRI in 3-6 months.   HTN Started on Norvasc.   AKI, resolved Cr 1.5, resolved with fluids.    Issues for Follow Up:  1. Urinary retention: Patient had issues with urinary retention while in the hospital.  There was concern that the patient's Proteus bacteremia with potentially due to his urinary retention.  Urology was consulted and placed coud catheter to relieve distended bladder.  Patient is to follow-up with urology on  05/18/2020 at 10 AM for management/removal of urinary catheter.  The patient was discharged on  Flomax 0.4 mg daily. Please review appointment time and date with patient at follow up.  2. Proteus bacteremia: Patient's blood culture was growing Proteus vulgaris.  Patient was treated in the hospital with vancomycin, azithromycin, and cefepime, before being switched to IV ceftriaxone.  Sensitivities for the patient's Proteus vulgaris returned.  It was decided to send patient home on cefuroxime 600 mg 1 time daily for 6 days from 6/21-6/26. 3. Lung nodule: Patient was noted to have small calcified nodule seen at the right lung base on his CT abdomen/pelvis.  Consider outpatient CT chest to further evaluate. 4. Hypertension: Patient's blood pressure remained elevated during hospitalization.  Patient was discharged home on Norvasc 5 mg and instructed to take this daily.  Please follow-up patient's blood pressure at follow-up appointment. 5. Thrombocytopenia: Patient's platelets were as low as 37 during admission, were 61 at the time of discharge.  Please follow-up with [place] in the future.  Likely etiology might be due to patient's cirrhotic liver, acute infection.  The patient was kept on SCDs while hospitalized. 6. Macrocytic anemia: Patient was found to have macrocytic anemia with MCV as elevated as 102 during admission, hemoglobin around 11, folate level low at 5.8.  Patient was started on folate supplement 1 mg daily.  This was continued at the time of discharge. 7. Liver cirrhosis/echogenic liver lesions: Patient noted to have 2 echogenic liver lesions possibly consistent with hemangioma versus multifocal hepatocellular carcinoma on imaging in the hospital.  Patient also has a history of varices.  It is recommended the patient follows up with gastroenterology within 2-4 weeks of discharge (sometime between July 2-16). Repeat MRI in 3-6 months for liver lesions (two fatty lesions).   Significant Procedures:  MRI  abdomen Foley catheter placement   Significant Labs and Imaging:  Recent Labs  Lab 05/12/20 0353 05/13/20 0345 05/14/20 0455  WBC 7.3 5.7 5.0  HGB 11.7* 11.2* 11.1*  HCT 34.7* 32.6* 32.0*  PLT 41* 51* 61*   Recent Labs  Lab 05/10/20 2009 05/10/20 2009 05/11/20 0437 05/11/20 0437 05/12/20 0353 05/12/20 0353 05/13/20 0345 05/14/20 0455  NA 135  --  135  --  138  --  136 138  K 4.9   < > 4.3   < > 4.2   < > 3.6 3.7  CL 103  --  105  --  111  --  108 108  CO2 21*  --  22  --  23  --  22 25  GLUCOSE 174*  --  109*  --  80  --  95 101*  BUN 23  --  19  --  20  --  15 10  CREATININE 1.54*  --  1.23  --  1.03  --  0.83 0.47*  CALCIUM 8.9  --  8.4*  --  8.4*  --  8.2* 8.1*  ALKPHOS 141*  --  103  --  85  --   --   --   AST 47*  --  40  --  36  --   --   --   ALT 27  --  23  --  21  --   --   --   ALBUMIN 3.1*  --  2.6*  --  2.2*  --   --   --    < > = values in this interval not displayed.    Iron: 156 TIBC: Low at 207 Ferritin: 262 normal Hepatitis B surface antigen: Nonreactive ANA:  Negative Ceruloplasmin: 21.8, within normal limits Alpha-1 antitrypsin: 181 within normal limits Hepatitis C antibody: Nonreactive Vitamin B12: 724, within normal limits Folate: 5.8 low HIV antibody: Nonreactive HbA1c: 5.2% normal Ammonia: 35 normal Peripheral smear: Normocytic anemia, thrombocytopenia Urine culture: No growth at 4 days Blood culture: Proteus vulgaris   Results/Tests Pending at Time of Discharge:  Mitochondrial antibody Anti-smooth muscle antibody AFP tumor marker Strongyloides antibody   Discharge Medications:  Allergies as of 05/14/2020   No Known Allergies     Medication List    STOP taking these medications   traMADol 50 MG tablet Commonly known as: ULTRAM     TAKE these medications   amLODipine 5 MG tablet Commonly known as: NORVASC Take 1 tablet (5 mg total) by mouth daily. Start taking on: May 15, 2020   azelastine 0.05 % ophthalmic  solution Commonly known as: OPTIVAR Place 1 drop into both eyes 2 (two) times daily.   cefdinir 300 MG capsule Commonly known as: OMNICEF Take 2 capsules (600 mg total) by mouth daily for 6 days. Start taking on: May 15, 2020   famotidine 20 MG tablet Commonly known as: PEPCID Take 1 tablet (20 mg total) by mouth daily. Start taking on: May 15, 2020   fluticasone 50 MCG/ACT nasal spray Commonly known as: FLONASE Place 2 sprays into both nostrils daily.   folic acid 1 MG tablet Commonly known as: FOLVITE Take 1 tablet (1 mg total) by mouth daily. Start taking on: May 15, 2020   Olopatadine HCl 0.2 % Soln Apply 1 drop to eye daily.   tamsulosin 0.4 MG Caps capsule Commonly known as: FLOMAX Take 1 capsule (0.4 mg total) by mouth daily. Start taking on: May 15, 2020 What changed: when to take this       Discharge Instructions: Please refer to Patient Instructions section of EMR for full details.  Patient was counseled important signs and symptoms that should prompt return to medical care, changes in medications, dietary instructions, activity restrictions, and follow up appointments.   Follow-Up Appointments:  Follow-up Information    [name], MD. Go on 05/17/2020.   Specialty: Family Medicine Why: 11:00 Contact information: [place] [id number] [phone]        [facility]. Go on 05/18/2020.   Why: 10:00am Contact information: [address]             Lyndee Hensen, DO Milus Banister, DO  [name], MD  [facility] Medicine Teaching Service

## 2020-05-14 NOTE — Discharge Instructions (Signed)
Tulituma dawa ya Antibiotic kwa duka lako la dawa linaloitwa Cefuroxime. Tafadhali chukua vidonge 2 (600 mg) kila siku kwa siku 6 zijazo [place], [place] 21, [place] Juni 26.  Urology: Delbert Phenix wa Urology wa [facility]), [place] 24 ili Christoper Fabian catheter Gasper Lloyd kibofu chako. Endelea kuchukua Flomax kila siku kukusaidia kukojoa.  Tuna wasiwasi ini yako haina afya nzuri. Carlyon Prows Gastroenterology Daniels wiki 2-4, tutakupangia miadi na [place] na habari hiyo.  Tafadhali jibu simu zote, hata kama hazitokani na nambari unazotambua kwa sababu hii Blaine Hamper [facility] au Gastroenterology Kirtland Bouchard juu ya miadi!  Chukua kibao 1 cha Norvasc / Amlodipine 5mg  kila siku ili kupunguza shinikizo la damu.  Tafadhali fuata na Dr Angelia Mould mnamo Juni 23 na 11:00 [place].

## 2020-05-14 NOTE — Progress Notes (Signed)
Order received to discharge patient.  Telemetry monitor removed and CCMD notified.  PIV access removed.  Discharge instructions, follow up, medications and instructions for their use discussed with patient, wife and pt's son using Swahili interpreter.

## 2020-05-14 NOTE — Progress Notes (Addendum)
Progress Note:  Called Family of patient using help of Swahili Interpreter to obtain best phone number to contact him.    [phone] (cell phone) - Wife Galen Daft, did not answer (unbeknownst to me was in the patient's room at the time).  [phone] (landline) - answered, said this is a good number to call for follow up appointments. Daughter answered.  [phone] (cell phone for son Jennye Boroughs)  All three of these numbers are good ways to get in touch with this patient. Please use Swahili Interpreter to communicate with this patient and his family members. (Interpreter [phone].)   Milus Banister, [place], PGY-2 05/14/2020 3:17 PM

## 2020-05-15 ENCOUNTER — Telehealth: Payer: Self-pay

## 2020-05-15 ENCOUNTER — Ambulatory Visit: Payer: Self-pay

## 2020-05-15 ENCOUNTER — Other Ambulatory Visit: Payer: Self-pay | Admitting: Family Medicine

## 2020-05-15 DIAGNOSIS — K7469 Other cirrhosis of liver: Secondary | ICD-10-CM

## 2020-05-15 LAB — AFP TUMOR MARKER: AFP, Serum, Tumor Marker: 560 ng/mL — ABNORMAL HIGH (ref 0.0–8.3)

## 2020-05-15 LAB — ANTI-SMOOTH MUSCLE ANTIBODY, IGG: F-Actin IgG: 20 Units — ABNORMAL HIGH (ref 0–19)

## 2020-05-15 LAB — MITOCHONDRIAL ANTIBODIES: Mitochondrial M2 Ab, IgG: 22.3 Units — ABNORMAL HIGH (ref 0.0–20.0)

## 2020-05-15 NOTE — Progress Notes (Deleted)
    SUBJECTIVE:   CHIEF COMPLAINT / HPI:   Hospital f/u Admitted from 6/17 to 6/21 for AMS 2/2 toxic metabolic encephalopathy from proteus bacteremia.   Toxic Metabolic Encephalopathy Secondary to Proteus bacteremia. CT head at hospital negative, showed age related changes. Ammonia negative. Evaluation for secondary causes negative.  ***  Proteus bacteremia  Patient with positive blood cultures growing Proteus, suspected as abdominal source.Patient has liver lesions and CT abdomen pelvis was notable for thickening at the gastroduodenal junction; known splenomegaly. Patient was treated in the hospital with vancomycin, azithromycin, and cefepime, before being switched to IV ceftriaxone. Patient completed 4 days of Ceftriaxone and was then transitioned for a total of 10 days of antibiotics with cefuroxime, 6/21-6/26.. Strongyloides IgG pending at time of discharge.***   Urinary retention, resolved with Foley BPH, stable Proteus bacteremia felt to be 2/2 retention. Urology was consulted for foley, placed by Dr. Gloriann Loan in hospital. This is to be removed in 1 week at voiding trial-has Urology follow up this week. Continue Flomax.  F/u appointment schedule on 05/18/2020 at 10 AM for management/removal of urinary catheter.  The patient was discharged on Flomax 0.4 mg daily. ***Please review appointment time and date with patient at follow up.   ***Macrocytic anemia, improved Folate low at 5.8, vitamin B12 within normal limits. MCV 102 on admission, hemoglobin stable at 11.1. Folate started.   Patient was found to have macrocytic anemia with MCV as elevated as 102 during admission, hemoglobin around 11, folate level low at 5.8.  Patient was started on folate supplement 1 mg daily.  This was continued at the time of discharge.  Thrombocytopenia, improving Platelets50on admission now 61. Likely secondary to patient cirrhotic liver disease, acute infection.Repeat as outpatient, likely due to  cirrhosis.   Patient's platelets were as low as 37 during admission, were 61 at the time of discharge.  Please follow-up with [place] in the future.  Likely etiology might be due to patient's cirrhotic liver, acute infection.  The patient was kept on SCDs while hospitalized.  Hx of Cirrhosis  Has been worked up outpatient. Patient with history of varices. Patient referred to GI however missed his appointment. Unremarkable hepatic vascular Doppler evaluation though intrahepatic portal veins velocities cannot be obtained. Of note, patient did have 2 echogenic liver lesions consistent with hemangioma versus multifocal hepatocellular carcinoma. MRI showed that these lesions were likely fatty infiltration. Repeat MRI in 3-6 months.   Patient noted to have 2 echogenic liver lesions possibly consistent with hemangioma versus multifocal hepatocellular carcinoma on imaging in the hospital.  Patient also has a history of varices.  It is recommended the patient follows up with gastroenterology within 2-4 weeks of discharge (sometime between July 2-16). Repeat MRI in 3-6 months for liver lesions (two fatty lesions).   HTN  Started on Norvasc. ***  Patient's blood pressure remained elevated during hospitalization.  Patient was discharged home on Norvasc 5 mg and instructed to take this daily. Please follow-up patient's blood pressure at follow-up appointment.  Lung nodule Patient was noted to have small calcified nodule seen at the right lung base on his CT abdomen/pelvis.  Consider outpatient CT chest to further evaluate.   PERTINENT  PMH / PSH: ***  OBJECTIVE:   There were no vitals taken for this visit.  ***  ASSESSMENT/PLAN:   No problem-specific Assessment & Plan notes found for this encounter.     Caroline More, [place]

## 2020-05-15 NOTE — Chronic Care Management (AMB) (Signed)
Care Management   Follow Up Note   05/15/2020 Name: Jim Little MRN: [id number] DOB: [birth year]  Referred by: Jim Malay, MD Reason for referral : Care Coordination (Care Management RNCM Appointments)   Jim Little is a 74 y.o. year old male who is a primary care patient of Jim Malay, MD. The care management team was consulted for assistance with care management and care coordination needs.    Review of patient status, including review of consultants reports, relevant laboratory and other test results, and collaboration with appropriate care team members and the patient's provider was performed as part of comprehensive patient evaluation and provision of chronic care management services.    SDOH (Social Determinants of Health) assessments performed: No See Care Plan activities for detailed interventions related to [facility])     Advanced Directives: See Care Plan and Vynca application for related entries.   Goals Addressed              This Visit's Progress   .  I need help with transportation (pt-stated)        CARE PLAN ENTRY (see longitudinal plan of care for additional care plan information)  Current Barriers:  . 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  Interventions:  . Evaluation of current treatment plan related to enlarged prostate and patient's adherence to plan as established by provider. . Advised patient to set up his voice mail with family so he will not miss messages using (interpreter) [name].  Patient verbalized understanding. . Discussed plans with patient for ongoing care management follow up and provided patient with direct contact information for care management team . Reviewed scheduled/upcoming  provider appointments including: June 24th at 1030 with [facility] appointment made with patient on the phone with interpreter . [facility] will send the patient a packet a reminder card and a map. . Patient states that he did attend his appointment to GI on 04/04/20. . Care Guide referral sent for transportation . 05/15/20 . Called the patient at his home and spoke with his daughter present using interpreter Jim Little to remind the patient about his appointments.  The daughter ,Jim Little and his wife was present on the line.  Tried to give the patient information for his appointments but they seemed to be confused.  I told them about Jim Blizzard RN  [phone] Congregational Nurse who speaks Swahili who is willing to work with the patient and help them navigate the medical system.  They gave me permission to give her their information and I also gave them her number.  I told them that I would have her to call them and give them the appointment information again. . -Urology: Patient is supposed to see urology on June 24 @ 1030  (he will an interpreter) to have his urinary catheter removed.  [facility], office located at [place], [place], [place] [address]; Phone: [phone] .  . -Gastroenterology:  .  He has a follow up appointment with [facility] on 07/05/2020 at 1:15 pm with Dr. Therisa Little. (He has been set up with transportation but will need to call a week in advance from what I was told) [facility] is located at [address], [place], [name] [address] phone [phone].  Marland Kitchen  the daughter has the number for the transportation. [name] has my information if she has any questions or concerns.  [name] can be contacted through staff messages in epic.  Patient Self Care Activities:  . Patient verbalizes understanding of plan  . Attends all scheduled provider appointments . Unable to independently self manage scheduling appointments and transportation  Please see  past updates related to this goal by clicking on the "Past Updates" button in the selected goal          RNCM will follow up with the Jim Blizzard RN to see how the patient is progressing in the month of July  Jim Eickhoff RN, BSN, [facility] Management Coordinator [place] Phone: [phone] Fax: [phone]

## 2020-05-15 NOTE — Patient Instructions (Signed)
Visit Information  Goals Addressed              This Visit's Progress   .  I need help with transportation (pt-stated)        CARE PLAN ENTRY (see longitudinal plan of care for additional care plan information)  Current Barriers:  . 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  Interventions:  . Evaluation of current treatment plan related to enlarged prostate and patient's adherence to plan as established by provider. . Advised patient to set up his voice mail with family so he will not miss messages using (interpreter) [place].  Patient verbalized understanding. . Discussed plans with patient for ongoing care management follow up and provided patient with direct contact information for care management team . Reviewed scheduled/upcoming provider appointments including: June 24th at 1030 with [facility] appointment made with patient on the phone with interpreter . [facility] will send the patient a packet a reminder card and a map. . Patient states that he did attend his appointment to GI on 04/04/20. . Care Guide referral sent for transportation . 05/15/20 . Called the patient at his home and spoke with his daughter present using interpreter [name]# [id number] to remind the patient about his appointments.  The daughter ,Mr Herrman and his wife was present on the line.  Tried to give the patient information for his appointments but they seemed to be confused.  I told them about [name] RN  [phone] Congregational Nurse who speaks Swahili who is willing to work with the patient and help them navigate the medical system.  They gave me permission to give her their information and I also gave them her number.  I told them that I would  have her to call them and give them the appointment information again. . -Urology: Patient is supposed to see urology on June 24 @ 1030  (he will an interpreter) to have his urinary catheter removed.  [facility], office located at [place], [place], [place] [address]; Phone: [phone] .  . -Gastroenterology:  .  He has a follow up appointment with [facility] on 07/05/2020 at 1:15 pm with Dr. Therisa Doyne. (He has been set up with transportation but will need to call a week in advance from what I was told) [facility] is located at [address], [place], [place] [address] phone [phone].  .  the daughter has the number for the transportation. [name] has my information if she has any questions or concerns.  [name] can be contacted through staff messages in epic.  Patient Self Care Activities:  . Patient verbalizes understanding of plan  . Attends all scheduled provider appointments . Unable to independently self manage scheduling appointments and transportation  Please see past updates related to this goal by clicking on the "Past Updates" button in the selected goal         Mr. Pitstick was given information about Care Management services today including:  1. Care Management services include personalized support from designated clinical staff supervised by his physician, including individualized plan of care and coordination with other care providers 2. 24/7 contact phone numbers for assistance for urgent and routine care needs. 3. The patient may stop CCM services at any  time (effective at the end of the month) by phone call to the office staff.  Patient agreed to services and verbal consent obtained.   The patient verbalized understanding of instructions provided today and declined a print copy of patient instruction materials.   RNCM will follow up with the [name] RN to see how the patient is progressing in the month of July  Caasi Giglia RN, BSN,  [facility] Care Management Coordinator [place] Phone: [phone] Fax: [phone]

## 2020-05-15 NOTE — Chronic Care Management (AMB) (Signed)
  Care Management   Outreach Note  05/15/2020 Name: Jim Little MRN: [id number] DOB: [birth year]  Referred by: [name], MD Reason for referral : Care Coordination (Care Management RNCM appointments)   An unsuccessful telephone outreach was attempted today. The patient was referred to the case management team for assistance with care management and care coordination.   Follow Up Plan: A HIPPA compliant phone message was left for the patient providing contact information and requesting a return call.  Daughter stated that he left to go pick up one of his grand children and would be back in about 10 minutes. I told her using the interpreter that I would call back in about 20 to 30 minutes and she stated that would be fine.  Interpreter Isha [place], BSN, [facility] Care Management Coordinator [place] Phone: [phone] Fax: [phone]

## 2020-05-15 NOTE — Telephone Encounter (Signed)
I reached out to Mr Jim Little in attempt to establish care with congregational nurse program. No response. I will attempt again tomorrow. Honor Loh Rn BSn PCCN [phone]

## 2020-05-16 ENCOUNTER — Telehealth: Payer: Self-pay

## 2020-05-16 DIAGNOSIS — Z0000 Encounter for general adult medical examination without abnormal findings: Secondary | ICD-10-CM

## 2020-05-16 LAB — STRONGYLOIDES, AB, IGG: Strongyloides, Ab, IgG: NEGATIVE

## 2020-05-16 NOTE — Telephone Encounter (Signed)
I have called My [name] patient`s Son and explained the upcoming appointments with urology and gastro-entologist..I explained this in swahili and he verbalized understanding. I will call and remind him the day before appointment. [name] Lita Flynn RN BSN PCCN [phone]

## 2020-05-17 ENCOUNTER — Other Ambulatory Visit: Payer: Self-pay | Admitting: Gastroenterology

## 2020-05-17 ENCOUNTER — Telehealth: Payer: Self-pay | Admitting: Family Medicine

## 2020-05-17 ENCOUNTER — Ambulatory Visit: Payer: Medicaid Other

## 2020-05-17 NOTE — Telephone Encounter (Signed)
Patient with no show to appointment today for hospital follow up. Called and spoke to [employer] who is currently working with patient as Network engineer. [name] will call patient and have them re-schedule hospital follow up appointment.   [place] desk, when scheduling please allow extra time as Dr. Owens Shark has requested during initial hospital follow up. Should be 2 patient appointment slots  Caroline More, DO, PGY-3 [facility] 05/17/2020 11:54 AM

## 2020-05-17 NOTE — Progress Notes (Signed)
  This encounter was created in error - please disregard.  This encounter was created in error - please disregard.  This encounter was created in error - please disregard.  This encounter was created in error - please disregard. 

## 2020-05-18 ENCOUNTER — Telehealth: Payer: Self-pay

## 2020-05-18 NOTE — Telephone Encounter (Signed)
Called patient son Mr Jim Little to remind him that his father Mr Jim Little  has an appointment today with urology. He did not pick up my call  but I did text him and he responded back. [name] Jeydi Klingel RN BSN PCCn [phone]

## 2020-05-30 ENCOUNTER — Telehealth: Payer: Self-pay

## 2020-05-30 ENCOUNTER — Other Ambulatory Visit: Payer: Self-pay

## 2020-05-30 NOTE — Telephone Encounter (Signed)
I have called [facility] and made appointment

## 2020-05-30 NOTE — Telephone Encounter (Signed)
I have called [facility] and scheduled Jim Little to be seen on July 23 rd at 0910 am.I have communicated this to his son [name] who verbalized understanding  Honor Loh RN BSn PCCn [phone]

## 2020-06-01 ENCOUNTER — Other Ambulatory Visit: Payer: Self-pay

## 2020-06-01 ENCOUNTER — Encounter (HOSPITAL_COMMUNITY): Payer: Self-pay

## 2020-06-01 ENCOUNTER — Other Ambulatory Visit: Payer: Self-pay | Admitting: Physician Assistant

## 2020-06-01 ENCOUNTER — Emergency Department (HOSPITAL_COMMUNITY)
Admission: EM | Admit: 2020-06-01 | Discharge: 2020-06-01 | Disposition: A | Discharge status: Home / Self Care | Payer: Medicaid Other | Attending: Emergency Medicine | Admitting: Emergency Medicine

## 2020-06-01 ENCOUNTER — Emergency Department (HOSPITAL_COMMUNITY): Payer: Medicaid Other

## 2020-06-01 DIAGNOSIS — R5383 Other fatigue: Secondary | ICD-10-CM | POA: Diagnosis present

## 2020-06-01 DIAGNOSIS — T83511A Infection and inflammatory reaction due to indwelling urethral catheter, initial encounter: Secondary | ICD-10-CM | POA: Diagnosis not present

## 2020-06-01 DIAGNOSIS — I10 Essential (primary) hypertension: Secondary | ICD-10-CM

## 2020-06-01 DIAGNOSIS — U071 COVID-19: Secondary | ICD-10-CM | POA: Diagnosis not present

## 2020-06-01 DIAGNOSIS — Y732 Prosthetic and other implants, materials and accessory gastroenterology and urology devices associated with adverse incidents: Secondary | ICD-10-CM | POA: Diagnosis not present

## 2020-06-01 DIAGNOSIS — Z79899 Other long term (current) drug therapy: Secondary | ICD-10-CM | POA: Insufficient documentation

## 2020-06-01 DIAGNOSIS — N401 Enlarged prostate with lower urinary tract symptoms: Secondary | ICD-10-CM | POA: Diagnosis not present

## 2020-06-01 DIAGNOSIS — N390 Urinary tract infection, site not specified: Secondary | ICD-10-CM | POA: Diagnosis not present

## 2020-06-01 LAB — CBC WITH DIFFERENTIAL/PLATELET
Abs Immature Granulocytes: 0.01 10*3/uL (ref 0.00–0.07)
Basophils Absolute: 0 10*3/uL (ref 0.0–0.1)
Basophils Relative: 0 %
Eosinophils Absolute: 0 10*3/uL (ref 0.0–0.5)
Eosinophils Relative: 1 %
HCT: 38.7 % — ABNORMAL LOW (ref 39.0–52.0)
Hemoglobin: 12.9 g/dL — ABNORMAL LOW (ref 13.0–17.0)
Immature Granulocytes: 0 %
Lymphocytes Relative: 20 %
Lymphs Abs: 0.9 10*3/uL (ref 0.7–4.0)
MCH: 34.1 pg — ABNORMAL HIGH (ref 26.0–34.0)
MCHC: 33.3 g/dL (ref 30.0–36.0)
MCV: 102.4 fL — ABNORMAL HIGH (ref 80.0–100.0)
Monocytes Absolute: 1.2 10*3/uL — ABNORMAL HIGH (ref 0.1–1.0)
Monocytes Relative: 26 %
Neutro Abs: 2.5 10*3/uL (ref 1.7–7.7)
Neutrophils Relative %: 53 %
Platelets: 92 10*3/uL — ABNORMAL LOW (ref 150–400)
RBC: 3.78 MIL/uL — ABNORMAL LOW (ref 4.22–5.81)
RDW: 15.1 % (ref 11.5–15.5)
WBC: 4.7 10*3/uL (ref 4.0–10.5)
nRBC: 0 % (ref 0.0–0.2)

## 2020-06-01 LAB — URINALYSIS, ROUTINE W REFLEX MICROSCOPIC
Bilirubin Urine: NEGATIVE
Glucose, UA: NEGATIVE mg/dL
Ketones, ur: NEGATIVE mg/dL
Nitrite: POSITIVE — AB
Protein, ur: >=300 mg/dL — AB
Specific Gravity, Urine: 1.018 (ref 1.005–1.030)
WBC, UA: >50 WBC/hpf — ABNORMAL HIGH (ref 0–5)
pH: 7 (ref 5.0–8.0)

## 2020-06-01 LAB — COMPREHENSIVE METABOLIC PANEL
ALT: 50 U/L — ABNORMAL HIGH (ref 0–44)
AST: 81 U/L — ABNORMAL HIGH (ref 15–41)
Albumin: 3.3 g/dL — ABNORMAL LOW (ref 3.5–5.0)
Alkaline Phosphatase: 139 U/L — ABNORMAL HIGH (ref 38–126)
Anion gap: 6 (ref 5–15)
BUN: 6 mg/dL — ABNORMAL LOW (ref 8–23)
CO2: 26 mmol/L (ref 22–32)
Calcium: 8.6 mg/dL — ABNORMAL LOW (ref 8.9–10.3)
Chloride: 104 mmol/L (ref 98–111)
Creatinine, Ser: 1.16 mg/dL (ref 0.61–1.24)
GFR calc Af Amer: >60 mL/min (ref >60)
GFR calc non Af Amer: >60 mL/min (ref >60)
Glucose, Bld: 97 mg/dL (ref 70–99)
Potassium: 4.6 mmol/L (ref 3.5–5.1)
Sodium: 136 mmol/L (ref 135–145)
Total Bilirubin: 0.9 mg/dL (ref 0.3–1.2)
Total Protein: 7.9 g/dL (ref 6.5–8.1)

## 2020-06-01 LAB — LACTIC ACID, PLASMA
Lactic Acid, Venous: 1.5 mmol/L (ref 0.5–1.9)
Lactic Acid, Venous: 1.1 mmol/L (ref 0.5–1.9)

## 2020-06-01 LAB — SARS CORONAVIRUS 2 BY RT PCR (HOSPITAL ORDER, PERFORMED IN ~~LOC~~ HOSPITAL LAB): SARS Coronavirus 2: POSITIVE — AB

## 2020-06-01 LAB — AMMONIA: Ammonia: 26 umol/L (ref 9–35)

## 2020-06-01 MED ORDER — ACETAMINOPHEN 500 MG PO TABS
1000.0000 mg | ORAL_TABLET | Freq: Once | ORAL | Status: AC
  Administered 2020-06-01: 1000 mg via ORAL
  Filled 2020-06-01: qty 2

## 2020-06-01 MED ORDER — SODIUM CHLORIDE 0.9 % IV SOLN
1.0000 g | Freq: Once | INTRAVENOUS | Status: AC
  Administered 2020-06-01: 1 g via INTRAVENOUS
  Filled 2020-06-01: qty 10

## 2020-06-01 MED ORDER — IBUPROFEN 400 MG PO TABS
600.0000 mg | ORAL_TABLET | Freq: Once | ORAL | Status: AC
  Administered 2020-06-01: 600 mg via ORAL
  Filled 2020-06-01: qty 1

## 2020-06-01 MED ORDER — CEPHALEXIN 500 MG PO CAPS
500.0000 mg | ORAL_CAPSULE | Freq: Three times a day (TID) | ORAL | 0 refills | Status: DC
Start: 2020-06-01 — End: 2020-06-02

## 2020-06-01 NOTE — Progress Notes (Signed)
I connected by phone with Jim Little on 06/01/2020 at 5:33 PM to discuss the potential use of a new treatment for mild to moderate COVID-19 viral infection in non-hospitalized patients.  This patient is a 74 y.o. male that meets the FDA criteria for Emergency Use Authorization of COVID monoclonal antibody casirivimab/imdevimab.  Has a (+) direct SARS-CoV-2 viral test result  Has mild or moderate COVID-19   Is NOT hospitalized due to COVID-19  Is within 10 days of symptom onset  Has at least one of the high risk factor(s) for progression to severe COVID-19 and/or hospitalization as defined in EUA.  Specific high risk criteria : Older age (>/= 76 yo) & HTN   I have spoken and communicated the following to the patient or parent/caregiver regarding COVID monoclonal antibody treatment:  1. FDA has authorized the emergency use for the treatment of mild to moderate COVID-19 in adults and pediatric patients with positive results of direct SARS-CoV-2 viral testing who are 53 years of age and older weighing at least 40 kg, and who are at high risk for progressing to severe COVID-19 and/or hospitalization.  2. The significant known and potential risks and benefits of COVID monoclonal antibody, and the extent to which such potential risks and benefits are unknown.  3. Information on available alternative treatments and the risks and benefits of those alternatives, including clinical trials.  4. Patients treated with COVID monoclonal antibody should continue to self-isolate and use infection control measures (e.g., wear mask, isolate, social distance, avoid sharing personal items, clean and disinfect "high touch" surfaces, and frequent handwashing) according to CDC guidelines.   5. The patient or parent/caregiver has the option to accept or refuse COVID monoclonal antibody treatment.  After reviewing this information with the patient, The patient agreed to proceed with receiving casirivimab\imdevimab  infusion and will be provided a copy of the Fact sheet prior to receiving the infusion.   Sx onset 7/7. Set up for monoclonal antibody infusion on 7/10 @ 8:30am. We will have transportation arranged for him. I spoke to him with a Swahili interpreter.   Angelena Form 06/01/2020 5:33 PM

## 2020-06-01 NOTE — Discharge Instructions (Addendum)
The [facility] Infusion center will call the patient for remdesivir or monoclonal treatment because he is COVID+  DOCTOR'S APPOINTMENT   Future Appointments  Date Time Provider [place]  06/02/2020 10:10 AM Wilber Oliphant, MD [facility]  06/06/2020 11:00 AM [facility]-CCM-CASE MANAGER [facility] [place]  06/07/2020  9:00 AM PCC-PROVIDER [facility]-PCC None  06/16/2020  9:10 AM [name], MD [facility] [place]     Take care and be well!  [facility]  [place], [place] [address] [phone]  Hello Jim Little,   You have been scheduled to receive Regeneron (the monoclonal antibody we discussed) on : 06/03/20 @ 8:30am. Transportation will be arranged.     The address for the infusion clinic site is:  --[place] - there is a [employer] manager marking the entrance to parking.     --Park in one of the marked spaces and call the front desk for assistance inside [phone]   --Average time in department is roughly 3 hours for Regeneron treatment - this includes preparation of the medication, IV start and the required 1 hour monitoring after the infusion.    Should you develop worsening shortness of breath, chest pain or severe breathing problems please do not wait for this appointment and go to the Emergency room for evaluation and treatment.   The day of your visit you should: Marland Kitchen Get plenty of rest the night before and drink plenty of water . Eat a light meal/snack before coming and take your medications as prescribed  . Wear warm, comfortable clothes with a shirt that can roll-up over the elbow (will need IV start).  . Wear a mask  . Consider bringing some activity to help pass the time . You will have to wait at least 90 days after this infusion to get the Covid 19 vaccinaiton   I hope this helps find you feeling better,  Nell Range PA-C

## 2020-06-01 NOTE — Progress Notes (Signed)
I called into the room with a Swahili interpreter. He has been set up for outpatient monoclonal antibody infusion on 7/10 @ 8:30 am. Transportation will be arranged. Please see orders with consent form for more details. I put information in his DC instructions and follow up.    Angelena Form PA-C  [facility]

## 2020-06-01 NOTE — ED Notes (Signed)
Pt states at this time he is unable to provide urine

## 2020-06-01 NOTE — ED Triage Notes (Signed)
Pt BIB EMS from home with poor explanation as to why per EMS pt is here due to possible UTI and needing to get foley taken out. Pt has cathter in place x2 weeks. Pt is unsure where it was placed. Pt speaks swahili. Pt family called EMS but there was also a language barrier. Pt is febrile on arrival with 103.28F. other VSS

## 2020-06-01 NOTE — ED Provider Notes (Signed)
[facility] EMERGENCY DEPARTMENT Provider Note   CSN: [id number] Arrival date & time: 06/01/20  1052     History No chief complaint on file.   Jim Little is a 74 y.o. male.  HPI  74yM with fatigue and body aches. Language barrier. Speaks Swahili and interpretor service used. Even with interpretor I found it difficult to obtain a good history. Began feeling poorly last night. Body aches. Doesn't have an appetite. No v/d. Shoulder and upper back are sore. Denies pain elsewhere. Still has foley from recent hospitalization.   Past Medical History:  Diagnosis Date  . BPH (benign prostatic hyperplasia)   . Cirrhosis ([place])   . Splenomegaly     Patient Active Problem List   Diagnosis Date Noted  . Sepsis ([place]) 05/11/2020  . Urinary retention 05/11/2020  . Thrombocytopenia ([place]) 05/11/2020  . Bilateral cataracts 07/15/2018  . Refugee health examination 04/16/2018  . Splenomegaly 04/16/2018  . Chronic back pain 04/16/2018  . 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 Lower urinary tract symptoms (LUTS) 04/16/2018  . HTN (hypertension) 04/16/2018   Past Surgical History:  Procedure Laterality Date  . UPPER GASTROINTESTINAL ENDOSCOPY  03/2018   [place] overseas [place] --candidiasis plus esophageal varices grade 2.  Treated with fluconazole and status post esophageal banding.  [place] Kitchen US ECHOCARDIOGRAPHY  10/2016   per overseas records from [place] - LVEF 68%; mildly calcified aortic valve, normal function otherwise     History reviewed. No pertinent family history.  Social History   Tobacco Use  . Smoking status: Never Smoker  . Smokeless tobacco: Never Used  Substance Use Topics  . Alcohol use: Never  . Drug use: Not on file   Home Medications Prior to Admission medications   Medication Sig Start Date End Date Taking? Authorizing Provider  amLODipine (NORVASC) 5 MG tablet Take 1 tablet (5 mg total) by mouth daily. 05/15/20   [name], DO  azelastine  (OPTIVAR) 0.05 % ophthalmic solution Place 1 drop into both eyes 2 (two) times daily. 01/31/20   [name], MD  famotidine (PEPCID) 20 MG tablet Take 1 tablet (20 mg total) by mouth daily. 05/15/20   [name], DO  fluticasone (FLONASE) 50 MCG/ACT nasal spray Place 2 sprays into both nostrils daily. 03/06/20   [name], MD  folic acid (FOLVITE) 1 MG tablet Take 1 tablet (1 mg total) by mouth daily. 05/15/20   [name], DO  Olopatadine HCl 0.2 % SOLN Apply 1 drop to eye daily. 03/06/20   [name], MD  tamsulosin (FLOMAX) 0.4 MG CAPS capsule Take 1 capsule (0.4 mg total) by mouth daily. 05/15/20   [name], DO    Allergies    Patient has no known allergies.  Review of Systems   Review of Systems All systems reviewed and negative, other than as noted in HPI.  Physical Exam Updated Vital Signs BP (!) 178/93 (BP Location: Right Arm)   Pulse 80   Temp (!) 103.2 F (39.6 C) (Oral)   Resp 18   SpO2 97%   Physical Exam Vitals and nursing note reviewed.  Constitutional:      General: He is not in acute distress.    Appearance: He is well-developed.     Comments: Sitting in bed. NAD.   HENT:     Head: Normocephalic and atraumatic.  Eyes:     General:        Right eye: No discharge.  Left eye: No discharge.     Conjunctiva/sclera: Conjunctivae normal.  Cardiovascular:     Rate and Rhythm: Normal rate and regular rhythm.     Heart sounds: Normal heart sounds. No murmur heard.  No friction rub. No gallop.   Pulmonary:     Effort: Pulmonary effort is normal. No respiratory distress.     Breath sounds: Normal breath sounds.  Abdominal:     General: There is no distension.     Palpations: Abdomen is soft.     Tenderness: There is no abdominal tenderness.     Comments: Suprapubic tenderness  Genitourinary:    Comments: foley Musculoskeletal:        General: No tenderness.     Cervical back: Neck supple.  Skin:    General: Skin is  warm and dry.  Neurological:     Mental Status: He is alert.  Psychiatric:        Behavior: Behavior normal.        Thought Content: Thought content normal.    ED Results / Procedures / Treatments   Labs (all labs ordered are listed, but only abnormal results are displayed) Labs Reviewed  SARS CORONAVIRUS 2 BY RT PCR ([place]) - Abnormal; Notable for the following components:      Result Value   SARS Coronavirus 2 POSITIVE (*)    All other components within normal limits  URINALYSIS, ROUTINE W REFLEX MICROSCOPIC - Abnormal; Notable for the following components:   Color, Urine AMBER (*)    APPearance CLOUDY (*)    Hgb urine dipstick LARGE (*)    Protein, ur >=300 (*)    Nitrite POSITIVE (*)    Leukocytes,Ua MODERATE (*)    WBC, UA >50 (*)    Bacteria, UA MANY (*)    All other components within normal limits  COMPREHENSIVE METABOLIC PANEL - Abnormal; Notable for the following components:   BUN 6 (*)    Calcium 8.6 (*)    Albumin 3.3 (*)    AST 81 (*)    ALT 50 (*)    Alkaline Phosphatase 139 (*)    All other components within normal limits  CBC WITH DIFFERENTIAL/PLATELET - Abnormal; Notable for the following components:   RBC 3.78 (*)    Hemoglobin 12.9 (*)    HCT 38.7 (*)    MCV 102.4 (*)    MCH 34.1 (*)    Platelets 92 (*)    Monocytes Absolute 1.2 (*)    All other components within normal limits  CULTURE, BLOOD (ROUTINE X 2)  CULTURE, BLOOD (ROUTINE X 2)  URINE CULTURE  LACTIC ACID, PLASMA  LACTIC ACID, PLASMA  AMMONIA   EKG None  Radiology DG Chest Portable 1 View  Result Date: 06/01/2020 CLINICAL DATA:  Fever EXAM: PORTABLE CHEST 1 VIEW COMPARISON:  May 10, 2020 FINDINGS: There is slight left base atelectasis. There is a small granuloma in the right base. Lungs elsewhere clear. Heart is upper normal in size with pulmonary vascularity normal. No adenopathy. No bone lesions. IMPRESSION: Mild left base atelectasis.  Small granuloma right base. Lungs elsewhere clear. Heart upper normal in size. Electronically Signed   By: Lowella Grip III M.D.   On: 06/01/2020 12:12   Procedures Procedures (including critical care time)  Medications Ordered in ED Medications  acetaminophen (TYLENOL) tablet 1,000 mg (1,000 mg Oral Given 06/01/20 1157)  cefTRIAXone (ROCEPHIN) 1 g in sodium chloride 0.9 % 100 mL IVPB (1  g Intravenous New Bag/Given 06/01/20 1445)  ibuprofen (ADVIL) tablet 600 mg (600 mg Oral Given 06/01/20 1445)   ED Course  I have reviewed the triage vital signs and the nursing notes.  Pertinent labs & imaging results that were available during my care of the patient were reviewed by me and considered in my medical decision making (see chart for details).    MDM Rules/Calculators/A&P                          74 year old male with febrile illness.  There is a language barrier.  Primarily Swahili speaking.  Even with translator I felt there was some communication difficulty.  I am not sure if he is confused versus language and cultural differences.  He told he came to the emergency room because he had an appointment and that the police brought him when actually it was EMS.  Cannot get collateral information from family as they apparently also speak Swahili. He was admitted just a few weeks ago with Proteus bacteremia.  Multiple notes since discharge mentioning lack of follow-up with both family practice and urology.  He has a UTI as well as Covid positive today.  He does not appear toxic.  Chest x-ray with no focal infiltrate.  He is not hypoxic. With better circumstances I think he would fine for outpatient follow-up. This has seemed to be a problem recently though.  Discussed with Family Medicine. If patient goes home, they will arrange for follow-up. If he is discharged he will have to show [place] he can void spontaneously. If not, will need new catheter placed and follow-up with urology as previously planned.    Jim Little was evaluated in Emergency Department on 06/01/2020 for the symptoms described in the history of present illness. He was evaluated in the context of the global COVID-19 pandemic, which necessitated consideration that the patient might be at risk for infection with the SARS-CoV-2 virus that causes COVID-19. Institutional protocols and algorithms that pertain to the evaluation of patients at risk for COVID-19 are in a state of rapid change based on information released by regulatory bodies including the CDC and federal and state organizations. These policies and algorithms were followed during the patient's care in the ED.   Final Clinical Impression(s) / ED Diagnoses Final diagnoses:  COVID-19 virus infection  Urinary tract infection associated with indwelling urethral catheter, initial encounter [facility])    Rx / [place] Orders ED Discharge Orders    None       Virgel Manifold, MD 06/02/20 [phone]

## 2020-06-01 NOTE — ED Notes (Signed)
PTAR at bedside for discharge 

## 2020-06-01 NOTE — ED Notes (Signed)
Pt aware of pending transport home, pt sitting comfortable in bed at this time, denying any needs wants or concerns.

## 2020-06-02 ENCOUNTER — Telehealth (INDEPENDENT_AMBULATORY_CARE_PROVIDER_SITE_OTHER): Payer: Medicaid Other | Admitting: Family Medicine

## 2020-06-02 ENCOUNTER — Encounter: Payer: Self-pay | Admitting: Family Medicine

## 2020-06-02 DIAGNOSIS — R5081 Fever presenting with conditions classified elsewhere: Secondary | ICD-10-CM

## 2020-06-02 LAB — URINE CULTURE

## 2020-06-02 MED ORDER — CEPHALEXIN 500 MG PO CAPS
500.0000 mg | ORAL_CAPSULE | Freq: Four times a day (QID) | ORAL | 0 refills | Status: DC
Start: 2020-06-02 — End: 2020-06-02

## 2020-06-02 MED ORDER — CEPHALEXIN 500 MG PO CAPS
500.0000 mg | ORAL_CAPSULE | Freq: Four times a day (QID) | ORAL | 0 refills | Status: AC
Start: 2020-06-02 — End: 2020-06-09

## 2020-06-02 MED ORDER — SODIUM CHLORIDE 0.9 % IV SOLN
Freq: Once | INTRAVENOUS | Status: AC
  Filled 2020-06-02: qty 600

## 2020-06-02 NOTE — Progress Notes (Signed)
[facility] Telemedicine Visit  Patient consented to have virtual visit and was identified by name and date of birth. Method of visit: Telephone  Encounter participants: Patient: Jim Little - located at Home  Provider: Wilber Oliphant - located at [facility]  Others (if applicable): interpretor, Swahili   Chief Complaint: F/u ED   HPI:  Was seen in the ED on 06/01/20 for fatigue and body aches. He spent the whole day in the hospital and was discharged home at night. Patient reports fevers but not sure about number as he does not have a thermometer. He is not having any coughing, difficulty breathing, SOB.  Nausea vomiting, decreased appetite.  Started feeling poorly on the evening of 05/31/2020. He also believes that his upper back and and shoulders are sore. Feels like this is not from [place] and more so from a urinary infection.  Patient with hx of urinary retention and had a foley in place until it was removed yesterday in the ED. It was continued from recent hospital stay for proteus bacteremia (6/16-6/20).  Had a successful voiding trial prior to being discharged from the hospital yesterday.  He does report that he has been able to urinate since being home. No dysuria.  He does report that his pain is still cloudy. In the ED, patient's lactic acid 1.5 > 1.1.  Chest x-ray without any obvious opacities but has left base atelectasis.  His blood cultures are currently in process.  UA was positive for protein urinary, moderate leukocytes, nitrates, many bacteria.  His physical exam was significant for suprapubic tenderness. Treated with 1 g CTX in the ED and sent 500 mg keflex TID x 5 days, which patient reports that he did not pick up.   Will be able to pick up medication tomorrow since his wife works late at night until after medication. We will send this medication to a 24 hours pharmacy. There is also a [facility] about [place] south of where he lives.   ROS: per HPI Pertinent PMHx:  cirrhosis, splenomegaly, refugee status, urinary retention, b/l cataracts, HTN, thrombocytopenia  Exam:  There were no vitals taken for this visit.  Respiratory: speaking in full sentences. No SOB.   Notable Labs:  Creatinine 1.16 with GFR > 60. AST/ALT 81/50.  Assessment/Plan:  Fever Spoke to patient over the phone with translator.  Overall, patient reports that he is still feeling febrile but does not have a thermometer at home.  Overall, fever likely due to Covid.  Should also consider pyelonephritis in the setting of UTI.  Patient reports he did not pick up his Keflex after the emergency department.  He obtained 1 g ceftriaxone in the ED at 2 PM.  Spent most of the time with patient trying to get him access to medication as his wife works late and does not always have access to a car.  At first, try to send to 24-hour pharmacy, however, patient with poor vision and unable to write down the pharmacy address.  Patient does report [facility] is [facility].  [facility] which is [place] of where he lives.  Sending Keflex QID to this pharmacy.  Patient given return precautions for the emergency department, including shortness of breath, difficulty breathing, changes in mental status, inability to void. Given patient's language barrier, it would be difficult for him to get access to our after hours line for the weekend and given situation, patient directed to the  ED for any other concerning symptoms.    Time spent during visit with patient: 51 minutes  Wilber Oliphant, M.D.  4:49 PM 06/06/2020

## 2020-06-03 ENCOUNTER — Ambulatory Visit (HOSPITAL_COMMUNITY)
Admission: RE | Admit: 2020-06-03 | Discharge: 2020-06-03 | Disposition: A | Discharge status: Home / Self Care | Payer: Medicaid Other | Source: Ambulatory Visit | Attending: Pulmonary Disease | Admitting: Pulmonary Disease

## 2020-06-03 DIAGNOSIS — I10 Essential (primary) hypertension: Secondary | ICD-10-CM | POA: Diagnosis present

## 2020-06-03 DIAGNOSIS — U071 COVID-19: Secondary | ICD-10-CM

## 2020-06-03 MED ORDER — DIPHENHYDRAMINE HCL 50 MG/ML IJ SOLN: 50.0000 mg | Freq: Once | INTRAMUSCULAR | Status: DC | PRN

## 2020-06-03 MED ORDER — METHYLPREDNISOLONE SODIUM SUCC 125 MG IJ SOLR: 125.0000 mg | Freq: Once | INTRAMUSCULAR | Status: DC | PRN

## 2020-06-03 MED ORDER — EPINEPHRINE 0.3 MG/0.3ML IJ SOAJ: 0.3000 mg | Freq: Once | INTRAMUSCULAR | Status: DC | PRN

## 2020-06-03 MED ORDER — ALBUTEROL SULFATE HFA 108 (90 BASE) MCG/ACT IN AERS: 2.0000 | INHALATION_SPRAY | Freq: Once | RESPIRATORY_TRACT | Status: DC | PRN

## 2020-06-03 MED ORDER — SODIUM CHLORIDE 0.9 % IV SOLN: INTRAVENOUS | Status: DC | PRN

## 2020-06-03 MED ORDER — FAMOTIDINE IN NACL 20-0.9 MG/50ML-% IV SOLN: 20.0000 mg | Freq: Once | INTRAVENOUS | Status: DC | PRN

## 2020-06-03 NOTE — Discharge Instructions (Signed)
10 Things You Can Do to Manage Your COVID-19 Symptoms at Home If you have possible or confirmed COVID-19: 1. Stay home from work and school. And stay away from other public places. If you must go out, avoid using any kind of public transportation, ridesharing, or taxis. 2. Monitor your symptoms carefully. If your symptoms get worse, call your healthcare provider immediately. 3. Get rest and stay hydrated. 4. If you have a medical appointment, call the healthcare provider ahead of time and tell them that you have or may have COVID-19. 5. For medical emergencies, call 911 and notify the dispatch personnel that you have or may have COVID-19. 6. Cover your cough and sneezes with a tissue or use the inside of your elbow. 7. Wash your hands often with soap and water for at least 20 seconds or clean your hands with an alcohol-based hand sanitizer that contains at least 60% alcohol. 8. As much as possible, stay in a specific room and away from other people in your home. Also, you should use a separate bathroom, if available. If you need to be around other people in or outside of the home, wear a mask. 9. Avoid sharing personal items with other people in your household, like dishes, towels, and bedding. 10. Clean all surfaces that are touched often, like counters, tabletops, and doorknobs. Use household cleaning sprays or wipes according to the label instructions. cdc.gov/coronavirus 05/26/2019 This information is not intended to replace advice given to you by your health care provider. Make sure you discuss any questions you have with your health care provider. Document Revised: 10/28/2019 Document Reviewed: 10/28/2019 Elsevier Patient Education  2020 Elsevier Inc. 10 Things You Can Do to Manage Your COVID-19 Symptoms at Home If you have possible or confirmed COVID-19: 11. Stay home from work and school. And stay away from other public places. If you must go out, avoid using any kind of public  transportation, ridesharing, or taxis. 12. Monitor your symptoms carefully. If your symptoms get worse, call your healthcare provider immediately. 13. Get rest and stay hydrated. 14. If you have a medical appointment, call the healthcare provider ahead of time and tell them that you have or may have COVID-19. 15. For medical emergencies, call 911 and notify the dispatch personnel that you have or may have COVID-19. 16. Cover your cough and sneezes with a tissue or use the inside of your elbow. 17. Wash your hands often with soap and water for at least 20 seconds or clean your hands with an alcohol-based hand sanitizer that contains at least 60% alcohol. 18. As much as possible, stay in a specific room and away from other people in your home. Also, you should use a separate bathroom, if available. If you need to be around other people in or outside of the home, wear a mask. 19. Avoid sharing personal items with other people in your household, like dishes, towels, and bedding. 20. Clean all surfaces that are touched often, like counters, tabletops, and doorknobs. Use household cleaning sprays or wipes according to the label instructions. cdc.gov/coronavirus 05/26/2019 This information is not intended to replace advice given to you by your health care provider. Make sure you discuss any questions you have with your health care provider. Document Revised: 10/28/2019 Document Reviewed: 10/28/2019 Elsevier Patient Education  2020 Elsevier Inc. What types of side effects do monoclonal antibody drugs cause?  Common side effects  In general, the more common side effects caused by monoclonal antibody drugs include: . Allergic reactions,   such as hives or itching . Flu-like signs and symptoms, including chills, fatigue, fever, and muscle aches and pains . Nausea, vomiting . Diarrhea . Skin rashes . Low blood pressure   The CDC is recommending patients who receive monoclonal antibody treatments wait  at least 90 days before being vaccinated.  Currently, there are no data on the safety and efficacy of mRNA COVID-19 vaccines in persons who received monoclonal antibodies or convalescent plasma as part of COVID-19 treatment. Based on the estimated half-life of such therapies as well as evidence suggesting that reinfection is uncommon in the 90 days after initial infection, vaccination should be deferred for at least 90 days, as a precautionary measure until additional information becomes available, to avoid interference of the antibody treatment with vaccine-induced immune responses. 

## 2020-06-03 NOTE — Progress Notes (Signed)
0830 Pt arrived via transportation provided by [facility]. RN and NT picked up pt from car with the use of the interpreter services. Pt checked in, and assisted to recliner chair. VSS, IV started and medication infusion. RN then hung up with the interpreter as pt was comfortable in chair. [place] Interpreter used for discharge information, upcoming IV removal, VSS, and transportation ride home at 1000. Pt understands, all questions answered. WCTM.   1000 Pt discharged home, assisted to wheelchair, assisted into transporation car.

## 2020-06-03 NOTE — Progress Notes (Signed)
  Diagnosis: COVID-19  Physician: Dr. Asencion Noble  Procedure: [facility] Infusion [facility] Med: casirivimab\imdevimab infusion - Provided patient with casirivimab\imdevimab fact sheet for patients, parents and caregivers prior to infusion.  Complications: No immediate complications noted.  Discharge: Discharged home   Janine Ores 06/03/2020

## 2020-06-05 ENCOUNTER — Other Ambulatory Visit (HOSPITAL_COMMUNITY): Payer: Self-pay | Admitting: Gastroenterology

## 2020-06-05 ENCOUNTER — Other Ambulatory Visit: Payer: Self-pay | Admitting: Gastroenterology

## 2020-06-05 DIAGNOSIS — R772 Abnormality of alphafetoprotein: Secondary | ICD-10-CM

## 2020-06-06 ENCOUNTER — Other Ambulatory Visit: Payer: Self-pay | Admitting: Gastroenterology

## 2020-06-06 ENCOUNTER — Ambulatory Visit: Payer: Medicaid Other

## 2020-06-06 ENCOUNTER — Other Ambulatory Visit: Payer: Self-pay

## 2020-06-06 DIAGNOSIS — R509 Fever, unspecified: Secondary | ICD-10-CM | POA: Insufficient documentation

## 2020-06-06 DIAGNOSIS — R772 Abnormality of alphafetoprotein: Secondary | ICD-10-CM

## 2020-06-06 LAB — CULTURE, BLOOD (ROUTINE X 2)
Culture: NO GROWTH
Culture: NO GROWTH

## 2020-06-06 NOTE — Assessment & Plan Note (Addendum)
Spoke to patient over the phone with translator.  Overall, patient reports that he is still feeling febrile but does not have a thermometer at home.  Overall, fever likely due to Covid.  Should also consider pyelonephritis in the setting of UTI.  Patient reports he did not pick up his Keflex after the emergency department.  He obtained 1 g ceftriaxone in the ED at 2 PM.  Spent most of the time with patient trying to get him access to medication as his wife works late and does not always have access to a car.  At first, try to send to 24-hour pharmacy, however, patient with poor vision and unable to write down the pharmacy address.  Patient does report [facility].  [facility] which is about [place] blocks [place] of where he lives.  Sending Keflex QID to this pharmacy.  Patient given return precautions for the emergency department, including shortness of breath, difficulty breathing, changes in mental status, inability to void. Given patient's language barrier, it would be difficult for him to get access to our after hours line for the weekend and given situation, patient directed to the ED for any other concerning symptoms.

## 2020-06-06 NOTE — Patient Instructions (Signed)
Visit Information  Goals Addressed              This Visit's Progress   .  Follow up on patient on ED 06/01/20 (pt-stated)        CARE PLAN ENTRY (see longitudinal plan of care for additional care plan information)  Current Barriers:  . Chronic Disease Management support and education needs related to ED visit on 06/01/20 for Covid 19 infection and UTI associated with indwelling urethral catheter  - RNCM called to f/u with the patient to see how he and the family were doing . Language Barrier  Nurse Case Manager Clinical Goal(s):  Marland Kitchen Over the next 14 days, patient will work with [name] [name] to address needs related to Urinary Catheter F/u and Covid 19  Interventions:  . Inter-disciplinary care team collaboration (see longitudinal plan of care) . Spoke with the patient asking how he was feeling ok and was he able to void.   He stated that he had the catheter replaced.   I did not understand. I did not see in the notes where he had it replaced. The interpreter had problems with her phone and then we lost contact and could not get them back on the phone. Shirleen Schirmer #[id number]  From the notes He went to [facility] on 06/03/20 and had fluids given. . I sent a message to Chrystie Nose RN asking her to contact the family and check on them to see how they are doing and let me know.  Hoping it will not be as confusing when she calls.  Patient Self Care Activities:  . Attends all scheduled provider appointments . Unable to independently navigate the medical system due to language barrier  Initial goal documentation     .  COMPLETED: I need help with transportation (pt-stated)        CARE PLAN ENTRY (see longitudinal plan of care for additional care plan information)  Current Barriers:  . 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  Interventions:  . Evaluation of current treatment plan related to enlarged prostate and patient's adherence to plan as established by provider. . Advised patient to set up his voice mail with family so he will not miss messages using (interpreter) [place].  Patient verbalized understanding. . Discussed plans with patient for ongoing care management follow up and provided patient with direct contact information for care management team . Reviewed scheduled/upcoming provider appointments including: June 24th at 1030 with [facility] appointment made with patient on the phone with interpreter . [facility] will send the patient a packet a reminder card and a map. . Patient states that he did attend his appointment to GI on 04/04/20. . Care Guide referral sent for transportation . 05/15/20 . Called the patient at his home and spoke with his daughter present using interpreter [name]# [id number] to remind the patient about his appointments.  The daughter ,Mr Recore and his wife was present on the line.  Tried to give the patient information for his appointments but they seemed to be confused.  I told them about [name] RN  [phone] Congregational Nurse who speaks Swahili who is willing to work with the patient and help them navigate the medical system.  They gave me permission to give her their  information and I also gave them her number.  I told them that I would have her to call them and give them the appointment information again. . -Urology: Patient is supposed to see urology on June 24 @ 1030  (he will an interpreter) to have his urinary catheter removed.  [facility], office located at [address], [place], [place] [address]; Phone: [phone] .  . -Gastroenterology:  .  He has a follow up appointment with [facility] on 07/05/2020 at 1:15 pm with Dr. Therisa Doyne. (He has been set up  with transportation but will need to call a week in advance from what I was told) [facility] is located at [address], [place], [place] [address] phone [phone].  .  the daughter has the number for the transportation. [name] has my information if she has any questions or concerns.  [name] can be contacted through staff messages in epic. . 05/16/20  . [name] Muhuro has notified the son of the appointments for the patient.   Patient Self Care Activities:  . Patient verbalizes understanding of plan  . Attends all scheduled provider appointments . Unable to independently self manage scheduling appointments and transportation  Please see past updates related to this goal by clicking on the "Past Updates" button in the selected goal         Mr. Kraynak was given information about Care Management services today including:  1. Care Management services include personalized support from designated clinical staff supervised by his physician, including individualized plan of care and coordination with other care providers 2. 24/7 contact phone numbers for assistance for urgent and routine care needs. 3. The patient may stop CCM services at any time (effective at the end of the month) by phone call to the office staff.  Patient agreed to services and verbal consent obtained.   The patient verbalized understanding of instructions provided today and declined a print copy of patient instruction materials.   RNCM will F/U with Chrystie Nose RN to see how the family is doing.  [name] RN, BSN, [facility] Care Management Coordinator [place] Phone: [phone] Fax: [phone]

## 2020-06-06 NOTE — Chronic Care Management (AMB) (Signed)
Care Management   Follow Up Note   06/06/2020 Name: Jim Little MRN: [id number] DOB: [birth year]  Referred by: [name], MD Reason for referral : Chronic Care Management (ED F/U)   Jim Little is a 74 y.o. year old male who is a primary care patient of [name], MD. The care management team was consulted for assistance with care management and care coordination needs.    Review of patient status, including review of consultants reports, relevant laboratory and other test results, and collaboration with appropriate care team members and the patient's provider was performed as part of comprehensive patient evaluation and provision of chronic care management services.    SDOH (Social Determinants of Health) assessments performed: No See Care Plan activities for detailed interventions related to [facility])     Advanced Directives: See Care Plan and Vynca application for related entries.   Goals Addressed              This Visit's Progress   .  Follow up on patient on ED 06/01/20 (pt-stated)        CARE PLAN ENTRY (see longitudinal plan of care for additional care plan information)  Current Barriers:  . Chronic Disease Management support and education needs related to ED visit on 06/01/20 for Covid 19 infection and UTI associated with indwelling urethral catheter  - RNCM called to f/u with the patient to see how he and the family were doing . Language Barrier  Nurse Case Manager Clinical Goal(s):  Marland Kitchen Over the next 14 days, patient will work with [name] [name] to address needs related to Urinary Catheter F/u and Covid 19  Interventions:  . Inter-disciplinary care team collaboration (see longitudinal plan of care) . Spoke with the patient asking how he was feeling ok and was he able to void.   He stated that he had the catheter replaced.   I did not understand. I did not see in the notes where he had it replaced. The interpreter had problems with her phone and then we lost contact  and could not get them back on the phone. Shirleen Schirmer #[id number]  From the notes He went to [facility] on 06/03/20 and had fluids given. . I sent a message to Chrystie Nose RN asking her to contact the family and check on them to see how they are doing and let me know.  Hoping it will not be as confusing when she calls.  Patient Self Care Activities:  . Attends all scheduled provider appointments . Unable to independently navigate the medical system due to language barrier  Initial goal documentation     .  COMPLETED: I need help with transportation (pt-stated)        CARE PLAN ENTRY (see longitudinal plan of care for additional care plan information)  Current Barriers:  . 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  Interventions:  . Evaluation of current treatment plan related to enlarged prostate and patient's adherence to plan as established by provider. . Advised patient to set up his voice mail with family so he will not miss messages using (interpreter) [place].  Patient verbalized understanding. . Discussed plans with patient for ongoing care management follow up and provided patient with direct contact information for care management team .  Reviewed scheduled/upcoming provider appointments including: June 24th at 1030 with [facility] appointment made with patient on the phone with interpreter . [facility] will send the patient a packet a reminder card and a map. . Patient states that he did attend his appointment to GI on 04/04/20. . Care Guide referral sent for transportation . 05/15/20 . Called the patient at his home and spoke with his daughter present using interpreter [name]# [id number] to remind the patient about his  appointments.  The daughter ,Jim Little and his wife was present on the line.  Tried to give the patient information for his appointments but they seemed to be confused.  I told them about [name] RN  [phone] Congregational Nurse who speaks Swahili who is willing to work with the patient and help them navigate the medical system.  They gave me permission to give her their information and I also gave them her number.  I told them that I would have her to call them and give them the appointment information again. . -Urology: Patient is supposed to see urology on June 24 @ 1030  (he will an interpreter) to have his urinary catheter removed.  [facility], office located at [address], [place], [place] [address]; Phone: [phone] .  . -Gastroenterology:  .  He has a follow up appointment with [facility] on 07/05/2020 at 1:15 pm with Dr. Therisa Doyne. (He has been set up with transportation but will need to call a week in advance from what I was told) [facility] is located at [address], [place], [place] [address] phone [phone].  .  the daughter has the number for the transportation. [name] has my information if she has any questions or concerns.  [name] can be contacted through staff messages in epic. . 05/16/20  . [name] Muhuro has notified the son of the appointments for the patient.   Patient Self Care Activities:  . Patient verbalizes understanding of plan  . Attends all scheduled provider appointments . Unable to independently self manage scheduling appointments and transportation  Please see past updates related to this goal by clicking on the "Past Updates" button in the selected goal          RNCM will F/U with Chrystie Nose RN to see how the family is doing.  [name] RN, BSN, [facility] Care Management Coordinator [place] Phone: [phone] Fax: [phone]

## 2020-06-07 ENCOUNTER — Encounter (HOSPITAL_COMMUNITY): Payer: Self-pay

## 2020-06-07 ENCOUNTER — Telehealth: Payer: Self-pay

## 2020-06-07 ENCOUNTER — Ambulatory Visit: Payer: Medicaid Other

## 2020-06-07 NOTE — Telephone Encounter (Signed)
Attempted to call Mr Heidelberger, call not answered. Left a voice mail in swahili for him to return my call.I however talked with daughter in law [place] who stated that Mr Gleed is doing well and following all covid related instructions including self quarantine and  wearing a mask. I have reminded her of up coming appointments as well.  Eldena Dede RN BSn PCCn [phone] [phone]

## 2020-06-09 NOTE — Progress Notes (Signed)
Called pt with Language Interpreter of Sw[place] to review preop instructions. Patient stated that he was having problems getting transportation to procedure on 06/12/2020.  Placed a call to DR Therisa Doyne office and made them aware of above.

## 2020-06-12 ENCOUNTER — Ambulatory Visit (HOSPITAL_COMMUNITY): Admission: RE | Admit: 2020-06-12 | Payer: Medicaid Other | Source: Home / Self Care | Admitting: Gastroenterology

## 2020-06-12 ENCOUNTER — Encounter (HOSPITAL_COMMUNITY): Admission: RE | Payer: Self-pay | Source: Home / Self Care

## 2020-06-12 SURGERY — COLONOSCOPY WITH PROPOFOL
Anesthesia: Monitor Anesthesia Care

## 2020-06-15 ENCOUNTER — Inpatient Hospital Stay: Payer: Medicaid Other

## 2020-06-15 ENCOUNTER — Telehealth: Payer: Self-pay

## 2020-06-15 NOTE — Telephone Encounter (Signed)
I have called patient`s son with appointment reminder for tomorrow @0910 . Call not picked up but I did send a text to him. [name] Pike Scantlebury RN BSn PCCN [phone] [phone]

## 2020-06-16 ENCOUNTER — Ambulatory Visit: Payer: Medicaid Other | Admitting: Family Medicine

## 2020-06-21 ENCOUNTER — Ambulatory Visit: Payer: Medicaid Other

## 2020-06-21 ENCOUNTER — Other Ambulatory Visit: Payer: Self-pay | Admitting: Radiology

## 2020-06-21 ENCOUNTER — Telehealth: Payer: Self-pay

## 2020-06-21 ENCOUNTER — Other Ambulatory Visit (HOSPITAL_COMMUNITY): Payer: Medicaid Other

## 2020-06-21 NOTE — Chronic Care Management (AMB) (Signed)
Care Management   Follow Up Note   06/21/2020 Name: Jim Little MRN: [id number] DOB: Mar 11, 1946  Referred by: [name], MD Reason for referral : Chronic Care Management (Appointment Reminder)   Jim Little is a 74 y.o. year old male who is a primary care patient of [name], MD. The care management team was consulted for assistance with care management and care coordination needs.    Review of patient status, including review of consultants reports, relevant laboratory and other test results, and collaboration with appropriate care team members and the patient's provider was performed as part of comprehensive patient evaluation and provision of chronic care management services.    SDOH (Social Determinants of Health) assessments performed: No See Care Plan activities for detailed interventions related to [facility])     Advanced Directives: See Care Plan and Vynca application for related entries.   Goals Addressed              This Visit's Progress   .  Follow up on patient on ED 06/01/20 (pt-stated)        CARE PLAN ENTRY (see longitudinal plan of care for additional care plan information)  Current Barriers:  . Chronic Disease Management support and education needs related to ED visit on 06/01/20 for Covid 19 infection and UTI associated with indwelling urethral catheter  - RNCM called to f/u with the patient to see how he and the family were doing . Language Barrier  Nurse Case Manager Clinical Goal(s):  Marland Kitchen Over the next 14 days, patient will work with [name] [name] to address needs related to Urinary Catheter F/u and Covid 19  Interventions:  . Inter-disciplinary care team collaboration (see longitudinal plan of care) . Spoke with the patient asking how he was feeling ok and was he able to void.   He stated that he had the catheter replaced.   I did not understand. I did not see in the notes where he had it replaced. The interpreter had problems with her phone and then we  lost contact and could not get them back on the phone. Shirleen Schirmer #[id number]  From the notes He went to [facility] on 06/03/20 and had fluids given. . I sent a message to Chrystie Nose RN asking her to contact the family and check on them to see how they are doing and let me know.  Hoping it will not be as confusing when she calls. . 06/21/20 . Called Patient using interpreter [place] Clap # [phone] to remind him that he has an appointment tomorrow at [facility] for a Biopsy.  No Answer Message left. Holli Humbles Muhuro RN  Asking her to connect with the family and remind the patient of his appointment for the Biopsy 06/22/20.  [name] is always so cooperative and helping to provide the family with information. Marland Kitchen Collaborated with Dr brown and the CCM team [name] is planning to make a home visit.  After her home visit she will get back with PCP and CCM team and bring reccomendations of how we can best support the patient and family.  Patient Self Care Activities:  . Attends all scheduled provider appointments . Unable to independently navigate the medical system due to language barrier  Please see past updates related to this goal by clicking on the "Past Updates" button in the selected goal          The care management team will reach out to the patient again over the next 14  days.   [name] RN, BSN, [facility] Care Management Coordinator [place] Phone: [phone] Fax: [phone]

## 2020-06-21 NOTE — Telephone Encounter (Signed)
Contacted patient son Mr [name] to remind him of his dad`s appointment on 06/22/20 at [facility]. Patient instructed to go to admission upon arrival and have nothing by mouth. Mr Herschel Senegal requested for hospital address and same was provided.I communicated this via whatsapp texting App in [place].He verbalized understanding. [name] Treyshawn Muldrew RN BSn PCCN [phone] [phone]

## 2020-06-21 NOTE — Patient Instructions (Signed)
Visit Information  Goals Addressed              This Visit's Progress     Follow up on patient on ED 06/01/20 (pt-stated)        CARE PLAN ENTRY (see longitudinal plan of care for additional care plan information)  Current Barriers:   Chronic Disease Management support and education needs related to ED visit on 06/01/20 for Covid 19 infection and UTI associated with indwelling urethral catheter  - RNCM called to f/u with the patient to see how he and the family were doing  Language Barrier  Nurse Case Manager Clinical Goal(s):   Over the next 14 days, patient will work with Jim Little to address needs related to Urinary Catheter F/u and Covid 19  Interventions:   Inter-disciplinary care team collaboration (see longitudinal plan of care)  Spoke with the patient asking how he was feeling ok and was he able to void.   He stated that he had the catheter replaced.   I did not understand. I did not see in the notes where he had it replaced. The interpreter had problems with her phone and then we lost contact and could not get them back on the phone. Jim Little #[id number]  From the notes He went to [facility] on 06/03/20 and had fluids given.  I sent a message to Jim Nose RN asking her to contact the family and check on them to see how they are doing and let me know.  Hoping it will not be as confusing when she calls.  06/21/20  Called Patient using interpreter Jim Little # [phone] to remind him that he has an appointment tomorrow at [facility] for a Biopsy.  No Answer Message left.  Called Jim Muhuro RN  Asking her to connect with the family and remind the patient of his appointment for the Biopsy 06/22/20.  Jim Server is always so cooperative and helping to provide the family with information.  Collaborated with Dr brown and the CCM team Jim Server is planning to make a home visit.  After her home visit she will get back with PCP and CCM team and bring reccomendations of how we can  best support the patient and family.  Patient Self Care Activities:   Attends all scheduled provider appointments  Unable to independently navigate the medical system due to language barrier  Please see past updates related to this goal by clicking on the "Past Updates" button in the selected goal         Jim Little was given information about Care Management services today including:  1. Care Management services include personalized support from designated clinical staff supervised by his physician, including individualized plan of care and coordination with other care providers 2. 24/7 contact phone numbers for assistance for urgent and routine care needs. 3. The patient may stop CCM services at any time (effective at the end of the month) by phone call to the office staff.  Patient agreed to services and verbal consent obtained.   The patient verbalized understanding of instructions provided today and declined a print copy of patient instruction materials.   The care management team will reach out to the patient again over the next 14 days.   [name] RN, BSN, [facility] Care Management Coordinator [place] Phone: [phone] Fax: [phone]

## 2020-06-22 ENCOUNTER — Telehealth: Payer: Self-pay

## 2020-06-22 ENCOUNTER — Encounter (HOSPITAL_COMMUNITY): Payer: Self-pay

## 2020-06-22 ENCOUNTER — Ambulatory Visit (HOSPITAL_COMMUNITY): Admission: RE | Admit: 2020-06-22 | Payer: Medicaid Other | Source: Ambulatory Visit

## 2020-06-22 ENCOUNTER — Telehealth (HOSPITAL_COMMUNITY): Payer: Self-pay

## 2020-06-22 NOTE — Telephone Encounter (Signed)
Contacted Jim Jim Little at 1130 am to check whether they went to [facility] for biopsy> He returned my call @1155  am stating that he did not make it although I had called him yesterday with a reminder.I also contacted [facility] to see if he can still be seen today but unfortunately he will need to reschedule. Again, I have emphasized the importance of compliance with medical appointment. I requested a meeting with the family today and he agreed. I will meet with Jim Little, his wife and the rest of the family to discuss how I can be of help in meeting their medical needs. Jim Pinedo RN BSN PCCn [phone] [phone].

## 2020-06-28 ENCOUNTER — Telehealth: Payer: Self-pay | Admitting: *Deleted

## 2020-06-28 NOTE — Chronic Care Management (AMB) (Signed)
  Care Management   Note  06/28/2020 Name: Jim Little MRN: [id number] DOB: July 24, 1946  Jim Little is a 74 y.o. year old male who is a primary care patient of [name], MD and is actively engaged with the care management team. I reached out to Janyce Llanos by phone today to assist with re-scheduling a follow up visit with the RN Case Manager.  Follow up plan: Unsuccessful telephone outreach attempt made. A HIPPA compliant phone message was left for the patient providing contact information and requesting a return call. The care management team will reach out to the patient again over the next 7 days. If patient returns call to provider office, please advise to call [place] at [place], [place] [facility]  [place], [place] [address] Direct Dial: [email or web address] Website: [email or web address]

## 2020-06-29 ENCOUNTER — Other Ambulatory Visit: Payer: Self-pay | Admitting: Family Medicine

## 2020-06-29 DIAGNOSIS — R399 Unspecified symptoms and signs involving the genitourinary system: Secondary | ICD-10-CM

## 2020-06-29 DIAGNOSIS — K7469 Other cirrhosis of liver: Secondary | ICD-10-CM

## 2020-07-03 ENCOUNTER — Telehealth: Payer: Self-pay

## 2020-07-03 NOTE — Telephone Encounter (Signed)
Contacted [facility] Ill and Confirmed appointment with Dr Therisa Doyne on Wednesday August 11th @3 :30 pm. I have called and informed MR Whittingham of the same. I will scheduled transportation for this appointment. [name] Ricki Clack Rn BSn PCCn [phone] [phone]

## 2020-07-03 NOTE — Telephone Encounter (Signed)
Contacted [facility] to confirm appointments. Jim Little is not scheduled for appointment at this time. [name] RN BSn PCCN [phone] [phone]

## 2020-07-05 ENCOUNTER — Telehealth: Payer: Self-pay

## 2020-07-05 NOTE — Telephone Encounter (Signed)
I contacted Mr Jim Little and connected him with Kandra Nicolas of [place] [employer] using a 3 way call with pacific interpreter. Telephone appointment scheduled for August 23rd @1030am . [name] Perris Conwell RN BSN PCCN  [phone]

## 2020-07-05 NOTE — Telephone Encounter (Signed)
I have contacted [facility] services for transportation assistance to and from the [facility] appointment. [name] Burle Kwan RN BSN PCCN [phone]

## 2020-07-05 NOTE — Chronic Care Management (AMB) (Signed)
  Care Management   Note  07/05/2020 Name: Jim Little MRN: [id number] DOB: 06-20-1946  Jim Little is a 74 y.o. year old male who is a primary care patient of [name], MD and is actively engaged with the care management team. I reached out to Janyce Llanos by phone today to assist with re-scheduling a follow up visit with the RN Case Manager.  Follow up plan: Telephone appointment with care management team member scheduled for: 07/17/2020.  [place], [place] [address] Direct Dial: [phone] [email or web address]: [email or web address]

## 2020-07-06 ENCOUNTER — Telehealth: Payer: Self-pay | Admitting: Family Medicine

## 2020-07-06 ENCOUNTER — Telehealth: Payer: Medicaid Other

## 2020-07-06 MED ORDER — AMLODIPINE BESYLATE 5 MG PO TABS: 5.0000 mg | ORAL_TABLET | Freq: Every day | ORAL | 3 refills | Status: DC

## 2020-07-06 NOTE — Congregational Nurse Program (Signed)
  Dept: [place] Nurse Program Note  Date of Encounter: 07/06/2020  Past Medical History: Past Medical History:  Diagnosis Date  . BPH (benign prostatic hyperplasia)   . Cirrhosis ([place])   . Splenomegaly     Encounter Details:  CNP Questionnaire - 07/05/20 1530      Questionnaire   Patient Status Refugee    Race African    Location Patient Served At [place] Medicaid    Uninsured Not Applicable    Food No food insecurities    Housing/Utilities Yes, have permanent housing    Transportation Provided transportation assistance (bus pass, taxi voucher, etc.)    Interpersonal Safety Yes, feel physically and emotionally safe where you currently live    Medication No medication insecurities    Medical Provider Yes    Referrals Primary Care Provider/Clinic    ED Visit Averted Not Applicable    Life-Saving Intervention Made Not Applicable         7/47/18 1530 hrs- I accompanied Mr Schoonmaker to [facility] appointment with Dr  Therisa Doyne. Blood pressure was elevated during this appointment. Patient is not taking blood pressure medications. Re-educated patient on compliance. He was not aware that he needed to refill. I will reach out to Dr brown for refill. Noted scheduled appointments for colonoscopy EGD and liver biopsy. I will assist Mr Andringa in keeping up with these appointments. [name] Jamiyla Ishee RN BSN PCCn [phone]

## 2020-07-06 NOTE — Telephone Encounter (Signed)
-----   Message from Tonye Royalty, RN sent at 07/06/2020  7:47 AM EDT ----- Regarding: Blood pressure meds Hello Dr Owens Shark, I accompanied Jim Little to GI appointment yesterday with Dr Therisa Doyne. Blood pressure was elevated SBP 178. He is not taking Norvasc. To my understanding, he is only taking Flomax. Please, review his medication and send them for refill.I will assist him to understand when and how to take his meds.   Dr Therisa Doyne has scheduled him for colonoscopy/EGD and liver biopsy within the next couple of months.I will assist Jim Little with these appointments. [name].

## 2020-07-06 NOTE — Telephone Encounter (Signed)
Hi Jim Little--thank you very much for the follow up.  I have sent 90 days of amlodipine to [facility] at the corner of [address] and [address].  Let me know if you need additional refills etc.   Thank you, [name], MD  [facility] Medicine Teaching Service

## 2020-07-07 ENCOUNTER — Telehealth: Payer: Self-pay

## 2020-07-07 NOTE — Telephone Encounter (Signed)
I have contacted Jim Little and his son [name] and discussed medication for blood pressure. Son will pick up the medicine from pharmacy today. I have also discussed upcoming appointments  1- Liver biopsy 09/11/20 @0900  2-Colonoscopy/EGD 08/12/20 @0945  3-[facility] follow up 09/11/20 @1 :5  Congregational Nurse program will assist with transportation for these appointments.  Caeli Linehan RN BSn PCCn  [phone]

## 2020-07-10 ENCOUNTER — Other Ambulatory Visit: Payer: Self-pay | Admitting: Family Medicine

## 2020-07-10 ENCOUNTER — Ambulatory Visit: Payer: Medicaid Other

## 2020-07-10 DIAGNOSIS — K7469 Other cirrhosis of liver: Secondary | ICD-10-CM

## 2020-07-10 NOTE — Chronic Care Management (AMB) (Signed)
  Care Management   Follow Up Note   07/10/2020 Name: Jim Little MRN: [id number] DOB: 09/22/1946  Referred by: [name], MD Reason for referral : No chief complaint on file.   Jim Little is a 74 y.o. year old male who is a primary care patient of [name], MD. The care management team was consulted for assistance with care management and care coordination needs.    Review of patient status, including review of consultants reports, relevant laboratory and other test results, and collaboration with appropriate care team members and the patient's provider was performed as part of comprehensive patient evaluation and provision of chronic care management services.    SDOH (Social Determinants of Health) assessments performed: No See Care Plan activities for detailed interventions related to [facility])     Advanced Directives: See Care Plan and Vynca application for related entries.   Goals Addressed              This Visit's Progress   .  Need help with appointments (pt-stated)        CARE PLAN ENTRY (see longitudinal plan of care for additional care plan information)  Current Barriers:  . Care Coordination needs related to Urology and Gastroenterology in a patient with Hepatic Cirrhosis and Urinary retention  . Chronic Disease Management support and education needs related to Hepatic Cirrhosis and Urinary retention   Nurse Case Manager Clinical Goal(s):  Marland Kitchen Over the next 60 days, patient will work with [facility] and [name] [name] to address needs related to patient care  Interventions:  . Inter-disciplinary care team collaboration (see longitudinal plan of care) . Evaluation of current treatment plan and patient's adherence to plan as established by provider. Marland Kitchen Collaborated with Honor Loh  RN regarding patient care and appointments . Discussed plans with patient for ongoing care management follow up and provided patient with direct contact information for care management  team . Reviewed scheduled/upcoming provider appointments including: Sent information to D Cassie Freer [name] RN  Patient has an appointment  with [facility]  07/24/20 @ 10:00 am # [phone] [address] [address].  Marland Kitchen  He has Medicaid  transportation that can be called and set up.  He is allowed for someone to go with him .  The number is [phone]. . spoke with Reuben Likes at [facility] and Mr. Laurel has an appointment scheduled with Dr Therisa Doyne on 08-22-20 at 11:15 for an Endoscopy and Colonoscopy at [facility] [place], [place]. Marland Kitchen RNCM has spoken with  Honor Loh RN she will notify the patient about the appointment information.  I have emailed the prep to [name] that the patient will need to follow for his appointment at [facility].    Patient Self Care Activities:  . Patient verbalizes understanding of plan with help of D. [name] Therapist, sports . Attends all scheduled provider appointments with help from D. [name] Therapist, sports. Marland Kitchen Unable to independently self navigate the medical system  Initial goal documentation         RNCM will follow up in 2 weeks with [name] [name] RN to see if patient kept his appointment on 8/30 21.  [name] RN, BSN, [facility] Care Management Coordinator [place] Phone: [phone] Fax: [phone]

## 2020-07-10 NOTE — Congregational Nurse Program (Signed)
  Dept: [place] Nurse Program Note  Date of Encounter: 07/10/2020  Past Medical History: Past Medical History:  Diagnosis Date  . BPH (benign prostatic hyperplasia)   . Cirrhosis ([place])   . Splenomegaly     Encounter Details:  CNP Questionnaire - 07/10/20 0900      Questionnaire   Patient Status Refugee    Race African    Location Patient Served At [place] Medicaid    Uninsured Not Applicable    Food No food insecurities    Housing/Utilities Yes, have permanent housing    Transportation Provided transportation assistance (bus pass, taxi voucher, etc.)    Interpersonal Safety Yes, feel physically and emotionally safe where you currently live    Medication Provided medication assistance    Medical Provider Yes    Referrals Primary Care Provider/Clinic    ED Visit Averted Not Applicable    Life-Saving Intervention Made Not Applicable          I have picked up prescription from Dr Encarnacion Slates office and delivered to [place] for medication assistance with [facility] congregational nurse program. Honor Loh RN BSN PCCn [phone]

## 2020-07-10 NOTE — Patient Instructions (Signed)
Visit Information  Goals Addressed              This Visit's Progress   .  Need help with appointments (pt-stated)        CARE PLAN ENTRY (see longitudinal plan of care for additional care plan information)  Current Barriers:  . Care Coordination needs related to Urology and Gastroenterology in a patient with Hepatic Cirrhosis and Urinary retention  . Chronic Disease Management support and education needs related to Hepatic Cirrhosis and Urinary retention   Nurse Case Manager Clinical Goal(s):  Marland Kitchen Over the next 60 days, patient will work with [facility] and [name] [name] to address needs related to patient care  Interventions:  . Inter-disciplinary care team collaboration (see longitudinal plan of care) . Evaluation of current treatment plan and patient's adherence to plan as established by provider. Marland Kitchen Collaborated with Honor Loh  RN regarding patient care and appointments . Discussed plans with patient for ongoing care management follow up and provided patient with direct contact information for care management team . Reviewed scheduled/upcoming provider appointments including: Sent information to D Cassie Freer [name] RN  Patient has an appointment  with [facility]  07/24/20 @ 10:00 am # [phone] [address] [address].  Marland Kitchen  He has Medicaid  transportation that can be called and set up.  He is allowed for someone to go with him .  The number is [phone]. . spoke with Reuben Likes at [facility] and Mr. Morillo has an appointment scheduled with Dr Therisa Doyne on 08-22-20 at 11:15 for an Endoscopy and Colonoscopy at [facility] [place], [place]. Marland Kitchen RNCM has spoken with  Honor Loh RN she will notify the patient about the appointment information.  I have emailed the prep to [name] that the patient will need to follow for his appointment at [facility].    Patient Self Care Activities:  . Patient verbalizes understanding of plan with help of D. [name] Therapist, sports . Attends all  scheduled provider appointments with help from D. [name] Therapist, sports. Marland Kitchen Unable to independently self navigate the medical system  Initial goal documentation       RNCM will follow up in 2 weeks with [name] [name] RN to see if patiet hkept his appointment on 07/24/20.   [name] RN, BSN, [facility] Care Management Coordinator [place] Phone: [phone] Fax: [phone]

## 2020-07-13 ENCOUNTER — Other Ambulatory Visit: Payer: Self-pay | Admitting: Physician Assistant

## 2020-07-13 ENCOUNTER — Other Ambulatory Visit: Payer: Medicaid Other

## 2020-07-14 ENCOUNTER — Ambulatory Visit (HOSPITAL_COMMUNITY)
Admission: RE | Admit: 2020-07-14 | Discharge: 2020-07-14 | Disposition: A | Discharge status: Home / Self Care | Payer: Medicaid Other | Source: Ambulatory Visit | Attending: Gastroenterology | Admitting: Gastroenterology

## 2020-07-14 ENCOUNTER — Other Ambulatory Visit: Payer: Self-pay

## 2020-07-14 ENCOUNTER — Encounter (HOSPITAL_COMMUNITY): Payer: Self-pay

## 2020-07-14 ENCOUNTER — Telehealth: Payer: Self-pay

## 2020-07-14 DIAGNOSIS — R772 Abnormality of alphafetoprotein: Secondary | ICD-10-CM | POA: Diagnosis present

## 2020-07-14 DIAGNOSIS — R160 Hepatomegaly, not elsewhere classified: Secondary | ICD-10-CM | POA: Insufficient documentation

## 2020-07-14 LAB — CBC
HCT: 39.8 % (ref 39.0–52.0)
Hemoglobin: 13.4 g/dL (ref 13.0–17.0)
MCH: 34.7 pg — ABNORMAL HIGH (ref 26.0–34.0)
MCHC: 33.7 g/dL (ref 30.0–36.0)
MCV: 103.1 fL — ABNORMAL HIGH (ref 80.0–100.0)
Platelets: 120 10*3/uL — ABNORMAL LOW (ref 150–400)
RBC: 3.86 MIL/uL — ABNORMAL LOW (ref 4.22–5.81)
RDW: 14.8 % (ref 11.5–15.5)
WBC: 4.4 10*3/uL (ref 4.0–10.5)
nRBC: 0 % (ref 0.0–0.2)

## 2020-07-14 LAB — PROTIME-INR
INR: 1.2 (ref 0.8–1.2)
Prothrombin Time: 15 seconds (ref 11.4–15.2)

## 2020-07-14 MED ORDER — FENTANYL CITRATE (PF) 100 MCG/2ML IJ SOLN
INTRAMUSCULAR | Status: AC | PRN
  Administered 2020-07-14: 25 ug via INTRAVENOUS

## 2020-07-14 MED ORDER — LIDOCAINE HCL (PF) 1 % IJ SOLN
INTRAMUSCULAR | Status: AC
  Filled 2020-07-14: qty 30

## 2020-07-14 MED ORDER — FENTANYL CITRATE (PF) 100 MCG/2ML IJ SOLN
INTRAMUSCULAR | Status: DC
Start: 2020-07-14 — End: 2020-07-15
  Filled 2020-07-14: qty 2

## 2020-07-14 MED ORDER — SODIUM CHLORIDE 0.9 % IV SOLN: INTRAVENOUS | Status: DC

## 2020-07-14 MED ORDER — MIDAZOLAM HCL 2 MG/2ML IJ SOLN
INTRAMUSCULAR | Status: AC | PRN
  Administered 2020-07-14: 1 mg via INTRAVENOUS

## 2020-07-14 MED ORDER — GELATIN ABSORBABLE 12-7 MM EX MISC
CUTANEOUS | Status: AC
  Filled 2020-07-14: qty 1

## 2020-07-14 MED ORDER — MIDAZOLAM HCL 2 MG/2ML IJ SOLN
INTRAMUSCULAR | Status: AC
  Filled 2020-07-14: qty 2

## 2020-07-14 NOTE — Progress Notes (Signed)
Pt's BP continuing to rise. Paged Larene Beach, NP. Informed her Jim Newport MD was not wanting to treat pressure but wanted to make sure it was okay since it was continuing to rise. Will instruct pt to take BP medicine when he gets home since he has not taken his dose due for today.

## 2020-07-14 NOTE — Telephone Encounter (Signed)
Contacted  patient son to pick up patient from [facility] s/p liver biopsy procedure.Upon arrival, I discussed discharge instructions in swahili. He verbalized understanding. Patient transported home by Sharyon Cable via private vehicle. [name] Brynlei Klausner RN BSN PCCN  [phone]

## 2020-07-14 NOTE — Congregational Nurse Program (Signed)
  Dept: [place] Nurse Program Note  Date of Encounter: 07/14/2020  Past Medical History: Past Medical History:  Diagnosis Date  . BPH (benign prostatic hyperplasia)   . Cirrhosis ([place])   . Splenomegaly     Encounter Details:  CNP Questionnaire - 07/14/20 0926      Questionnaire   Patient Status Refugee    Race African    Location Patient Served At [place] Medicaid    Uninsured Not Applicable    Food No food insecurities    Housing/Utilities Yes, have permanent housing    Transportation Provided transportation assistance (bus pass, taxi voucher, etc.)    Interpersonal Safety Yes, feel physically and emotionally safe where you currently live    Medication Provided medication assistance    Medical Provider Yes    Referrals Primary Care Provider/Clinic    ED Visit Averted Not Applicable    Life-Saving Intervention Made Not Applicable          Contacted [facility] transportation services for a ride to [facility]. Client is to have liver biopsy done today. I have assisted client with check in process  for today`s procedure. [name] Emonte Dieujuste RN BSn PCCN [phone]

## 2020-07-14 NOTE — Discharge Instructions (Signed)
Make sure you take your medications when you get home, especially your blood pressure medication  Liver Biopsy, Care After These instructions give you information on caring for yourself after your procedure. Your doctor may also give you more specific instructions. Call your doctor if you have any problems or questions after your procedure. What can I expect after the procedure? After the procedure, it is common to have:  Pain and soreness where the biopsy was done.  Bruising around the area where the biopsy was done.  Sleepiness and be tired for a few days. Follow these instructions at home: Medicines  Take over-the-counter and prescription medicines only as told by your doctor.  If you were prescribed an antibiotic medicine, take it as told by your doctor. Do not stop taking the antibiotic even if you start to feel better.  Do not take medicines such as aspirin and ibuprofen. These medicines can thin your blood. Do not take these medicines unless your doctor tells you to take them.  If you are taking prescription pain medicine, take actions to prevent or treat constipation. Your doctor may recommend that you: ? Drink enough fluid to keep your pee (urine) clear or pale yellow. ? Take over-the-counter or prescription medicines. ? Eat foods that are high in fiber, such as fresh fruits and vegetables, whole grains, and beans. ? Limit foods that are high in fat and processed sugars, such as fried and sweet foods. Caring for your cut  Follow instructions from your doctor about how to take care of your cuts from surgery (incisions). Make sure you: ? Wash your hands with soap and water before you change your bandage (dressing). If you cannot use soap and water, use hand sanitizer. ? Remove your bandage as told by your doctor. In 24 hours  Check your cuts every day for signs of infection. Check for: ? Redness, swelling, or more pain. ? Fluid or blood. ? Pus or a bad  smell. ? Warmth.  Do not take baths, swim, or use a hot tub until your doctor says it is okay to do so. Activity   Rest at home for 1-2 days or as told by your doctor. ? Avoid sitting for a long time without moving. Get up to take short walks every 1-2 hours.  Return to your normal activities as told by your doctor. Ask what activities are safe for you.  Do not do these things in the first 24 hours: ? Drive. ? Use machinery. ? Take a bath or shower.  Do not lift more than 10 pounds (4.5 kg) or play contact sports for the first 2 weeks. General instructions   Do not drink alcohol in the first week after the procedure.  Have someone stay with you for at least 24 hours after the procedure.  Get your test results. Ask your doctor or the department that is doing the test: ? When will my results be ready? ? How will I get my results? ? What are my treatment options? ? What other tests do I need? ? What are my next steps?  Keep all follow-up visits as told by your doctor. This is important. Contact a doctor if:  A cut bleeds and leaves more than just a small spot of blood.  A cut is red, puffs up (swells), or hurts more than before.  Fluid or something else comes from a cut.  A cut smells bad.  You have a fever or chills. Get help right away if:  You have swelling, bloating, or pain in your belly (abdomen).  You get dizzy or faint.  You have a rash.  You feel sick to your stomach (nauseous) or throw up (vomit).  You have trouble breathing, feel short of breath, or feel faint.  Your chest hurts.  You have problems talking or seeing.  You have trouble with your balance or moving your arms or legs. Summary  After the procedure, it is common to have pain, soreness, bruising, and tiredness.  Your doctor will tell you how to take care of yourself at home. Change your bandage, take your medicines, and limit your activities as told by your doctor.  Call your doctor  if you have symptoms of infection. Get help right away if your belly swells, your cut bleeds a lot, or you have trouble talking or breathing. This information is not intended to replace advice given to you by your health care provider. Make sure you discuss any questions you have with your health care provider. Document Revised: 11/21/2017 Document Reviewed: 11/21/2017 Elsevier Patient Education  2020 [employer].

## 2020-07-14 NOTE — H&P (Signed)
Chief Complaint: Patient was seen in consultation today for liver lesion  Referring Physician(s): Ronnette Juniper  Supervising Physician: Corrie Mckusick  Patient Status: [facility] - Out-pt  History of Present Illness: Jim Little is a 74 y.o. male with past medical history of BPH, cirrhosis, and splenomegaly recently undergoing work-up with Dr. Therisa Doyne for right hepatic lesion with features suggestive of focal fatty infiltration, however atypical enhancing pattern.  AFP found to be elevated at 560. IR consulted for liver lesion biopsy at the request of Dr. Therisa Doyne.  Dr. Earleen Newport has discussed with Dr. Therisa Doyne.  He has reviewed imaging and approves the patient for the procedure today.    Jim Little is assessed at bedside today alongside Swahili interpreter.  He denies fever, chills, nausea, vomiting, abdominal pain, cough, shortness of breath. He has been NPO today.  Arranged transportation home with his case manager [name].  She will have wife transported to hospital to ride home with the patient and will be staying with him overnight.   Past Medical History:  Diagnosis Date  . BPH (benign prostatic hyperplasia)   . Cirrhosis ([place])   . Splenomegaly     Past Surgical History:  Procedure Laterality Date  . UPPER GASTROINTESTINAL ENDOSCOPY  03/2018   [place] --candidiasis plus esophageal varices grade 2.  Treated with fluconazole and status post esophageal banding.  Marland Kitchen US ECHOCARDIOGRAPHY  10/2016   per overseas records from [place] - LVEF 68%; mildly calcified aortic valve, normal function otherwise    Allergies: Patient has no known allergies.  Medications: Prior to Admission medications   Medication Sig Start Date End Date Taking? Authorizing Provider  acetaminophen (TYLENOL) 500 MG tablet Take 500 mg by mouth 2 (two) times daily.   Yes [provider]  amLODipine (NORVASC) 5 MG tablet Take 1 tablet (5 mg total) by mouth daily. 07/06/20  Yes [name], MD    azelastine (OPTIVAR) 0.05 % ophthalmic solution Place 1 drop into both eyes 2 (two) times daily. 01/31/20  Yes [name], MD  famotidine (PEPCID) 20 MG tablet Take 1 tablet (20 mg total) by mouth daily. 05/15/20  Yes Milus Banister C, DO  fluticasone (FLONASE) 50 MCG/ACT nasal spray Place 2 sprays into both nostrils daily. 03/06/20  Yes [name], MD  folic acid (FOLVITE) 1 MG tablet Take 1 tablet (1 mg total) by mouth daily. 05/15/20  Yes Milus Banister C, DO  Ibuprofen-Acetaminophen 125-250 MG TABS Take 1 tablet by mouth 2 (two) times daily.   Yes [provider]  Olopatadine HCl 0.2 % SOLN Apply 1 drop to eye daily. 03/06/20  Yes [name], MD  tamsulosin (FLOMAX) 0.4 MG CAPS capsule Take 1 capsule (0.4 mg total) by mouth daily. 05/15/20  Yes [name], DO     History reviewed. No pertinent family history.  Social History   Socioeconomic History  . Marital status: Married    Spouse name: Not on file  . Number of children: Not on file  . Years of education: Not on file  . Highest education level: Not on file  Occupational History  . Not on file  Tobacco Use  . Smoking status: Never Smoker  . Smokeless tobacco: Never Used  Substance and Sexual Activity  . Alcohol use: Never  . Drug use: Not on file  . Sexual activity: Not on file  Other Topics Concern  . Not on file  Social History Narrative  . Not on file   Social  Determinants of Health   Financial Resource Strain:   . Difficulty of Paying Living Expenses: Not on file  Food Insecurity:   . Worried About Charity fundraiser in the Last Year: Not on file  . Ran Out of Food in the Last Year: Not on file  Transportation Needs:   . Lack of Transportation (Medical): Not on file  . Lack of Transportation (Non-Medical): Not on file  Physical Activity:   . Days of Exercise per Week: Not on file  . Minutes of Exercise per Session: Not on file  Stress:   . Feeling of Stress : Not on file   Social Connections:   . Frequency of Communication with Friends and Family: Not on file  . Frequency of Social Gatherings with Friends and Family: Not on file  . Attends Religious Services: Not on file  . Active Member of Clubs or Organizations: Not on file  . Attends Archivist Meetings: Not on file  . Marital Status: Not on file     Review of Systems: A 12 point ROS discussed and pertinent positives are indicated in the HPI above.  All other systems are negative.  Review of Systems  Constitutional: Negative for fatigue and fever.  Respiratory: Negative for cough and shortness of breath.   Cardiovascular: Negative for chest pain.  Gastrointestinal: Negative for abdominal pain, diarrhea, nausea and vomiting.  Genitourinary: Negative for dysuria.  Musculoskeletal: Negative for back pain.  Psychiatric/Behavioral: Negative for behavioral problems and confusion.    Vital Signs: BP (!) 190/83 (BP Location: Left Arm)   Pulse (!) 58   Temp 98.6 F (37 C) (Oral)   Resp 14   SpO2 100%   Physical Exam Vitals and nursing note reviewed.  Constitutional:      General: He is not in acute distress.    Appearance: Normal appearance. He is not ill-appearing.  HENT:     Mouth/Throat:     Mouth: Mucous membranes are moist.     Pharynx: Oropharynx is clear.  Cardiovascular:     Rate and Rhythm: Normal rate and regular rhythm.  Pulmonary:     Effort: Pulmonary effort is normal. No respiratory distress.     Breath sounds: Normal breath sounds.  Abdominal:     General: Abdomen is flat.     Palpations: Abdomen is soft.  Skin:    General: Skin is warm and dry.  Neurological:     General: No focal deficit present.     Mental Status: He is alert and oriented to person, place, and time. Mental status is at baseline.  Psychiatric:        Mood and Affect: Mood normal.        Behavior: Behavior normal.        Thought Content: Thought content normal.        Judgment: Judgment  normal.      MD Evaluation Airway: WNL Heart: WNL Abdomen: WNL Chest/ Lungs: WNL ASA  Classification: 3 Mallampati/Airway Score: One   Imaging: No results found.  Labs:  CBC: Recent Labs    05/13/20 0345 05/14/20 0455 06/01/20 1222 07/14/20 1030  WBC 5.7 5.0 4.7 4.4  HGB 11.2* 11.1* 12.9* 13.4  HCT 32.6* 32.0* 38.7* 39.8  PLT 51* 61* 92* 120*    COAGS: Recent Labs    05/10/20 2009 05/10/20 2148 05/11/20 0437 07/14/20 1030  INR 1.6*  --  1.6* 1.2  APTT  --  39*  --   --  BMP: Recent Labs    05/12/20 0353 05/13/20 0345 05/14/20 0455 06/01/20 1222  NA 138 136 138 136  K 4.2 3.6 3.7 4.6  CL 111 108 108 104  CO2 23 22 25 26   GLUCOSE 80 95 101* 97  BUN 20 15 10  6*  CALCIUM 8.4* 8.2* 8.1* 8.6*  CREATININE 1.03 0.83 0.47* 1.16  GFRNONAA >60 >60 >60 >60  GFRAA >60 >60 >60 >60    LIVER FUNCTION TESTS: Recent Labs    05/10/20 2009 05/11/20 0437 05/12/20 0353 06/01/20 1222  BILITOT 1.8* 1.7* 1.1 0.9  AST 47* 40 36 81*  ALT 27 23 21  50*  ALKPHOS 141* 103 85 139*  PROT 7.8 6.6 6.0* 7.9  ALBUMIN 3.1* 2.6* 2.2* 3.3*    TUMOR MARKERS: No results for input(s): AFPTM, CEA, CA199, CHROMGRNA in the last 8760 hours.  Assessment and Plan: Patient with past medical history of BPH presents with complaint of elevated APF, liver lesion identified by MR.  IR consulted for liver biopsy at the request of Dr. Therisa Doyne.  Dr. Earleen Newport has discussed with Dr. Therisa Doyne to confirm that a liver lesion biopsy is requested. He has reviewed the case and approves patient for procedure.  Patient presents today in their usual state of health.  He has been NPO and is not currently on blood thinners.    Risks and benefits of liver lesion biopsy was discussed with the patient and/or patient's family including, but not limited to bleeding, infection, damage to adjacent structures or low yield requiring additional tests.  All of the questions were answered and there is agreement to  proceed.  Consent signed and in chart.    Thank you for this interesting consult.  I greatly enjoyed meeting Charod Slawinski and look forward to participating in their care.  A copy of this report was sent to the requesting provider on this date.  Electronically Signed: Docia Barrier, PA 07/14/2020, 2:12 PM   I spent a total of 40 Minutes    in face to face in clinical consultation, greater than 50% of which was counseling/coordinating care for liver lesion.

## 2020-07-14 NOTE — Procedures (Signed)
Interventional Radiology Procedure Note  Procedure: US guided biopsy of liver mass, right liver mass Complications: None EBL: None Recommendations: - Bedrest 2 hours.   - Routine wound care - Follow up pathology - Advance diet   Signed,  Corrie Mckusick, DO

## 2020-07-17 ENCOUNTER — Telehealth: Payer: Medicaid Other

## 2020-07-17 ENCOUNTER — Telehealth: Payer: Self-pay

## 2020-07-17 NOTE — Telephone Encounter (Signed)
I called to check on Jim Little s/p Liver biopsy.He reports to be doing well with no pain, discomfort bleeding or drainage at the site of biopsy. He is taking all medication prescribed except flomax. I will call pharmacy for refill. [name] Areesha Dehaven RN BSn PCCN [phone]

## 2020-07-18 ENCOUNTER — Other Ambulatory Visit: Payer: Self-pay | Admitting: Family Medicine

## 2020-07-18 ENCOUNTER — Other Ambulatory Visit: Payer: Self-pay

## 2020-07-18 LAB — SURGICAL PATHOLOGY

## 2020-07-20 ENCOUNTER — Other Ambulatory Visit: Payer: Self-pay | Admitting: Family Medicine

## 2020-07-20 MED ORDER — TAMSULOSIN HCL 0.4 MG PO CAPS
0.4000 mg | ORAL_CAPSULE | Freq: Every day | ORAL | 3 refills | Status: DC
Start: 2020-07-20 — End: 2020-11-27

## 2020-07-20 NOTE — Progress Notes (Signed)
Rx for Flomax to pharmacy.  [name], MD  Family Medicine Teaching Service

## 2020-07-23 ENCOUNTER — Telehealth: Payer: Self-pay

## 2020-07-23 NOTE — Telephone Encounter (Signed)
Received a call from Dr Encarnacion Slates nurse with biopsy results. I have explained the results to Jim Little in swahili and he verbalized understanding.All questions answered.  Honor Loh RN [facility] Nurse program [phone]

## 2020-07-23 NOTE — Telephone Encounter (Signed)
Contacted Me [name] patient son to remind him of appointment with urology tomorrow. Honor Loh RN BSn PCCN  [facility] [phone]

## 2020-08-03 ENCOUNTER — Ambulatory Visit: Payer: Medicaid Other

## 2020-08-03 NOTE — Patient Instructions (Signed)
Visit Information  Goals Addressed              This Visit's Progress   .  Need help with appointments (pt-stated)        CARE PLAN ENTRY (see longitudinal plan of care for additional care plan information)  Current Barriers:  . Care Coordination needs related to Urology and Gastroenterology in a patient with Hepatic Cirrhosis and Urinary retention  . Chronic Disease Management support and education needs related to Hepatic Cirrhosis and Urinary retention   Nurse Case Manager Clinical Goal(s):  Marland Kitchen Over the next 60 days, patient will work with [facility] and [name] [name] to address needs related to patient care  Interventions:  . Inter-disciplinary care team collaboration (see longitudinal plan of care) . Evaluation of current treatment plan and patient's adherence to plan as established by provider. Marland Kitchen Collaborated with Honor Loh  RN regarding patient care and appointments . Discussed plans with patient for ongoing care management follow up and provided patient with direct contact information for care management team . Reviewed scheduled/upcoming provider appointments including:  At the request of Honor Loh RN [facility] rescheduled the appointment with  [facility] to 08/28/20@ 10:15 am # [phone] [address] [address].  Marland Kitchen  He has Medicaid  transportation that can be called and set up.  He is allowed for someone to go with him .  The number is [phone]. Marland Kitchen RNCM has notified   Honor Loh RN with a staff message and she will notify the patient with the appointment information.    Patient Self Care Activities:  . Patient verbalizes understanding of plan with help of D. [name] Therapist, sports . Attends all scheduled provider appointments with help from D. [name] Therapist, sports. Marland Kitchen Unable to independently self navigate the medical system  Please see past updates related to this goal by clicking on the "Past Updates" button in the selected goal         Jim Little was given information about Care  Management services today including:  1. Care Management services include personalized support from designated clinical staff supervised by his physician, including individualized plan of care and coordination with other care providers 2. 24/7 contact phone numbers for assistance for urgent and routine care needs. 3. The patient may stop CCM services at any time (effective at the end of the month) by phone call to the office staff.  Patient agreed to services and verbal consent obtained.   The patient verbalized understanding of instructions provided today and declined a print copy of patient instruction materials.   No further follow up required   [place], BSN, [place] Phone: [phone] Fax: [phone]

## 2020-08-03 NOTE — Chronic Care Management (AMB) (Signed)
  Care Management   Follow Up Note   08/03/2020 Name: Marik Sedore MRN: [id number] DOB: December 19, 1945  Referred by: [name], MD Reason for referral : Appointment ([place] urology appointment)   Juandaniel Manfredo is a 74 y.o. year old male who is a primary care patient of [name], MD. The care management team was consulted for assistance with care management and care coordination needs.    Review of patient status, including review of consultants reports, relevant laboratory and other test results, and collaboration with appropriate care team members and the patient's provider was performed as part of comprehensive patient evaluation and provision of chronic care management services.    SDOH (Social Determinants of Health) assessments performed: No See Care Plan activities for detailed interventions related to [facility])     Advanced Directives: See Care Plan and Vynca application for related entries.   Goals Addressed              This Visit's Progress   .  Need help with appointments (pt-stated)        CARE PLAN ENTRY (see longitudinal plan of care for additional care plan information)  Current Barriers:  . Care Coordination needs related to Urology and Gastroenterology in a patient with Hepatic Cirrhosis and Urinary retention  . Chronic Disease Management support and education needs related to Hepatic Cirrhosis and Urinary retention   Nurse Case Manager Clinical Goal(s):  Marland Kitchen Over the next 60 days, patient will work with [facility] and [name] [name] to address needs related to patient care  Interventions:  . Inter-disciplinary care team collaboration (see longitudinal plan of care) . Evaluation of current treatment plan and patient's adherence to plan as established by provider. Marland Kitchen Collaborated with Honor Loh  RN regarding patient care and appointments . Discussed plans with patient for ongoing care management follow up and provided patient with direct contact information for  care management team . Reviewed scheduled/upcoming provider appointments including:  At the request of Honor Loh RN [facility] rescheduled the appointment with  [facility] to 08/28/20@ 10:15 am # [phone] [address] [address].  Marland Kitchen  He has Medicaid  transportation that can be called and set up.  He is allowed for someone to go with him .  The number is [phone]. Marland Kitchen RNCM has notified   Honor Loh RN with a staff message and she will notify the patient with the appointment information.    Patient Self Care Activities:  . Patient verbalizes understanding of plan with help of D. [name] Therapist, sports . Attends all scheduled provider appointments with help from D. [name] Therapist, sports. Marland Kitchen Unable to independently self navigate the medical system  Please see past updates related to this goal by clicking on the "Past Updates" button in the selected goal          No further follow up required   [place], BSN, [place] Phone: [phone] Fax: [phone]

## 2020-08-04 ENCOUNTER — Telehealth: Payer: Self-pay

## 2020-08-04 NOTE — Telephone Encounter (Signed)
Mr Leja made aware of urology appointment for October 4th @10 :15 am And that a ride will be scheduled for him. Honor Loh RN BSN [place] [phone]

## 2020-08-21 NOTE — Congregational Nurse Program (Signed)
  Dept: [facility] Nurse Program Note  Date of Encounter: 08/21/2020  Past Medical History: Past Medical History:  Diagnosis Date  . BPH (benign prostatic hyperplasia)   . Cirrhosis ([place])   . Splenomegaly    1300 hrs- Picked up bowel pre medication from [place] and delivered to his house. Confirmed that he is on clear liquid diet. Instructions provided on how to mix and take  Golytely with teach back.  1400 hrs- Reminded him via telephone to take 2 Dulcolax   1600 hrs- Reminded him via telephone to take 2 Dulcolax  1800 hrs-  Reminded patient to take 2 dulcolax and to start drinking Golytely  2030 hrs-Patient is reporting no bowel movement despite reporting to have taken significant amount of Golytely. He says his stomach feels full but no nausea. Patient reassured.  I will call him again before bedtime to check on progress.   [name] Keiyon Plack RN BSn PCCn  [facility] [phone]

## 2020-08-21 NOTE — H&P (Signed)
History of Present Illness  General:          A [place] nurse ([name] [name], RN)who speaks Swahili was present with the patient for interpretation.        He has had problems wtih language, he has 2 children with cerebral palsy at home.        74/male was seen in the hospital for newly diagnosed cirrhosis(he is originally from [place] of [place], was at a refugee camp in [place]).        His labs showed an elevated AFP of 560, minimally elevated ASMA of 20 and elevated AMA.        Normal ceruloplasmin, normal alpha 1 antitrypsin, elevated Iron saturation of 75% with normal ferritin.HBSab was positive(immune), HCV Ab was negative.        He has had USG, MRI and US doppler, which showed liver lesions, atypical fatty infiltration.        He was advised to get Liver biopsy, EGD for screening for esophageal varices and colonoscopy for screening, all of which he was scheduled for but cancelled in the past.        Labs on EPIC from 6/21 and 7/21 showed normal TB and Cr of 1.16, INR of 1.6, elevated AST/ALT, low plts.        He had COVID on 06/01/20, did not need hospitalized, has recovered.        Overall, he is doing well. He had urinary catheter which was removed.        Denies nausea, vomiting, acid reflux, heartburn.        He has normal Bms now, he had constipation for some time,denies rectal bleeding or black stools.        He has had unintentional weight loss, around 26 lbs since he came to [place], 2 years ago, but denies loss of appetite.     Current Medications  Taking  .Tamsulosin HCl 0.4 MG Capsule 1 capsule Orally Once a day    Medication List reviewed and reconciled with the patient     Past Medical History       Sepsis.       Urinary retention.       Thrombocytopenia.       Bilateral Cararacts.       Splenomegaly.       Chronic back pain.       Hepatic cirrhosis.       Lower urinary tract symptoms.       Hypertension.    Surgical History        EGD 2019         US Echocardiography 2017        eye surgery 2 yrs ago     Family History  Negative family hx of colon ca, polyps , pt has liver disease in remoission.       Social History  General:   Tobacco use       cigarettes:  Never smoked     Tobacco history last updated  07/05/2020 no Alcohol.  no Recreational drug use.      Allergies  N.K.D.A.      Hospitalization/Major Diagnostic Procedure  Sepsis 04/2020    Review of Systems  GI PROCEDURE:          no Pacemaker/ AICD, no.  no Artificial heart valves.  no MI/heart attack.  no Abnormal heart rhythm.  no Angina.  no CVA.  Hypertension  YES.  no Hypotension.  no  Asthma, COPD.  no Sleep apnea.  no Seizure disorders.  no Artificial joints.  no Severe DJD.  no Diabetes.  no Significant headaches.  no Vertigo.  no Depression/anxiety.  no Abnormal bleeding.  no Kidney Disease.  Liver disease  yes, no.  no Chance of pregnancy.  no Blood transfusion.  no Method of Birth Control.  no Birth control pills.       Vital Signs  Wt 146, Wt change 1 lb, Ht 62, BMI 26.70, Temp 97.6, Pulse sitting 70, BP sitting 178/75, Repeat BP 177/84 Pt Nurse states he hasnt taken any bp med -ST,MA.     Examination  Gastroenterology::        GENERAL APPEARANCE: Well developed, thinly built, pleasant, no acute distress .         SCLERA: anicteric.         CARDIOVASCULAR PMI LS border. Normal RRR w/o murmers or gallops. No peripheral edema.         RESPIRATORY Breath sounds normal. Respiration even and unlabored.         ABDOMEN No masses palpated. Liver and spleen not palpated, normal. Bowel sounds normal, Abdomen not distended.         EXTREMITIES: No edema.         NEURO: alert,oriented to time,place and person, no asterixis.         PSYCH: mood/affect normal.       Assessments  1. Other cirrhosis of liver - K74.69 (Primary)  2. 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 Screen for colon cancer - Z12.11  4. Liver lesion - K76.9  5. Elevated AFP -  R77.2    Treatment  1. Other cirrhosis of liver         LAB: PT (Prothrombin Time) ([id number])       LAB: CBC without Diff       LAB: Comp Metabolic Panel       LAB: AFP, Serum, Tumor Marker ([id number])       IMAGING: Esophagoscopy Patient will need updated labs for [facility] calculation. Meanwhile was scheduled patient for an EGD for variceal screening along with ultrasound guided liver biopsy.      2. Antimitochondrial antibody positive         IMAGING: Liver Biopsy Notes: Since patient has elevated alpha-fetoprotein and elevated AMA will get ultrasound guided liver biopsy.        3. Screen for colon cancer         IMAGING: Colonoscopy Notes: The risks and benefits of the procedure we discussed with the patient in details. He understands and verbalizes consent. He will be given written instructions, prescription for preparation and will be scheduled for the same.      4. Liver lesion         IMAGING: Liver Biopsy Notes: MRI showed focal fat sparing, atypical enhancement, would proceed with ultrasound guided liver biopsy.     5. Elevated AFP         IMAGING: Liver Biopsy Notes: MRI showed focal fat sparing, atypical enhancement, would proceed with ultrasound guided liver biopsy.             Lab: AFP, Serum, Tumor Marker       AFP, Serum, Tumor Marker 1570.0 H (0.0-8.3 - ng/mL )      Prothrombin Time 13.3 H       INR 1.3 H

## 2020-08-22 ENCOUNTER — Ambulatory Visit (HOSPITAL_COMMUNITY): Payer: Medicaid Other | Admitting: Anesthesiology

## 2020-08-22 ENCOUNTER — Encounter (HOSPITAL_COMMUNITY)
Admission: RE | Disposition: A | Discharge status: Home / Self Care | Payer: Self-pay | Source: Home / Self Care | Attending: Gastroenterology

## 2020-08-22 ENCOUNTER — Encounter (HOSPITAL_COMMUNITY): Payer: Self-pay | Admitting: Gastroenterology

## 2020-08-22 ENCOUNTER — Ambulatory Visit (HOSPITAL_COMMUNITY)
Admission: RE | Admit: 2020-08-22 | Discharge: 2020-08-22 | Disposition: A | Discharge status: Home / Self Care | Payer: Medicaid Other | Attending: Gastroenterology | Admitting: Gastroenterology

## 2020-08-22 ENCOUNTER — Other Ambulatory Visit: Payer: Self-pay

## 2020-08-22 ENCOUNTER — Telehealth: Payer: Self-pay

## 2020-08-22 DIAGNOSIS — K7460 Unspecified cirrhosis of liver: Secondary | ICD-10-CM | POA: Insufficient documentation

## 2020-08-22 DIAGNOSIS — R772 Abnormality of alphafetoprotein: Secondary | ICD-10-CM | POA: Insufficient documentation

## 2020-08-22 DIAGNOSIS — K766 Portal hypertension: Secondary | ICD-10-CM | POA: Insufficient documentation

## 2020-08-22 DIAGNOSIS — B9681 Helicobacter pylori [H. pylori] as the cause of diseases classified elsewhere: Secondary | ICD-10-CM | POA: Insufficient documentation

## 2020-08-22 DIAGNOSIS — K2970 Gastritis, unspecified, without bleeding: Secondary | ICD-10-CM | POA: Diagnosis not present

## 2020-08-22 DIAGNOSIS — Z1211 Encounter for screening for malignant neoplasm of colon: Secondary | ICD-10-CM | POA: Insufficient documentation

## 2020-08-22 DIAGNOSIS — R768 Other specified abnormal immunological findings in serum: Secondary | ICD-10-CM | POA: Insufficient documentation

## 2020-08-22 DIAGNOSIS — K3189 Other diseases of stomach and duodenum: Secondary | ICD-10-CM | POA: Insufficient documentation

## 2020-08-22 DIAGNOSIS — I10 Essential (primary) hypertension: Secondary | ICD-10-CM | POA: Diagnosis not present

## 2020-08-22 DIAGNOSIS — Z8616 Personal history of COVID-19: Secondary | ICD-10-CM | POA: Insufficient documentation

## 2020-08-22 HISTORY — PX: ESOPHAGOGASTRODUODENOSCOPY (EGD) WITH PROPOFOL: SHX5813

## 2020-08-22 HISTORY — DX: Essential (primary) hypertension: I10

## 2020-08-22 HISTORY — PX: COLONOSCOPY WITH PROPOFOL: SHX5780

## 2020-08-22 HISTORY — PX: BIOPSY: SHX5522

## 2020-08-22 SURGERY — COLONOSCOPY WITH PROPOFOL
Anesthesia: Monitor Anesthesia Care

## 2020-08-22 MED ORDER — LIDOCAINE 2% (20 MG/ML) 5 ML SYRINGE
INTRAMUSCULAR | Status: DC | PRN
  Administered 2020-08-22: 100 mg via INTRAVENOUS

## 2020-08-22 MED ORDER — LACTATED RINGERS IV SOLN: INTRAVENOUS | Status: DC | PRN

## 2020-08-22 MED ORDER — PROPOFOL 10 MG/ML IV BOLUS
INTRAVENOUS | Status: DC | PRN
  Administered 2020-08-22 (×3): 20 mg via INTRAVENOUS

## 2020-08-22 MED ORDER — PROPOFOL 500 MG/50ML IV EMUL
INTRAVENOUS | Status: DC | PRN
  Administered 2020-08-22: 125 ug/kg/min via INTRAVENOUS

## 2020-08-22 MED ORDER — PROPOFOL 500 MG/50ML IV EMUL
INTRAVENOUS | Status: AC
  Filled 2020-08-22: qty 50

## 2020-08-22 MED ORDER — PROPOFOL 10 MG/ML IV BOLUS
INTRAVENOUS | Status: AC
  Filled 2020-08-22: qty 20

## 2020-08-22 SURGICAL SUPPLY — 24 items

## 2020-08-22 NOTE — Progress Notes (Signed)
Pt travel screen updated, rn unable to correct original entry at 1134, correct entry entered at [employer]

## 2020-08-22 NOTE — Telephone Encounter (Signed)
8:30am Contacted Jim Little to schedule a ride to [facility] for colonoscopy today. Pick up time was 9:10am  -Honor Loh , RN BSN [facility]. [phone]

## 2020-08-22 NOTE — Brief Op Note (Signed)
08/22/2020  2:00 PM  PATIENT:  Jim Little  74 y.o. male  PRE-OPERATIVE DIAGNOSIS:  Screen of colon ca, esophageal varices  POST-OPERATIVE DIAGNOSIS:  EGD: no varices, biopsy antrum of stomach r/o h. pylori, portal HTN, gastropathy, deformed pyloris Colonoscopy: mucosal friability  PROCEDURE:  Procedure(s): COLONOSCOPY WITH PROPOFOL (N/A) ESOPHAGOGASTRODUODENOSCOPY (EGD) WITH PROPOFOL (N/A) BIOPSY  SURGEON:  Surgeon(s) and Role:    Ronnette Juniper, MD - Primary  PHYSICIAN ASSISTANT:   ASSISTANTS: Sandy Andrews,RN,Chris Chandler,Tech  ANESTHESIA:   MAC  EBL:  None  BLOOD ADMINISTERED:none  DRAINS: none   LOCAL MEDICATIONS USED:  NONE  SPECIMEN:  Biopsy / Limited Resection  DISPOSITION OF SPECIMEN:  PATHOLOGY  COUNTS:  YES  TOURNIQUET:  * No tourniquets in log *  DICTATION: .Dragon Dictation  PLAN OF CARE: Discharge to home after PACU  PATIENT DISPOSITION:  PACU - hemodynamically stable.   Delay start of Pharmacological VTE agent (>24hrs) due to surgical blood loss or risk of bleeding: not applicable

## 2020-08-22 NOTE — Op Note (Signed)
[facility] Patient Name: Jim Little Procedure Date: 08/22/2020 MRN: [id number] Attending MD: Ronnette Juniper , MD Date of Birth: 06/16/1946 CSN: [id number] Age: 74 Admit Type: Outpatient Procedure:                Colonoscopy Indications:              Screening for colorectal malignant neoplasm, This                            is the patient's first colonoscopy Providers:                Ronnette Juniper, MD, Clyde Lundborg, RN, Elspeth Cho                            Tech., Technician, Danley Danker, CRNA Referring MD:             [name], MD Medicines:                Monitored Anesthesia Care Complications:            No immediate complications. Estimated Blood Loss:     Estimated blood loss: none. Procedure:                Pre-Anesthesia Assessment:                           - Prior to the procedure, a History and Physical                            was performed, and patient medications and                            allergies were reviewed. The patient's tolerance of                            previous anesthesia was also reviewed. The risks                            and benefits of the procedure and the sedation                            options and risks were discussed with the patient.                            All questions were answered, and informed consent                            was obtained. Prior Anticoagulants: The patient has                            taken no previous anticoagulant or antiplatelet                            agents. ASA Grade Assessment: III - A patient with  severe systemic disease. After reviewing the risks                            and benefits, the patient was deemed in                            satisfactory condition to undergo the procedure.                           After obtaining informed consent, the colonoscope                            was passed under direct vision. Throughout the                             procedure, the patient's blood pressure, pulse, and                            oxygen saturations were monitored continuously. The                            PCF-H190DL ([id number] Olympus pediatric colonscope                            was introduced through the anus and advanced to the                            the cecum, identified by appendiceal orifice and                            ileocecal valve. The colonoscopy was performed with                            moderate difficulty due to significant looping.                            Successful completion of the procedure was aided by                            changing the patient to a supine position. The                            patient tolerated the procedure well. The quality                            of the bowel preparation was fair. Scope In: 1:21:15 PM Scope Out: 1:47:54 PM Scope Withdrawal Time: 0 hours 9 minutes 3 seconds  Total Procedure Duration: 0 hours 26 minutes 39 seconds  Findings:      The perianal and digital rectal examinations were normal.      A moderate amount of liquid semi-liquid stool was found in the entire       colon, making visualization difficult. Lavage of the area was performed       using copious amounts, resulting in  clearance with fair visualization.       Two suction canisters(1 L each) was filled at the end of lavage and       suction.      The exam was otherwise without abnormality on direct and retroflexion       views.      Mucosal friability was noted. Impression:               - Preparation of the colon was fair.                           - Stool in the entire examined colon.                           - The examination was otherwise normal on direct                            and retroflexion views.                           - No specimens collected. Moderate Sedation:      Patient did not receive moderate sedation for this procedure, but       instead received  monitored anesthesia care. Recommendation:           - Patient has a contact number available for                            emergencies. The signs and symptoms of potential                            delayed complications were discussed with the                            patient. Return to normal activities tomorrow.                            Written discharge instructions were provided to the                            patient.                           - Resume regular diet.                           - Continue present medications.                           - Repeat colonoscopy in 3 years for screening                            purposes due to fair prep. Procedure Code(s):        --- Professional ---                           G8115, Colorectal cancer screening; colonoscopy on  individual not meeting criteria for high risk Diagnosis Code(s):        --- Professional ---                           Z12.11, Encounter for screening for malignant                            neoplasm of colon CPT copyright 2019 American Medical Association. All rights reserved. The codes documented in this report are preliminary and upon coder review may  be revised to meet current compliance requirements. Ronnette Juniper, MD 08/22/2020 2:00:42 PM This report has been signed electronically. Number of Addenda: 0

## 2020-08-22 NOTE — Op Note (Signed)
[facility] Patient Name: Jim Little Procedure Date: 08/22/2020 MRN: [id number] Attending MD: Ronnette Juniper , MD Date of Birth: 1946/11/01 CSN: [id number] Age: 74 Admit Type: Outpatient Procedure:                Upper GI endoscopy Indications:              Cirrhosis rule out esophageal varices Providers:                Ronnette Juniper, MD, Clyde Lundborg, RN, Elspeth Cho                            Tech., Technician, Danley Danker, CRNA Referring MD:             [name], MD Medicines:                Monitored Anesthesia Care Complications:            No immediate complications. Estimated Blood Loss:     Estimated blood loss was minimal. Procedure:                Pre-Anesthesia Assessment:                           - Prior to the procedure, a History and Physical                            was performed, and patient medications and                            allergies were reviewed. The patient's tolerance of                            previous anesthesia was also reviewed. The risks                            and benefits of the procedure and the sedation                            options and risks were discussed with the patient.                            All questions were answered, and informed consent                            was obtained. Prior Anticoagulants: The patient has                            taken no previous anticoagulant or antiplatelet                            agents. ASA Grade Assessment: III - A patient with                            severe systemic disease. After reviewing the risks  and benefits, the patient was deemed in                            satisfactory condition to undergo the procedure.                           After obtaining informed consent, the endoscope was                            passed under direct vision. Throughout the                            procedure, the patient's blood pressure, pulse,  and                            oxygen saturations were monitored continuously. The                            [id number]-[id number] ([id number]) Olympus gastroscope was                            introduced through the mouth, and advanced to the                            second part of duodenum. The upper GI endoscopy was                            accomplished without difficulty. The patient                            tolerated the procedure well. Scope In: Scope Out: Findings:      The examined esophagus was normal.      The Z-line was regular.      Diffuse portal hypertensive gastropathy was found in the gastric body.      The cardia and gastric fundus were normal on retroflexion.      A deformity was found in the prepyloric region of the stomach.      There was erythema, friability and luminal narrowing noted in the       pylorus, although the scope could be advanced without       resistance.Possibly from prior ulceration.      Biopsies were taken with a cold forceps for histology.      The duodenal bulb, first portion of the duodenum and second portion of       the duodenum were normal. Impression:               - Normal esophagus.                           - Z-line regular.                           - Portal hypertensive gastropathy.                           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                           - Normal duodenal bulb, first portion of the                            duodenum and second portion of the duodenum. Moderate Sedation:      Patient did not receive moderate sedation for this procedure, but       instead received monitored anesthesia care. Recommendation:           - Perform a colonoscopy today. Procedure Code(s):        --- Professional ---                           [phone], Esophagogastroduodenoscopy, flexible,                            transoral; with biopsy, single or multiple Diagnosis Code(s):         --- Professional ---                           K76.6, Portal hypertension                           K31.89, Other diseases of stomach and duodenum                           K74.60, Unspecified cirrhosis of liver CPT copyright 2019 American Medical Association. All rights reserved. The codes documented in this report are preliminary and upon coder review may  be revised to meet current compliance requirements. Ronnette Juniper, MD 08/22/2020 1:57:51 PM This report has been signed electronically. Number of Addenda: 0

## 2020-08-22 NOTE — Transfer of Care (Signed)
Immediate Anesthesia Transfer of Care Note  Patient: Jim Little  Procedure(s) Performed: COLONOSCOPY WITH PROPOFOL (N/A ) ESOPHAGOGASTRODUODENOSCOPY (EGD) WITH PROPOFOL (N/A ) BIOPSY  Patient Location: Endoscopy Unit  Anesthesia Type:MAC  Level of Consciousness: drowsy  Airway & Oxygen Therapy: Patient Spontanous Breathing and Patient connected to face mask oxygen  Post-op Assessment: Report given to RN and Post -op Vital signs reviewed and stable  Post vital signs: Reviewed and stable  Last Vitals:  Vitals Value Taken Time  BP    Temp    Pulse 77 08/22/20 1356  Resp 15 08/22/20 1356  SpO2 100 % 08/22/20 1356  Vitals shown include unvalidated device data.  Last Pain:  Vitals:   08/22/20 1115  TempSrc: Temporal  PainSc: 0-No pain         Complications: No complications documented.

## 2020-08-22 NOTE — Discharge Instructions (Signed)

## 2020-08-22 NOTE — Anesthesia Preprocedure Evaluation (Signed)
Anesthesia Evaluation  Patient identified by MRN, date of birth, ID band Patient awake    Reviewed: Allergy & Precautions, NPO status , Patient's Chart, lab work & pertinent test results  Airway Mallampati: II  TM Distance: >3 FB Neck ROM: Full    Dental no notable dental hx.    Pulmonary neg pulmonary ROS,    Pulmonary exam normal breath sounds clear to auscultation       Cardiovascular hypertension, Pt. on medications Normal cardiovascular exam Rhythm:Regular Rate:Normal     Neuro/Psych negative neurological ROS  negative psych ROS   GI/Hepatic negative GI ROS, (+) Cirrhosis   Esophageal Varices    ,   Endo/Other  negative endocrine ROS  Renal/GU negative Renal ROS  negative genitourinary   Musculoskeletal negative musculoskeletal ROS (+)   Abdominal   Peds negative pediatric ROS (+)  Hematology negative hematology ROS (+)   Anesthesia Other Findings   Reproductive/Obstetrics negative OB ROS                             Anesthesia Physical Anesthesia Plan  ASA: III  Anesthesia Plan: MAC   Post-op Pain Management:    Induction: Intravenous  PONV Risk Score and Plan: 0  Airway Management Planned: Simple Face Mask  Additional Equipment:   Intra-op Plan:   Post-operative Plan:   Informed Consent: I have reviewed the patients History and Physical, chart, labs and discussed the procedure including the risks, benefits and alternatives for the proposed anesthesia with the patient or authorized representative who has indicated his/her understanding and acceptance.     Dental advisory given  Plan Discussed with: CRNA and Surgeon  Anesthesia Plan Comments:         Anesthesia Quick Evaluation

## 2020-08-23 ENCOUNTER — Other Ambulatory Visit: Payer: Self-pay

## 2020-08-23 LAB — SURGICAL PATHOLOGY

## 2020-08-23 NOTE — Anesthesia Postprocedure Evaluation (Signed)
Anesthesia Post Note  Patient: Jim Little  Procedure(s) Performed: COLONOSCOPY WITH PROPOFOL (N/A ) ESOPHAGOGASTRODUODENOSCOPY (EGD) WITH PROPOFOL (N/A ) BIOPSY     Patient location during evaluation: PACU Anesthesia Type: MAC Level of consciousness: awake and alert Pain management: pain level controlled Vital Signs Assessment: post-procedure vital signs reviewed and stable Respiratory status: spontaneous breathing, nonlabored ventilation, respiratory function stable and patient connected to nasal cannula oxygen Cardiovascular status: stable and blood pressure returned to baseline Postop Assessment: no apparent nausea or vomiting Anesthetic complications: no   No complications documented.  Last Vitals:  Vitals:   08/22/20 1401 08/22/20 1420  BP:  (!) 151/85  Pulse:  66  Resp:  19  Temp: 36.7 C   SpO2:  98%    Last Pain:  Vitals:   08/22/20 1420  TempSrc:   PainSc: 0-No pain                 Laurie Lovejoy S

## 2020-08-24 ENCOUNTER — Encounter (HOSPITAL_COMMUNITY): Payer: Self-pay | Admitting: Gastroenterology

## 2020-08-24 NOTE — Progress Notes (Signed)
That would be great. I have faced several hurdles with him.  I would recommend Amoxicillin 500 mg 2 pills BID for 14 days Clarithromycin 500 mg BID for 14 days PPI BID for 14 days Also, stool for H pylori Ag in 6 weeks post treatment to confirm eradication. Thank You!

## 2020-08-26 ENCOUNTER — Telehealth: Payer: Self-pay | Admitting: Family Medicine

## 2020-08-26 DIAGNOSIS — A048 Other specified bacterial intestinal infections: Secondary | ICD-10-CM

## 2020-08-26 MED ORDER — CLARITHROMYCIN 500 MG PO TABS: 500.0000 mg | ORAL_TABLET | Freq: Two times a day (BID) | ORAL | 0 refills | Status: AC

## 2020-08-26 MED ORDER — AMOXICILLIN 500 MG PO CAPS: 500.0000 mg | ORAL_CAPSULE | Freq: Two times a day (BID) | ORAL | 0 refills | Status: AC

## 2020-08-26 MED ORDER — OMEPRAZOLE 20 MG PO CPDR: 20.0000 mg | DELAYED_RELEASE_CAPSULE | Freq: Two times a day (BID) | ORAL | 0 refills | Status: DC

## 2020-08-26 NOTE — Telephone Encounter (Signed)
Jim Little's biopsy showed H. Pylori.   Discussed with Dr. Margarite Gouge need therapy outlined below.   Per Dr. Therisa Doyne---  I would recommend  Amoxicillin 500 mg 2 pills BID for 14 days  Clarithromycin 500 mg BID for 14 days  PPI BID for 14 days  Also, stool for H pylori Ag in 6 weeks post treatment to confirm eradication.   Medications sent for pill pack.  Traci- can we please check with pharmacy about medications and ensure they are mailed? We will need to discuss how to take pill pack with patient (they should come individually wrapped for each administration). He takes 3 pills twice per day for 2 weeks.   Then, 1 month after he completes therapy (around Thanksgiving) he can follow up in our office to discuss how to do stool sample (at home) and check labs?  I know this is a lot. I am including Jim Little as well as will likely need family support.   Thank you for all of your help. Let me know if there are issues!  Thanks, CB

## 2020-08-27 ENCOUNTER — Telehealth: Payer: Self-pay

## 2020-08-27 NOTE — Telephone Encounter (Signed)
Contacted [name] to remind him of appointment tomorrow with urology. Transport will be provided. Also educated him on H.pylori diagnosis and need for treatment with antibiotics per Dr Saul Fordyce instructions.Patient understands that medication will be mailed to his residence and the importance of compliance. I will re-emphasize education when I make a home visit on Wednesday October 6th. Jim Little Aerilyn Slee RN BSN PCCN  [phone]

## 2020-08-29 NOTE — Congregational Nurse Program (Signed)
08/28/20- 10:00am Accompanied [facility] to [facility] for 10:15 appointment. Upon arrival,staff informed that this appointment was cancelled because patient was unreachable by phone to confirm appointment. Appointment rescheduled for 09/08/20. I have requested staff to call me to confirm patients appointments as patient is challenged by language and mobility. [name] Lylith Bebeau RN BSN PCCN [phone]

## 2020-08-31 NOTE — Congregational Nurse Program (Signed)
1200 pm - visited with Mr Papesh at home. He reports to be doing ok s/p EGD and colonoscopy.He has c/o constipation. We have discussed dietary changes to prevent  constipation. I will also discussed this with PCP. Patient educated on H.Pylori diagnosis and need for PPI and antibiotic medication per Dr Saul Fordyce order. Unfortunately he only received omeprazole without the antibiotics. I have educated him on how to take omeprazole. Pharmacy contacted regarding the antibiotics. Pharmacy only received omeprazole prescription. I will reach out to PCP.  [name] Krysteena Stalker RN BSN PCCN [facility]regational nurse  [phone]

## 2020-09-01 ENCOUNTER — Telehealth: Payer: Self-pay | Admitting: Family Medicine

## 2020-09-01 NOTE — Telephone Encounter (Signed)
Called pharmacy. Confirmed they only filled PPI.  Rx for amoxicillin and clarithromycin, verbal orders, given.  They will pack and mail ASAP.  [name], MD  Family Medicine Teaching Service

## 2020-09-06 NOTE — Congregational Nurse Program (Signed)
  Dept: [place] Nurse Program Note  Date of Encounter: 09/06/2020  Past Medical History: Past Medical History:  Diagnosis Date  . BPH (benign prostatic hyperplasia)   . Cirrhosis ([place])   . Hypertension   . Splenomegaly     Encounter Details: Visited client at home. Reviewed his antibiotics to H. Pyroli treatment. Read the  instructions and advised him to take accordingly. He has no other questions or concerns at this time. Jim Server Odilon Cass Rn BSN PCCN [facility] Congregational nurse [phone]

## 2020-09-07 ENCOUNTER — Telehealth: Payer: Self-pay

## 2020-09-07 NOTE — Telephone Encounter (Signed)
Received phone call from [facility] at [place] regarding [facility] referral. Per Delle Reining, they are unable to accept this new referral at this time.   Contact Loren at [phone] with any additional questions.   To PCP and [name], RN  Talbot Grumbling, RN

## 2020-09-08 ENCOUNTER — Ambulatory Visit: Payer: Medicaid Other

## 2020-09-08 NOTE — Chronic Care Management (AMB) (Signed)
  Care Management   Follow Up Note   09/08/2020 Name: Jim Little MRN: [id number] DOB: 1946/03/07  Referred by: [name], MD Reason for referral : Care Coordination ([place])   Jim Little is a 74 y.o. year old male who is a primary care patient of [name], MD. The care management team was consulted for assistance with care management and care coordination needs.    Review of patient status, including review of consultants reports, relevant laboratory and other test results, and collaboration with appropriate care team members and the patient's provider was performed as part of comprehensive patient evaluation and provision of chronic care management services.    SDOH (Social Determinants of Health) assessments performed: No See Care Plan activities for detailed interventions related to [facility])     Advanced Directives: See Care Plan and Vynca application for related entries.   Goals Addressed              This Visit's Progress   .  In need of Home Health (pt-stated)        CARE PLAN ENTRY (see longitudinal plan of care for additional care plan information) CARE PLAN ENTRY (see longitudinal plan of care for additional care plan information)  Current Barriers:  . Care Coordination needs related to [place] in a patient with lower urinary tract symptoms and other cirrhosis of liver - patient will need Nursing and Physical Therapy   Interventions:  . Inter-disciplinary care team collaboration (see longitudinal plan of care) . Collaborated with [name] [name] RN regarding HH -RNCM has worked on this for some time with the help of Bronwen Betters, due to language barrier the following narrative has been included in all routing to companies. - Patient speaks Swahili but has a daughter in Psychologist, forensic that speaks some English.  Her number is [phone].  Also, they have a nurse that helps them from the [place] RN She speaks Swahili and is  willing to interpret for them over the phone.  She is available any day except Thursdays.  Her number is [phone].  She would like to be involved with the initial assessment.  Marland Kitchen RNCM- has sent referrals to [facility] (with help of Jazmin referral coordinator) . [facility], [facility], [facility], [facility], [facility].  All Home health agencies were unable to accommodate the referral due to short staffed or they did not accept the insurance. . Waiting to hear back from [facility] and [facility] .  Patient was not interviewed or contacted during this encounter. Review of patient status, including review of consultants reports, relevant laboratory and other test results, and collaboration with appropriate care team members and the patient's provider was performed as part of comprehensive patient evaluation and provision of chronic care management services.      Please see past updates related to this goal by clicking on the "Past Updates" button in the selected goal          Care manager will wait for response from the two other Home health companies and update Jim [name] RN.  [name] RN, BSN, [facility] Care Management Coordinator [place] Phone: [phone] Fax: [phone]

## 2020-09-08 NOTE — Congregational Nurse Program (Signed)
  Dept: [place] Nurse Program Note  Date of Encounter: 09/08/2020  Past Medical History: Past Medical History:  Diagnosis Date  . BPH (benign prostatic hyperplasia)   . Cirrhosis ([place])   . Hypertension   . Splenomegaly     Encounter Details:  Accompanied patient to [facility] today. Transportation provided to and from this appointment by [employer] transportation services. [name] Reuven Braver Rn BSN PCCn  [facility] Nurse  [phone]

## 2020-09-11 ENCOUNTER — Other Ambulatory Visit: Payer: Self-pay

## 2020-09-11 ENCOUNTER — Telehealth: Payer: Self-pay

## 2020-09-11 NOTE — Telephone Encounter (Signed)
Mr Meddings called because he is unable to go to GI appointment scheduled today to 1:45pm.I have contacted [facility] to reschedule. [name] Rachel Rison RN BSN PCCN  [phone]

## 2020-09-11 NOTE — Telephone Encounter (Signed)
Contacted by [facility] that new prescription has been sent to [place] for antibiotic to treat UTI. I have called Jim Little and informed him of the same.Medication will be mailed to his residence.He verbalized understanding. [name] [name] RN BSN PCCN  [phone]

## 2020-09-13 ENCOUNTER — Ambulatory Visit: Payer: Medicaid Other

## 2020-09-13 ENCOUNTER — Telehealth: Payer: Medicaid Other | Admitting: Family Medicine

## 2020-09-13 NOTE — Chronic Care Management (AMB) (Signed)
  Care Management   Follow Up Note   09/13/2020 Name: Jim Little MRN: [id number] DOB: 12-15-1945  Referred by: [name], MD Reason for referral : No chief complaint on file.   Jim Little is a 74 y.o. year old male who is a primary care patient of [name], MD. The care management team was consulted for assistance with care management and care coordination needs.    Review of patient status, including review of consultants reports, relevant laboratory and other test results, and collaboration with appropriate care team members and the patient's provider was performed as part of comprehensive patient evaluation and provision of chronic care management services.    SDOH (Social Determinants of Health) assessments performed: No See Care Plan activities for detailed interventions related to [facility])     Advanced Directives: See Care Plan and Vynca application for related entries.   Goals Addressed              This Visit's Progress   .  COMPLETED: In need of Home Health (pt-stated)        CARE PLAN ENTRY (see longitudinal plan of care for additional care plan information) CARE PLAN ENTRY (see longitudinal plan of care for additional care plan information)  Current Barriers:  . Care Coordination needs related to [place] in a patient with lower urinary tract symptoms and other cirrhosis of liver - patient will need Nursing and Physical Therapy   Interventions:  . Inter-disciplinary care team collaboration (see longitudinal plan of care) . Collaborated with [name] [name] RN regarding HH -RNCM has worked on this for some time with the help of Bronwen Betters, due to language barrier the following narrative has been included in all routing to companies. - Patient speaks Swahili but has a daughter in Psychologist, forensic that speaks some English.  Her number is [phone].  Also, they have a nurse that helps them from the [facility] [employer] RN She speaks Swahili  and is willing to interpret for them over the phone.  She is available any day except Thursdays.  Her number is [phone].  She would like to be involved with the initial assessment.  Marland Kitchen RNCM- has sent referrals to [facility] (with help of Jazmin referral coordinator) . [facility], [facility], [facility], [facility], [facility].  All Home health agencies were unable to accommodate the referral due to short staffed or they did not accept the insurance. . Waiting to hear back from [facility] and [facility] .  Patient was not interviewed or contacted during this encounter. Review of patient status, including review of consultants reports, relevant laboratory and other test results, and collaboration with appropriate care team members and the patient's provider was performed as part of comprehensive patient evaluation and provision of chronic care management services.   Marland Kitchen RNCM was contacted by Helene Kelp from [place] at home and they weill not be able to staff the referral. . After discussion with Dr Owens Shark we will close the referral at this time due to shortage of resources.   Please see past updates related to this goal by clicking on the "Past Updates" button in the selected goal          Plan:  No further follow up at this time   [place], BSN, [place] Phone: [phone] Fax: [phone]

## 2020-09-27 NOTE — Congregational Nurse Program (Signed)
Home visit done. Reviewed medication with patient.He needs Amlodipine refill.He has ran out of Famotidine but noted  he is also on Omeprazole.He is good with the rest of medication. I will reach out to Dr Owens Shark for refills. [name] Ozie Dimaria RN BSN PCCN  [phone]

## 2020-10-13 ENCOUNTER — Telehealth: Payer: Self-pay

## 2020-10-13 NOTE — Telephone Encounter (Signed)
Contacted Mr Jim Little to inform him that appointment is scheduled for January 10th at 11:30am. He agrees to this date and time.Congregational Nurse office will provide transportation. Honor Loh RN BSn PCCN [facility]  [phone]

## 2020-10-31 ENCOUNTER — Other Ambulatory Visit: Payer: Self-pay | Admitting: Gastroenterology

## 2020-10-31 DIAGNOSIS — K7469 Other cirrhosis of liver: Secondary | ICD-10-CM

## 2020-11-22 NOTE — Congregational Nurse Program (Signed)
  Dept: [phone]   Congregational Nurse Program Note  Date of Encounter: 11/22/2020  Past Medical History: Past Medical History:  Diagnosis Date  . BPH (benign prostatic hyperplasia)   . Cirrhosis (HCC)   . Hypertension   . Splenomegaly     Encounter Details: 11am. Home visit completed. I requested patient to show me his medication bottles. Patient unable to locate his pill bottles. Not sure when he last took his daily medications.Educated him on medication compliance.I offered to call pharmacy for refills but patient would like to look for misplaced medications.I will follow up on in 2 days.Follow up appointment with Urology is due. I will call to schedule appointment.   Arman Bogus RN BSn PCCN  [facility] Nurse [phone]

## 2020-11-27 ENCOUNTER — Telehealth: Payer: Self-pay

## 2020-11-27 ENCOUNTER — Telehealth: Payer: Self-pay | Admitting: Family Medicine

## 2020-11-27 MED ORDER — LACTULOSE 10 G PO PACK: 10.0000 g | PACK | ORAL | 3 refills | Status: DC | PRN

## 2020-11-27 MED ORDER — TAMSULOSIN HCL 0.4 MG PO CAPS
0.4000 mg | ORAL_CAPSULE | Freq: Every day | ORAL | 3 refills | Status: DC
Start: 2020-11-27 — End: 2021-01-31

## 2020-11-27 MED ORDER — AMLODIPINE BESYLATE 5 MG PO TABS
5.0000 mg | ORAL_TABLET | Freq: Every day | ORAL | 3 refills | Status: DC
Start: 2020-11-27 — End: 2021-01-31

## 2020-11-27 NOTE — Telephone Encounter (Signed)
-----   Message from Debbora Presto, RN sent at 11/27/2020 10:46 AM EST ----- Regarding: Refill Hello Dr, Please refill Jim Little`s medications. Also,we can change to [employer] with home delivery to make it easier on him.He has been using dulcolax (extras after colonoscopy) as needed for constipation and are almost finished. You wanted to change this to lactulose for his liver. Thank you always. Nicole Cella

## 2020-11-27 NOTE — Telephone Encounter (Signed)
Contacted Jim Little to inform him his PCP has been contacted for medication refill with home delivery option. Patient always needs transportation assistance and home delivery will make it easier to access medications. I have also called [facility] and scheduled 4 month follow up appointment for 01/05/2021 @ 1315hrs. Jim Little has been informed of this appointment as well as appointment  with Dr Manson Passey scheduled for 12/04/20 @1130am .  RN BSN PCCN  [facility] Nurse [phone]-cell [phone]-office

## 2020-11-27 NOTE — Telephone Encounter (Signed)
Medications to [facility] for packing and shipping.  Terisa Starr, MD  Family Medicine Teaching Service

## 2020-11-28 ENCOUNTER — Telehealth: Payer: Self-pay

## 2020-11-28 NOTE — Telephone Encounter (Signed)
Received a call from [facility] pharmacy to update insurance information and allergies. Same completed. Nicole Cella Milton Sagona RN BSN PCCN [phone]-cell [phone]-office

## 2020-11-28 NOTE — Telephone Encounter (Signed)
Mr Goostree informed htat medication will be delivered to his house. Nicole Cella Nancyann Cotterman RN BSn PCCN  [phone]-cell [phone]-office

## 2020-12-04 ENCOUNTER — Other Ambulatory Visit: Payer: Self-pay

## 2020-12-04 ENCOUNTER — Encounter: Payer: Self-pay | Admitting: Family Medicine

## 2020-12-04 ENCOUNTER — Ambulatory Visit (INDEPENDENT_AMBULATORY_CARE_PROVIDER_SITE_OTHER): Payer: Medicaid Other | Admitting: Family Medicine

## 2020-12-04 ENCOUNTER — Telehealth: Payer: Self-pay

## 2020-12-04 VITALS — BP 160/88 | HR 78 | Wt 143.4 lb

## 2020-12-04 DIAGNOSIS — R300 Dysuria: Secondary | ICD-10-CM

## 2020-12-04 DIAGNOSIS — H269 Unspecified cataract: Secondary | ICD-10-CM | POA: Diagnosis not present

## 2020-12-04 DIAGNOSIS — I10 Essential (primary) hypertension: Secondary | ICD-10-CM

## 2020-12-04 DIAGNOSIS — A048 Other specified bacterial intestinal infections: Secondary | ICD-10-CM | POA: Diagnosis present

## 2020-12-04 DIAGNOSIS — G8929 Other chronic pain: Secondary | ICD-10-CM

## 2020-12-04 DIAGNOSIS — M545 Low back pain, unspecified: Secondary | ICD-10-CM | POA: Diagnosis not present

## 2020-12-04 DIAGNOSIS — H539 Unspecified visual disturbance: Secondary | ICD-10-CM | POA: Diagnosis not present

## 2020-12-04 DIAGNOSIS — K7469 Other cirrhosis of liver: Secondary | ICD-10-CM

## 2020-12-04 DIAGNOSIS — Z23 Encounter for immunization: Secondary | ICD-10-CM

## 2020-12-04 NOTE — Patient Instructions (Addendum)
It was wonderful to see you today.  Please bring ALL of your medications with you to every visit.   Today we talked about:  ---Going back to the Urologist---I will work with Tressia Miners and [name] to find a good time for an appointment  -- Going to the eye doctor---I have placed a referral---I will work with Tressia Miners and [name] to find a good time for an appointment   --  Below is the number of a dentist---please work with your family for an appointment   -- I will work with Tressia Miners to see if we can get a home therapy assessment   --I will call [place] about your medications at home   Thank you for choosing [facility].   Please call [phone] with any questions about today's appointment.  Please be sure to schedule follow up at the front  desk before you leave today.   [name], MD  Family Medicine

## 2020-12-04 NOTE — Assessment & Plan Note (Signed)
Currently only on amlodipine, will call pharmacy. May need to add losartan. BMP today.

## 2020-12-04 NOTE — Telephone Encounter (Signed)
Contacted patient to remind him  of today`s appointment. I asked him to bring his medications to the appointment. He stated that the medications were never delivered.  [place] contacted and stated that the medication package has been waiting to be picked up. I informed pharmacy that the medication was to be delivered to patient`s home. Same will be delivered first thing tomorrow. Address and payments confirmed.  Honor Loh RN BSn PCCN  [facility] Nurse [phone]

## 2020-12-04 NOTE — Assessment & Plan Note (Signed)
Referred back to Ophtho given ongoing symptoms, will coordinate with RN Case Manager and Congregational Nurse regarding this appointment.

## 2020-12-04 NOTE — Progress Notes (Signed)
SUBJECTIVE:   CHIEF COMPLAINT / HPI:   The patient speaks swahili as their primary language.  An interpreter was used for the entire visit.   Jim Little is a pleasant gentleman with history of hepatic cirrhosis, urinary retention, and chronic back pain presenting for follow up. He reports he has not had medications in several days. His concerns today include his vision, his dentition, and his urinary symptoms.  Urinary Symptoms Patient reports overall he is going to bathroom with improvement. Denies dysuria, hematuria, sensation of incomplete emptying. He does report his stream is not as strong.   Problem  Bilateral Cataracts Reports ongoing tearing in eyes. No change in vision or falls at night. No eye pain.    Chronic Back Pain   Patient reports ongoing chronic back pain. He has had some gait instability at home, worse in AM at times. Denies syncope, orthostasis, no recent falls. Wears sneakers at home.    Hepatic Cirrhosis (Hcc)   Patient reports appetite okay. No abdominal pain or swelling.  Follows with Dr. Therisa Doyne and had biopsy this fall that showed only a nodule.      (Hypertension)   Patient reports he has not taken his medication as he ran out. No headache, chest pain, dyspnea. No edema. He does report morning dizziness at times.     History of H Pylori Patient denies epigastric pain. Not taking any PPI at this time. No nausea, vomiting, diarrhea.    PERTINENT  PMH / PSH/Family/Social History : Reviewed and updated.   OBJECTIVE:   BP (!) 160/88   Pulse 78   Wt 143 lb 6.4 oz (65 kg)   SpO2 98%   BMI 23.15 kg/m   Today's weight:  Last Weight  Most recent update: 12/04/2020 11:12 AM   Weight  65 kg (143 lb 6.4 oz)           Review of prior weights: Filed Weights   12/04/20 1111  Weight: 143 lb 6.4 oz (65 kg)   Dentition- multiple missing teeth  Cardiac: Regular rate and rhythm. Normal S1/S2. No murmurs, rubs, or gallops appreciated. Lungs: Clear  bilaterally to ascultation.  Abdomen: Normoactive bowel sounds. No tenderness to deep or light palpation. No rebound or guarding.  No fluid wave  Psych: Pleasant and sad at time MSK Slow gait Spine examined no rash TTP over L SI Reduced flexion and extension Negative FADIR and FABER   MRI June 2021  IMPRESSION: 1. Geographic lesion in the inferior medial aspect of the RIGHT hepatic lobe has some imaging features suggesting focal fatty infiltration. However, atypical enhancement pattern. Recommend follow-up contrast MRI in 3 to 6 months to re-evaluate. 2. No additional lesion liver. 3. Morphologic changes consistent with cirrhosis.    ASSESSMENT/PLAN:   HTN (hypertension) Currently only on amlodipine, will call pharmacy. May need to add losartan. BMP today.   Hepatic cirrhosis (HCC) Hepatic function today. He does not use alcohol. Discontinue ibuprofen (on medication list). Cannot use NSAIDs due to liver disease.  Once H. Pylori returns, will message Dr. Therisa Doyne to see if repeat MRI needed. See above from June.   Bilateral cataracts Referred back to Ophtho given ongoing symptoms, will coordinate with RN Case Manager and Congregational Nurse regarding this appointment.   Chronic back pain Given exam, suspect degenerative disc disease. Also considered compression fracture, SI joint. Chronic without significant change, also consider malignancy if worsening. Marland Kitchen Has not had imaging since 2019, repeat today. Consider MRI in future. Referral  to PT/OT at home. We will see if we can find agency, has been difficult to find one in past.   Poor dentition will work with CM to see if family member can accompany to dentist.   History of H. Pylori, off of PPI and H2 blocker, breath test today (completed as has had difficulty with stool sample). If inadequate sample, will try for stool sample.   HCM Flu shot today, not yet due for COVID booster.   [name], [place]

## 2020-12-04 NOTE — Assessment & Plan Note (Signed)
Hepatic function today. He does not use alcohol. Discontinue ibuprofen (on medication list). Cannot use NSAIDs due to liver disease.

## 2020-12-04 NOTE — Assessment & Plan Note (Signed)
Given exam, suspect degenerative disc disease. Also considered compression fracture, SI joint. Chronic without significant change, also consider malignancy if worsening. Marland Kitchen Has not had imaging since 2019, repeat today. Consider MRI in future. Referral to PT/OT at home. We will see if we can find agency, has been difficult to find one in past.

## 2020-12-05 LAB — CBC
Hematocrit: 38.7 % (ref 37.5–51.0)
Hemoglobin: 14.5 g/dL (ref 13.0–17.7)
MCH: 34.4 pg — ABNORMAL HIGH (ref 26.6–33.0)
MCHC: 37.5 g/dL — ABNORMAL HIGH (ref 31.5–35.7)
MCV: 92 fL (ref 79–97)
Platelets: 178 10*3/uL (ref 150–450)
RBC: 4.22 x10E6/uL (ref 4.14–5.80)
RDW: 12 % (ref 11.6–15.4)
WBC: 4.7 10*3/uL (ref 3.4–10.8)

## 2020-12-05 LAB — COMPREHENSIVE METABOLIC PANEL
ALT: 27 IU/L (ref 0–44)
AST: 53 IU/L — ABNORMAL HIGH (ref 0–40)
Albumin/Globulin Ratio: 1.1 — ABNORMAL LOW (ref 1.2–2.2)
Albumin: 4 g/dL (ref 3.7–4.7)
Alkaline Phosphatase: 154 IU/L — ABNORMAL HIGH (ref 44–121)
BUN/Creatinine Ratio: 6 — ABNORMAL LOW (ref 10–24)
BUN: 6 mg/dL — ABNORMAL LOW (ref 8–27)
Bilirubin Total: 0.7 mg/dL (ref 0.0–1.2)
CO2: 24 mmol/L (ref 20–29)
Calcium: 8.9 mg/dL (ref 8.6–10.2)
Chloride: 105 mmol/L (ref 96–106)
Creatinine, Ser: 1 mg/dL (ref 0.76–1.27)
GFR calc Af Amer: 84 mL/min/{1.73_m2} (ref >59)
GFR calc non Af Amer: 72 mL/min/{1.73_m2} (ref >59)
Globulin, Total: 3.7 g/dL (ref 1.5–4.5)
Glucose: 80 mg/dL (ref 65–99)
Potassium: 3.7 mmol/L (ref 3.5–5.2)
Sodium: 142 mmol/L (ref 134–144)
Total Protein: 7.7 g/dL (ref 6.0–8.5)

## 2020-12-06 ENCOUNTER — Telehealth: Payer: Self-pay

## 2020-12-06 LAB — H. PYLORI BREATH TEST: H pylori Breath Test: POSITIVE — AB

## 2020-12-06 NOTE — Telephone Encounter (Signed)
Patient called stating that his medications were delivered to his house yesterday. He would like assistance understanding how to take the medications. A virtue visit completed via whatapp video. Patient showed me his pill bottles and we reviewed norvasc, flomax and lactulose dosage and frequency All questions answered.  Honor Loh RN BSn PCCN  [facility] Nurse [phone]

## 2020-12-07 ENCOUNTER — Telehealth: Payer: Self-pay | Admitting: Family Medicine

## 2020-12-07 DIAGNOSIS — A048 Other specified bacterial intestinal infections: Secondary | ICD-10-CM

## 2020-12-07 NOTE — Telephone Encounter (Signed)
H. Pylori returned positive. Congregational RN suspects non-adherence.   Will treat with below, discussing with RN Case Managers re: best way to ensure adherence.  1. Metronidazole 250 mg four times a day for 14 days  2. Bismuth subsalicylate 276 mg 2 tabs four times a day for 14 days(available OTC as peptobismol)  3. Doxycycline 100 mg twice a day for 14 days(tetracycline is not available).  4. Pantoprazole 40 mg twice a day for 14 days.   [name], MD  Family Medicine Teaching Service

## 2020-12-08 MED ORDER — PANTOPRAZOLE SODIUM 40 MG PO TBEC: 40.0000 mg | DELAYED_RELEASE_TABLET | Freq: Two times a day (BID) | ORAL | 0 refills | Status: DC

## 2020-12-08 MED ORDER — BISMUTH SUBSALICYLATE 262 MG PO CHEW: 524.0000 mg | CHEWABLE_TABLET | Freq: Four times a day (QID) | ORAL | 0 refills | Status: AC

## 2020-12-08 MED ORDER — METRONIDAZOLE 250 MG PO TABS: 250.0000 mg | ORAL_TABLET | Freq: Four times a day (QID) | ORAL | 0 refills | Status: AC

## 2020-12-08 MED ORDER — DOXYCYCLINE HYCLATE 100 MG PO TABS: 100.0000 mg | ORAL_TABLET | Freq: Two times a day (BID) | ORAL | 0 refills | Status: AC

## 2020-12-08 NOTE — Telephone Encounter (Signed)
H pylori therapy to [facility] [place] [facility].  [name], MD  Family Medicine Teaching Service

## 2020-12-08 NOTE — Telephone Encounter (Signed)
Called [facility]--they will order bismuth and deliver to patient's home.   [name], MD  Family Medicine Teaching Service

## 2020-12-08 NOTE — Addendum Note (Signed)
Addended by: Owens Shark, Sorcha Rotunno on: 12/08/2020 03:15 PM   Modules accepted: Orders

## 2020-12-09 NOTE — Congregational Nurse Program (Signed)
  Dept: [place] Nurse Program Note  Date of Encounter: 12/09/2020  Past Medical History: Past Medical History:  Diagnosis Date  . BPH (benign prostatic hyperplasia)   . Cirrhosis ([place])   . Hypertension   . Splenomegaly     Encounter Details: 12/08/20 1300hrs. Home visit completed. Medication bottles reviewed with patient. Education completed with teach back. Mr Eisenhour able to verbalized how and when to take norvasc,flomax and lactulose. I have informed him that  H.Pylori medication will be delivered. He is to call me once they are delivered to his home.He verbalized understanding.  Honor Loh RN BSn PCCN  [facility]ational Nurse [phone]

## 2020-12-20 ENCOUNTER — Telehealth: Payer: Self-pay

## 2020-12-20 NOTE — Telephone Encounter (Signed)
I have contacted Mr Jim Little, he stated that H.Pylori medication has not been delivered to his residence. [facility]  contacted. Pharmacy staff stated that "did not know it was supposed to be delivered". I assisted Mr Jim Little to pay for these  Medications using his credit card and same will be delivered tomorrow.  Honor Loh RN BSn PCCN  [facility]ational Nurse [phone]

## 2020-12-25 ENCOUNTER — Telehealth: Payer: Self-pay

## 2020-12-25 NOTE — Telephone Encounter (Signed)
Contacted [facility] and MRI appointment scheduled for Friday February 18th @1 :00pm. Address [place]. Patient informed to be NPO for 4 hours prior to procedure and that IV will be needed to contrast administration.Patient verbalized understanding.  Honor Loh RN BSn PCCN  [facility]regational Nurse [phone]

## 2020-12-25 NOTE — Congregational Nurse Program (Signed)
  Dept: [place] Nurse Program Note  Date of Encounter: 12/22/20  Past Medical History: Past Medical History:  Diagnosis Date  . BPH (benign prostatic hyperplasia)   . Cirrhosis ([place])   . Hypertension   . Splenomegaly     Encounter Details:  12/22/20 at 1300hrs  Home visit completed. Patient educated on H.pylori medication. Patient educated on how and when to take the medications.He provided teach back.All questions answered.  Honor Loh RN BSn PCCN  [facility]regational Nurse [phone]

## 2020-12-27 ENCOUNTER — Telehealth: Payer: Self-pay

## 2020-12-27 NOTE — Telephone Encounter (Signed)
Spoke to patient extensively about the importance of medication compliance. Patient states that he would like to discontinue the Norvasc and the Flomax until his quad therapy for H. Pylori has been completed. We discussed the importance of continuing the Flomax to manage his BPH and patient was reminded of his upcoming Urology visit. The importance of taking the Norvasc as prescribed for blood pressure management was also discussed. Patient indicated understanding.   Patient has MRI scheduled for 01/12/21 at 1:30pm. Will follow up with reminder about being NPO 4 hrs prior and the need for IV contrast.   Honor Loh RN BSn [facility]  [facility]ational Nurse [phone]-office

## 2021-01-01 ENCOUNTER — Telehealth: Payer: Self-pay | Admitting: Family Medicine

## 2021-01-01 DIAGNOSIS — Z599 Problem related to housing and economic circumstances, unspecified: Secondary | ICD-10-CM

## 2021-01-01 DIAGNOSIS — Z59819 Housing instability, housed unspecified: Secondary | ICD-10-CM

## 2021-01-01 NOTE — Telephone Encounter (Signed)
Received note that family is being evicted from home March 10th.  Given chronic illness (of multiple family members) this could significantly affect health. Referral to Chronic Care Management for housing.  [name], MD  Family Medicine Teaching Service

## 2021-01-04 ENCOUNTER — Encounter: Payer: Self-pay | Admitting: Family Medicine

## 2021-01-08 ENCOUNTER — Telehealth: Payer: Self-pay

## 2021-01-08 NOTE — Congregational Nurse Program (Signed)
  Dept: [place] Nurse Program Note  Date of Encounter: 01/05/21  Past Medical History: Past Medical History:  Diagnosis Date  . BPH (benign prostatic hyperplasia)   . Cirrhosis ([place])   . Hypertension   . Splenomegaly     Encounter Details: Accompanied patient to urology appointment to assist with translation.Blood pressure was elevated. Patient did not take blood pressure medication today. 316 ml urine in bladder after bladder scan. Patient did not feel the urge to void. Advised to continue taking flomax as ordered.  Family is having difficulties finding a home after receiving eviction notice.Patient provided with letter from primary doctor requesting landlord for more time.  Honor Loh RN BSn PCCN  [facility]regational Nurse [phone]

## 2021-01-08 NOTE — Telephone Encounter (Signed)
   Telephone encounter was:  Unsuccessful.  01/08/2021 Name: Jim Little MRN: [id number] DOB: 1946-05-31  Unsuccessful outbound call made today to assist with:  Housing and eviction mediation information  Outreach Attempt:  1st Attempt  Left message on voicemail with the assistance of Swahili interpreter that housing information has been forwarded to  [place] and she will give to the patient.  [name], AAS Paralegal, [place] . Embedded Care Coordination [facility]  Care Management  [address], [place] [address] [email or web address]  ?? [phone]   [email or web address]

## 2021-01-11 ENCOUNTER — Telehealth: Payer: Self-pay

## 2021-01-11 NOTE — Telephone Encounter (Signed)
Called patient to remind him of MRI appointment tomorrow and that he needs to be NPO for 4 hours prior to MRI. Advised not to eat or drink after 0900 am.  Honor Loh RN BSn [facility]  [facility]regational Nurse [phone]

## 2021-01-12 ENCOUNTER — Ambulatory Visit
Admission: RE | Admit: 2021-01-12 | Discharge: 2021-01-12 | Disposition: A | Discharge status: Home / Self Care | Payer: Medicaid Other | Source: Ambulatory Visit | Attending: Gastroenterology | Admitting: Gastroenterology

## 2021-01-12 ENCOUNTER — Other Ambulatory Visit: Payer: Self-pay

## 2021-01-12 ENCOUNTER — Telehealth: Payer: Self-pay | Admitting: Family Medicine

## 2021-01-12 DIAGNOSIS — K7469 Other cirrhosis of liver: Secondary | ICD-10-CM

## 2021-01-12 MED ORDER — GADOBENATE DIMEGLUMINE 529 MG/ML IV SOLN
14.0000 mL | Freq: Once | INTRAVENOUS | Status: AC | PRN
  Administered 2021-01-12: 14 mL via INTRAVENOUS

## 2021-01-12 NOTE — Telephone Encounter (Signed)
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  Called Congegrational RN [name]--spoke with patient. He endorses constipation. No bloating, nausea, vomiting. He is good spirits.  She reviewed return precautions for weekend.  Will discuss with [facility], covering for Dr. Therisa Doyne. Paged on call, no response. Will try again on 2/19.   [name], MD  Family Medicine Teaching Service

## 2021-01-13 ENCOUNTER — Telehealth: Payer: Self-pay | Admitting: Family Medicine

## 2021-01-13 ENCOUNTER — Inpatient Hospital Stay (HOSPITAL_COMMUNITY)
Admission: AD | Admit: 2021-01-13 | Discharge: 2021-01-16 | DRG: 442 | Disposition: A | Discharge status: Home / Self Care | Payer: Medicaid Other | Source: Ambulatory Visit | Attending: Family Medicine | Admitting: Family Medicine

## 2021-01-13 ENCOUNTER — Telehealth: Payer: Self-pay

## 2021-01-13 DIAGNOSIS — R161 Splenomegaly, not elsewhere classified: Secondary | ICD-10-CM | POA: Diagnosis present

## 2021-01-13 DIAGNOSIS — D6959 Other secondary thrombocytopenia: Secondary | ICD-10-CM | POA: Diagnosis present

## 2021-01-13 DIAGNOSIS — G8929 Other chronic pain: Secondary | ICD-10-CM | POA: Diagnosis present

## 2021-01-13 DIAGNOSIS — Z8719 Personal history of other diseases of the digestive system: Secondary | ICD-10-CM

## 2021-01-13 DIAGNOSIS — N401 Enlarged prostate with lower urinary tract symptoms: Secondary | ICD-10-CM | POA: Diagnosis present

## 2021-01-13 DIAGNOSIS — I81 Portal vein thrombosis: Secondary | ICD-10-CM | POA: Diagnosis present

## 2021-01-13 DIAGNOSIS — K7460 Unspecified cirrhosis of liver: Secondary | ICD-10-CM | POA: Diagnosis present

## 2021-01-13 DIAGNOSIS — C220 Liver cell carcinoma: Secondary | ICD-10-CM | POA: Diagnosis present

## 2021-01-13 DIAGNOSIS — I10 Essential (primary) hypertension: Secondary | ICD-10-CM | POA: Diagnosis present

## 2021-01-13 DIAGNOSIS — B9681 Helicobacter pylori [H. pylori] as the cause of diseases classified elsewhere: Secondary | ICD-10-CM | POA: Diagnosis present

## 2021-01-13 DIAGNOSIS — Z20822 Contact with and (suspected) exposure to covid-19: Secondary | ICD-10-CM | POA: Diagnosis present

## 2021-01-13 DIAGNOSIS — D638 Anemia in other chronic diseases classified elsewhere: Secondary | ICD-10-CM | POA: Diagnosis present

## 2021-01-13 DIAGNOSIS — H04123 Dry eye syndrome of bilateral lacrimal glands: Secondary | ICD-10-CM | POA: Diagnosis present

## 2021-01-13 DIAGNOSIS — R338 Other retention of urine: Secondary | ICD-10-CM | POA: Diagnosis present

## 2021-01-13 DIAGNOSIS — Z79899 Other long term (current) drug therapy: Secondary | ICD-10-CM

## 2021-01-13 DIAGNOSIS — K0889 Other specified disorders of teeth and supporting structures: Secondary | ICD-10-CM | POA: Diagnosis present

## 2021-01-13 DIAGNOSIS — M549 Dorsalgia, unspecified: Secondary | ICD-10-CM | POA: Diagnosis present

## 2021-01-13 DIAGNOSIS — R160 Hepatomegaly, not elsewhere classified: Secondary | ICD-10-CM

## 2021-01-13 DIAGNOSIS — I820 Budd-Chiari syndrome: Secondary | ICD-10-CM | POA: Diagnosis not present

## 2021-01-13 DIAGNOSIS — A048 Other specified bacterial intestinal infections: Secondary | ICD-10-CM | POA: Diagnosis not present

## 2021-01-13 DIAGNOSIS — K5900 Constipation, unspecified: Secondary | ICD-10-CM | POA: Diagnosis present

## 2021-01-13 DIAGNOSIS — Z87898 Personal history of other specified conditions: Secondary | ICD-10-CM | POA: Diagnosis not present

## 2021-01-13 LAB — HEPATIC FUNCTION PANEL
ALT: 20 U/L (ref 0–44)
AST: 62 U/L — ABNORMAL HIGH (ref 15–41)
Albumin: 3.2 g/dL — ABNORMAL LOW (ref 3.5–5.0)
Alkaline Phosphatase: 110 U/L (ref 38–126)
Bilirubin, Direct: 0.2 mg/dL (ref 0.0–0.2)
Indirect Bilirubin: 0.5 mg/dL (ref 0.3–0.9)
Total Bilirubin: 0.7 mg/dL (ref 0.3–1.2)
Total Protein: 7.4 g/dL (ref 6.5–8.1)

## 2021-01-13 LAB — CBC WITH DIFFERENTIAL/PLATELET
Abs Immature Granulocytes: 0.01 10*3/uL (ref 0.00–0.07)
Basophils Absolute: 0 10*3/uL (ref 0.0–0.1)
Basophils Relative: 0 %
Eosinophils Absolute: 0.2 10*3/uL (ref 0.0–0.5)
Eosinophils Relative: 5 %
HCT: 36.7 % — ABNORMAL LOW (ref 39.0–52.0)
Hemoglobin: 12.3 g/dL — ABNORMAL LOW (ref 13.0–17.0)
Immature Granulocytes: 0 %
Lymphocytes Relative: 28 %
Lymphs Abs: 1.3 10*3/uL (ref 0.7–4.0)
MCH: 33 pg (ref 26.0–34.0)
MCHC: 33.5 g/dL (ref 30.0–36.0)
MCV: 98.4 fL (ref 80.0–100.0)
Monocytes Absolute: 0.6 10*3/uL (ref 0.1–1.0)
Monocytes Relative: 13 %
Neutro Abs: 2.6 10*3/uL (ref 1.7–7.7)
Neutrophils Relative %: 54 %
Platelets: 154 10*3/uL (ref 150–400)
RBC: 3.73 MIL/uL — ABNORMAL LOW (ref 4.22–5.81)
RDW: 13.6 % (ref 11.5–15.5)
WBC: 4.9 10*3/uL (ref 4.0–10.5)
nRBC: 0 % (ref 0.0–0.2)

## 2021-01-13 LAB — BASIC METABOLIC PANEL
Anion gap: 7 (ref 5–15)
BUN: 7 mg/dL — ABNORMAL LOW (ref 8–23)
CO2: 28 mmol/L (ref 22–32)
Calcium: 8.9 mg/dL (ref 8.9–10.3)
Chloride: 103 mmol/L (ref 98–111)
Creatinine, Ser: 0.95 mg/dL (ref 0.61–1.24)
GFR, Estimated: >60 mL/min (ref >60)
Glucose, Bld: 136 mg/dL — ABNORMAL HIGH (ref 70–99)
Potassium: 3.8 mmol/L (ref 3.5–5.1)
Sodium: 138 mmol/L (ref 135–145)

## 2021-01-13 LAB — PROTIME-INR
INR: 1.3 — ABNORMAL HIGH (ref 0.8–1.2)
Prothrombin Time: 15.5 seconds — ABNORMAL HIGH (ref 11.4–15.2)

## 2021-01-13 MED ORDER — FOLIC ACID 1 MG PO TABS
1.0000 mg | ORAL_TABLET | Freq: Every day | ORAL | Status: DC
  Administered 2021-01-14 – 2021-01-16 (×3): 1 mg via ORAL
  Filled 2021-01-13 (×3): qty 1

## 2021-01-13 MED ORDER — AMLODIPINE BESYLATE 5 MG PO TABS
5.0000 mg | ORAL_TABLET | Freq: Every day | ORAL | Status: DC
  Administered 2021-01-14 – 2021-01-16 (×3): 5 mg via ORAL
  Filled 2021-01-13 (×3): qty 1

## 2021-01-13 MED ORDER — HEPARIN (PORCINE) 25000 UT/250ML-% IV SOLN
1050.0000 [IU]/h | INTRAVENOUS | Status: DC
  Administered 2021-01-14 (×2): 1000 [IU]/h via INTRAVENOUS
  Filled 2021-01-13 (×2): qty 250

## 2021-01-13 MED ORDER — HEPARIN BOLUS VIA INFUSION
4000.0000 [IU] | Freq: Once | INTRAVENOUS | Status: AC
  Administered 2021-01-14: 4000 [IU] via INTRAVENOUS
  Filled 2021-01-13: qty 4000

## 2021-01-13 MED ORDER — TAMSULOSIN HCL 0.4 MG PO CAPS
0.4000 mg | ORAL_CAPSULE | Freq: Every day | ORAL | Status: DC
  Administered 2021-01-14 – 2021-01-16 (×3): 0.4 mg via ORAL
  Filled 2021-01-13 (×3): qty 1

## 2021-01-13 NOTE — H&P (Signed)
[facility] Admission History and Physical Service Pager: [phone]  Patient name: Jim Little Medical record number: [id number] Date of birth: Dec 31, 1945 Age: 75 y.o. Gender: male  Primary Care Provider: [name], MD Consultants: GI Code Status: Full Preferred Emergency Contact: Nas[place], wife or Ebeli, son  Chief Complaint: portal vein thrombosis  Assessment and Plan: Jim Little is a 75 y.o. male presenting for direct admission for anticoagulation in setting of portal vein thrombosis. PMH is significant for hepatic mass, cirrhosis, HTN, h/o H. Pylori, urinary retention, thrombocytopenia, and b/l cataracts.   Portal Vein Thrombosis  Hepatic Cirrhosis  Hepatic Mass Patient with MRI of liver from 01/12/21 that found 7x7 cm left lobe liver mass c/w hepatocellular carcinoma as well as extensive portal vein thrombosis. Patient has h/o cirrhosis and is at risk for bleeding, as well as he is a refugee and due to barriers with language and access, decision made for direct admission to start anticoagulation. Patient hypertensive on admission (see below), otherwise VSS. Will obtain CBC, CMP, INR, AFP and if stable, will start heparin drip tonight. Will obtain hepatic doppler. Plan for Dr. Therisa Doyne from GI to see patient on Monday, 01/15/21. Patient has a h/o delirium, recommend unrestricted visitors.  - Admit to [facility], attending Dr. Owens Shark - CBC, CMP, INR, AFP - f/u hepatic doppler - GI consult, appreciate recommendations - PPI for GI ppx - Heart healthy diet - Heparin drip to start once labs return - PT/OT  HTN Stable, chronic. Patient takes amlodipine 5 mg daily. BP elevated to 180/86, presently asymptomatic. Will continue to monitor and treat if patient becomes symptomatic or BP >220/110. - continue amlodipine 5 mg  H/o Urinary Retention Patient has h/o urinary retention. Will continue home flomax and have scheduled bladder scans. - q4h bladder scans  H.  Pylori Patient tested positive on 12/16/20, started triple therapy x2 weeks, but did not complete therapy as instructed. Will readdress outpatient.   H/o Thrombocytopenia Most recent labs WNL. Awaiting CBC tonight. Will continue to monitor. -CBC  FEN/GI: Heart healthy diet, PPI Prophylaxis: to start heparin once admission labs return, see above  Disposition: admitted to inpatient for anticoagulation and medical work up  History of Present Illness:  Jim Little is a 75 y.o. male w/ h/o cirrhosis who had a liver MRI on 01/12/21 that showed a 7x7 cm mass c/w hepatocellular carcinoma and extensive portal vein thrombosis. Patient is a refugee from [facility], arrived in 2019. He is not yet aware of cancer diagnosis per notes.   Review Of Systems: Per HPI with the following additions: patient   Review of Systems  Constitutional: Negative for activity change, appetite change and fever.  HENT: Positive for dental problem. Negative for drooling.   Respiratory: Negative for shortness of breath.   Gastrointestinal: Negative for abdominal distention, abdominal pain, blood in stool, constipation, diarrhea, nausea and vomiting.  Neurological: Negative for dizziness, weakness and headaches.     Patient Active Problem List   Diagnosis Date Noted  . Portal vein thrombosis 01/13/2021  . Urinary retention 05/11/2020  . Thrombocytopenia ([place]) 05/11/2020  . Bilateral cataracts 07/15/2018  . Refugee health examination 04/16/2018  . Splenomegaly 04/16/2018  . Chronic back pain 04/16/2018  . Hepatic cirrhosis ([place]) 04/16/2018  . HTN (hypertension) 04/16/2018    Past Medical History: Past Medical History:  Diagnosis Date  . BPH (benign prostatic hyperplasia)   . Cirrhosis ([place])   . Hypertension   . Splenomegaly     Past Surgical History:  Past Surgical History:  Procedure Laterality Date  . BIOPSY  08/22/2020   Procedure: BIOPSY;  Surgeon: Ronnette Juniper, MD;  Location: [facility];  Service:  Gastroenterology;;  . COLONOSCOPY WITH PROPOFOL N/A 08/22/2020   Procedure: COLONOSCOPY WITH PROPOFOL;  Surgeon: Ronnette Juniper, MD;  Location: [facility];  Service: Gastroenterology;  Laterality: N/A;  . ESOPHAGOGASTRODUODENOSCOPY (EGD) WITH PROPOFOL N/A 08/22/2020   Procedure: ESOPHAGOGASTRODUODENOSCOPY (EGD) WITH PROPOFOL;  Surgeon: Ronnette Juniper, MD;  Location: [facility];  Service: Gastroenterology;  Laterality: N/A;  . UPPER GASTROINTESTINAL ENDOSCOPY  03/2018   Per overseas records --candidiasis plus esophageal varices grade 2.  Treated with fluconazole and status post esophageal banding.  Marland Kitchen US ECHOCARDIOGRAPHY  10/2016   per overseas records from [place] - LVEF 68%; mildly calcified aortic valve, normal function otherwise    Social History: Social History   Tobacco Use  . Smoking status: Never Smoker  . Smokeless tobacco: Never Used  Substance Use Topics  . Alcohol use: Never   Additional social history: Patient speaks Swahili, son Heide Scales is primary emergency contact.  Please also refer to relevant sections of EMR.  Family History: No family history on file.  Allergies and Medications: No Known Allergies No current facility-administered medications on file prior to encounter.   Current Outpatient Medications on File Prior to Encounter  Medication Sig Dispense Refill  . acetaminophen (TYLENOL) 500 MG tablet Take 500 mg by mouth 2 (two) times daily.    Marland Kitchen amLODipine (NORVASC) 5 MG tablet Take 1 tablet (5 mg total) by mouth daily. 90 tablet 3  . azelastine (OPTIVAR) 0.05 % ophthalmic solution Place 1 drop into both eyes 2 (two) times daily. 6 mL 12  . famotidine (PEPCID) 20 MG tablet Take 1 tablet (20 mg total) by mouth daily. 90 tablet 3  . fluticasone (FLONASE) 50 MCG/ACT nasal spray Place 2 sprays into both nostrils daily. 16 g 6  . folic acid (FOLVITE) 1 MG tablet Take 1 tablet (1 mg total) by mouth daily. 90 tablet 3  . lactulose (CEPHULAC) 10 g packet Take 1 packet (10 g  total) by mouth as needed (constipation). Dissolve in 8 ounces of water 30 each 3  . Olopatadine HCl 0.2 % SOLN Apply 1 drop to eye daily. 2.5 mL 3  . pantoprazole (PROTONIX) 40 MG tablet Take 1 tablet (40 mg total) by mouth 2 (two) times daily for 14 days. 28 tablet 0  . tamsulosin (FLOMAX) 0.4 MG CAPS capsule Take 1 capsule (0.4 mg total) by mouth daily. Please pack and mail to home 90 capsule 3    Objective: There were no vitals taken for this visit. Exam: General: age-appropriate African man, resting comfortably in bed, NAD, WNWD Eyes: anicteric sclerae ENTM: MMM, clear oropharhynx Neck: supple, no LAD Cardiovascular: RRR, no m/r/g, 2+ radial pulses Respiratory: CTAB, no increased WOB, no w/r/r Gastrointestinal: soft, ND, NT, hepatosplenomegaly present MSK: warm, dry Derm: no rashes Neuro: Alert and at baseline Psych: pleasant, appropriate, full affect  Labs and Imaging: CBC BMET  No results for input(s): WBC, HGB, HCT, PLT in the last 168 hours. No results for input(s): NA, K, CL, CO2, BUN, CREATININE, GLUCOSE, CALCIUM in the last 168 hours.    MR ABDOMEN WWO CONTRAST  Result Date: 01/12/2021 CLINICAL DATA:  Cirrhosis. EXAM: MRI ABDOMEN WITHOUT AND WITH CONTRAST TECHNIQUE: Multiplanar multisequence MR imaging of the abdomen was performed both before and after the administration of intravenous contrast. CONTRAST:  51mL MULTIHANCE GADOBENATE DIMEGLUMINE 529 MG/ML IV SOLN COMPARISON:  MRI 06/11/2020 FINDINGS: Examination is limited by significant breathing motion artifact. Lower chest: Mild cardiac enlargement. No pericardial effusion. No obvious pulmonary lesions or pleural effusions. Hepatobiliary: Advanced cirrhotic changes involving the liver. 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 I do not see  any definite left hepatic lobe lesions. This area is also diffusion positive. The gallbladder is unremarkable.  No common bile duct dilatation. Pancreas: No mass, inflammation or ductal dilatation. Moderate pancreatic atrophy. Spleen: Borderline splenomegaly. Splenic length is 14 cm. No focal lesions. Adrenals/Urinary Tract: The adrenal glands and kidneys are grossly normal. No worrisome renal lesions. Stomach/Bowel: Stomach, duodenum, visualized small bowel and visualized colon are grossly normal. Vascular/Lymphatic: The aorta and branch vessels are patent. No obvious adenopathy. Other:  No ascites or abdominal wall hernia. Musculoskeletal: No significant bony findings. IMPRESSION: 1. Advanced cirrhotic changes involving the liver. There is a large infiltrating mass versus numerous confluent masses involving the right hepatic lobe and consistent with hepatocellular carcinoma. Associated thrombosed middle and portal veins. No definite left hepatic lobe lesions. LI-RADS 5. 2. Borderline splenomegaly. 3. No ascites or abdominal wall hernia. Electronically Signed   By: Marijo Sanes M.D.   On: 01/12/2021 16:36   Gladys Damme, MD 01/13/2021, 8:22 PM PGY-2, [place] Intern pager: [phone], text pages welcome

## 2021-01-13 NOTE — Telephone Encounter (Signed)
Contacted family and spoke with patient`s son Ebeli. Patient will need assistance with transportation for admission to hospital. I confirmed that [facility] will be able to provide a ride. He will need assistance once he arrives to the main entrance due to language barrier. He has my phone number to call me for assistance.  Honor Loh RN BSn PCCN  [facility] Nurse [phone]

## 2021-01-13 NOTE — Telephone Encounter (Signed)
Called with interpreter, patient on the way to the hospital with son (sounds like they may still be at home right now) but leaving soon.  [name], MD  Family Medicine Teaching Service

## 2021-01-13 NOTE — Telephone Encounter (Signed)
Paged GI this AM, spoke with Dr. Watt Climes who works with Dr. Therisa Doyne. Dr. Therisa Doyne will be covering weekend.  Reviewed MRI results, Dr. Watt Climes reviewed in office AFP which was 1500. Also discussed with Dr. Irene Limbo.  Dr. Irene Limbo recommends anticoagulation. Given patient's underlying cirrhosis, age, and barriers to care will call patient and recommend direct admission.   Called with Swahili interpreter. Discussed that he has blood clot requiring treatment, Did not discuss of mass over phone--will do in hospital. Recommended admission given risk of bleeding. He is amenable to direct admission. They are having difficulty with transportation.   Spoke with RN [name]. Will arrange transport with son Ebeli. Called bed sitter direct admission.    Plan on admission - Will need hepatic dopplers - CMP, AFP, INR, and CBC with differential - Heparin drip once labs return  (no bolus)  - PPI (PO) for GI prophylaxis - Bladder scans (history of retention) - Unrestricted visitors (has history of delirium)  - Will discuss case on Monday with Dr. Therisa Doyne [facility])   [name], MD  Family Medicine Teaching Service

## 2021-01-13 NOTE — Telephone Encounter (Signed)
Patient will have bed on 2W24. He is presenting to ER front desk around 6 PM and should be sent to 2W24. He will be with son Jim Little). Primary language Swahili.  Please page FM resident on call [phone] or admitting physician if any issues or when he arrives.  [name], MD  Family Medicine Teaching Service  [phone]

## 2021-01-13 NOTE — Progress Notes (Signed)
ANTICOAGULATION CONSULT NOTE - Initial Consult  Pharmacy Consult for Heparin Indication: portal vein thrombosis  No Known Allergies  Patient Measurements:    Ht 66 in Wt 65 kg  Vital Signs: Temp: 97.6 F (36.4 C) (02/19 1954) Temp Source: Oral (02/19 1954) BP: 180/86 (02/19 1954) Pulse Rate: 64 (02/19 1954)  Labs: Recent Labs    01/13/21 2146  HGB 12.3*  HCT 36.7*  PLT 154  LABPROT 15.5*  INR 1.3*  CREATININE 0.95    CrCl cannot be calculated (Unknown ideal weight.).   Medical History: Past Medical History:  Diagnosis Date  . BPH (benign prostatic hyperplasia)   . Cirrhosis ([place])   . Hypertension   . Splenomegaly     Medications:  Awaiting home med rec  Assessment: 75 y.o. M with MRI of liver from 01/12/21 that found 7x7 cm left lobe liver mass c/w hepatocellular carcinoma as well as extensive portal vein thrombosis. To begin heparin per pharmacy. CBC ok on admission. No AC PTA.  Goal of Therapy:  Heparin level 0.3-0.7 units/ml Monitor platelets by anticoagulation protocol: Yes   Plan:  Heparin IV bolus 4000 units Heparin gtt at 1000 units/hr Will f/u heparin level in 68 hours Daily heparin level and CBC  Sherlon Handing, PharmD, BCPS Please see amion for complete clinical pharmacist phone list 01/13/2021,11:38 PM

## 2021-01-14 ENCOUNTER — Encounter (HOSPITAL_COMMUNITY): Payer: Self-pay | Admitting: Family Medicine

## 2021-01-14 ENCOUNTER — Inpatient Hospital Stay (HOSPITAL_COMMUNITY): Payer: Medicaid Other

## 2021-01-14 ENCOUNTER — Other Ambulatory Visit: Payer: Self-pay

## 2021-01-14 ENCOUNTER — Telehealth: Payer: Self-pay

## 2021-01-14 DIAGNOSIS — I10 Essential (primary) hypertension: Secondary | ICD-10-CM | POA: Diagnosis not present

## 2021-01-14 DIAGNOSIS — I81 Portal vein thrombosis: Secondary | ICD-10-CM | POA: Diagnosis not present

## 2021-01-14 DIAGNOSIS — A048 Other specified bacterial intestinal infections: Secondary | ICD-10-CM | POA: Diagnosis not present

## 2021-01-14 DIAGNOSIS — Z87898 Personal history of other specified conditions: Secondary | ICD-10-CM

## 2021-01-14 LAB — COMPREHENSIVE METABOLIC PANEL
ALT: 21 U/L (ref 0–44)
AST: 57 U/L — ABNORMAL HIGH (ref 15–41)
Albumin: 3.2 g/dL — ABNORMAL LOW (ref 3.5–5.0)
Alkaline Phosphatase: 107 U/L (ref 38–126)
Anion gap: 6 (ref 5–15)
BUN: 6 mg/dL — ABNORMAL LOW (ref 8–23)
CO2: 30 mmol/L (ref 22–32)
Calcium: 9.3 mg/dL (ref 8.9–10.3)
Chloride: 103 mmol/L (ref 98–111)
Creatinine, Ser: 0.88 mg/dL (ref 0.61–1.24)
GFR, Estimated: >60 mL/min (ref >60)
Glucose, Bld: 100 mg/dL — ABNORMAL HIGH (ref 70–99)
Potassium: 4.2 mmol/L (ref 3.5–5.1)
Sodium: 139 mmol/L (ref 135–145)
Total Bilirubin: 0.5 mg/dL (ref 0.3–1.2)
Total Protein: 7.5 g/dL (ref 6.5–8.1)

## 2021-01-14 LAB — CBC
HCT: 37.1 % — ABNORMAL LOW (ref 39.0–52.0)
Hemoglobin: 12.8 g/dL — ABNORMAL LOW (ref 13.0–17.0)
MCH: 33.6 pg (ref 26.0–34.0)
MCHC: 34.5 g/dL (ref 30.0–36.0)
MCV: 97.4 fL (ref 80.0–100.0)
Platelets: 153 10*3/uL (ref 150–400)
RBC: 3.81 MIL/uL — ABNORMAL LOW (ref 4.22–5.81)
RDW: 13.9 % (ref 11.5–15.5)
WBC: 4.3 10*3/uL (ref 4.0–10.5)
nRBC: 0 % (ref 0.0–0.2)

## 2021-01-14 LAB — HEPARIN LEVEL (UNFRACTIONATED): Heparin Unfractionated: 0.55 IU/mL (ref 0.30–0.70)

## 2021-01-14 MED ORDER — PANTOPRAZOLE SODIUM 40 MG PO TBEC
40.0000 mg | DELAYED_RELEASE_TABLET | Freq: Every day | ORAL | Status: DC
  Administered 2021-01-14 – 2021-01-16 (×3): 40 mg via ORAL
  Filled 2021-01-14 (×3): qty 1

## 2021-01-14 MED ORDER — POLYETHYLENE GLYCOL 3350 17 G PO PACK
17.0000 g | PACK | Freq: Two times a day (BID) | ORAL | Status: DC
  Administered 2021-01-14 – 2021-01-16 (×5): 17 g via ORAL
  Filled 2021-01-14 (×5): qty 1

## 2021-01-14 MED ORDER — DOCUSATE SODIUM 100 MG PO CAPS
100.0000 mg | ORAL_CAPSULE | Freq: Every day | ORAL | Status: DC
  Administered 2021-01-14 – 2021-01-15 (×2): 100 mg via ORAL
  Filled 2021-01-14 (×2): qty 1

## 2021-01-14 NOTE — Progress Notes (Signed)
PT Cancellation Note  Patient Details Name: Jim Little MRN: [id number] DOB: 1946-08-12   Cancelled Treatment:    Reason Eval/Treat Not Completed: Medical issues which prohibited therapy. Pt with portal vein thrombosis and initiated heparin at 23:12 on 2/19. For safety reasons protocol is to hold PT until > 24 hours after start of therapeutic heparin. Will follow-up tomorrow.  Moishe Spice, PT, DPT Acute Rehabilitation Services  Pager: [phone] Office: [place] 01/14/2021, 9:10 AM

## 2021-01-14 NOTE — Telephone Encounter (Signed)
I have contacted [name] to offer support during hospital admission.He was able to talk with me using the room phone.He would like some water and soda by the bedside. Unit secretary contacted and will attend to his request.I have assisted him to place order for his meals.He is a muslim and does not eat Pork.I have made this dietary exclusion. He has no other requests or concerns at this time.  Jim Server Namita Yearwood RN BSN PCCN [phone]

## 2021-01-14 NOTE — Progress Notes (Signed)
ANTICOAGULATION CONSULT NOTE - Initial Consult  Pharmacy Consult for Heparin infusion  Indication: portal vein thrombosis  No Known Allergies  Patient Measurements:    Ht 66 in Wt 65 kg Heparin dosing weight: 65 kg (TBW as of 12/04/20)  Vital Signs: Temp: 97.6 F (36.4 C) (02/20 0130) Temp Source: Oral (02/20 0130) BP: 165/78 (02/20 0130)  Labs: Recent Labs    01/13/21 2146 01/14/21 0753  HGB 12.3* 12.8*  HCT 36.7* 37.1*  PLT 154 153  LABPROT 15.5*  --   INR 1.3*  --   HEPARINUNFRC  --  0.55  CREATININE 0.95  --     CrCl cannot be calculated (Unknown ideal weight.).   Medical History: Past Medical History:  Diagnosis Date  . BPH (benign prostatic hyperplasia)   . Cirrhosis ([place])   . Hypertension   . Splenomegaly     Medications:  Awaiting home med rec  Assessment: 75 y.o. M with MRI of liver from 01/12/21 that found 7x7 cm left lobe liver mass c/w hepatocellular carcinoma as well as extensive portal vein thrombosis. To begin heparin per pharmacy. No AC reported PTA.  CBC: Hgb 12.8, plt 153 HL 0.55 (therapeutic)  Goal of Therapy:  Heparin level 0.3-0.7 units/ml Monitor platelets by anticoagulation protocol: Yes   Plan:  Continue heparin at current rate 1000 units/hr Daily heparin level Monitor CBC, HL, s/sx bleeding, and transition to oral therapy  Wilson Singer, PharmD PGY1 Pharmacy Resident 01/14/2021 8:25 AM

## 2021-01-14 NOTE — Hospital Course (Addendum)
Jim Little is a 75 y.o. male presenting for direct admission for anticoagulation in setting of portal vein thrombosis. PMH is significant for hepatic mass, cirrhosis, HTN, h/o H. Pylori, urinary retention, thrombocytopenia, and b/l cataracts.   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  Patient direct admitted for anticoagulation due to multitude of risk factors. Patient hypertensive on admission otherwise VSS. Heparin drip was started. Hgb and plts normal on admission, INR mildly elevated to 1.3. AST mildly elevated to 62, ALT WNL.  MRI abdomen (2/18) demonstrated large infiltrating mass of right hepatic lobe consistent with hepatocellular carcinoma, LI-RADS 5 along with advanced cirrhotic changes of liver and borderline splenomegaly.  Liver ultrasound with Doppler (2/20) confirmed right portal vein and right hepatic vein occlusion.  CT chest (2/21) negative for evidence of thoracic metastases. AFP 11,904.0, up from 560.0 eight months ago.  IR consulted for liver biopsy for diagnosis, however IR believed imaging was diagnostic of hepatocellular carcinoma and that oncology would not require biopsy.  Oncology (Dr. Marin Olp) consulted, recommended TACE procedure as outpatient.  Oncology declined to continue anticoagulation, saying that what was previously thought to be portal vein thrombus was likely significant tumor burden.  Heparin drip discontinued at that time. Patient discharged home in stable condition with close follow-up with PCP and oncology. Dr. Marin Olp (onc) to send patient to IR outpatient clinic for TACE procedure.

## 2021-01-14 NOTE — Progress Notes (Addendum)
Family Medicine Teaching Service Daily Progress Note Intern Pager: [phone]  Patient name: Jim Little Medical record number: [id number] Date of birth: August 08, 1946 Age: 75 y.o. Gender: male  Primary Care Provider: [name], MD Consultants: GI Code Status: Full  Pt Overview and Major Events to Date:  01/13/21: admitted for portal vein thrombosis  Assessment and Plan: Jim Little is a 75 y.o. male presenting for direct admission for anticoagulation in setting of portal vein thrombosis. PMH is significant for hepatic mass, cirrhosis, HTN, h/o H. Pylori, urinary retention, thrombocytopenia, and b/l cataracts.   Portal Vein Thrombosis  Hepatic Cirrhosis  Hepatic Mass Patient admitted yesterday for anticoagulation in setting of 7x7 cm left lobe hepatic mass c/w hepatocellular carcinoma. Heparin gtt started. Will obtain heparin doppler today. Hgb and plts normal on admission, INR mildly elevated to 1.3. AST mildly elevated to 62, ALT WNL. Dr. Therisa Doyne, [facility], will consult tomorrow. - Hepatic doppler- NPO after 7 AM (must be NPO for 6-8hours) - Heparin gtt - AM CBC, CMP, PT/INR - PPI - PT/OT - GI consult, appreciate recommendations  HTN Stable, chronic. Asymptomatic, will continue to monitor. - Continue home amlodipine 5 mg.   H/o Urinary Retention So far bladder scans have been normal. Continue to monitor.  H. Pylori Patient had bx (+) H. Pylori in October 2021 and previously treated with triple therapy at that time. Patient tested positive again on 12/04/20 and did not complete triple therapy treatment. Will address as outpatient.  H/o thrombocytopenia- resolved, see above  FEN/GI: heart healthy diet, PPI PPx: heparin gtt   Status is: Inpatient  Remains inpatient appropriate because:Hemodynamically unstable and Ongoing diagnostic testing needed not appropriate for outpatient work up   Dispo: The patient is from: Home              Anticipated d/c is to: Home               Anticipated d/c date is: 2 days              Patient currently is not medically stable to d/c.   Difficult to place patient No  Subjective:  Patient reports that he feels well this morning and slept well overnight.  Objective: Temp:  [97.6 F (36.4 C)] 97.6 F (36.4 C) (02/19 1954) Pulse Rate:  [64] 64 (02/19 1954) Resp:  [15-17] 15 (02/20 0130) BP: (165-180)/(78-86) 165/78 (02/20 0130) SpO2:  [100 %] 100 % (02/19 1954) Physical Exam: General: age-appropriate African man, NAD, WNWD, resting comfortably in bed Cardiovascular: RRR, no m/r/g, 2+ radial pulses Respiratory: CTAB, no increased WOB, no w/r/r Abdomen: soft, NT, ND, normoactive bowel sounds, hepatosplenomegaly Extremities: no peripheral edema, warm, dry  Laboratory: Recent Labs  Lab 01/13/21 2146  WBC 4.9  HGB 12.3*  HCT 36.7*  PLT 154   Recent Labs  Lab 01/13/21 2146  NA 138  K 3.8  CL 103  CO2 28  BUN 7*  CREATININE 0.95  CALCIUM 8.9  PROT 7.4  BILITOT 0.7  ALKPHOS 110  ALT 20  AST 62*  GLUCOSE 136*   Imaging/Diagnostic Tests: MR ABDOMEN WWO CONTRAST  Result Date: 01/12/2021 CLINICAL DATA:  Cirrhosis. EXAM: MRI ABDOMEN WITHOUT AND WITH CONTRAST TECHNIQUE: Multiplanar multisequence MR imaging of the abdomen was performed both before and after the administration of intravenous contrast. CONTRAST:  8mL MULTIHANCE GADOBENATE DIMEGLUMINE 529 MG/ML IV SOLN COMPARISON:  MRI 06/11/2020 FINDINGS: Examination is limited by significant breathing motion artifact. Lower chest: Mild cardiac enlargement. No pericardial effusion.  No obvious pulmonary lesions or pleural effusions. Hepatobiliary: Advanced cirrhotic changes involving the liver. 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 I do not see any definite left hepatic lobe lesions.  This area is also diffusion positive. The gallbladder is unremarkable.  No common bile duct dilatation. Pancreas: No mass, inflammation or ductal dilatation. Moderate pancreatic atrophy. Spleen: Borderline splenomegaly. Splenic length is 14 cm. No focal lesions. Adrenals/Urinary Tract: The adrenal glands and kidneys are grossly normal. No worrisome renal lesions. Stomach/Bowel: Stomach, duodenum, visualized small bowel and visualized colon are grossly normal. Vascular/Lymphatic: The aorta and branch vessels are patent. No obvious adenopathy. Other:  No ascites or abdominal wall hernia. Musculoskeletal: No significant bony findings. IMPRESSION: 1. Advanced cirrhotic changes involving the liver. There is a large infiltrating mass versus numerous confluent masses involving the right hepatic lobe and consistent with hepatocellular carcinoma. Associated thrombosed middle and portal veins. No definite left hepatic lobe lesions. LI-RADS 5. 2. Borderline splenomegaly. 3. No ascites or abdominal wall hernia. Electronically Signed   By: Marijo Sanes M.D.   On: 01/12/2021 16:36   Gladys Damme, MD 01/14/2021, 2:07 AM PGY-2, [place] Intern pager: [phone], text pages welcome

## 2021-01-15 ENCOUNTER — Inpatient Hospital Stay (HOSPITAL_COMMUNITY): Payer: Medicaid Other

## 2021-01-15 ENCOUNTER — Encounter (HOSPITAL_COMMUNITY): Payer: Self-pay | Admitting: Family Medicine

## 2021-01-15 ENCOUNTER — Telehealth: Payer: Self-pay

## 2021-01-15 DIAGNOSIS — K7460 Unspecified cirrhosis of liver: Secondary | ICD-10-CM

## 2021-01-15 DIAGNOSIS — I820 Budd-Chiari syndrome: Secondary | ICD-10-CM | POA: Diagnosis not present

## 2021-01-15 DIAGNOSIS — C220 Liver cell carcinoma: Secondary | ICD-10-CM

## 2021-01-15 DIAGNOSIS — R160 Hepatomegaly, not elsewhere classified: Secondary | ICD-10-CM | POA: Diagnosis not present

## 2021-01-15 DIAGNOSIS — I81 Portal vein thrombosis: Secondary | ICD-10-CM | POA: Diagnosis not present

## 2021-01-15 LAB — RETICULOCYTES
Immature Retic Fract: 7.2 % (ref 2.3–15.9)
RBC.: 3.91 MIL/uL — ABNORMAL LOW (ref 4.22–5.81)
Retic Count, Absolute: 36.8 10*3/uL (ref 19.0–186.0)
Retic Ct Pct: 0.9 % (ref 0.4–3.1)

## 2021-01-15 LAB — COMPREHENSIVE METABOLIC PANEL
ALT: 20 U/L (ref 0–44)
AST: 48 U/L — ABNORMAL HIGH (ref 15–41)
Albumin: 3.1 g/dL — ABNORMAL LOW (ref 3.5–5.0)
Alkaline Phosphatase: 108 U/L (ref 38–126)
Anion gap: 7 (ref 5–15)
BUN: 5 mg/dL — ABNORMAL LOW (ref 8–23)
CO2: 27 mmol/L (ref 22–32)
Calcium: 9 mg/dL (ref 8.9–10.3)
Chloride: 102 mmol/L (ref 98–111)
Creatinine, Ser: 0.96 mg/dL (ref 0.61–1.24)
GFR, Estimated: >60 mL/min (ref >60)
Glucose, Bld: 100 mg/dL — ABNORMAL HIGH (ref 70–99)
Potassium: 4 mmol/L (ref 3.5–5.1)
Sodium: 136 mmol/L (ref 135–145)
Total Bilirubin: 1.1 mg/dL (ref 0.3–1.2)
Total Protein: 7.5 g/dL (ref 6.5–8.1)

## 2021-01-15 LAB — IRON AND TIBC
Iron: 91 ug/dL (ref 45–182)
Saturation Ratios: 39 % (ref 17.9–39.5)
TIBC: 234 ug/dL — ABNORMAL LOW (ref 250–450)
UIBC: 143 ug/dL

## 2021-01-15 LAB — FERRITIN: Ferritin: 150 ng/mL (ref 24–336)

## 2021-01-15 LAB — PROTIME-INR
INR: 1.3 — ABNORMAL HIGH (ref 0.8–1.2)
Prothrombin Time: 16 seconds — ABNORMAL HIGH (ref 11.4–15.2)

## 2021-01-15 LAB — CBC
HCT: 38.5 % — ABNORMAL LOW (ref 39.0–52.0)
Hemoglobin: 12.8 g/dL — ABNORMAL LOW (ref 13.0–17.0)
MCH: 32.6 pg (ref 26.0–34.0)
MCHC: 33.2 g/dL (ref 30.0–36.0)
MCV: 98 fL (ref 80.0–100.0)
Platelets: 163 10*3/uL (ref 150–400)
RBC: 3.93 MIL/uL — ABNORMAL LOW (ref 4.22–5.81)
RDW: 13.4 % (ref 11.5–15.5)
WBC: 4.5 10*3/uL (ref 4.0–10.5)
nRBC: 0 % (ref 0.0–0.2)

## 2021-01-15 LAB — HEPARIN LEVEL (UNFRACTIONATED): Heparin Unfractionated: 0.43 IU/mL (ref 0.30–0.70)

## 2021-01-15 LAB — SARS CORONAVIRUS 2 (TAT 6-24 HRS): SARS Coronavirus 2: NEGATIVE

## 2021-01-15 LAB — AFP TUMOR MARKER: AFP, Serum, Tumor Marker: 11904 ng/mL — ABNORMAL HIGH (ref 0.0–8.3)

## 2021-01-15 MED ORDER — IOHEXOL 300 MG/ML  SOLN
75.0000 mL | Freq: Once | INTRAMUSCULAR | Status: AC | PRN
  Administered 2021-01-15: 75 mL via INTRAVENOUS

## 2021-01-15 MED ORDER — NAPHAZOLINE-GLYCERIN 0.012-0.2 % OP SOLN
1.0000 [drp] | Freq: Four times a day (QID) | OPHTHALMIC | Status: DC | PRN
  Administered 2021-01-16: 1 [drp] via OPHTHALMIC
  Filled 2021-01-15: qty 15

## 2021-01-15 MED ORDER — APIXABAN 5 MG PO TABS
5.0000 mg | ORAL_TABLET | Freq: Two times a day (BID) | ORAL | Status: DC
  Administered 2021-01-15 (×2): 5 mg via ORAL
  Filled 2021-01-15 (×2): qty 1

## 2021-01-15 MED ORDER — SENNA 8.6 MG PO TABS
1.0000 | ORAL_TABLET | Freq: Once | ORAL | Status: AC
  Administered 2021-01-15: 8.6 mg via ORAL
  Filled 2021-01-15: qty 1

## 2021-01-15 MED ORDER — DOCUSATE SODIUM 100 MG PO CAPS
100.0000 mg | ORAL_CAPSULE | Freq: Two times a day (BID) | ORAL | Status: DC
  Administered 2021-01-15 – 2021-01-16 (×2): 100 mg via ORAL
  Filled 2021-01-15 (×2): qty 1

## 2021-01-15 MED ORDER — BENZOCAINE 10 % MT GEL
Freq: Three times a day (TID) | OROMUCOSAL | Status: DC | PRN
  Filled 2021-01-15: qty 9

## 2021-01-15 NOTE — Progress Notes (Addendum)
ANTICOAGULATION CONSULT NOTE - Initial Consult  Pharmacy Consult for Heparin infusion  Indication: portal and hepatic vein thrombosis   Allergies  Allergen Reactions  . Pork-Derived Products     Religious reasons.Patient is a muslim    Patient Measurements: Ht 66 in Wt 65 kg Heparin dosing weight: 65 kg (TBW as of 12/04/20)  Vital Signs: Temp: 98.8 F (37.1 C) (02/20 2242) Temp Source: Oral (02/20 2242) BP: 155/77 (02/20 2242) Pulse Rate: 74 (02/20 2242)  Labs: Recent Labs    01/13/21 2146 01/14/21 0753 01/15/21 0320  HGB 12.3* 12.8* 12.8*  HCT 36.7* 37.1* 38.5*  PLT 154 153 163  LABPROT 15.5*  --  16.0*  INR 1.3*  --  1.3*  HEPARINUNFRC  --  0.55 0.43  CREATININE 0.95 0.88 0.96    CrCl cannot be calculated (Unknown ideal weight.).   Medical History: Past Medical History:  Diagnosis Date  . BPH (benign prostatic hyperplasia)   . Cirrhosis ([place])   . Hypertension   . Splenomegaly     Medications:  Awaiting home med rec  Assessment: 75 y.o. M with MRI of liver from 01/12/21 that found 7x7 cm left lobe liver mass c/w hepatocellular carcinoma as well as extensive hepatic portal vein occlusion. Pharmacy consulted for heparin. No AC reported PTA.  HL 0.43 therapeutic but down trended likely after bolus was metabolized. H/H, plt stable. Will increase slightly to prevent further drop.   Goal of Therapy:  Heparin level 0.3-0.7 units/ml Monitor platelets by anticoagulation protocol: Yes   Plan:  Increase heparin to 1050 units/hr Daily heparin level Monitor CBC, HL, s/sx bleeding F/u transition to apixaban after liver biopsy  Benetta Spar, PharmD, BCPS, BCCP Clinical Pharmacist  Please check AMION for all [place] phone numbers After 10:00 PM, call [place]

## 2021-01-15 NOTE — Progress Notes (Signed)
Spoke with radiology to schedule liver biopsy.  Case is currently under review by PA.  Carollee Leitz, MD Family Medicine Residency

## 2021-01-15 NOTE — Progress Notes (Addendum)
Interventional Radiology Brief Note:  Jim Little is a 75 year old male with past medical history of hepatic cirrhosis and indeterminate hepatic lesions who is followed by GI for history of H pylori.  He is known to IR from prior US-guided liver lesion biopsy 07/14/20 which showed a regenerative nodule.  He underwent repeat follow-up MR Liver 01/12/21 which showed large infiltrating vs. Confluent masses involving the right lobe and portal vein thrombosis.  He was direct admitted by teaching service to initiate anticoagulation for this portal vein thrombosis.  IR now consulted for liver mass biopsy.    Case reviewed by Dr. [name] who notes LIRAD 5 characterization on MR Abdomen diagnostic for [facility].  Prior to consideration for biopsy, recommend tumor board discussion. IR would be available to discuss liver-directed therapies if patient and oncology team amenable.   Brynda Greathouse, MS RD PA-C

## 2021-01-15 NOTE — Congregational Nurse Program (Signed)
  Dept: [facility] Nurse Program Note  Date of Encounter: 01/15/2021  Past Medical History: Past Medical History:  Diagnosis Date  . BPH (benign prostatic hyperplasia)   . Cirrhosis ([place])   . Hypertension   . Splenomegaly     Encounter Details:  Visited with patient with patient in his room  2W24. No needs at this time. He does not like the hospital food. I have requested family to bring his favorite food Ugali if able.   Honor Loh RN BSn P[facility]N  [facility] Nurse [phone]

## 2021-01-15 NOTE — Evaluation (Signed)
Physical Therapy Evaluation Patient Details Name: Jim Little MRN: [id number] DOB: [birth year] Today's Date: 01/15/2021   History of Present Illness  75 yo gentleman with history of cirrhosis, hypertension, urinary retention due to severe BPH, and splenomegaly presenting with abdominal pain and portal vein thrombosis. Also found hepatic mass and biopsy pending.  Clinical Impression   Pt admitted with above diagnosis. PTA patient was using quad cane intermittently inside and outside home. He reports he assisted with caring for young children in the home.  Pt currently requires minguard assist when walking without device with slightly unsteady gait reporting both pain and fatigue limiting his distance to 40 ft.  Patient with the following functional limitations due to the deficits listed below: decreased balance, impaired gait, and decr knowledge of use of DME. Pt will benefit from skilled PT to increase their independence and safety with mobility to allow discharge to the venue listed below.        Follow Up Recommendations No PT follow up    Equipment Recommendations  None recommended by PT    Recommendations for Other Services       Precautions / Restrictions Precautions Precautions: Fall Restrictions Weight Bearing Restrictions: No      Mobility  Bed Mobility               General bed mobility comments: sitting EOB on entry    Transfers Overall transfer level: Needs assistance Equipment used: Quad cane;None Transfers: Sit to/from Stand Sit to Stand: Min guard         General transfer comment: min guard for sit to stand using quad cane initially, then left quad cane behind and ambulated unsteadily/slowly in room without AD  Ambulation/Gait Ambulation/Gait assistance: Min guard Gait Distance (Feet): 40 Feet Assistive device: None Gait Pattern/deviations: Step-through pattern;Decreased stride length;Wide base of support;Trunk flexed Gait velocity: decr   General  Gait Details: Reports he cannot fully stand upright during gait due to pain in his buttocks  Stairs            Wheelchair Mobility    Modified Rankin (Stroke Patients Only)       Balance Overall balance assessment: Needs assistance Sitting-balance support: Feet supported;No upper extremity supported Sitting balance-Leahy Scale: Good     Standing balance support: No upper extremity supported;During functional activity Standing balance-Leahy Scale: Fair Standing balance comment: fair static standing though limitations noted with mobility without AD                             Pertinent Vitals/Pain Pain Assessment: Faces Faces Pain Scale: No hurt Pain Intervention(s): Monitored during session    Home Living Family/patient expects to be discharged to:: Private residence Living Arrangements: Spouse/significant other;Children Available Help at Discharge: Family Type of Home: House       Home Layout: Two level Home Equipment: [place] - quad;Shower seat Additional Comments: Has a disabled child that he cares for. reports many young children in the home, all 10 years and under. Has older children that live in [place]. Reports plan to move from current home to purchase a new home - plans to have bedroom on main level    Prior Function Level of Independence: Independent with assistive device(s)         Comments: Use of quad cane for mobility in home/outside in community. Reports able to complete ADLs (uses shower chair), cares for young children in the home     Hand Dominance  Dominant Hand: Right    Extremity/Trunk Assessment   Upper Extremity Assessment Upper Extremity Assessment: Defer to OT evaluation    Lower Extremity Assessment Lower Extremity Assessment: Overall WFL for tasks assessed    Cervical / Trunk Assessment Cervical / Trunk Assessment: Kyphotic  Communication   Communication: Prefers language other than [place];Interpreter utilized  Neurosurgeon)  Cognition Arousal/Alertness: Awake/alert Behavior During Therapy: Flat affect;Impulsive Overall Cognitive Status: Difficult to assess Area of Impairment: Safety/judgement;Awareness;Problem solving                         Safety/Judgement: Decreased awareness of safety;Decreased awareness of deficits Awareness: Emergent Problem Solving: Slow processing;Requires verbal cues;Requires tactile cues;Decreased initiation;Difficulty sequencing General Comments: Pt with questionable safety awareness deficits (speaks Swahili so difficult to determine). reports using quad cane for mobility but then leaves it behind during mobility with therapists. Pt with decreased awarenes of deficits, reports unable to stand upright due to pain in bottom region then leg feeling heavy after walking but still declined use of cane      General Comments General comments (skin integrity, edema, etc.): Question if pt has had hx of falls at home, unable to ask this session. MD entering and assessing pt - left pt up in recliner with MD    Exercises     Assessment/Plan    PT Assessment Patient needs continued PT services  PT Problem List Decreased activity tolerance;Decreased balance;Decreased mobility;Decreased knowledge of use of DME;Decreased safety awareness;Pain       PT Treatment Interventions DME instruction;Gait training    PT Goals (Current goals can be found in the Care Plan section)  Acute Rehab PT Goals Patient Stated Goal: regain strength PT Goal Formulation: With patient Time For Goal Achievement: 01/29/21 Potential to Achieve Goals: Good    Frequency Min 3X/week   Barriers to discharge        Co-evaluation PT/OT/SLP Co-Evaluation/Treatment: Yes Reason for Co-Treatment: Other (comment) (Use of Swahli interpreter) PT goals addressed during session: Mobility/safety with mobility;Balance OT goals addressed during session: ADL's and self-care       AM-PAC PT "6  Clicks" Mobility  Outcome Measure Help needed turning from your back to your side while in a flat bed without using bedrails?: None Help needed moving from lying on your back to sitting on the side of a flat bed without using bedrails?: None Help needed moving to and from a bed to a chair (including a wheelchair)?: A Little Help needed standing up from a chair using your arms (e.g., wheelchair or bedside chair)?: A Little Help needed to walk in hospital room?: A Little Help needed climbing 3-5 steps with a railing? : A Little 6 Click Score: 20    End of Session   Activity Tolerance: Patient limited by fatigue Patient left: in chair;with call bell/phone within reach;Other (comment) (with MD)   PT Visit Diagnosis: Pain;Difficulty in walking, not elsewhere classified (R26.2) Pain - part of body:  (buttocks)    Time: 4801-6553 PT Time Calculation (min) (ACUTE ONLY): 19 min   Charges:   PT Evaluation $PT Eval Low Complexity: 1 Low           Arby Barrette, PT Pager [phone]   Rexanne Mano 01/15/2021, 12:11 PM

## 2021-01-15 NOTE — Progress Notes (Signed)
Family Medicine Teaching Service Daily Progress Note Intern Pager: [phone]  Patient name: Jim Little Medical record number: [id number] Date of birth: [birth year] Age: 75 y.o. Gender: male  Primary Care Provider: [name], MD Consultants: GI Code Status: Full  Pt Overview and Major Events to Date:  2/19 admitted for portal vein thrombosis  Assessment and Plan: Jim Little a 75 y.o.malepresenting for direct admission for anticoagulation in setting of portal vein thrombosis. PMH is significant forhepatic mass, cirrhosis, HTN, h/o H. Pylori, urinary retention, thrombocytopenia, and b/l cataracts.   Portal Vein Thrombosis  Hepatic Cirrhosis  Hepatic Mass Patient admitted 2/19 for anticoagulation of portal vein thrombosis.  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  Heparin ggt 1000 units/h started 2/20.  This morning AST 48, ALT 20.  PT 16.0, INR 1.3.  IR consulted, declined initial liver biopsy at this time due to diagnostic imaging (but offered biopsy if Onc thought helpful). -Onc consult, appreciate recommendations -Continue heparin drip -Start Eliquis 5 mg tonight, DC heparin at the time of Eliquis administration -Protonix 40 mg p.o. daily -PT/OT -Likely DC home tomorrow with outpatient oncology follow-up   HTN: Stable, chronic BP last 24 hours 155-177/77-86, pulses 74-84. -Continue amlodipine 5 mg  H/o Urinary Retention Last bladder scan recorded 230 mL on 2/20 at 1700. -Continue Flomax 0.4 mg daily -Continue bladder scan every shift  H. Pylori Patient had bx (+) H. Pylori in October 2021 and previously treated with triple therapy at that time. Patient tested positive again on 12/04/20 and did not complete triple therapy treatment. Will address as outpatient.  FEN/GI:  Heart healthy diet, Protonix PPx:  Heparin drip   Status is: Inpatient   Remains inpatient appropriate because:Ongoing diagnostic testing  needed not appropriate for outpatient work up, IV treatments appropriate due to intensity of illness or inability to take PO and Inpatient level of care appropriate due to severity of illness   Dispo:  Patient From: Home  Planned Disposition: Home with Health Care Svc  Expected discharge date: 01/17/2021  Medically stable for discharge: No       Subjective:  Patient was found walking in room with PT at bedside.  Video Swahili interpreter used for this encounter.  Patient voiced good understanding of his condition and testing thus far.  I explained we would get oncology specialists on board, would likely need liver biopsy.  He voiced understanding and said that would be okay.  He has 3 complaints this morning; blood draws, eyes, teeth.  He reports that he is fine with daily blood draws, but says that we need to feed him well in order for him to replenish his blood stores.  He prefers hot tea and milk for this.  For eyes, endorses eye dryness and irritation.  Lastly, reports that poor dentition is making it difficult to eat in addition to his stomach fluids  Objective: Temp:  [98.3 F (36.8 C)-98.8 F (37.1 C)] 98.8 F (37.1 C) (02/20 2242) Pulse Rate:  [74-84] 74 (02/20 2242) Resp:  [18-20] 20 (02/20 2242) BP: (155-177)/(77-86) 155/77 (02/20 2242) SpO2:  [97 %-100 %] 100 % (02/20 2242)  Physical Exam: General: Awake, alert, oriented, no acute distress HEENT: EOM intact, poor dentition with visible cavities and discoloration of tooth root areas Respiratory: Unlabored respirations, speaking in full sentences, no respiratory distress Extremities: Moving all extremities spontaneously Neuro: Cranial nerves II through X grossly intact Psych: Normal insight and judgement   Laboratory: Recent Labs  Lab 01/13/21  2146 01/14/21 0753 01/15/21 0320  WBC 4.9 4.3 4.5  HGB 12.3* 12.8* 12.8*  HCT 36.7* 37.1* 38.5*  PLT 154 153 163   Recent Labs  Lab 01/13/21 2146 01/14/21 0753  01/15/21 0320  NA 138 139 136  K 3.8 4.2 4.0  CL 103 103 102  CO2 28 30 27   BUN 7* 6* 5*  CREATININE 0.95 0.88 0.96  CALCIUM 8.9 9.3 9.0  PROT 7.4 7.5 7.5  BILITOT 0.7 0.5 1.1  ALKPHOS 110 107 108  ALT 20 21 20   AST 62* 57* 48*  GLUCOSE 136* 100* 100*    Imaging/Diagnostic Tests: DUPLEX ULTRASOUND OF LIVER 01/14/2021 IMPRESSION: Directed duplex of the hepatic vasculature confirms right portal vein and right hepatic vein occlusion, presumably from tumor thrombus given the right-sided liver tumor on MRI. The ultrasound correlate of the previously demonstrated right liver tumor is heterogeneous tissue with ill-defined margin. In addition to the right-sided tumor, there are several small hyperechoic foci of approximately 1 cm, potentially additional Gower foci versus regenerative nodules. Cirrhosis and splenomegaly  MRI ABDOMEN WITHOUT AND WITH CONTRAST 01/12/2021 IMPRESSION: 1. Advanced cirrhotic changes involving the liver. There is a large infiltrating mass versus numerous confluent masses involving the right hepatic lobe and consistent with hepatocellular carcinoma. Associated thrombosed middle and portal veins. No definite left hepatic lobe lesions. LI-RADS 5. 2. Borderline splenomegaly. 3. No ascites or abdominal wall hernia.   Ezequiel Essex, MD 01/15/2021, 7:27 AM PGY-1, [place] Intern pager: [phone], text pages welcome

## 2021-01-15 NOTE — Consult Note (Addendum)
[place]  [phone] Fax:([phone]   MEDICAL ONCOLOGY - INITIAL CONSULTATION  Referral MD: Dr. [name]   Reason for Referral: Liver mass concerning for [facility]  HPI: Jim Little is a 75 year old male with a past medical history significant for cirrhosis (unclear etiology as prior labs indicate that he is negative for hepatitis B and C), hepatic mass present since 04/2020 and biopsied in 06/2020 (biopsy consistent with changes of cirrhosis with regenerative nodules and degenerative atypia), hypertension, BPH, and splenomegaly.  The patient was a direct admit for portal vein thrombosis.  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  The patient has been started on heparin for the portal vein thrombosis with plans to transition to oral anticoagulation soon.  A liver ultrasound was performed 01/14/2021 which confirmed the right portal vein and right hepatic vein occlusion presumably from tumor thrombus and in addition to the right-sided tumor, there are several small hyperechoic foci of approxi-1 cm which are potentially additional [place] foci versus regenerative nodules.  The ultrasound also confirms cirrhosis and splenomegaly.  AFP was drawn 01/14/2021 is currently pending.  The patient was seen today with the assistance of a video interpreter.  The patient tells me that he has had generalized weakness and constipation for 2 weeks.  Bowels still have not moved since being here in the hospital.  He reports that he has had a poor appetite and has lost weight but cannot tell me how much weight he has lost.  He has had some abdominal bloating but no nausea or vomiting.  He also reports  that he has had some blurriness in his eyes and tearing.  Denies diarrhea.  Denies recent history of fevers, chills, headaches, dizziness, jaundice, chest pain, shortness of breath, bleeding.  The patient is married and tells me that he has 4 children, 1 deceased.  Denies history of alcohol and tobacco use.  Medical oncology was asked to the patient make recommendations regarding his liver mass.    Past Medical History:  Diagnosis Date  . BPH (benign prostatic hyperplasia)   . Cirrhosis ([place])   . Hypertension   . Splenomegaly   :  Past Surgical History:  Procedure Laterality Date  . BIOPSY  08/22/2020   Procedure: BIOPSY;  Surgeon: Ronnette Juniper, MD;  Location: [facility];  Service: Gastroenterology;;  . COLONOSCOPY WITH PROPOFOL N/A 08/22/2020   Procedure: COLONOSCOPY WITH PROPOFOL;  Surgeon: Ronnette Juniper, MD;  Location: [facility];  Service: Gastroenterology;  Laterality: N/A;  . ESOPHAGOGASTRODUODENOSCOPY (EGD) WITH PROPOFOL N/A 08/22/2020   Procedure: ESOPHAGOGASTRODUODENOSCOPY (EGD) WITH PROPOFOL;  Surgeon: Ronnette Juniper, MD;  Location: [facility];  Service: Gastroenterology;  Laterality: N/A;  . UPPER GASTROINTESTINAL ENDOSCOPY  03/2018   [place] --candidiasis plus esophageal varices grade 2.  Treated with fluconazole and status post esophageal banding.  Mar[facility] Kitchen US ECHOCARDIOGRAPHY  10/2016   per overseas records from [place] - LVEF 68%; mildly calcified aortic valve, normal function otherwise  :  Current Facility-Administered Medications  Medication Dose Route Frequency Provider Last Rate Last Admin  . amLODipine (NORVASC) tablet 5 mg  5 mg Oral Daily Gladys Damme, MD   5 mg at 01/15/21 1478  . docusate sodium (COLACE) capsule 100 mg  100 mg Oral  Daily Carollee Leitz, MD   100 mg at 01/15/21 4315  . folic acid (FOLVITE) tablet 1 mg  1 mg Oral Daily Gladys Damme, MD   1 mg at 01/15/21 4008  . heparin ADULT infusion 100 units/mL (25000 units/260mL)  1,050 Units/hr  Intravenous Continuous Donnamae Jude, RPH 10.5 mL/hr at 01/15/21 0806 1,050 Units/hr at 01/15/21 0806  . pantoprazole (PROTONIX) EC tablet 40 mg  40 mg Oral Daily Meccariello, Bernita Raisin, DO   40 mg at 01/15/21 6761  . polyethylene glycol (MIRALAX / GLYCOLAX) packet 17 g  17 g Oral BID Carollee Leitz, MD   17 g at 01/15/21 0807  . tamsulosin (FLOMAX) capsule 0.4 mg  0.4 mg Oral Daily Gladys Damme, MD   0.4 mg at 01/15/21 9509     Allergies  Allergen Reactions  . Pork-Derived Products     Religious reasons.Patient is a muslim  :  History reviewed. No pertinent family history.:  Social History   Socioeconomic History  . Marital status: Married    Spouse name: Not on file  . Number of children: 2  . Years of education: Not on file  . Highest education level: Not on file  Occupational History  . Not on file  Tobacco Use  . Smoking status: Never Smoker  . Smokeless tobacco: Never Used  Substance and Sexual Activity  . Alcohol use: Never  . Drug use: Not on file  . Sexual activity: Not on file  Other Topics Concern  . Not on file  Social History Narrative  . Not on file   Social Determinants of Health   Financial Resource Strain: Not on file  Food Insecurity: Not on file  Transportation Needs: Not on file  Physical Activity: Not on file  Stress: Not on file  Social Connections: Not on file  Intimate Partner Violence: Not on file  :  Review of Systems: A comprehensive 14 point review of systems was negative except as noted in the HPI.  Exam: Patient Vitals for the past 24 hrs:  BP Temp Temp src Pulse Resp SpO2  01/15/21 0807 (!) 161/85 -- -- -- -- --  01/14/21 2242 (!) 155/77 98.8 F (37.1 C) Oral 74 20 100 %  01/14/21 1616 (!) 177/86 -- -- 84 18 97 %    General: Awake and alert, no distress Eyes:  no scleral icterus.   ENT:  There were no oropharyngeal lesions.    Lymphatics:  Negative cervical, supraclavicular or axillary adenopathy.   Respiratory: lungs were  clear bilaterally without wheezing or crackles.   Cardiovascular:  Regular rate and rhythm, S1/S2, without murmur, rub or gallop.  There was no pedal edema.   GI:  abdomen was soft, flat, nontender, nondistended, hepatosplenomegaly noted.   Musculoskeletal: Strength symmetrical in the upper and lower extremities. Skin exam was without echymosis, petichae.   Neuro exam was nonfocal. Patient was alert and oriented.  Attention was good.     Lab Results  Component Value Date   WBC 4.5 01/15/2021   HGB 12.8 (L) 01/15/2021   HCT 38.5 (L) 01/15/2021   PLT 163 01/15/2021   GLUCOSE 100 (H) 01/15/2021   CHOL 143 04/14/2018   TRIG 87 04/14/2018   HDL 30 (L) 04/14/2018   LDLCALC 96 04/14/2018   ALT 20 01/15/2021   AST 48 (H) 01/15/2021   NA 136 01/15/2021   K 4.0 01/15/2021   CL 102 01/15/2021   CREATININE 0.96 01/15/2021   BUN 5 (L) 01/15/2021  CO2 27 01/15/2021    MR ABDOMEN WWO CONTRAST  Result Date: 01/12/2021 CLINICAL DATA:  Cirrhosis. EXAM: MRI ABDOMEN WITHOUT AND WITH CONTRAST TECHNIQUE: Multiplanar multisequence MR imaging of the abdomen was performed both before and after the administration of intravenous contrast. CONTRAST:  57mL MULTIHANCE GADOBENATE DIMEGLUMINE 529 MG/ML IV SOLN COMPARISON:  MRI 06/11/2020 FINDINGS: Examination is limited by significant breathing motion artifact. Lower chest: Mild cardiac enlargement. No pericardial effusion. No obvious pulmonary lesions or pleural effusions. Hepatobiliary: Advanced cirrhotic changes involving the liver. 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 I do not see any definite left hepatic lobe lesions. This area is also diffusion positive. The gallbladder is unremarkable.  No common bile duct dilatation. Pancreas: No mass, inflammation or ductal dilatation.  Moderate pancreatic atrophy. Spleen: Borderline splenomegaly. Splenic length is 14 cm. No focal lesions. Adrenals/Urinary Tract: The adrenal glands and kidneys are grossly normal. No worrisome renal lesions. Stomach/Bowel: Stomach, duodenum, visualized small bowel and visualized colon are grossly normal. Vascular/Lymphatic: The aorta and branch vessels are patent. No obvious adenopathy. Other:  No ascites or abdominal wall hernia. Musculoskeletal: No significant bony findings. IMPRESSION: 1. Advanced cirrhotic changes involving the liver. There is a large infiltrating mass versus numerous confluent masses involving the right hepatic lobe and consistent with hepatocellular carcinoma. Associated thrombosed middle and portal veins. No definite left hepatic lobe lesions. LI-RADS 5. 2. Borderline splenomegaly. 3. No ascites or abdominal wall hernia. Electronically Signed   By: Marijo Sanes M.D.   On: 01/12/2021 16:36   US LIVER DOPPLER  Result Date: 01/15/2021 CLINICAL DATA:  76 year old male with known cirrhosis, liver mass on recent MRI. Prior biopsy 07/14/2020 EXAM: DUPLEX ULTRASOUND OF LIVER TECHNIQUE: Color and duplex Doppler ultrasound was performed to evaluate the hepatic in-flow and out-flow vessels. COMPARISON:  MR 01/12/2021 FINDINGS: Portal Vein Velocities Main:  29 cm/sec Right:  Thrombosed Left:  24 cm/sec Hepatic Vein Velocities Right: Not visualized, likely thrombosed. Middle:  30 cm/sec Left:  110 cm/sec Hepatic Artery Velocity:  99 cm/sec Splenic Vein Velocity:  16 cm/sec Varices: None visualized Ascites: None visualized Cirrhotic changes of the liver with nodular contour and heterogeneous echotexture. Ultrasound demonstrates multiple small hyperechoic foci. The largest measured on the left 1 cm. The ultrasound correlate of the right-sided liver mass on MRI is ill-defined heterogeneously echoic soft tissue, without well-defined margin. Splenic volume estimated 250 cc IMPRESSION: Directed duplex of  the hepatic vasculature confirms right portal vein and right hepatic vein occlusion, presumably from tumor thrombus given the right-sided liver tumor on MRI. The ultrasound correlate of the previously demonstrated right liver tumor is heterogeneous tissue with ill-defined margin. In addition to the right-sided tumor, there are several small hyperechoic foci of approximately 1 cm, potentially additional [place] foci versus regenerative nodules. Cirrhosis and splenomegaly Electronically Signed   By: Corrie Mckusick D.O.   On: 01/15/2021 05:10     MR ABDOMEN WWO CONTRAST  Result Date: 01/12/2021 CLINICAL DATA:  Cirrhosis. EXAM: MRI ABDOMEN WITHOUT AND WITH CONTRAST TECHNIQUE: Multiplanar multisequence MR imaging of the abdomen was performed both before and after the administration of intravenous contrast. CONTRAST:  37mL MULTIHANCE GADOBENATE DIMEGLUMINE 529 MG/ML IV SOLN COMPARISON:  MRI 06/11/2020 FINDINGS: Examination is limited by significant breathing motion artifact. Lower chest: Mild cardiac enlargement. No pericardial effusion. No obvious pulmonary lesions or pleural effusions.  Hepatobiliary: Advanced cirrhotic changes involving the liver. 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 I do not see any definite left hepatic lobe lesions. This area is also diffusion positive. The gallbladder is unremarkable.  No common bile duct dilatation. Pancreas: No mass, inflammation or ductal dilatation. Moderate pancreatic atrophy. Spleen: Borderline splenomegaly. Splenic length is 14 cm. No focal lesions. Adrenals/Urinary Tract: The adrenal glands and kidneys are grossly normal. No worrisome renal lesions. Stomach/Bowel: Stomach, duodenum, visualized small bowel and visualized colon are grossly normal. Vascular/Lymphatic: The aorta and branch  vessels are patent. No obvious adenopathy. Other:  No ascites or abdominal wall hernia. Musculoskeletal: No significant bony findings. IMPRESSION: 1. Advanced cirrhotic changes involving the liver. There is a large infiltrating mass versus numerous confluent masses involving the right hepatic lobe and consistent with hepatocellular carcinoma. Associated thrombosed middle and portal veins. No definite left hepatic lobe lesions. LI-RADS 5. 2. Borderline splenomegaly. 3. No ascites or abdominal wall hernia. Electronically Signed   By: Marijo Sanes M.D.   On: 01/12/2021 16:36   US LIVER DOPPLER  Result Date: 01/15/2021 CLINICAL DATA:  75 year old male with known cirrhosis, liver mass on recent MRI. Prior biopsy 07/14/2020 EXAM: DUPLEX ULTRASOUND OF LIVER TECHNIQUE: Color and duplex Doppler ultrasound was performed to evaluate the hepatic in-flow and out-flow vessels. COMPARISON:  MR 01/12/2021 FINDINGS: Portal Vein Velocities Main:  29 cm/sec Right:  Thrombosed Left:  24 cm/sec Hepatic Vein Velocities Right: Not visualized, likely thrombosed. Middle:  30 cm/sec Left:  110 cm/sec Hepatic Artery Velocity:  99 cm/sec Splenic Vein Velocity:  16 cm/sec Varices: None visualized Ascites: None visualized Cirrhotic changes of the liver with nodular contour and heterogeneous echotexture. Ultrasound demonstrates multiple small hyperechoic foci. The largest measured on the left 1 cm. The ultrasound correlate of the right-sided liver mass on MRI is ill-defined heterogeneously echoic soft tissue, without well-defined margin. 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 The ultrasound correlate of the previously demonstrated right liver tumor is heterogeneous tissue with ill-defined margin. In addition to the right-sided tumor, there are several small  hyperechoic foci of approximately 1 cm, potentially additional [place] foci versus regenerative nodules. Cirrhosis and splenomegaly Electronically Signed   By: Corrie Mckusick D.O.   On: 01/15/2021 05:10   Assessment and Plan: This is a 75 year old male with 1.  Liver mass consistent with [place] 2.  Cirrhosis with splenomegaly 3.  Portal vein thrombosis 4.  Hypertension 5.  BPH  -I discussed the findings on imaging with the patient through the interpreter.  We discussed that imaging findings are concerning for [place].  Will await AFP.  Will defer to Dr. Marin Olp whether liver biopsy is needed.  Would also recommend completing staging work-up including a CT of the chest. -The patient has cirrhosis with splenomegaly of unclear etiology.  Work-up for viral hepatitis negative.  Will monitor for now. -Continue heparin for portal vein thrombosis.  However, ultrasound suggest this could be tumor thrombus.  Will defer to Dr. Marin Olp regarding continuation of anticoagulation.  Thank you for this referral.   Mikey Bussing, DNP, AGPCNP-BC, AOCNP  ADDENDUM: I saw and examined Jim Little.  He is nice.  We used an interpreter.  [place]  He has been in the [place] for about 2 years.  He said he is a refugee.  He has a large tumor in the right hepatic lobe with tumor thrombus in the hepatic vein and portal vein.  His alpha-fetoprotein is over 11,000.  This is clearly makes the diagnosis for [place].  I am not sure how he would have hepatocellular carcinoma since he does not have a history of hepatitis B or hepatitis C.  He has no history of alcoholic cirrhosis.  He still appears to have decent hepatic function.  Depending on what type of staging system you use, he probably does not have that great of a prognosis because of the tumor thrombus in the portal vein.  Given that his tumor seems to be confined to the right lobe of the liver, I thought that some type of local therapy would be reasonable.  I  think that TACE would be a good way to go to treat this and try to shrink it down.  As such, I would get interventional radiology to see him to see what they can do about this.  We could certainly use systemic therapy.  As far as I can tell, he does not have systemic disease.  I would get a CT scan of his chest to make sure there is no metastatic disease to his chest.  Given that the alpha-fetoprotein still high, he might actually have metastatic disease.  If we find that he has systemic disease, I would consider him for immunotherapy in tandem with Avastin.  I think this would be considered standard of care for unresectable, advanced hepatocellular carcinoma.  I am not sure what kind of living the situation he has.  He says that he lives with his wife and 4 children.  I would not put her on anticoagulation since the thrombus is tumor thrombus.  He says that whenever he eats, he gets bloated.  This will have to be addressed.  It looks like he has lost some weight.  It would be nice if this could ultimately be resected.  I am not sure of that could be accomplished.  I know that aggressive surgery has been attempted for these cancers with portal vein thrombus.  I am not sure if the surgeons in town would feel that resection could be accomplished after therapy.  Again, my recommendations would be:  1.  Obtain a CT scan of the chest. 2.  Interventional radiology to consider TACE 3.  Dietary consult to try to optimize his nutritional intake.  Since he is on the family practice service, I will defer to his primary team to put in the orders.  Lattie Haw, MD  Oswaldo Milian 12:2

## 2021-01-15 NOTE — Evaluation (Signed)
Occupational Therapy Evaluation Patient Details Name: Jim Little MRN: [id number] DOB: 12-16-1945 Today's Date: 01/15/2021    History of Present Illness 75 yo gentleman with history of cirrhosis, hypertension, urinary retention due to severe BPH, and splenomegaly presenting with abdominal pain and portal vein thrombosis. Also found hepatic mass and biopsy pending.   Clinical Impression   PTA, pt lives with spouse and young children that pt assists in caring for. Pt reports using quad cane for mobility, able to complete ADLs without physical assist. Pt presents now with deficits in strength, standing balance, endurance and cognition (? Difficult to fully assess cognition with use of Swahili interpreter). Pt sitting EOB finishing breakfast on entry, overall min guard for sit to stand and short mobility in room. Pt initially used quad cane, then left it behind and completed mobility with noted balance deficits but declined need for AD. Pt requires Setup for UB ADLs and min guard for LB ADLs. Anticipate pt to progress well enough to require no OT services on discharge, but would benefit from skilled services at acute level to maximize independence/safety with ADLs/mobility. Plan to further maximize safety with ADLs (assess if pt has had falls at home) and increase endurance with ADL mobility.     Follow Up Recommendations  No OT follow up;Supervision - Intermittent    Equipment Recommendations  None recommended by OT    Recommendations for Other Services       Precautions / Restrictions Precautions Precautions: Fall Restrictions Weight Bearing Restrictions: No      Mobility Bed Mobility               General bed mobility comments: sitting EOB on entry    Transfers Overall transfer level: Needs assistance Equipment used: Quad cane;None Transfers: Sit to/from Stand Sit to Stand: Min guard         General transfer comment: min guard for sit to stand using quad cane initially,  then left quad cane behind and ambulated unsteadily/slowly in room without AD    Balance Overall balance assessment: Needs assistance Sitting-balance support: Feet supported;No upper extremity supported Sitting balance-Leahy Scale: Good     Standing balance support: No upper extremity supported;During functional activity Standing balance-Leahy Scale: Fair Standing balance comment: fair static standing though limitations noted with mobility without AD                           ADL either performed or assessed with clinical judgement   ADL Overall ADL's : Needs assistance/impaired Eating/Feeding: Independent;Sitting Eating/Feeding Details (indicate cue type and reason): independent sitting EOB for breakfast Grooming: Min guard;Standing   Upper Body Bathing: Set up;Sitting   Lower Body Bathing: Min guard;Sit to/from stand   Upper Body Dressing : Set up;Sitting   Lower Body Dressing: Min guard;Sit to/from stand   Toilet Transfer: Min guard;Ambulation   Toileting- Clothing Manipulation and Hygiene: Min guard;Sit to/from stand       Functional mobility during ADLs: Min guard General ADL Comments: Decreased awaress of safety/deficits, unsteadiness on feet (progressed without AD use) and decreased endurance     Vision Patient Visual Report:  (to be further assessed) Vision Assessment?: No apparent visual deficits     Perception     Praxis      Pertinent Vitals/Pain Pain Assessment: Faces Faces Pain Scale: No hurt Pain Intervention(s): Monitored during session     Hand Dominance Right   Extremity/Trunk Assessment Upper Extremity Assessment Upper Extremity Assessment: Overall [facility]  for tasks assessed   Lower Extremity Assessment Lower Extremity Assessment: Defer to PT evaluation   Cervical / Trunk Assessment Cervical / Trunk Assessment: Kyphotic   Communication Communication Communication: Prefers language other than English;Interpreter utilized    Cognition Arousal/Alertness: Awake/alert Behavior During Therapy: Flat affect;Impulsive Overall Cognitive Status: Difficult to assess Area of Impairment: Safety/judgement;Awareness;Problem solving                         Safety/Judgement: Decreased awareness of safety;Decreased awareness of deficits Awareness: Emergent Problem Solving: Slow processing;Requires verbal cues;Requires tactile cues;Decreased initiation;Difficulty sequencing General Comments: Pt with questionable safety awareness deficits (speaks Swahili so difficult to determine). reports using quad cane for mobility but then leaves it behind during mobility with therapists. Pt with decreased awarenes of deficits, reports unable to stand upright due to pain in bottom region then leg feeling heavy after walking but still declined use of cane   General Comments  Question if pt has had hx of falls at home, unable to ask this session. MD entering and assessing pt - left pt up in recliner with MD    Exercises     Shoulder Instructions      Home Living Family/patient expects to be discharged to:: Private residence Living Arrangements: Spouse/significant other;Children Available Help at Discharge: Family Type of Home: House       Home Layout: Two level               Home Equipment: [place] - quad;Shower seat   Additional Comments: Has a disabled child that he cares for. reports many young children in the home, all 10 years and under. Has older children that live in [place]. Reports plan to move from current home to purchase a new home - plans to have bedroom on main level      Prior Functioning/Environment Level of Independence: Independent with assistive device(s)        Comments: Use of quad cane for mobility in home/outside in community. Reports able to complete ADLs (uses shower chair), cares for young children in the home        OT Problem List: Decreased strength;Decreased activity  tolerance;Impaired balance (sitting and/or standing);Decreased cognition;Decreased safety awareness;Decreased knowledge of use of DME or AE      OT Treatment/Interventions: Self-care/ADL training;Therapeutic exercise;DME and/or AE instruction;Therapeutic activities;Balance training;Patient/family education    OT Goals(Current goals can be found in the care plan section) Acute Rehab OT Goals Patient Stated Goal: have snacks in between meals to assist with blood draws OT Goal Formulation: With patient Time For Goal Achievement: 01/29/21 Potential to Achieve Goals: Good ADL Goals Pt Will Perform Grooming: with modified independence;standing Pt Will Perform Lower Body Bathing: with modified independence;sitting/lateral leans;sit to/from stand Pt Will Perform Lower Body Dressing: with modified independence;sitting/lateral leans;sit to/from stand Pt Will Transfer to Toilet: with modified independence;ambulating Pt/caregiver will Perform Home Exercise Program: Increased strength;Both right and left upper extremity;With theraband;With written HEP provided;With Supervision Additional ADL Goal #1: Pt to increase standing activity tolerance during ADLs to > 7 min to improve endurance/decrease fall risk  OT Frequency: Min 2X/week   Barriers to D/C:            Co-evaluation PT/OT/SLP Co-Evaluation/Treatment: Yes Reason for Co-Treatment: For patient/therapist safety;To address functional/ADL transfers;Other (comment) (comprehensive session with use of stratus interpreter)   OT goals addressed during session: ADL's and self-care      AM-PAC OT "6 Clicks" Daily Activity     Outcome Measure  Help from another person eating meals?: None Help from another person taking care of personal grooming?: A Little Help from another person toileting, which includes using toliet, bedpan, or urinal?: A Little Help from another person bathing (including washing, rinsing, drying)?: A Little Help from another  person to put on and taking off regular upper body clothing?: A Little Help from another person to put on and taking off regular lower body clothing?: A Little 6 Click Score: 19   End of Session Nurse Communication: Mobility status  Activity Tolerance: Patient tolerated treatment well Patient left: in chair;with call bell/phone within reach;with chair alarm set;Other (comment) (with MD present)  OT Visit Diagnosis: Unsteadiness on feet (R26.81);Other abnormalities of gait and mobility (R26.89);Muscle weakness (generalized) (M62.81);Other symptoms and signs involving cognitive function                Time: 8366-2947 OT Time Calculation (min): 18 min Charges:  OT General Charges $OT Visit: 1 Visit OT Evaluation $OT Eval Moderate Complexity: 1 Mod  Malachy Chamber, OTR/L Acute Rehab Services Office: [phone]  Layla Maw 01/15/2021, 9:53 AM

## 2021-01-15 NOTE — Telephone Encounter (Signed)
   Telephone encounter was:  Successful.  01/15/2021 Name: Zekiah Caruth MRN: [id number] DOB: 03/09/1946  Chrsitopher Wik is a 75 y.o. year old male who is a primary care patient of [name], MD . The community resource team was consulted for assistance with Housing  Care guide performed the following interventions: Spoke with Honor Loh, RN [place] regarding patient housing needs, they have found a home but have not signed a lease yet.  Follow Up Plan:  No further follow up planned at this time. The patient has been provided with needed resources.  Nika Yazzie, AAS Paralegal, [place] . Embedded Care Coordination [facility]  Care Management  [address], [place] [address] ??[email or web address]  ?? [phone]   [email or web address]

## 2021-01-16 ENCOUNTER — Telehealth: Payer: Self-pay

## 2021-01-16 ENCOUNTER — Other Ambulatory Visit: Payer: Self-pay | Admitting: Family Medicine

## 2021-01-16 DIAGNOSIS — C220 Liver cell carcinoma: Secondary | ICD-10-CM | POA: Diagnosis not present

## 2021-01-16 LAB — BASIC METABOLIC PANEL
Anion gap: 8 (ref 5–15)
BUN: 6 mg/dL — ABNORMAL LOW (ref 8–23)
CO2: 26 mmol/L (ref 22–32)
Calcium: 8.9 mg/dL (ref 8.9–10.3)
Chloride: 103 mmol/L (ref 98–111)
Creatinine, Ser: 0.94 mg/dL (ref 0.61–1.24)
GFR, Estimated: >60 mL/min (ref >60)
Glucose, Bld: 114 mg/dL — ABNORMAL HIGH (ref 70–99)
Potassium: 3.9 mmol/L (ref 3.5–5.1)
Sodium: 137 mmol/L (ref 135–145)

## 2021-01-16 MED ORDER — MILK AND MOLASSES ENEMA
1.0000 | Freq: Once | RECTAL | Status: AC
  Administered 2021-01-16: 240 mL via RECTAL
  Filled 2021-01-16 (×2): qty 240

## 2021-01-16 MED ORDER — HYPROMELLOSE (GONIOSCOPIC) 2.5 % OP SOLN: 1.0000 [drp] | Freq: Four times a day (QID) | OPHTHALMIC | 0 refills | Status: DC | PRN

## 2021-01-16 MED ORDER — BENZOCAINE 10 % MT GEL: Freq: Three times a day (TID) | OROMUCOSAL | 0 refills | Status: DC | PRN

## 2021-01-16 MED ORDER — DOCUSATE SODIUM 100 MG PO CAPS: 100.0000 mg | ORAL_CAPSULE | Freq: Every day | ORAL | 0 refills | Status: DC | PRN

## 2021-01-16 NOTE — Discharge Summary (Shared)
[facility] Discharge Summary  Patient name: Jim Little Medical record number: [id number] Date of birth: 01/21/1946 Age: 75 y.o. Gender: male Date of Admission: 01/13/2021  Date of Discharge: 01/16/2021 Admitting Physician: [name], MD  Primary Care Provider: [name], MD Consultants: GI  Indication for Hospitalization: Inpatient anticoagulation for suspected portal vein thrombosis  Discharge Diagnoses/Problem List:  Hepatocellular carcinoma Cirrhosis Hypertension Thrombocytopenia Urinary retention History of H. Pylori Bilateral cataracts Chronic back pain Splenomegaly  Disposition: Home  Discharge Condition: Stable  Discharge Exam:  Temp:  [98.1 F (36.7 C)-98.7 F (37.1 C)] 98.7 F (37.1 C) (02/22 0908) Pulse Rate:  [69-77] 69 (02/22 0908) Resp:  [18-20] 19 (02/22 0908) BP: (138-167)/(79-84) 167/84 (02/22 0908) SpO2:  [99 %-100 %] 99 % (02/22 0908)   Physical Exam General: Awake, alert, oriented, no acute distress Respiratory: Unlabored respirations, speaking in full sentences, no respiratory distress Extremities: Moving all extremities spontaneously Neuro: Cranial nerves II through X grossly intact Psych: Normal insight and judgement  Brief Hospital Course:  Jim Little is a 75 y.o. male presenting for direct admission for anticoagulation in setting of portal vein thrombosis. PMH is significant for hepatic mass, cirrhosis, HTN, h/o H. Pylori, urinary retention, thrombocytopenia, and b/l cataracts.   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  Patient direct admitted for anticoagulation due to multitude of risk factors. Patient hypertensive on admission otherwise VSS. Heparin drip was started. Hgb and plts normal on  admission, INR mildly elevated to 1.3. AST mildly elevated to 62, ALT WNL.  MRI abdomen (2/18) demonstrated large infiltrating mass of right hepatic lobe consistent with hepatocellular carcinoma, LI-RADS 5 along with advanced cirrhotic changes of liver and borderline splenomegaly.  Liver ultrasound with Doppler (2/20) confirmed right portal vein and right hepatic vein occlusion.  CT chest (2/21) negative for evidence of thoracic metastases. AFP 11,904.0, up from 560.0 eight months ago.  IR consulted for liver biopsy for diagnosis, however IR believed imaging was diagnostic of hepatocellular carcinoma and that oncology would not require biopsy.  Oncology (Dr. Marin Olp) consulted, recommended TACE procedure as outpatient.  Oncology declined to continue anticoagulation, saying that what was previously thought to be portal vein thrombus was likely significant tumor burden.  Heparin drip discontinued at that time. Patient discharged home in stable condition with close follow-up with PCP and oncology. Dr. Marin Olp (onc) to send patient to IR outpatient clinic for TACE procedure.   Issues for Follow Up:  1. Oncology follow-up with Dr. Marin Olp, recommends TACE procedure for presumptive hepatocellular carcinoma 2. IR clinic for TACE procedure as outpatient 3. H. Pylori: Patient had bx (+) H. Pylori in October 2021 and previously treated with triple therapy at that time. Patient tested positive again on 12/04/20 and did not complete triple therapy treatment. Protonix discontinued on discharge.   Significant Procedures: None  Significant Labs and Imaging:  Recent Labs  Lab 01/13/21 2146 01/14/21 0753 01/15/21 0320  WBC 4.9 4.3 4.5  HGB 12.3* 12.8* 12.8*  HCT 36.7* 37.1* 38.5*  PLT 154 153 163   Recent Labs  Lab 01/13/21 2146 01/14/21 0753 01/15/21 0320 01/16/21 0257  NA 138 139 136 137  K 3.8 4.2 4.0 3.9  CL 103 103 102 103  CO2 28 30 27 26   GLUCOSE 136* 100* 100* 114*  BUN 7* 6* 5* 6*  CREATININE  0.95 0.88 0.96 0.94  CALCIUM 8.9 9.3 9.0 8.9  ALKPHOS 110 107 108  --   AST 62* 57* 48*  --   ALT 20 21 20   --   ALBUMIN 3.2* 3.2* 3.1*  --     Significant labs:  AFP tumor marker (2/20) 11,904.0, up from 560.0 eight months ago.   Iron panel (2/21): Iron 91, TIBC 234 (low), saturation ratio 29%, UIBC 143.  PT/INR (2/21) 16.0, 1.3 respectively.  Hepatic function panel (2/19): Albumin 3.2, AST 62, ALT 20, alk phos 110   MRI ABDOMEN WITHOUT AND WITH CONTRAST 01/12/2021  TECHNIQUE: Multiplanar multisequence MR imaging of the abdomen was performed both before and after the administration of intravenous contrast.  CONTRAST:  85m MULTIHANCE GADOBENATE DIMEGLUMINE 529 MG/ML IV SOLN  COMPARISON:  MRI 06/11/2020  FINDINGS: Examination is limited by significant breathing motion artifact.  Lower chest: Mild cardiac enlargement. No pericardial effusion. No obvious pulmonary lesions or pleural effusions.  Hepatobiliary: Advanced cirrhotic changes involving the liver. 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 I do not see any definite left hepatic lobe lesions. This area is also diffusion positive.  The gallbladder is unremarkable.  No common bile duct dilatation.  Pancreas: No mass, inflammation or ductal dilatation. Moderate pancreatic atrophy.  Spleen: Borderline splenomegaly. Splenic length is 14 cm. No focal lesions.  Adrenals/Urinary Tract: The adrenal glands and kidneys are grossly normal. No worrisome renal lesions.  Stomach/Bowel: Stomach, duodenum, visualized small bowel and visualized colon are grossly normal.  Vascular/Lymphatic: The aorta and branch vessels are patent. No obvious adenopathy.  Other:  No ascites or abdominal wall hernia.  Musculoskeletal: No  significant bony findings.  IMPRESSION: 1. Advanced cirrhotic changes involving the liver. There is a large infiltrating mass versus numerous confluent masses involving the right hepatic lobe and consistent with hepatocellular carcinoma. Associated thrombosed middle and portal veins. No definite left hepatic lobe lesions. LI-RADS 5. 2. Borderline splenomegaly. 3. No ascites or abdominal wall hernia.   DUPLEX ULTRASOUND OF LIVER 01/14/2021  TECHNIQUE: Color and duplex Doppler ultrasound was performed to evaluate the hepatic in-flow and out-flow vessels.  COMPARISON:  MR 01/12/2021  FINDINGS: Portal Vein Velocities  Main:  29 cm/sec  Right:  Thrombosed  Left:  24 cm/sec  Hepatic Vein Velocities  Right: Not visualized, likely thrombosed.  Middle:  30 cm/sec  Left:  110 cm/sec  Hepatic Artery Velocity:  99 cm/sec  Splenic Vein Velocity:  16 cm/sec  Varices: None visualized  Ascites: None visualized  Cirrhotic changes of the liver with nodular contour and heterogeneous echotexture. Ultrasound demonstrates multiple small hyperechoic foci. The largest measured on the left 1 cm. The ultrasound correlate of the right-sided liver mass on MRI is ill-defined heterogeneously echoic soft tissue, without well-defined margin.  Splenic volume estimated 250 cc  IMPRESSION: Directed duplex of the hepatic vasculature confirms right portal vein and right hepatic vein occlusion, presumably from tumor thrombus given the right-sided liver tumor on MRI. The ultrasound correlate of the previously demonstrated right liver tumor is heterogeneous tissue with ill-defined margin.  In addition to the right-sided tumor, there are several small hyperechoic foci of approximately 1 cm, potentially additional HArtesiafoci versus regenerative nodules.  Cirrhosis and splenomegaly.    CT CHEST WITH CONTRAST 01/15/2021  TECHNIQUE: Multidetector CT  imaging of the chest was  performed during intravenous contrast administration.  CONTRAST:  67m OMNIPAQUE IOHEXOL 300 MG/ML  SOLN  COMPARISON:  MR abdomen, 01/12/2021, CT chest, 07/22/2018  FINDINGS: Cardiovascular: No significant vascular findings. Normal heart size. No pericardial effusion.  Mediastinum/Nodes: No enlarged mediastinal, hilar, or axillary lymph nodes. Thyroid gland, trachea, and esophagus demonstrate no significant findings.  Lungs/Pleura: Definitively benign calcified small nodule of the peripheral right lower lobe (series 7, image 84). Stable, definitively benign small nodules of the left major fissure (series 7, image 64). Unchanged elevation of the left hemidiaphragm. No pleural effusion or pneumothorax.  Upper Abdomen: No acute abnormality. Cirrhotic morphology of the liver with ill-defined, heterogeneous mass of the right lobe of the liver and occlusion of the right portal vein (series 3, image 133).  Musculoskeletal: No chest wall mass or suspicious bone lesions identified.  IMPRESSION: 1. No evidence of metastatic disease in the chest. 2. Cirrhotic morphology of the liver with ill-defined, heterogeneous mass of the right lobe of the liver and occlusion of the right portal vein, in keeping with known hepatocellular carcinoma, better demonstrated by recent MR.  Results/Tests Pending at Time of Discharge: None pending  Discharge Medications:  Allergies as of 01/16/2021      Reactions   Pork-derived Products    Religious reasons.Patient is a muslim      Medication List    STOP taking these medications   famotidine 20 MG tablet Commonly known as: PEPCID   pantoprazole 40 MG tablet Commonly known as: PROTONIX     TAKE these medications   acetaminophen 500 MG tablet Commonly known as: TYLENOL Take 500 mg by mouth 2 (two) times daily as needed for mild pain.   amLODipine 5 MG tablet Commonly known as: NORVASC Take 1 tablet (5 mg total) by mouth daily.    azelastine 0.05 % ophthalmic solution Commonly known as: OPTIVAR Place 1 drop into both eyes 2 (two) times daily.   benzocaine 10 % mucosal gel Commonly known as: ORAJEL Use as directed in the mouth or throat 3 (three) times daily as needed for mouth pain.   docusate sodium 100 MG capsule Commonly known as: Colace Take 1 capsule (100 mg total) by mouth daily as needed.   fluticasone 50 MCG/ACT nasal spray Commonly known as: FLONASE Place 2 sprays into both nostrils daily.   folic acid 1 MG tablet Commonly known as: FOLVITE Take 1 tablet (1 mg total) by mouth daily.   hydroxypropyl methylcellulose / hypromellose 2.5 % ophthalmic solution Commonly known as: ISOPTO TEARS / GONIOVISC Place 1 drop into both eyes 4 (four) times daily as needed for dry eyes.   lactulose 10 g packet Commonly known as: CEPHULAC Take 1 packet (10 g total) by mouth as needed (constipation). Dissolve in 8 ounces of water   Olopatadine HCl 0.2 % Soln Apply 1 drop to eye daily.   tamsulosin 0.4 MG Caps capsule Commonly known as: FLOMAX Take 1 capsule (0.4 mg total) by mouth daily. Please pack and mail to home       Discharge Instructions: Please refer to Patient Instructions section of EMR for full details.  Patient was counseled important signs and symptoms that should prompt return to medical care, changes in medications, dietary instructions, activity restrictions, and follow up appointments.   Follow-Up Appointments:  Follow-up Information    BMartyn Malay MD Follow up.   Specialty: Family Medicine Why: Dr. BOwens Shark your primary care doctor, will make plans to follow up  with you. Dr. Owens Shark, daktari wako wa huduma ya msingi, atafanya mipango ya [place]. Contact information: [place]  [address] [phone]        [name], MD. Schedule an appointment as soon as possible for a visit.   Specialty: Oncology Why: Dr. Marin Olp is your cancer doctor. He will be  planning the cancer treatment, including the surgical procedure we talked about. He will reach out to you to make an appointment. Dr. Marin Olp ni daktari wako wa saratani. [place] akipanga matibabu ya saratani Contact information: [place] [address] [phone]               Ezequiel Essex, MD 01/17/2021, 2:23 PM PGY-1, [place] Upper-Level Resident Addendum   I have independently interviewed and examined the patient. I have discussed the above with the original author and agree with their documentation.  Please see also any attending notes.    Carollee Leitz MD PGY-2, [place] [facility] 01/17/2021 3:35 PM  [place] Service pager: [phone] (text pages welcome through [place])

## 2021-01-16 NOTE — Discharge Instructions (Signed)
Dear Jim Little, Jim Little,  Thank you for letting us participate in your care. You were hospitalized for a clot in your liver and diagnosed with liver cancer called hepatocellular carcinoma.  Asante kwa kuturuhusu kushiriki [place]. Ulilazwa hospitalini kwa Jim Little ini lako na ukagunduliwa kuwa na saratani ya ini inayoitwa hepatocellular carcinoma.  POST-HOSPITAL & CARE INSTRUCTIONS [facility]OSPITAL[facility] NA UTUNZI 1. Dr. Owens Shark, your primary care doctor, will make plans to follow up with you. Dr. Owens Shark, daktari wako wa huduma ya msingi, atafanya mipango ya [place]. 2. Dr. Marin Olp is your new cancer doctor. His office will call you to make an appointment. Dr. Marin Olp will be the one planning your cancer treatment, including the surgical procedure we talked about. Dk. Marin Olp ndiye daktari wako mpya wa saratani. Ofisi yake itakupigia simu kupanga miadi. Dk. Marin Olp ndiye atakayepanga matibabu yako ya saratani, ikiwa ni pamoja na utaratibu wa upasuaji tuliozungumzia. 3. We will send home medicines to help you have a bowel movement. Use them as directed. Tutakutumia dawa nyumbani kukusaidia kupata haja kubwa. Zitumie kama ulivyoelekezwa. 4. We will send home eye drops. Drop a Little of the liquid into your eyes as needed for relief. You may use it three to four times daily. Tutatuma matone ya macho nyumbani. Weka kioevu kidogo machoni pako kama inavyohitajika kwa ajili ya misaada. Unaweza kutumia mara tatu hadi nne kwa siku. 5. We will send home tooth numbing cream. Rub a Little bit of a cream at the bottom of the tooth near the gum on the teeth that are hurting. You may use this up to three times daily. It will make the skin touches, including your gums and the side of the tongue numb and tingly. This is normal. [place]uma cream ya meno nyumbani. Piga cream kidogo chini ya jino karibu na ufizi kwenye meno ambayo yanaumiza. Unaweza kutumia hadi mara tatu kwa siku.  Itafanya ngozi [place], [place] ni pamoja na ufizi wako na upande wa ulimi na ganzi. Hii ni kawaida. 6. Go to your follow up appointments (listed below). Nenda kwenye miadi yako ya kufuatilia (iliyoorodheshwa hapa chini).    DOCTOR'S APPOINTMENT   UTEUZI WA DAKTARI No future appointments.  Follow-up Information    [name], MD Follow up.   Specialty: Family Medicine Why: Dr. Owens Shark, your primary care doctor, will make plans to follow up with you. Dr. Owens Shark, daktari wako wa huduma ya msingi, atafanya mipango ya [place]. Contact information: [name] [address] [phone]        [name], MD. Schedule an appointment as soon as possible for a visit.   Specialty: Oncology Why: Dr. Marin Olp is your cancer doctor. He will be planning the cancer treatment, including the surgical procedure we talked about. He will reach out to you to make an appointment. Dr. Marin Olp ni daktari wako wa saratani. Atakuwa akipanga matibabu ya saratani Contact information: [place] [address] [phone]               Take care and be well! Jihadharini na uwe mzima!  [place] Team Timu Harm[place] wa [place] wa [place] kumb[place] [address] [place], [place] [address] [phone]

## 2021-01-16 NOTE — Plan of Care (Signed)
  Problem: Education: Goal: Knowledge of General Education information will improve Description: Including pain rating scale, medication(s)/side effects and non-pharmacologic comfort measures Outcome: Adequate for Discharge   

## 2021-01-16 NOTE — Progress Notes (Signed)
Discharge instructions (including medications) discussed with and copy provided to patient/caregiver 

## 2021-01-16 NOTE — Telephone Encounter (Signed)
1700 hrs  Patient discharged to go home.Attempts to call family for pick  up unsuccessful.Contacted [facility] transportation services and provided a ride home.  Honor Loh RN BSn PCCN  [facility] [phone]

## 2021-01-16 NOTE — Progress Notes (Signed)
Family Medicine Teaching Service Daily Progress Note Intern Pager: [phone]  Patient name: Jim Little Medical record number: [id number] Date of birth: [birth year] Age: 75 y.o. Gender: male  Primary Care Provider: [name], MD Consultants: GI Code Status: Full  Pt Overview and Major Events to Date:  2/19 admitted for portal vein thrombosis  Assessment and Plan: Jim Little a 75 y.o.malepresenting for direct admission for anticoagulation in setting of portal vein thrombosis. PMH is significant forhepatic mass, cirrhosis, HTN, h/o H. Pylori, urinary retention, thrombocytopenia, and bilateral cataracts.   Portal Vein Thrombosis  Hepatic Cirrhosis  Hepatic Mass likely [facility] Patient reports continued postprandial fullness and abdominal bloating.  No bowel movement yet since admission.  Yesterday IR consulted for possible liver biopsy, however they declined citing diagnostic level imaging.  Oncology consulted, Dr. Marin Little recommended TACE procedure.  Confirmed with Dr. Pascal Little, IR, that this procedure would take place outpatient. -Discharge with close follow-up by Dr. Marin Little -Dr. Marin Little to send patient to Dr. Pascal Little in IR clinic for TACE procedure when appropriate -DC heparin drip -DC Protonix   HTN: Stable, chronic BP last 24 hours 138-167/79-84, pulses 69-77.  -Continue amlodipine 5 mg  H/o Urinary Retention -Continue Flomax 0.4 mg daily -Continue bladder scan every shift  H. Pylori Patient had bx (+) H. Pylori in October 2021 and previously treated with triple therapy at that time. Patient tested positive again on 12/04/20 and did not complete triple therapy treatment. Will address as outpatient. -DC Protonix on discharge  FEN/GI:  Heart healthy diet PPx:  Heparin drip   Status is: Inpatient   Remains inpatient appropriate because:Ongoing diagnostic testing needed not appropriate for outpatient work up, IV treatments appropriate due to intensity of illness or  inability to take PO and Inpatient level of care appropriate due to severity of illness   Dispo:  Patient From: Home  Planned Disposition: Home with Health Care Svc  Expected discharge date: 01/16/2021  Medically stable for discharge: No       Subjective:  Patient found laying comfortably in bed this morning talking on the phone.  Utilized video interpreter service this morning.  Patient reports continued postprandial fullness and bloating, along with discomfort.  No bowel movement overnight, has not had a bowel movement since admission.  Also reports continued dry eyes and tooth pain.  Believes the Orajel helped the tooth pain.  Has not yet received eyedrops.  Discussed plan for discharge with oncology follow-up and IR procedure as outpatient.  All questions answered.  Patient is grateful for the care he received from the team here.  Objective: Temp:  [98.1 F (36.7 C)-98.7 F (37.1 C)] 98.7 F (37.1 C) (02/22 0908) Pulse Rate:  [69-77] 69 (02/22 0908) Resp:  [18-20] 19 (02/22 0908) BP: (138-167)/(79-84) 167/84 (02/22 0908) SpO2:  [99 %-100 %] 99 % (02/22 0908)   Physical Exam General: Awake, alert, oriented, no acute distress Respiratory: Unlabored respirations, speaking in full sentences, no respiratory distress Extremities: Moving all extremities spontaneously Neuro: Cranial nerves II through X grossly intact Psych: Normal insight and judgement   Laboratory: Recent Labs  Lab 01/13/21 2146 01/14/21 0753 01/15/21 0320  WBC 4.9 4.3 4.5  HGB 12.3* 12.8* 12.8*  HCT 36.7* 37.1* 38.5*  PLT 154 153 163   Recent Labs  Lab 01/13/21 2146 01/14/21 0753 01/15/21 0320 01/16/21 0257  NA 138 139 136 137  K 3.8 4.2 4.0 3.9  CL 103 103 102 103  CO2 28 30 27 26   BUN 7* 6*  5* 6*  CREATININE 0.95 0.88 0.96 0.94  CALCIUM 8.9 9.3 9.0 8.9  PROT 7.4 7.5 7.5  --   BILITOT 0.7 0.5 1.1  --   ALKPHOS 110 107 108  --   ALT 20 21 20   --   AST 62* 57* 48*  --   GLUCOSE 136* 100* 100*  114*    Imaging/Diagnostic Tests: None last 24 hours.   Jim Essex, MD 01/16/2021, 4:10 PM PGY-1, [place] Intern pager: [phone], text pages welcome

## 2021-01-17 ENCOUNTER — Telehealth: Payer: Self-pay | Admitting: *Deleted

## 2021-01-17 NOTE — Telephone Encounter (Signed)
Transition Care Management Unsuccessful Follow-up Telephone Call  Date of discharge and from where:  01/16/2021 - [facility]  Attempts:  1st Attempt  Reason for unsuccessful TCM follow-up call:  Left voice message via Swahili language interpreter

## 2021-01-18 ENCOUNTER — Telehealth: Payer: Self-pay

## 2021-01-18 ENCOUNTER — Other Ambulatory Visit: Payer: Self-pay | Admitting: Hematology & Oncology

## 2021-01-18 DIAGNOSIS — R160 Hepatomegaly, not elsewhere classified: Secondary | ICD-10-CM

## 2021-01-18 NOTE — Telephone Encounter (Signed)
Transition Care Management Unsuccessful Follow-up Telephone Call  Date of discharge and from where:  01/16/2021 [facility]  Attempts:  2nd Attempt  Reason for unsuccessful TCM follow-up call:  Left voice message via Swahili language interpreter

## 2021-01-18 NOTE — Telephone Encounter (Signed)
I contacted patient for follow up after discharge. He does not understand how to take his medication.. We tried to go through medications via video with difficulties. I will make a home visit tomorrow.  Honor Loh RN BSn PCCN  [facility] Nurse [phone]

## 2021-01-19 NOTE — Congregational Nurse Program (Signed)
  Dept: [place] Nurse Program Note  Date of Encounter: 01/19/2021  Past Medical History: Past Medical History:  Diagnosis Date  . BPH (benign prostatic hyperplasia)   . Cirrhosis ([place])   . Hypertension   . Splenomegaly     Encounter Details: Visited with patient at his home.He is preparing to vacate his current home after an eviction notice. He is also recovering after resent hospital admission.Family is experiencing a lot of stress at the moment.  I have gone through discharge instructions after he verbalized that he is not taking any medications.I have requested his son to pick up orajel.colace and eyedrops OTC and that patient should continue taking norvasc,flomax and lactulose as previously ordered.  Honor Loh RN BSn PCCN  [facility] Nurse [phone]

## 2021-01-19 NOTE — Telephone Encounter (Signed)
Transition Care Management Unsuccessful Follow-up Telephone Call  Date of discharge and from where:  01/16/2021 [facility]  Attempts:  3rd Attempt  Reason for unsuccessful TCM follow-up call:  Left voice message via Swahili language interpreter

## 2021-01-24 ENCOUNTER — Telehealth: Payer: Self-pay | Admitting: Family Medicine

## 2021-01-24 NOTE — Telephone Encounter (Signed)
Do have Vickie's number? I will call her to discuss how to contact family--only one son typically answers the phone and a daughter in law answer. I can also try to reach family. [name] Chief Technology Officer) is out until next week.   Thanks, Terisa Starr, MD  [facility] Medicine Teaching Service

## 2021-01-24 NOTE — Telephone Encounter (Signed)
Jim Little from [place] is calling to let doctor Jim Little know she has tried several times to reach patient the emergency contact is the same as home number, VM is full and no one answers, also relayed message to Jim Little that she has been unable to reach patient. Thanks!

## 2021-01-25 NOTE — Telephone Encounter (Signed)
Called and spoke with Jim Little.  IR appointment March 15th at 1045, arrive 15 minutes early, [address].   Patient will be charged $75 if he no shows.   Called [place] to discuss. Swahili interpreter used throughout entire encounter. He is at work, [employer] not working. Will call again next week.  [name], MD  Family Medicine Teaching Service

## 2021-01-31 ENCOUNTER — Other Ambulatory Visit: Payer: Medicaid Other

## 2021-01-31 MED ORDER — AMLODIPINE BESYLATE 5 MG PO TABS: 5.0000 mg | ORAL_TABLET | Freq: Every day | ORAL | 3 refills | Status: DC

## 2021-01-31 MED ORDER — TAMSULOSIN HCL 0.4 MG PO CAPS: 0.4000 mg | ORAL_CAPSULE | Freq: Every day | ORAL | 3 refills | Status: AC

## 2021-01-31 NOTE — Telephone Encounter (Signed)
Called with Swahili interpreter. Reached patient but interpreter cut off.  Called back, discussed appointment with patient and his daughter in law.  Will work with RN [name] and [name] to arrange transport.   [name], MD  Family Medicine Teaching Service

## 2021-01-31 NOTE — Addendum Note (Signed)
Addended byOwens Shark, Shawnette Augello on: 01/31/2021 02:08 PM   Modules accepted: Orders

## 2021-02-01 NOTE — Telephone Encounter (Signed)
RNCM spoke with Dr Owens Shark and Honor Loh RN is in contact with the family and will be helping them with transportation. RNCM will be kept in the loop if any further assistance is needed.  [name] RN, BSN, [facility] Care Management Coordinator [place] Phone: [phone] I Fax: [phone]

## 2021-02-03 ENCOUNTER — Telehealth: Payer: Self-pay

## 2021-02-03 NOTE — Telephone Encounter (Signed)
Called patient to remind him of appointment with [place] on 02/06/21 @ 11.00am. Informed patient that Transportation will be provided by [employer]. Patient verbalized understanding.  Honor Loh RN BSn PCCN  [facility] Nurse [phone]

## 2021-02-06 ENCOUNTER — Encounter: Payer: Self-pay | Admitting: *Deleted

## 2021-02-06 ENCOUNTER — Ambulatory Visit
Admission: RE | Admit: 2021-02-06 | Discharge: 2021-02-06 | Disposition: A | Discharge status: Home / Self Care | Payer: Medicaid Other | Source: Ambulatory Visit | Attending: Hematology & Oncology | Admitting: Hematology & Oncology

## 2021-02-06 ENCOUNTER — Ambulatory Visit: Payer: Self-pay

## 2021-02-06 ENCOUNTER — Telehealth: Payer: Self-pay

## 2021-02-06 DIAGNOSIS — R160 Hepatomegaly, not elsewhere classified: Secondary | ICD-10-CM

## 2021-02-06 HISTORY — PX: IR RADIOLOGIST EVAL & MGMT: IMG5224

## 2021-02-06 NOTE — Chronic Care Management (AMB) (Signed)
  Care Management  Collaboration  Note  02/06/2021 Name: Jim Little MRN: [id number] DOB: 04/09/1946  Jim Little is a 75 y.o. year old male who is a primary care patient of [name], MD. The CCM team was consulted for Collaboration  reference chronic disease management and or care coordination needs.    Assessment: RNCM was called to help clarify the address for the patient's appointment for [facility]..   Intervention: RNCM called and spoke with Crystal at [place] to clarify the correct address for the patient's procedure today.  [address].  Patient to be there at 1045 am.   Patient was not interviewed or contacted during this encounter.    CCM RNCM collaborated with Bronwen Betters.  Conducted brief assessment, recommendations and relevant information discussed.   Follow up Plan: RNCM will remain available for 90 days.  If no further needs are assessed at this time RNCM will be removed from care team.  Collaboration with [name], MD regarding development and update of comprehensive plan of care as evidenced by provider attestation and co-signature Review of patient past medical history, allergies, medications, and health status, including review of pertinent consultant reports was performed as part of comprehensive evaluation and provision of care management/care coordination services.      [name], RN

## 2021-02-06 NOTE — Consult Note (Signed)
Chief Complaint: Patient was seen in consultation today management of hepatocellular carcinoma at the request of Ennever,Peter R  Referring Physician(s): Ennever,Peter R  History of Present Illness: Jim Little is a 75 y.o. male with an infiltrative mass in the right hepatic lobe and markedly elevated alpha-fetoprotein level.  Patient's clinical picture is most compatible with hepatocellular carcinoma.  Patient is from [facility] and is accompanied by a Advertising account planner.  Patient complains of intermittent right abdominal pain.  He uses a cane for ambulation.  He says that he is able to accomplish most of his activities of daily living.  He complains of constipation.  He says that his urination has not been the same since he was in the hospital and thinks it may been related to the urinary catheter.  He denies hematuria.  He denies fevers or chills.  He says that he easily gets full after eating small amounts of food.   Past Medical History:  Diagnosis Date  . BPH (benign prostatic hyperplasia)   . Cirrhosis ([place])   . Hypertension   . Splenomegaly     Past Surgical History:  Procedure Laterality Date  . BIOPSY  08/22/2020   Procedure: BIOPSY;  Surgeon: Ronnette Juniper, MD;  Location: [facility];  Service: Gastroenterology;;  . COLONOSCOPY WITH PROPOFOL N/A 08/22/2020   Procedure: COLONOSCOPY WITH PROPOFOL;  Surgeon: Ronnette Juniper, MD;  Location: [facility];  Service: Gastroenterology;  Laterality: N/A;  . ESOPHAGOGASTRODUODENOSCOPY (EGD) WITH PROPOFOL N/A 08/22/2020   Procedure: ESOPHAGOGASTRODUODENOSCOPY (EGD) WITH PROPOFOL;  Surgeon: Ronnette Juniper, MD;  Location: [facility];  Service: Gastroenterology;  Laterality: N/A;  . IR RADIOLOGIST EVAL & MGMT  02/06/2021  . UPPER GASTROINTESTINAL ENDOSCOPY  03/2018   [place] --candidiasis plus esophageal varices grade 2.  Treated with fluconazole and status post esophageal banding.  Marland Kitchen US ECHOCARDIOGRAPHY  10/2016   per overseas  records from [place] - LVEF 68%; mildly calcified aortic valve, normal function otherwise    Allergies: Pork-derived products  Medications: Prior to Admission medications   Medication Sig Start Date End Date Taking? Authorizing Provider  acetaminophen (TYLENOL) 500 MG tablet Take 500 mg by mouth 2 (two) times daily as needed for mild pain.    [provider]  amLODipine (NORVASC) 5 MG tablet Take 1 tablet (5 mg total) by mouth daily. 01/31/21   [name], MD  azelastine (OPTIVAR) 0.05 % ophthalmic solution Place 1 drop into both eyes 2 (two) times daily. Patient not taking: No sig reported 01/31/20   [name], MD  benzocaine (ORAJEL) 10 % mucosal gel Use as directed in the mouth or throat 3 (three) times daily as needed for mouth pain. 01/16/21   Matilde Haymaker, MD  docusate sodium (COLACE) 100 MG capsule Take 1 capsule (100 mg total) by mouth daily as needed. 01/16/21 01/16/22  Matilde Haymaker, MD  fluticasone (FLONASE) 50 MCG/ACT nasal spray Place 2 sprays into both nostrils daily. Patient not taking: No sig reported 03/06/20   [name], MD  folic acid (FOLVITE) 1 MG tablet Take 1 tablet (1 mg total) by mouth daily. Patient not taking: No sig reported 05/15/20   Milus Banister C, DO  hydroxypropyl methylcellulose / hypromellose (ISOPTO TEARS / GONIOVISC) 2.5 % ophthalmic solution Place 1 drop into both eyes 4 (four) times daily as needed for dry eyes. 01/16/21   Matilde Haymaker, MD  lactulose (CEPHULAC) 10 g packet Take 1 packet (10 g total) by mouth as needed (constipation). Dissolve  in 8 ounces of water 11/27/20   [name], MD  Olopatadine HCl 0.2 % SOLN Apply 1 drop to eye daily. Patient not taking: No sig reported 03/06/20   [name], MD  tamsulosin (FLOMAX) 0.4 MG CAPS capsule Take 1 capsule (0.4 mg total) by mouth daily. Please pack and mail to home 01/31/21   [name], MD     No family history on file.  Social History   Socioeconomic History  .  Marital status: Married    Spouse name: Not on file  . Number of children: 2  . Years of education: Not on file  . Highest education level: Not on file  Occupational History  . Not on file  Tobacco Use  . Smoking status: Never Smoker  . Smokeless tobacco: Never Used  Substance and Sexual Activity  . Alcohol use: Never  . Drug use: Not on file  . Sexual activity: Not on file  Other Topics Concern  . Not on file  Social History Narrative  . Not on file   Social Determinants of Health   Financial Resource Strain: Not on file  Food Insecurity: Not on file  Transportation Needs: Not on file  Physical Activity: Not on file  Stress: Not on file  Social Connections: Not on file     Review of Systems  Constitutional: Positive for appetite change. Negative for chills and fever.  Respiratory: Negative.   Cardiovascular: Negative.   Gastrointestinal: Positive for abdominal pain and constipation.  Genitourinary: Positive for difficulty urinating.    Vital Signs: BP (!) 188/88 (BP Location: Right Arm)   Pulse 78   SpO2 99%   Physical Exam Vitals reviewed.  Cardiovascular:     Rate and Rhythm: Normal rate and regular rhythm.  Pulmonary:     Effort: Pulmonary effort is normal.     Breath sounds: Normal breath sounds.  Abdominal:     General: Abdomen is flat. There is no distension.     Palpations: Abdomen is soft.     Tenderness: There is no abdominal tenderness.  Neurological:     Mental Status: He is alert.       Imaging: CT CHEST W CONTRAST  Result Date: 01/15/2021 CLINICAL DATA:  Chest staging, hepatocellular carcinoma EXAM: CT CHEST WITH CONTRAST TECHNIQUE: Multidetector CT imaging of the chest was performed during intravenous contrast administration. CONTRAST:  25mL OMNIPAQUE IOHEXOL 300 MG/ML  SOLN COMPARISON:  MR abdomen, 01/12/2021, CT chest, 07/22/2018 FINDINGS: Cardiovascular: No significant vascular findings. Normal heart size. No pericardial effusion.  Mediastinum/Nodes: No enlarged mediastinal, hilar, or axillary lymph nodes. Thyroid gland, trachea, and esophagus demonstrate no significant findings. Lungs/Pleura: Definitively benign calcified small nodule of the peripheral right lower lobe (series 7, image 84). Stable, definitively benign small nodules of the left major fissure (series 7, image 64). Unchanged elevation of the left hemidiaphragm. No pleural effusion or pneumothorax. Upper Abdomen: No acute abnormality. Cirrhotic morphology of the liver with ill-defined, heterogeneous mass of the right lobe of the liver and occlusion of the right portal vein (series 3, image 133). Musculoskeletal: No chest wall mass or suspicious bone lesions identified. IMPRESSION: 1. No evidence of metastatic disease in the chest. 2. Cirrhotic morphology of the liver with ill-defined, heterogeneous mass of the right lobe of the liver and occlusion of the right portal vein, in keeping with known hepatocellular carcinoma, better demonstrated by recent MR. Electronically Signed   By: Eddie Candle M.D.   On: 01/15/2021 22:05  MR ABDOMEN WWO CONTRAST  Result Date: 01/12/2021 CLINICAL DATA:  Cirrhosis. EXAM: MRI ABDOMEN WITHOUT AND WITH CONTRAST TECHNIQUE: Multiplanar multisequence MR imaging of the abdomen was performed both before and after the administration of intravenous contrast. CONTRAST:  67mL MULTIHANCE GADOBENATE DIMEGLUMINE 529 MG/ML IV SOLN COMPARISON:  MRI 06/11/2020 FINDINGS: Examination is limited by significant breathing motion artifact. Lower chest: Mild cardiac enlargement. No pericardial effusion. No obvious pulmonary lesions or pleural effusions. Hepatobiliary: Advanced cirrhotic changes involving the liver. 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 I do not see any definite  left hepatic lobe lesions. This area is also diffusion positive. The gallbladder is unremarkable.  No common bile duct dilatation. Pancreas: No mass, inflammation or ductal dilatation. Moderate pancreatic atrophy. Spleen: Borderline splenomegaly. Splenic length is 14 cm. No focal lesions. Adrenals/Urinary Tract: The adrenal glands and kidneys are grossly normal. No worrisome renal lesions. Stomach/Bowel: Stomach, duodenum, visualized small bowel and visualized colon are grossly normal. Vascular/Lymphatic: The aorta and branch vessels are patent. No obvious adenopathy. Other:  No ascites or abdominal wall hernia. Musculoskeletal: No significant bony findings. IMPRESSION: 1. Advanced cirrhotic changes involving the liver. There is a large infiltrating mass versus numerous confluent masses involving the right hepatic lobe and consistent with hepatocellular carcinoma. Associated thrombosed middle and portal veins. No definite left hepatic lobe lesions. LI-RADS 5. 2. Borderline splenomegaly. 3. No ascites or abdominal wall hernia. Electronically Signed   By: Marijo Sanes M.D.   On: 01/12/2021 16:36   US LIVER DOPPLER  Result Date: 01/15/2021 CLINICAL DATA:  75 year old male with known cirrhosis, liver mass on recent MRI. Prior biopsy 07/14/2020 EXAM: DUPLEX ULTRASOUND OF LIVER TECHNIQUE: Color and duplex Doppler ultrasound was performed to evaluate the hepatic in-flow and out-flow vessels. COMPARISON:  MR 01/12/2021 FINDINGS: Portal Vein Velocities Main:  29 cm/sec Right:  Thrombosed Left:  24 cm/sec Hepatic Vein Velocities Right: Not visualized, likely thrombosed. Middle:  30 cm/sec Left:  110 cm/sec Hepatic Artery Velocity:  99 cm/sec Splenic Vein Velocity:  16 cm/sec Varices: None visualized Ascites: None visualized Cirrhotic changes of the liver with nodular contour and heterogeneous echotexture. Ultrasound demonstrates multiple small hyperechoic foci. The largest measured on the left 1 cm. The ultrasound  correlate of the right-sided liver mass on MRI is ill-defined heterogeneously echoic soft tissue, without well-defined margin. 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 The ultrasound correlate of the previously demonstrated right liver tumor is heterogeneous tissue with ill-defined margin. In addition to the right-sided tumor, there are several small hyperechoic foci of approximately 1 cm, potentially additional [place] foci versus regenerative nodules. Cirrhosis and splenomegaly Electronically Signed   By: Corrie Mckusick D.O.   On: 01/15/2021 05:10   IR Radiologist Eval & Mgmt  Result Date: 02/06/2021 Please refer to notes tab for details about interventional procedure. (Op Note)   Labs:  CBC: Recent Labs    12/04/20 1137 01/13/21 2146 01/14/21 0753 01/15/21 0320  WBC 4.7 4.9 4.3 4.5  HGB 14.5 12.3* 12.8* 12.8*  HCT 38.7 36.7* 37.1* 38.5*  PLT 178 154 153 163    COAGS: Recent Labs    05/10/20 2148 05/11/20 0437 07/14/20 1030 01/13/21 2146 01/15/21 0320  INR  --  1.6* 1.2 1.3* 1.3*  APTT 39*  --   --   --   --     BMP: Recent Labs    05/13/20 0345 05/14/20 0455 06/01/20 1222 12/04/20 1137 01/13/21 2146 01/14/21 0753 01/15/21 0320 01/16/21 0257  NA 136 138 136 142 138 139 136 137  K 3.6 3.7 4.6 3.7 3.8 4.2 4.0 3.9  CL 108 108 104 105 103 103 102 103  CO2 22 25 26 24 28 30 27 26   GLUCOSE 95 101* 97 80 136* 100* 100* 114*  BUN 15 10 6* 6* 7* 6* 5* 6*  CALCIUM 8.2* 8.1* 8.6* 8.9 8.9 9.3 9.0 8.9  CREATININE 0.83 0.47* 1.16 1.00 0.95 0.88 0.96 0.94  GFRNONAA >60 >60 >60 72 >60 >60 >60 >60  GFRAA >60 >60 >60 84  --   --   --   --     LIVER FUNCTION TESTS: Recent Labs    12/04/20 1137 01/13/21 2146 01/14/21 0753 01/15/21 0320  BILITOT 0.7 0.7 0.5 1.1  AST 53* 62* 57* 48*  ALT 27 20 21 20    ALKPHOS 154* 110 107 108  PROT 7.7 7.4 7.5 7.5  ALBUMIN 4.0 3.2* 3.2* 3.1*    TUMOR MARKERS: AFP = 11,904 (01/14/2021) AFP = 560 (05/13/2020)  Assessment and Plan:  75 year old with cirrhosis and a large infiltrative mass in the right hepatic lobe.  Alpha-fetoprotein level is markedly elevated and the findings are compatible with hepatocellular carcinoma.  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  Presumably the patient is not a surgical candidate although I do not see a surgical consultation note.  With regards to liver directed therapy, the large infiltrative lesion is too large for percutaneous ablation.  Best liver directed therapy option would be transcatheter radioembolization with Y 90 but, unfortunately, the patient is on Medicaid and this is not a treatment option.  Transcatheter chemoembolization or bland embolization have limited roles in this case because of the right portal vein occlusion and risks of ischemia and necrosis with treatment.  At this point, I will defer to medical oncology with regards to systemic therapy options.  We could reassess for liver directed therapy after a few months of systemic therapy to see if anything changes with regards to right portal vein involvement.  Again, the patient is a poor candidate for transcatheter chemoembolization or bland embolization due to the right portal vein occlusion and Y 90 radioembolization is not an option with Medicaid at this time.  I assume the lesion is too large and infiltrative for SBRT but we could have radiation oncology evaluate the patient as well.  We will have patient follow-up with medical oncology.  Thank you for this interesting consult.  I greatly enjoyed meeting Jim Little and look forward to participating in their care.  A copy of this report was sent to the requesting provider on this date.  Electronically Signed: Burman Riis 02/06/2021, 1:48 PM   I spent a total of  50  minutes  in face to face in clinical consultation, greater than 50% of which was counseling/coordinating care for hepatocellular carcinoma.

## 2021-02-06 NOTE — Telephone Encounter (Signed)
Arranged transportation for patient to scheduled procedure today at [place] [phone]

## 2021-02-06 NOTE — Telephone Encounter (Signed)
Patient called me requesting transportation back home from [facility]. Transportation scheduled with [employer].  Honor Loh RN BSn PCCN  [facility] [phone]

## 2021-02-07 ENCOUNTER — Telehealth: Payer: Self-pay | Admitting: Family Medicine

## 2021-02-07 ENCOUNTER — Telehealth: Payer: Self-pay

## 2021-02-07 NOTE — Telephone Encounter (Signed)
Patient and family moved to a new address. I have called pharmacy and updated address.  Honor Loh RN BSn PCCN  [facility] [phone]

## 2021-02-07 NOTE — Telephone Encounter (Signed)
Spoke with Engineer, site at [employer] [employer] about coordinating care for patient. She will work to schedule initial visit, will communicate with RN and PCP about this to facilitate transport etc.  Called IR to discuss potential for appealing medicaid denial of IR therapies, left message.  [name], MD  Family Medicine Teaching Service

## 2021-02-08 ENCOUNTER — Other Ambulatory Visit (HOSPITAL_COMMUNITY): Payer: Self-pay | Admitting: Diagnostic Radiology

## 2021-02-08 ENCOUNTER — Telehealth: Payer: Self-pay

## 2021-02-08 ENCOUNTER — Telehealth: Payer: Self-pay | Admitting: Family Medicine

## 2021-02-08 DIAGNOSIS — C220 Liver cell carcinoma: Secondary | ICD-10-CM

## 2021-02-08 NOTE — Telephone Encounter (Signed)
Error

## 2021-02-08 NOTE — Telephone Encounter (Signed)
Scheduled transportation scheduled for 02/09/2021 at [facility].  Honor Loh RN BSn PCCN  [facility] [phone]

## 2021-02-08 NOTE — Progress Notes (Addendum)
[place] [phone]   [phone] Office Note  REFERRING PHYSICIAN: Dr. Anselm Pancoast  REASON FOR CONSULTATION:  Hepatocellular Carcinoma   HPI Jim Little is a 75 y.o. male with a past medical history significant for cirrhosis (unclear etiology as he is negative for hepatitis B and C), hepatic mass first noted since 6/21 s/p biopsy 8/21 with pathology significant for consistent with changes of cirrhosis with regenerative nodules and degenerative atypia, hypertension, severe BPH, H. Pylori, and splenomegaly is referred to the clinic for suspicious hepatocellular carcinoma/porta vein thrombus  The patient was admitted to the hospital in June 2021 for proteus bacteremia and toxic metabolic encephalopathy. The patient was seen inpatient by Dr. Therisa Doyne, gastroenterologist due to imaging noted morphologic changes of the liver consistent with cirrhosis. Therefore, outpatient work-up was initiated. The patient had an extensive workup including serology, USG, MRI, and US dopplers. He had a liver biopsy due to elevated AFP/liver lesions (07/18/20) in which the pathology was showed changes are suggestive of cirrhosis with regenerative nodules and degenerative atypia. He also had an EGD screening for varices and colonoscopy screening (08/22/20). The colonoscopy was unremarkable. 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   The patient had an outpatient MRI of the liver performed on 01/12/21 which advanced cirrhotic changes involving the liver, a large infiltrating mass versus numerous confluent masses involving the right hepatic lobe consistent with hepatocellular carcinoma with associated thrombosed middle and portal veins, LI-RADS 5, and borderline splenomegaly. The patient was admitted to the hospital by the family medicine service for the porta vein thrombus. The patient as started on heparin for the portal vein thrombosis. 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 In addition to the right-sided tumor, there are several small hyperechoic foci of approxi-1 cm which are potentially additional [place] foci versus regenerative nodules.  The ultrasound also confirms cirrhosis and splenomegaly.  AFP was drawn 01/14/2021 is markedly elevated at 11,904.0. Regarding symptoms, the patient was endorsing intermitted right sided abdominal pain, constipation, and early satiety.   While admitted, oncology was consulted and the patient was seen by Dr. Marin Olp. Dr. Marin Olp recommended CT scan of the chest to complete the staging workup which did not show evidence of metastatic disease in the chest.  Dr. Marin Olp did not recommend anticoagulation since the thrombus is tumor thrombus. He also recommended dietary consultation since the patient reported weight loss. Lastly, he recommended referral to IR to consider TACE.   The patient was seen by IR, Dr. Anselm Pancoast on 02/06/21.  Dr. Anselm Pancoast noted that the patient is a poor candidate for   transcatheter chemoembolization or bland embolization due to the right portal vein occlusion. The patient would be a candidate for Y 90 radioembolization; however, Medicaid would not cover this procedure. Therefore, the patient was referred to medical oncology for further evaluation and management of his condition. Of note, the patient's PCP has requested for IR to perform an appear with Medicaid to cover Y-90. This is tentatively scheduled for next month but it is unclear if this will be approved.   The atient was seen in the clinic by himself today but [facility], Jim Server, was available by phone. Jim Little states the patient's PCP has been trying to get home health assistance as well as PT without success. The patient usually ambulates with a cane but has had several falls due to his  weakness. The patient's main concerns  today were related to constipation (last bowel movement ~2 weeks ago), decreased appetite secondary to constipation, and right upper quadrant abdominal discomfort.  The patient is supposed to be taking lactulose for his constipation which is preferred due to the cirrhosis; however, he is not always compliant with his medications.  The patient's nurse has tried to help him in the past and states that he is compliant for a few days and then becomes uncompliant.  When he obtains refills for his medication, he cannot read the labels is unsure how to take his medications.  Of note, he also did not take his antihypertensive medication today as well.  The patient endorses weight loss but he is unable to quantify for me, she has lost over what timeframe.  He reports decreased appetite be because he is concerned about his constipation.  He reports fatigue.  Denies any abnormal bleeding or bruising including epistaxis, gingival bleeding, hemoptysis, hematemesis, melena, hematochezia, or hematuria.  He denies any nausea or vomiting.  He denies any jaundice.  He localizes his abdominal pain to the right upper quadrant.  He states that the pain is generally there all the time but waxes and wanes in severity.  Overall, he states that his pain is moderate.  He has not tried taking any over-the-counter medications for his pain.   The patient is a refugee originally from the [place].  He used to work as a Chief Financial Officer.  The patient lives in a house with his family which consists of his wife, 2 sons, his daughter in-law, 2 grandchildren, and his 81 other younger children. The youngest child is 3 with cerebral palsy. The patient is the primary caregiver of his granddaughter with cerebral palsy. His wife who works at a [employer] and comes home from work every day at 6 PM.  The patient's daughter is disabled due to a seizure disorder or mental illness.  The patient denies any alcohol use.     HPI  Past  Medical History:  Diagnosis Date  . BPH (benign prostatic hyperplasia)   . Cirrhosis ([place])   . Hypertension   . Splenomegaly     Past Surgical History:  Procedure Laterality Date  . BIOPSY  08/22/2020   Procedure: BIOPSY;  Surgeon: Ronnette Juniper, MD;  Location: [facility];  Service: Gastroenterology;;  . COLONOSCOPY WITH PROPOFOL N/A 08/22/2020   Procedure: COLONOSCOPY WITH PROPOFOL;  Surgeon: Ronnette Juniper, MD;  Location: [facility];  Service: Gastroenterology;  Laterality: N/A;  . ESOPHAGOGASTRODUODENOSCOPY (EGD) WITH PROPOFOL N/A 08/22/2020   Procedure: ESOPHAGOGASTRODUODENOSCOPY (EGD) WITH PROPOFOL;  Surgeon: Ronnette Juniper, MD;  Location: [facility];  Service: Gastroenterology;  Laterality: N/A;  . IR RADIOLOGIST EVAL & MGMT  02/06/2021  . UPPER GASTROINTESTINAL ENDOSCOPY  03/2018   [place] --candidiasis plus esophageal varices grade 2.  Treated with fluconazole and status post esophageal banding.  Marland Kitchen US ECHOCARDIOGRAPHY  10/2016   per overseas records from [place] - LVEF 68%; mildly calcified aortic valve, normal function otherwise    No family history on file.  Social History Social History   Tobacco Use  . Smoking status: Never Smoker  . Smokeless tobacco: Never Used  Substance Use Topics  . Alcohol use: Never    Allergies  Allergen Reactions  . Pork-Derived Products     Religious reasons.Patient is a muslim    Current Outpatient Medications  Medication Sig Dispense Refill  . acetaminophen (TYLENOL) 500 MG tablet Take 500  mg by mouth 2 (two) times daily as needed for mild pain.    Marland Kitchen amLODipine (NORVASC) 5 MG tablet Take 1 tablet (5 mg total) by mouth daily. (Patient not taking: Reported on 02/09/2021) 90 tablet 3  . azelastine (OPTIVAR) 0.05 % ophthalmic solution Place 1 drop into both eyes 2 (two) times daily. (Patient not taking: No sig reported) 6 mL 12  . benzocaine (ORAJEL) 10 % mucosal gel Use as directed in the mouth or throat 3 (three) times daily as  needed for mouth pain. 7.1 g 0  . docusate sodium (COLACE) 100 MG capsule Take 1 capsule (100 mg total) by mouth daily as needed. 60 capsule 0  . fluticasone (FLONASE) 50 MCG/ACT nasal spray Place 2 sprays into both nostrils daily. (Patient not taking: No sig reported) 16 g 6  . folic acid (FOLVITE) 1 MG tablet Take 1 tablet (1 mg total) by mouth daily. (Patient not taking: No sig reported) 90 tablet 3  . hydroxypropyl methylcellulose / hypromellose (ISOPTO TEARS / GONIOVISC) 2.5 % ophthalmic solution Place 1 drop into both eyes 4 (four) times daily as needed for dry eyes. 15 mL 0  . lactulose (CEPHULAC) 10 g packet Take 1 packet (10 g total) by mouth as needed (constipation). Dissolve in 8 ounces of water 30 each 3  . Olopatadine HCl 0.2 % SOLN Apply 1 drop to eye daily. (Patient not taking: No sig reported) 2.5 mL 3  . tamsulosin (FLOMAX) 0.4 MG CAPS capsule Take 1 capsule (0.4 mg total) by mouth daily. Please pack and mail to home 90 capsule 3   No current facility-administered medications for this visit.    REVIEW OF SYSTEMS:   Review of Systems  Constitutional: Positive for fatigue, generalized weakness, appetite change, and weight loss.  Negative for chills and fever. HENT: Negative for mouth sores, nosebleeds, sore throat and trouble swallowing.   Eyes: Negative for eye problems and icterus.  Respiratory: Negative for cough, hemoptysis, shortness of breath and wheezing.   Cardiovascular: Negative for chest pain and leg swelling.  Gastrointestinal: Positive for right upper quadrant discomfort and constipation.  Negative for diarrhea, nausea and vomiting.  Genitourinary: Positive for urinary retention secondary to significant BPH.  Negative for bladder incontinence, difficulty urinating, dysuria, frequency and hematuria.   Musculoskeletal: Negative for back pain, gait problem, neck pain and neck stiffness.  Skin: Negative for itching and rash.  Neurological: Negative for dizziness,  extremity weakness, gait problem, headaches, light-headedness and seizures.  Hematological: Negative for adenopathy. Does not bruise/bleed easily.  Psychiatric/Behavioral: Negative for confusion, depression and sleep disturbance. The patient is not nervous/anxious.     PHYSICAL EXAMINATION:  Blood pressure (!) 181/91, pulse 66, temperature 98.1 F (36.7 C), temperature source Tympanic, resp. rate 18, height 5\' 6"  (1.676 m), weight 142 lb 8 oz (64.6 kg), SpO2 100 %.  ECOG PERFORMANCE STATUS: 2-3  Physical Exam  Constitutional: Oriented to person, place, and time and thin ill-appearing male and in no distress.  HENT:  Head: Normocephalic and atraumatic.  Mouth/Throat: Oropharynx is clear and moist. No oropharyngeal exudate.  Eyes: Conjunctivae are normal. Right eye exhibits no discharge. Left eye exhibits no discharge. No scleral icterus.  Neck: Normal range of motion. Neck supple.  Cardiovascular: Normal rate, regular rhythm, normal heart sounds and intact distal pulses.   Pulmonary/Chest: Effort normal and breath sounds normal. No respiratory distress. No wheezes. No rales.  Abdominal: Soft. Bowel sounds are normal.  Hepatomegaly noted.  Right upper quadrant tenderness.  Musculoskeletal: Normal range of motion. Exhibits no edema.  Lymphadenopathy:    No cervical adenopathy.  Neurological: Alert and oriented to person, place, and time. Exhibits muscle wasting.  Patient was a to assist for transferring.  Ambulates with a cane. Skin: Skin is warm and dry. No rash noted. Not diaphoretic. No erythema. No pallor.  Psychiatric: Mood, memory and judgment normal.  Vitals reviewed.  LABORATORY DATA: Lab Results  Component Value Date   WBC 4.5 01/15/2021   HGB 12.8 (L) 01/15/2021   HCT 38.5 (L) 01/15/2021   MCV 98.0 01/15/2021   PLT 163 01/15/2021      Chemistry      Component Value Date/Time   NA 137 01/16/2021 0257   NA 142 12/04/2020 1137   K 3.9 01/16/2021 0257   CL 103  01/16/2021 0257   CO2 26 01/16/2021 0257   BUN 6 (L) 01/16/2021 0257   BUN 6 (L) 12/04/2020 1137   CREATININE 0.94 01/16/2021 0257      Component Value Date/Time   CALCIUM 8.9 01/16/2021 0257   ALKPHOS 108 01/15/2021 0320   AST 48 (H) 01/15/2021 0320   ALT 20 01/15/2021 0320   BILITOT 1.1 01/15/2021 0320   BILITOT 0.7 12/04/2020 1137       RADIOGRAPHIC STUDIES: CT CHEST W CONTRAST  Result Date: 01/15/2021 CLINICAL DATA:  Chest staging, hepatocellular carcinoma EXAM: CT CHEST WITH CONTRAST TECHNIQUE: Multidetector CT imaging of the chest was performed during intravenous contrast administration. CONTRAST:  107mL OMNIPAQUE IOHEXOL 300 MG/ML  SOLN COMPARISON:  MR abdomen, 01/12/2021, CT chest, 07/22/2018 FINDINGS: Cardiovascular: No significant vascular findings. Normal heart size. No pericardial effusion. Mediastinum/Nodes: No enlarged mediastinal, hilar, or axillary lymph nodes. Thyroid gland, trachea, and esophagus demonstrate no significant findings. Lungs/Pleura: Definitively benign calcified small nodule of the peripheral right lower lobe (series 7, image 84). Stable, definitively benign small nodules of the left major fissure (series 7, image 64). Unchanged elevation of the left hemidiaphragm. No pleural effusion or pneumothorax. Upper Abdomen: No acute abnormality. Cirrhotic morphology of the liver with ill-defined, heterogeneous mass of the right lobe of the liver and occlusion of the right portal vein (series 3, image 133). Musculoskeletal: No chest wall mass or suspicious bone lesions identified. IMPRESSION: 1. No evidence of metastatic disease in the chest. 2. Cirrhotic morphology of the liver with ill-defined, heterogeneous mass of the right lobe of the liver and occlusion of the right portal vein, in keeping with known hepatocellular carcinoma, better demonstrated by recent MR. Electronically Signed   By: Eddie Candle M.D.   On: 01/15/2021 22:05   MR ABDOMEN WWO CONTRAST  Result  Date: 01/12/2021 CLINICAL DATA:  Cirrhosis. EXAM: MRI ABDOMEN WITHOUT AND WITH CONTRAST TECHNIQUE: Multiplanar multisequence MR imaging of the abdomen was performed both before and after the administration of intravenous contrast. CONTRAST:  18mL MULTIHANCE GADOBENATE DIMEGLUMINE 529 MG/ML IV SOLN COMPARISON:  MRI 06/11/2020 FINDINGS: Examination is limited by significant breathing motion artifact. Lower chest: Mild cardiac enlargement. No pericardial effusion. No obvious pulmonary lesions or pleural effusions. Hepatobiliary: Advanced cirrhotic changes involving the liver. 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 I do not see any definite left hepatic lobe lesions. This area is also diffusion positive. The gallbladder is unremarkable.  No common bile duct dilatation. Pancreas: No mass, inflammation or ductal dilatation. Moderate pancreatic atrophy.  Spleen: Borderline splenomegaly. Splenic length is 14 cm. No focal lesions. Adrenals/Urinary Tract: The adrenal glands and kidneys are grossly normal. No worrisome renal lesions. Stomach/Bowel: Stomach, duodenum, visualized small bowel and visualized colon are grossly normal. Vascular/Lymphatic: The aorta and branch vessels are patent. No obvious adenopathy. Other:  No ascites or abdominal wall hernia. Musculoskeletal: No significant bony findings. IMPRESSION: 1. Advanced cirrhotic changes involving the liver. There is a large infiltrating mass versus numerous confluent masses involving the right hepatic lobe and consistent with hepatocellular carcinoma. Associated thrombosed middle and portal veins. No definite left hepatic lobe lesions. LI-RADS 5. 2. Borderline splenomegaly. 3. No ascites or abdominal wall hernia. Electronically Signed   By: Marijo Sanes M.D.   On: 01/12/2021 16:36    US LIVER DOPPLER  Result Date: 01/15/2021 CLINICAL DATA:  75 year old male with known cirrhosis, liver mass on recent MRI. Prior biopsy 07/14/2020 EXAM: DUPLEX ULTRASOUND OF LIVER TECHNIQUE: Color and duplex Doppler ultrasound was performed to evaluate the hepatic in-flow and out-flow vessels. COMPARISON:  MR 01/12/2021 FINDINGS: Portal Vein Velocities Main:  29 cm/sec Right:  Thrombosed Left:  24 cm/sec Hepatic Vein Velocities Right: Not visualized, likely thrombosed. Middle:  30 cm/sec Left:  110 cm/sec Hepatic Artery Velocity:  99 cm/sec Splenic Vein Velocity:  16 cm/sec Varices: None visualized Ascites: None visualized Cirrhotic changes of the liver with nodular contour and heterogeneous echotexture. Ultrasound demonstrates multiple small hyperechoic foci. The largest measured on the left 1 cm. The ultrasound correlate of the right-sided liver mass on MRI is ill-defined heterogeneously echoic soft tissue, without well-defined margin. 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 The ultrasound correlate of the previously demonstrated right liver tumor is heterogeneous tissue with ill-defined margin. In addition to the right-sided tumor, there are several small hyperechoic foci of approximately 1 cm, potentially additional [place] foci versus regenerative nodules. Cirrhosis and splenomegaly Electronically Signed   By: Corrie Mckusick D.O.   On: 01/15/2021 05:10   IR Radiologist Eval & Mgmt  Result Date: 02/06/2021 Please refer to notes tab for details about interventional procedure. (Op Note)   ASSESSMENT: This is a very pleasant 75 year old male with likely unresectable hepatocellular carcinoma  1) Hepatocellular Carcinoma, unresectable, stage IIIA  -MRI liver 01/12/21 Advanced cirrhotic changes involving the liver. Large  infiltrating mass versus numerous confluent masses involving the right hepatic lobe and consistent with hepatocellular carcinoma. Associated thrombosed middle and portal veins. No definite left hepatic lobe lesions. LI-RADS 5.  -AFP (01/14/21)  11,904.0.  -CT of the chest did not show metastatic disease to the lungs. Staging workup complete. Adequate liver and kidney function. -Unclear etiology of cirrhosis, followed by Dr. Therisa Doyne. Hep B surface antigen non reactive (05/15/20), ANA negative, Hep C ab non reactive. No alcohol abuse.  -Evaluated by IR 02/06/21 the patient deemed poor candidate for transcatheter chemoembolization or bland embolization due to the right portal vein occlusion.  Y 90 radioembolization is not an option with Medicaid at this time. IR may try appeal with Medicaid to cover Y-90 and tentatively scheduled for 03/22/21 but may not get approved.  -The patient was seen with Dr. Burr Medico today. Dr. Burr Medico had a lengthy discussion with the patient about his current condition and recommended treatment options. She discussed that his Lifescape is unresectable due to the size of the tumor and underlying cirrhosis. She discussed first  line systemic treatment for unresectable HCC is Tecentriq and Avastin. However, she also mentioned she would like to discuss with radiation oncology to see if the patient can be considered for treatment with radiotherapy. Should the patient proceed with tecentriq/avastin, the adverse side effects of treatment were discussed including but not limited to immunotherapy mediated skin rash, diarrhea, inflammation of the lung, kidney, liver, thyroid or other endocrine dysfunction. The adverse side effects of Avastin where discussed including hypertension, bleeding, and GI perforation.  -Dr. Burr Medico recommends discussing the patient with radiation oncology to see if he is a candidate for radiotherapy. If he is not a candidate, she will likely proceed with Avastin/Tecentriq.  -She will see the  patient back in 2 weeks for evaluation and for a more detailed discussion about his current condition and official recommended treatment after coordination with radiation oncology.    2) Social Support, Poor Compliance with medications due to language barrier -From the [place], was at a refugee camp in [place] -He speaks Swahili -Has several family members living in single house. He is the primary caretaker for his 65 year old granddaughter with cerebral palsy.  -Has [facility], Jim Little who helps him. Helps explain his medications and how to take them -Previously, he did not qualify for home health/PT.  -Asked Jim Little to help with medication/pills. The patient is not compliant with his medications. Explained the importance of taking his medications as prescribed, particularly the lactulose and anti-hypertensive medication. Discussed optimal BP control required with Avastin as Avastin may cause worsening hypertension.  -Referral to social worker to see if there are any financial or personal assistance programs that he may qualify for.  3) Constipation/decreased appetite/Weight loss -Patient with significant constipation due to not taking lactulose as prescribed -Lactulose preferred for constipation due to cirrhosis. Used to take Doculax and colace in the past -Constipation education given today. Goal is to have a BM daily, but no more than 3 bowel movements daily.  -Dr. Burr Medico instructed the patient to take lactulose every 3 hours until he has a bowel movement. For maintenance, instructed to take lactulose 1-3 time per day in order to achieve 1-2 bowel movements daily -Referral to nutritionist for weight loss  4) Right Upper Quadrant Pain -Has not tried any OTC medication for pain -Due to significant constipation, Dr. Burr Medico recommends Alleve/Ibuprofen as opposed to opioids -If the patient does not have adequate pain control and has improvement in his  significant constipation at his next appointment, Dr. Burr Medico may consider prescribing pain medication   5) Portal vein thrombosis  -Direct admission from 01/13/21-01/16/21  -Patient received heparin drip inpatient -Dr. Marin Olp consulted during admission. He did not recommend anticoagulation since the thrombus is tumor thrombus  6) Cirrhosis  -Followed by Dr. Therisa Doyne from Gastroenterology -Prior work up in 2021 -EGD/Colonoscopy 07/2020   7) Anemia -Mild with Hbg~12 -Iron studies and ferritin 01/15/21 shows iron 91, TIBC 234, Saturation radio 39, and ferritin 150  -Monitor for now.    PLAN: -Referral to social work -Referral to nutrition -Follow up with YF in 2 weeks -YF will discuss patient with rad onc -Constipation education and education on importance of medication compliance   Disclaimer: This note was dictated with voice recognition software. Similar sounding words can inadvertently be transcribed and may not be corrected upon review.   Ahaana Rochette L Chevy Virgo February 09, 2021, 4:16 PM  Addendum  I have seen the patient, examined him. I agree with the assessment and and plan  and have edited the notes.   Mr. Bohr is a 75 yo male with PMH of liver cirrhosis, unclear etiology, hypertension, portal vein thrombosis, presented with rising AFP, and questionable liver mass since July 2021.  He did have liver biopsy in August 2021 which was negative for malignancy.  His recent abdominal MRI from January 12, 2021 showed a large infiltrating mass (7.7cm) versus numerous confluent masses involving the right hepatic lobe which is consistent with he has associated thrombosed middle and the portal vein.  No ascites, EGD from September 2021 showed gastritis, no varices.  He was evaluated for Y 90, but cannot have it due to lack of insurance coverage.  Restaging scan was negative for extrahepatic disease.  His HCC is not resectable due to the size of the tumor and severe liver cirrhosis.  I will  discuss with radiation oncologist Dr. Lisbeth Renshaw in tumor board next week, to see if he is a candidate for radiation. Pt has low health literacy, and has multiple social challenges, I think local therapy is preferred if he is a candidate.  If not, I would offer systemic therapy with Tecentriq and bevacizumab, we reviewed the benefit and side effects with him today.  He is a poor candidate for oral TKI, due to poor compliance.  We discussed pain and constipation management today, we instructed him to take lactulose and Dulcolax.  Plan to see him back in 2 weeks, may need prescription pain medication if he is constipation improves. Will discuss his case in GI tumor board next week. All questions were answered.   Truitt Merle  02/09/2021   Addendum I presented his case in GI tumor board today. Dr. Lisbeth Renshaw does not think he is a candidate for liver radiation (SBRT) due to the large size. Dr. Anselm Pancoast informed me that his insurance has approved Y90 therapy and plan to proceed in mid April. I will hold on systemic therapy now, and will follow up.   Truitt Merle  02/14/2021

## 2021-02-09 ENCOUNTER — Other Ambulatory Visit: Payer: Self-pay

## 2021-02-09 ENCOUNTER — Encounter: Payer: Self-pay | Admitting: Physician Assistant

## 2021-02-09 ENCOUNTER — Encounter: Payer: Self-pay | Admitting: General Practice

## 2021-02-09 ENCOUNTER — Inpatient Hospital Stay: Payer: Medicaid Other | Attending: Physician Assistant | Admitting: Physician Assistant

## 2021-02-09 VITALS — BP 181/91 | HR 66 | Temp 98.1°F | Resp 18 | Ht 66.0 in | Wt 142.5 lb

## 2021-02-09 DIAGNOSIS — D649 Anemia, unspecified: Secondary | ICD-10-CM | POA: Insufficient documentation

## 2021-02-09 DIAGNOSIS — I10 Essential (primary) hypertension: Secondary | ICD-10-CM | POA: Diagnosis not present

## 2021-02-09 DIAGNOSIS — R6881 Early satiety: Secondary | ICD-10-CM | POA: Insufficient documentation

## 2021-02-09 DIAGNOSIS — R634 Abnormal weight loss: Secondary | ICD-10-CM | POA: Diagnosis not present

## 2021-02-09 DIAGNOSIS — R109 Unspecified abdominal pain: Secondary | ICD-10-CM | POA: Insufficient documentation

## 2021-02-09 DIAGNOSIS — Z79899 Other long term (current) drug therapy: Secondary | ICD-10-CM | POA: Insufficient documentation

## 2021-02-09 DIAGNOSIS — R1011 Right upper quadrant pain: Secondary | ICD-10-CM | POA: Diagnosis not present

## 2021-02-09 DIAGNOSIS — K7460 Unspecified cirrhosis of liver: Secondary | ICD-10-CM | POA: Diagnosis not present

## 2021-02-09 DIAGNOSIS — R162 Hepatomegaly with splenomegaly, not elsewhere classified: Secondary | ICD-10-CM | POA: Diagnosis not present

## 2021-02-09 DIAGNOSIS — N400 Enlarged prostate without lower urinary tract symptoms: Secondary | ICD-10-CM | POA: Insufficient documentation

## 2021-02-09 DIAGNOSIS — C220 Liver cell carcinoma: Secondary | ICD-10-CM

## 2021-02-09 DIAGNOSIS — I81 Portal vein thrombosis: Secondary | ICD-10-CM | POA: Insufficient documentation

## 2021-02-09 DIAGNOSIS — K5900 Constipation, unspecified: Secondary | ICD-10-CM | POA: Diagnosis not present

## 2021-02-09 NOTE — Progress Notes (Signed)
Portal CSW Progress Notes  Referral received from C Heilingotter PA, concerned about patient's medication adherence and ability to understand what he needs to do.  Spoke w D [name], RN - Congregational Nurse who is familiar with patient's home situation.  Patient can read [place] and Sw[place]ili - he cannot read medication instructions in [place].  He needs a simplified medication regimen, medications in blister packs if possible, instructions translated into [place] and/or Swahili.  Patient is receiving medications from multiple pharmacy - would be best to come from one pharmacy that can both blister pack and deliver.  Patient has no transportation.  Patient does use [facility] for some prescriptions.  Message sent to his PCP and [facility] PA to ask for their help in addressing this need.  Per Congregational Nurse, patient lives w wife, son, daughter in law, daughter (53 w mental health and seizure disorders w history of running away) as well as 4 young children.  He is the primary caregiver for his 34 year old child who has cerebral palsy.  Wife works during the day and returns home at 6 PM.    SW assessment will be done next week as family/patient have multiple psychosocial needs.   Edwyna Shell, LCSW Clinical Social Worker Phone:  [phone]

## 2021-02-12 ENCOUNTER — Telehealth: Payer: Self-pay | Admitting: Hematology

## 2021-02-12 NOTE — Telephone Encounter (Signed)
Scheduled per 03/18 los, patient has been called and voicemail was left with interprenator.

## 2021-02-13 NOTE — Congregational Nurse Program (Signed)
  Dept: [place] Nurse Program Note  Date of Encounter: 02/13/2021 4pm  Past Medical History: Past Medical History:  Diagnosis Date  . BPH (benign prostatic hyperplasia)   . Cirrhosis ([place])   . Hypertension   . Splenomegaly     Encounter Details: Routine home visit completed. Patient at his residence with spouse and children. He has been using lactulose and reports bowel movements at least daily.He has not been taking amlodipine and Tamsulosin. Patient stated that he is overwhelmed by the many different medications he is supposed to take. Patient, spouse and daughter`s medications were all in one big bad including left over medication. I sorted out his medications and educated him and his wife.I will discard old and leftover medications at [facility] outpatient pharmacy bin.  Honor Loh RN BSn PCCN  [facility] Nurse [phone]

## 2021-02-14 ENCOUNTER — Encounter: Payer: Self-pay | Admitting: Diagnostic Radiology

## 2021-02-14 ENCOUNTER — Other Ambulatory Visit: Payer: Self-pay

## 2021-02-14 NOTE — Progress Notes (Signed)
Patient ID: Jim Little, male   DOB: [birth year], 75 y.o.   MRN: [id number] I saw the patient in consultation for liver directed therapy for [facility].  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 Patient was felt to be a candidate for Y90 radioembolization opposed the embolic treatment with chemoembolization or bland embolization.  However at the time of the visit, we thought the patient's insurance would not cover the Y90 radioembolization.  Fortunately, we have discovered that his insurance will cover this procedure.  Patient has recently seen Dr. Burr Medico in Oncology.  I will reach out to oncology to coordinate care.

## 2021-02-14 NOTE — Progress Notes (Signed)
The proposed treatment discussed in conference is for discussion purposes only and is not a binding recommendation.  The patients have not been physically examined, or presented with their treatment options.  Therefore, final treatment plans cannot be decided.   

## 2021-02-15 ENCOUNTER — Encounter: Payer: Self-pay | Admitting: General Practice

## 2021-02-15 NOTE — Progress Notes (Signed)
[place] CSW Progress Notes  Referral received from PA C Heilingotter due to concerns w medication adherence and knowledge.  Spoke w Congregational Nurse [name] [name] who is familiar with patient and family.  Multiple psychosocial stressors present.  Arrived in [place] May 2019 as a refugee from [place] via camp in [place].  Followed closely by PCP at [facility].  Ms [name] is not actively involved with patient at this point, as refugees are expected to become self sufficient after a time in the [place].  However, she has remained aware of patient's situation and attempts to help as she is able.  Notes that patient is able to reach [place] and [place], but not [place].  This causes confusion re medications.  Irwing [place], [employer], states that they are able to translate as needed into languages that are understood by patient.  Family has no transportation, they are able to access [employer] for [employer].  Will verify that they are enrolled and refer if needed.  Due to communication barriers, CSW [place] plans to see patient in person with interpreter at next visit w oncologist on [facility] 3/20. Closing referral at this time.    Edwyna Shell, LCSW Clinical Social Worker Phone:  [phone]

## 2021-02-16 NOTE — Progress Notes (Signed)
[place]   [phone] [phone]   Clinic Follow up Note   Patient Care Team: [name], MD as PCP - General (Family Medicine) [name], RN as [place] Management Jonnie Finner, RN as Oncology Nurse Navigator Heilingoetter, Tobe Sos, PA-C as Physician Assistant (Physician Assistant) Truitt Merle, MD as Consulting Physician (Oncology)  Date of Service:  02/21/2021  CHIEF COMPLAINT: F/u of [place]   SUMMARY OF ONCOLOGIC HISTORY: Oncology History Overview Note  Cancer Staging Hepatocellular carcinoma [facility]) Staging form: Liver, AJCC 8th Edition - Clinical stage from 02/09/2021: Stage IIIA (cT3, cN0, cM0) - Signed by Truitt Merle, MD on 02/09/2021 Stage prefix: Initial diagnosis    Hepatocellular carcinoma ([place])  05/12/2020 Imaging   MRI Liver  IMPRESSION: 1. Geographic lesion in the inferior medial aspect of the RIGHT hepatic lobe has some imaging features suggesting focal fatty infiltration. However, atypical enhancement pattern. Recommend follow-up contrast MRI in 3 to 6 months to re-evaluate. 2. No additional lesion liver. 3. Morphologic changes consistent with cirrhosis.   01/12/2021 Imaging   MRI Abdomen  IMPRESSION: 1. Advanced cirrhotic changes involving the liver. There is a large infiltrating mass versus numerous confluent masses involving the right hepatic lobe and consistent with hepatocellular carcinoma. Associated thrombosed middle and portal veins. No definite left hepatic lobe lesions. LI-RADS 5. 2. Borderline splenomegaly. 3. No ascites or abdominal wall hernia.     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 The ultrasound correlate of the previously demonstrated right liver tumor is heterogeneous tissue  with ill-defined margin.   In addition to the right-sided tumor, there are several small hyperechoic foci of approximately 1 cm, potentially additional [place] foci versus regenerative nodules.   Cirrhosis and splenomegaly   01/15/2021 Imaging   CT Chest  IMPRESSION: 1. No evidence of metastatic disease in the chest. 2. Cirrhotic morphology of the liver with ill-defined, heterogeneous mass of the right lobe of the liver and occlusion of the right portal vein, in keeping with known hepatocellular carcinoma, better demonstrated by recent MR.   02/09/2021 Initial Diagnosis   Hepatocellular carcinoma ([place])   02/09/2021 Cancer Staging   Staging form: Liver, AJCC 8th Edition - Clinical stage from 02/09/2021: Stage IIIA (cT3, cN0, cM0) - Signed by Truitt Merle, MD on 02/09/2021 Stage prefix: Initial diagnosis      CURRENT THERAPY:  PENDING Y90 in April  INTERVAL HISTORY:  Jim Little is here for a follow up. He presents to the clinic with electronic Swahili interpretor. His Nurse advocate was called to be included in visit. He notes he is okay and doing well. He notes he was feeling RUQ pain, that was not horrible but controlled on pain medication. He had tried to use medication for pain but not sure which one as he cannot read it in [place]. He notes he needs a refill. He notes he manages his medication himself. He notes he has a BM, but not everyday. He did not bring his medications today but will bring them next time. He notes the only person he knows that speaks English is the Nurse/advocate who was sent to him in the hospital. He notes he has her contact information. He notes he cannot understand the medical  bills that was sent to him. He notes when he eats he has early  satiety.    REVIEW OF SYSTEMS:   Constitutional: Denies fevers, chills or abnormal weight loss Eyes: Denies blurriness of vision Ears, nose, mouth, throat, and face: Denies mucositis or sore throat Respiratory: Denies cough,  dyspnea or wheezes Cardiovascular: Denies palpitation, chest discomfort or lower extremity swelling Gastrointestinal:  Denies nausea, heartburn or change in bowel habits (+) RUQ pain  Skin: Denies abnormal skin rashes Lymphatics: Denies new lymphadenopathy or easy bruising Neurological:Denies numbness, tingling or new weaknesses Behavioral/Psych: Mood is stable, no new changes  All other systems were reviewed with the patient and are negative.  MEDICAL HISTORY:  Past Medical History:  Diagnosis Date  . BPH (benign prostatic hyperplasia)   . Cirrhosis ([place])   . Hypertension   . Splenomegaly     SURGICAL HISTORY: Past Surgical History:  Procedure Laterality Date  . BIOPSY  08/22/2020   Procedure: BIOPSY;  Surgeon: Ronnette Juniper, MD;  Location: [facility];  Service: Gastroenterology;;  . COLONOSCOPY WITH PROPOFOL N/A 08/22/2020   Procedure: COLONOSCOPY WITH PROPOFOL;  Surgeon: Ronnette Juniper, MD;  Location: [facility];  Service: Gastroenterology;  Laterality: N/A;  . ESOPHAGOGASTRODUODENOSCOPY (EGD) WITH PROPOFOL N/A 08/22/2020   Procedure: ESOPHAGOGASTRODUODENOSCOPY (EGD) WITH PROPOFOL;  Surgeon: Ronnette Juniper, MD;  Location: [facility] ENDOSCOPY;  Service: Gastroenterology;  Laterality: N/A;  . IR RADIOLOGIST EVAL & MGMT  02/06/2021  . UPPER GASTROINTESTINAL ENDOSCOPY  03/2018   Per [place] records --candidiasis plus esophageal varices grade 2.  Treated with fluconazole and status post esophageal banding.  Marland Kitchen US ECHOCARDIOGRAPHY  10/2016   per overseas records from [place] - LVEF 68%; mildly calcified aortic valve, normal function otherwise    I have reviewed the social history and family history with the patient and they are unchanged from previous note.  ALLERGIES:  is allergic to pork-derived products.  MEDICATIONS:  Current Outpatient Medications  Medication Sig Dispense Refill  . traMADol (ULTRAM) 50 MG tablet Take 1 tablet (50 mg total) by mouth every 12 (twelve) hours as needed for  severe pain. 20 tablet 0  . acetaminophen (TYLENOL) 500 MG tablet Take 500 mg by mouth 2 (two) times daily as needed for mild pain.    Marland Kitchen amLODipine (NORVASC) 5 MG tablet Take 1 tablet (5 mg total) by mouth daily. (Patient not taking: Reported on 02/09/2021) 90 tablet 3  . azelastine (OPTIVAR) 0.05 % ophthalmic solution Place 1 drop into both eyes 2 (two) times daily. (Patient not taking: No sig reported) 6 mL 12  . benzocaine (ORAJEL) 10 % mucosal gel Use as directed in the mouth or throat 3 (three) times daily as needed for mouth pain. 7.1 g 0  . docusate sodium (COLACE) 100 MG capsule Take 1 capsule (100 mg total) by mouth daily as needed. 60 capsule 0  . fluticasone (FLONASE) 50 MCG/ACT nasal spray Place 2 sprays into both nostrils daily. (Patient not taking: No sig reported) 16 g 6  . folic acid (FOLVITE) 1 MG tablet Take 1 tablet (1 mg total) by mouth daily. (Patient not taking: No sig reported) 90 tablet 3  . hydroxypropyl methylcellulose / hypromellose (ISOPTO TEARS / GONIOVISC) 2.5 % ophthalmic solution Place 1 drop into both eyes 4 (four) times daily as needed for dry eyes. 15 mL 0  . lactulose (CEPHULAC) 10 g packet Take 1 packet (10 g total) by mouth as needed (constipation). Dissolve in 8 ounces of water 30 each 3  . Olopatadine HCl 0.2 % SOLN Apply 1 drop to eye daily. (Patient not taking: No  sig reported) 2.5 mL 3  . tamsulosin (FLOMAX) 0.4 MG CAPS capsule Take 1 capsule (0.4 mg total) by mouth daily. Please pack and mail to home 90 capsule 3   No current facility-administered medications for this visit.    PHYSICAL EXAMINATION: ECOG PERFORMANCE STATUS: 1 - Symptomatic but completely ambulatory  Vitals:   02/21/21 0831  BP: (!) 145/76  Pulse: 96  Resp: 18  Temp: 97.6 F (36.4 C)  SpO2: 100%   Filed Weights   02/21/21 0831  Weight: 138 lb 8 oz (62.8 kg)    Due to COVID19 we will limit examination to appearance. Patient had no complaints.  GENERAL:alert, no distress and  comfortable SKIN: skin color normal, no rashes or significant lesions EYES: normal, Conjunctiva are pink and non-injected, sclera clear  NEURO: alert & oriented x 3 with fluent speech   LABORATORY DATA:  I have reviewed the data as listed CBC Latest Ref Rng & Units 01/15/2021 01/14/2021 01/13/2021  WBC 4.0 - 10.5 K/uL 4.5 4.3 4.9  Hemoglobin 13.0 - 17.0 g/dL 12.8(L) 12.8(L) 12.3(L)  Hematocrit 39.0 - 52.0 % 38.5(L) 37.1(L) 36.7(L)  Platelets 150 - 400 K/uL 163 153 154     CMP Latest Ref Rng & Units 01/16/2021 01/15/2021 01/14/2021  Glucose 70 - 99 mg/dL 114(H) 100(H) 100(H)  BUN 8 - 23 mg/dL 6(L) 5(L) 6(L)  Creatinine 0.61 - 1.24 mg/dL 0.94 0.96 0.88  Sodium 135 - 145 mmol/L 137 136 139  Potassium 3.5 - 5.1 mmol/L 3.9 4.0 4.2  Chloride 98 - 111 mmol/L 103 102 103  CO2 22 - 32 mmol/L 26 27 30   Calcium 8.9 - 10.3 mg/dL 8.9 9.0 9.3  Total Protein 6.5 - 8.1 g/dL - 7.5 7.5  Total Bilirubin 0.3 - 1.2 mg/dL - 1.1 0.5  Alkaline Phos 38 - 126 U/L - 108 107  AST 15 - 41 U/L - 48(H) 57(H)  ALT 0 - 44 U/L - 20 21      RADIOGRAPHIC STUDIES: I have personally reviewed the radiological images as listed and agreed with the findings in the report. No results found.   ASSESSMENT & PLAN:  Jim Little is a 75 y.o. male with    1. Hepatocellular Carcinoma, unresectable, stage IIIA  -Based on MRI liver 01/12/21 with Advanced cirrhotic changes involving the liver and Large infiltrating mass versus numerous confluent masses involving the right hepatic lobe. This is consistent with hepatocellular carcinoma. Associated thrombosed middle and portal veins. No definite left hepatic lobe lesions. AFP (01/14/21) 11,904.0.  -His 01/15/21 CT of the chest did not show metastatic disease to the lungs. Staging workup completed.  -He has unclear etiology of cirrhosis, which is followed by Dr. Therisa Doyne. Hep B surface antigen non reactive (05/15/20), ANA negative, Hep C ab non reactive. No alcohol abuse.  -IR Dr. Anselm Pancoast saw  him and recommend Y 90 radioembolization, which was finally approved by his insurance. He is scheduled for 4/14. I printed his schedule, and remind him about appointment.  -Given he will proceed with Y90 procedure for treatment, I will hold systemic treatment (such as Tecentriq and Avastin) and will observe him after Y90.   -F/u in 3 months     2. Social Support, Language barrier -From the [place], was at a refugee camp in [place] -Has several family members living in single house. 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 Neither him nor his family speak [place]  -He speaks  Swahili. He has [facility] Congregational Nurse, [name] [name] who helps him. Her contact is [phone] -He will follow up with SW and Financial office. I encouraged him to bring his medical bills to clinic for more assistance. He will f/u with SW today (02/21/21).   3. Symptom Management: Right abdominal pain, Constipation/decreased appetite/Weight loss, secondary to Liver Cancer  -He was started on Lactulose during initial hospitalization. He has been able to have BM, but not daily.  -To have 1 daily BM, I previously instructed him to take lactulose 1-3 time per day in order to achieve 1-2 bowel movements daily -He will f/u with Dietician today (02/21/21).  -Due to significant constipation, I had him start with Alleve/Ibuprofen as opposed to opioids. He notes having BM, but not daily. I will call in Tramadol today (02/21/21). I encouraged him to continue watch for constipation.  -I encouraged him to bring all his medications with him to clinic.   4. Portal vein thrombosis, Cirrhosis  -Seen with direct admission from 01/13/21-01/16/21  -Patient received heparin drip inpatient. Dr. Marin Olp did not recommend anticoagulation since the thrombus is tumor thrombus. -His liver cirrhosis is followed by Dr. Therisa Doyne from Gastroenterology. Prior work up in 2021 with EGD/Colonoscopy  07/2020   5. Anemia -Mild with Hbg~12 -Iron studies and ferritin 01/15/21 shows iron 91, TIBC 234, Saturation radio 39, and ferritin 150  -Monitor for now.    PLAN: -I called in Tramadol today for his abdominal pain  -Y90 with IR Dr Jeronimo Norma on 4/14. I gave him print out calendar of his schedule.  -Lab and f/u in 3 months  -I talked with [name] who will be him main point of contact. She helps his medical care at home, pt has very low healthy literacy. I told pt to bring in his medicines with him on next visit.  -SW and nutrition consult today    No problem-specific Assessment & Plan notes found for this encounter.   Orders Placed This Encounter  Procedures  . CBC with Differential (Cancer Center Only)    Standing Status:   Standing    Number of Occurrences:   10    Standing Expiration Date:   02/21/2022  . CMP ([facility] only)    Standing Status:   Standing    Number of Occurrences:   10    Standing Expiration Date:   02/21/2022  . AFP tumor marker    Standing Status:   Standing    Number of Occurrences:   10    Standing Expiration Date:   02/21/2022  . Protime-INR    Standing Status:   Standing    Number of Occurrences:   10    Standing Expiration Date:   02/21/2022   All questions were answered. The patient knows to call the clinic with any problems, questions or concerns. No barriers to learning was detected. The total time spent in the appointment was 30 minutes.     Truitt Merle, MD 02/21/2021   I, Joslyn Devon, am acting as scribe for Truitt Merle, MD.   I have reviewed the above documentation for accuracy and completeness, and I agree with the above.

## 2021-02-19 ENCOUNTER — Telehealth: Payer: Self-pay

## 2021-02-19 NOTE — Telephone Encounter (Signed)
I left a voice message for [place] at [phone] requesting an in person interpreter who speaks Swahili Kis[place]ili for his appointments for Wednesday 3/20 at 8:20 her at [facility].  I have left my direct contact information.

## 2021-02-20 ENCOUNTER — Telehealth: Payer: Self-pay

## 2021-02-20 NOTE — Telephone Encounter (Signed)
Called patient to remind him of tomorrow's appointment. Patient states he is taking his medications as directed including norvasc, flomax, lactulose.   Honor Loh RN BSn PCCN  [facility] Cong[facility]ational Nurse [phone]

## 2021-02-21 ENCOUNTER — Telehealth: Payer: Self-pay | Admitting: Nutrition

## 2021-02-21 ENCOUNTER — Encounter: Payer: Self-pay | Admitting: *Deleted

## 2021-02-21 ENCOUNTER — Inpatient Hospital Stay: Payer: Medicaid Other | Admitting: Nutrition

## 2021-02-21 ENCOUNTER — Inpatient Hospital Stay (HOSPITAL_BASED_OUTPATIENT_CLINIC_OR_DEPARTMENT_OTHER): Payer: Medicaid Other | Admitting: Hematology

## 2021-02-21 ENCOUNTER — Inpatient Hospital Stay: Payer: Medicaid Other | Admitting: *Deleted

## 2021-02-21 ENCOUNTER — Other Ambulatory Visit: Payer: Self-pay

## 2021-02-21 ENCOUNTER — Encounter: Payer: Self-pay | Admitting: Hematology

## 2021-02-21 VITALS — BP 145/76 | HR 96 | Temp 97.6°F | Resp 18 | Ht 66.0 in | Wt 138.5 lb

## 2021-02-21 DIAGNOSIS — C220 Liver cell carcinoma: Secondary | ICD-10-CM | POA: Diagnosis not present

## 2021-02-21 MED ORDER — TRAMADOL HCL 50 MG PO TABS: 50.0000 mg | ORAL_TABLET | Freq: Two times a day (BID) | ORAL | 0 refills | Status: DC | PRN

## 2021-02-21 NOTE — Progress Notes (Signed)
75 year old male diagnosed with hepatocellular cancer.  He is followed by Dr. Burr Medico.  Past medical history includes cirrhosis, hypertension, H. pylori, splenomegaly.  Medications include Colace, Folvite, lactulose.  Labs were reviewed.  Height: 5 feet 6 inches. Weight: 138.5 pounds. Usual body weight: Patient does not know. BMI: 22.35. ECOG: 2-3.  Met with patient and interpreter.  Patient reports early satiety and difficulty eating.  Reports he knows he has lost weight but does not know how much.  He has a decreased appetite and has had issues with constipation.  Complains of early satiety.  Reports some problems chewing and require softer foods.  Patient does not eat pork products.  He reports he has difficulty affording food.  He does not have a microwave for many American products.  He needs instruction on eating/preparing foods.  Dietary recall reveals patient does eat fish and beef.  He likes pasta, peanut butter, and bananas.  He does not drink alcohol and is concerned that Ensure will make him drunk.  Nutrition diagnosis: Food and nutrition related knowledge deficit related to new diagnosis of hepatocellular cancer as evidenced by no prior need for nutrition related information.  Intervention: Patient educated to consume smaller more frequent meals and snacks frequently throughout the day. Reviewed high-calorie high-protein foods. Encourage patient to take lactulose per MD instructions to improve constipation.  Educated him that this should help appetite. Provided strategies for consuming softer foods. Provided multiple samples of oral nutrition supplements and encouraged patient to try these.  If he likes them, recommended 2-3 bottles daily.  I can provide additional samples. Provided foods that patient enjoys from the food pantry.  Assured him he could request additional food as needed. Questions were answered.  Teach back method used.  Contact information provided.  Monitoring,  evaluation, goals: Patient will tolerate increased calories and protein to minimize weight loss.  Next visit: To be scheduled with upcoming treatment.  **Disclaimer: This note was dictated with voice recognition software. Similar sounding words can inadvertently be transcribed and this note may contain transcription errors which may not have been corrected upon publication of note.**

## 2021-02-21 NOTE — Telephone Encounter (Signed)
error 

## 2021-02-21 NOTE — Progress Notes (Signed)
[place] Work  Holiday representative met with patient and interpreter in the [facility] patient and family support center.  CSW provided education on CSW role and support services at [facility].  CSW also discussed how pending treatment plan could determine what resources are available.  Patient stated he has been using [facility] transportation to get to his appointments.  CSW reviewed program with patient and instructions on how to call return ride.  Patient has been Economist and requesting RN to call his return ride home.  Patient now aware staff can call transportation for his return ride after his appointments.  Patient identified medication instruction and organization as a concern.  Patient acknowledged that Congregational RN has provided tremendous support with medication organization.  CSW confirmed with interpreting services that AVS and medication instructions can be translated.  This process could take 1-2 weeks.  Once documents are sent, interpreting services will send to outside vendor and respond with approximate completion time.  The documents will then be sent back to provider to distribute to the patient.  Once treatment plan is determined CSW will follow up with patient to determine if additional resources are available.  CSW will continue to follow and support as needed.  Johnnye Lana, MSW, LCSW, OSW-C Clinical Social Worker [facility] [phone]

## 2021-02-22 ENCOUNTER — Telehealth: Payer: Self-pay | Admitting: Hematology

## 2021-02-22 ENCOUNTER — Telehealth: Payer: Self-pay

## 2021-02-22 NOTE — Telephone Encounter (Signed)
Scheduled follow-up appointment per 3/30 los. Patient is aware. 

## 2021-02-22 NOTE — Telephone Encounter (Signed)
Received a call from [facility] IR procedure rescheduled to a sooner date 03/01/21 @0800 . Patient made aware. Transportation assistance will provided to the hospital. Family will make arrangements for pick up.   Honor Loh RN BSN PCCN [facility] Cong[facility]ational Nurse [phone]

## 2021-02-27 NOTE — Congregational Nurse Program (Signed)
  Dept: [facility] Nurse Program Note  Date of Encounter: 02/27/2021  Past Medical History: Past Medical History:  Diagnosis Date  . BPH (benign prostatic hyperplasia)   . Cirrhosis ([place])   . Hypertension   . Splenomegaly     Encounter Details:  Patient is requesting assistance to read and understand instructions regarding tramadol. Patient has received this medication delivered to his home. Instructed to tale I tablet by mouth every 12 hours as needed for pain. Instruction translated into Swahili.He verbalized understanding.  Honor Loh RN BSn PCCN  [facility] [phone]

## 2021-02-28 ENCOUNTER — Telehealth: Payer: Self-pay

## 2021-02-28 ENCOUNTER — Other Ambulatory Visit: Payer: Self-pay | Admitting: Radiology

## 2021-02-28 NOTE — Telephone Encounter (Signed)
-----   Message from [name], MD sent at 02/28/2021  3:08 PM EDT ----- Regarding: Urology Hi Team,  I had a reminder sent that Terelle apparently needs Urology follow up sometime this month with Dr. Pace---it is really hard to see their schedule since not in [place]. Do you think we can get him scheduled to see Dr. Claudia Desanctis in the next 1-2 months (maybe after his [facility] treatment this month--in May or June).  Thanks! CB  ----- Message ----- From: [name], MD Sent: 02/26/2021  12:00 AM EDT To: [name], MD Subject: Urology                                        2 m with Claudia Desanctis

## 2021-02-28 NOTE — Telephone Encounter (Signed)
Urology appointment rescheduled to June 6th at 11am to allow patient time to recover from [employer] treatment. Patient also contacted and informed of appointment scheduled for tomorrow @0800  [facility]. Transportation to hospital provided.Patient son will provided transportation back home.  Honor Loh RN BSn PCCN  [facility] Nurse [phone]

## 2021-03-01 ENCOUNTER — Encounter (HOSPITAL_COMMUNITY)
Admission: RE | Admit: 2021-03-01 | Discharge: 2021-03-01 | Disposition: A | Discharge status: Home / Self Care | Payer: Medicaid Other | Source: Ambulatory Visit | Attending: Diagnostic Radiology | Admitting: Diagnostic Radiology

## 2021-03-01 ENCOUNTER — Other Ambulatory Visit (HOSPITAL_COMMUNITY): Payer: Self-pay | Admitting: Diagnostic Radiology

## 2021-03-01 ENCOUNTER — Telehealth: Payer: Self-pay

## 2021-03-01 ENCOUNTER — Ambulatory Visit (HOSPITAL_COMMUNITY)
Admission: RE | Admit: 2021-03-01 | Discharge: 2021-03-01 | Disposition: A | Discharge status: Home / Self Care | Payer: Medicaid Other | Source: Ambulatory Visit | Attending: Diagnostic Radiology | Admitting: Diagnostic Radiology

## 2021-03-01 ENCOUNTER — Encounter (HOSPITAL_COMMUNITY): Payer: Self-pay

## 2021-03-01 ENCOUNTER — Other Ambulatory Visit: Payer: Self-pay

## 2021-03-01 DIAGNOSIS — C220 Liver cell carcinoma: Secondary | ICD-10-CM

## 2021-03-01 HISTORY — PX: IR US GUIDE VASC ACCESS RIGHT: IMG2390

## 2021-03-01 HISTORY — PX: IR ANGIOGRAM VISCERAL SELECTIVE: IMG657

## 2021-03-01 HISTORY — PX: IR ANGIOGRAM SELECTIVE EACH ADDITIONAL VESSEL: IMG667

## 2021-03-01 LAB — COMPREHENSIVE METABOLIC PANEL
ALT: 30 U/L (ref 0–44)
AST: 62 U/L — ABNORMAL HIGH (ref 15–41)
Albumin: 3.6 g/dL (ref 3.5–5.0)
Alkaline Phosphatase: 138 U/L — ABNORMAL HIGH (ref 38–126)
Anion gap: 7 (ref 5–15)
BUN: 10 mg/dL (ref 8–23)
CO2: 25 mmol/L (ref 22–32)
Calcium: 8.9 mg/dL (ref 8.9–10.3)
Chloride: 104 mmol/L (ref 98–111)
Creatinine, Ser: 0.85 mg/dL (ref 0.61–1.24)
GFR, Estimated: >60 mL/min (ref >60)
Glucose, Bld: 109 mg/dL — ABNORMAL HIGH (ref 70–99)
Potassium: 4.2 mmol/L (ref 3.5–5.1)
Sodium: 136 mmol/L (ref 135–145)
Total Bilirubin: 0.8 mg/dL (ref 0.3–1.2)
Total Protein: 8.8 g/dL — ABNORMAL HIGH (ref 6.5–8.1)

## 2021-03-01 LAB — CBC
HCT: 37.8 % — ABNORMAL LOW (ref 39.0–52.0)
Hemoglobin: 12.4 g/dL — ABNORMAL LOW (ref 13.0–17.0)
MCH: 33.6 pg (ref 26.0–34.0)
MCHC: 32.8 g/dL (ref 30.0–36.0)
MCV: 102.4 fL — ABNORMAL HIGH (ref 80.0–100.0)
Platelets: 176 10*3/uL (ref 150–400)
RBC: 3.69 MIL/uL — ABNORMAL LOW (ref 4.22–5.81)
RDW: 14.6 % (ref 11.5–15.5)
WBC: 3.6 10*3/uL — ABNORMAL LOW (ref 4.0–10.5)
nRBC: 0 % (ref 0.0–0.2)

## 2021-03-01 LAB — APTT: aPTT: 40 seconds — ABNORMAL HIGH (ref 24–36)

## 2021-03-01 LAB — GLUCOSE, CAPILLARY: Glucose-Capillary: 70 mg/dL (ref 70–99)

## 2021-03-01 LAB — PROTIME-INR
INR: 1.2 (ref 0.8–1.2)
Prothrombin Time: 15.1 seconds (ref 11.4–15.2)

## 2021-03-01 MED ORDER — MIDAZOLAM HCL 2 MG/2ML IJ SOLN
INTRAMUSCULAR | Status: DC | PRN
  Administered 2021-03-01 (×2): 1 mg via INTRAVENOUS

## 2021-03-01 MED ORDER — TECHNETIUM TO 99M ALBUMIN AGGREGATED
4.4000 | Freq: Once | INTRAVENOUS | Status: AC
  Administered 2021-03-01: 4.4 via INTRAVENOUS

## 2021-03-01 MED ORDER — LIDOCAINE HCL (PF) 1 % IJ SOLN
INTRAMUSCULAR | Status: DC | PRN
Start: 2021-03-01 — End: 2021-03-02
  Administered 2021-03-01: 5 mL

## 2021-03-01 MED ORDER — HYDROCODONE-ACETAMINOPHEN 5-325 MG PO TABS: 1.0000 | ORAL_TABLET | ORAL | Status: DC | PRN

## 2021-03-01 MED ORDER — IOHEXOL 300 MG/ML  SOLN
100.0000 mL | Freq: Once | INTRAMUSCULAR | Status: AC | PRN
  Administered 2021-03-01: 65 mL via INTRA_ARTERIAL

## 2021-03-01 MED ORDER — FENTANYL CITRATE (PF) 100 MCG/2ML IJ SOLN
INTRAMUSCULAR | Status: AC
  Filled 2021-03-01: qty 2

## 2021-03-01 MED ORDER — MIDAZOLAM HCL 2 MG/2ML IJ SOLN
INTRAMUSCULAR | Status: AC
  Filled 2021-03-01: qty 4

## 2021-03-01 MED ORDER — IOHEXOL 300 MG/ML  SOLN
100.0000 mL | Freq: Once | INTRAMUSCULAR | Status: AC | PRN
  Administered 2021-03-01: 10 mL via INTRA_ARTERIAL

## 2021-03-01 MED ORDER — LIDOCAINE HCL 1 % IJ SOLN
INTRAMUSCULAR | Status: AC
  Filled 2021-03-01: qty 20

## 2021-03-01 MED ORDER — SODIUM CHLORIDE 0.9 % IV SOLN: INTRAVENOUS | Status: DC

## 2021-03-01 MED ORDER — IOHEXOL 300 MG/ML  SOLN
100.0000 mL | Freq: Once | INTRAMUSCULAR | Status: AC | PRN
  Administered 2021-03-01: 74 mL via INTRA_ARTERIAL

## 2021-03-01 MED ORDER — FENTANYL CITRATE (PF) 100 MCG/2ML IJ SOLN
INTRAMUSCULAR | Status: DC | PRN
  Administered 2021-03-01 (×2): 50 ug via INTRAVENOUS

## 2021-03-01 NOTE — Telephone Encounter (Signed)
Contacted patient son Heide Scales and informed that patient will be ready for pick up at 4:30PM.   Honor Loh RN BSn [place] Nurse [phone]

## 2021-03-01 NOTE — H&P (Signed)
Chief Complaint: Patient was seen in consultation today for HCC/pre Y-90 road mapping.  Referring Physician(s): [name] (oncology)  Supervising Physician: Markus Daft  Patient Status: [facility] - Out-pt  History of Present Illness: Jim Little is a 75 y.o. male with a past medical history of hypertension, cirrhosis, HCC, and BPH. He was unfortunately diagnosed with [place] in 12/2020 (diagnosed on IP basis). His cancer is managed by Cassandra Heilingoetter, PA-C. He was referred to IR for liver directed therapy of [place] and met with Dr. Anselm Pancoast in consultation last month to discuss treatment options. At that time, through shared decision making, patient decided to pursue Y-90 radioembolization as management.  Patient presents today for image-guided pre Y-90 road mapping to prepare for treatment. Patient awake and alert sitting in bed. Roda Shutters medical interpreter at bedside. Complains of RUQ pain, rated as "moderate" at this time. Denies N/V associated with abdominal pain. Denies fever, chills, chest pain, dyspnea, or headache.   Past Medical History:  Diagnosis Date  . BPH (benign prostatic hyperplasia)   . Cirrhosis ([place])   . Hypertension   . Splenomegaly     Past Surgical History:  Procedure Laterality Date  . BIOPSY  08/22/2020   Procedure: BIOPSY;  Surgeon: Ronnette Juniper, MD;  Location: [facility];  Service: Gastroenterology;;  . COLONOSCOPY WITH PROPOFOL N/A 08/22/2020   Procedure: COLONOSCOPY WITH PROPOFOL;  Surgeon: Ronnette Juniper, MD;  Location: [facility];  Service: Gastroenterology;  Laterality: N/A;  . ESOPHAGOGASTRODUODENOSCOPY (EGD) WITH PROPOFOL N/A 08/22/2020   Procedure: ESOPHAGOGASTRODUODENOSCOPY (EGD) WITH PROPOFOL;  Surgeon: Ronnette Juniper, MD;  Location: [facility] END[facility];  Service: Gastroenterology;  Laterality: N/A;  . IR RADIOLOGIST EVAL & MGMT  02/06/2021  . UPPER GASTROINTESTINAL ENDOSCOPY  03/2018   [place] --candidiasis plus esophageal varices  grade 2.  Treated with fluconazole and status post esophageal banding.  Marland Kitchen US ECHOCARDIOGRAPHY  10/2016   per overseas records from [place] - LVEF 68%; mildly calcified aortic valve, normal function otherwise    Allergies: Pork-derived products  Medications: Prior to Admission medications   Medication Sig Start Date End Date Taking? Authorizing Provider  acetaminophen (TYLENOL) 500 MG tablet Take 500 mg by mouth 2 (two) times daily as needed for mild pain.    [provider]  amLODipine (NORVASC) 5 MG tablet Take 1 tablet (5 mg total) by mouth daily. Patient not taking: Reported on 02/09/2021 01/31/21   [name], MD  azelastine (OPTIVAR) 0.05 % ophthalmic solution Place 1 drop into both eyes 2 (two) times daily. Patient not taking: No sig reported 01/31/20   [name], MD  benzocaine (ORAJEL) 10 % mucosal gel Use as directed in the mouth or throat 3 (three) times daily as needed for mouth pain. 01/16/21   Matilde Haymaker, MD  benzocaine (ORAJEL) 10 % mucosal gel USE AS DIRECTED IN THE MOUTH OR THROAT 3 (THREE) TIMES DAILY AS NEEDED FOR MOUTH PAIN. 01/16/21 01/16/22  Matilde Haymaker, MD  docusate sodium (COLACE) 100 MG capsule Take 1 capsule (100 mg total) by mouth daily as needed. 01/16/21 01/16/22  Matilde Haymaker, MD  docusate sodium (COLACE) 100 MG capsule TAKE 1 CAPSULE (100 MG TOTAL) BY MOUTH DAILY AS NEEDED. 01/16/21 01/16/22  Matilde Haymaker, MD  fluticasone [facility]) 50 MCG/ACT nasal spray Place 2 sprays into both nostrils daily. Patient not taking: No sig reported 03/06/20   [name], MD  folic acid (FOLVITE) 1 MG tablet Take 1 tablet (1 mg total) by mouth daily.  Patient not taking: No sig reported 05/15/20   Milus Banister C, DO  hydroxypropyl methylcellulose / hypromellose (ISOPTO TEARS / GONIOVISC) 2.5 % ophthalmic solution Place 1 drop into both eyes 4 (four) times daily as needed for dry eyes. 01/16/21   Matilde Haymaker, MD  hydroxypropyl methylcellulose / hypromellose (ISOPTO  TEARS / GONIOVISC) 2.5 % ophthalmic solution PLACE 1 DROP INTO BOTH EYES 4 (FOUR) TIMES DAILY AS NEEDED FOR DRY EYES. 01/16/21 01/16/22  Matilde Haymaker, MD  lactulose (CEPHULAC) 10 g packet Take 1 packet (10 g total) by mouth as needed (constipation). Dissolve in 8 ounces of water 11/27/20   [name], MD  Olopatadine HCl 0.2 % SOLN Apply 1 drop to eye daily. Patient not taking: No sig reported 03/06/20   [name], MD  tamsulosin (FLOMAX) 0.4 MG CAPS capsule Take 1 capsule (0.4 mg total) by mouth daily. Please pack and mail to home 01/31/21   [name], MD  traMADol (ULTRAM) 50 MG tablet Take 1 tablet (50 mg total) by mouth every 12 (twelve) hours as needed for severe pain. 02/21/21   Truitt Merle, MD     No family history on file.  Social History   Socioeconomic History  . Marital status: Married    Spouse name: Not on file  . Number of children: 2  . Years of education: Not on file  . Highest education level: Not on file  Occupational History  . Not on file  Tobacco Use  . Smoking status: Never Smoker  . Smokeless tobacco: Never Used  Substance and Sexual Activity  . Alcohol use: Never  . Drug use: Not on file  . Sexual activity: Not on file  Other Topics Concern  . Not on file  Social History Narrative  . Not on file   Social Determinants of Health   Financial Resource Strain: Not on file  Food Insecurity: Not on file  Transportation Needs: Not on file  Physical Activity: Not on file  Stress: Not on file  Social Connections: Not on file     Review of Systems: A 12 point ROS discussed and pertinent positives are indicated in the HPI above.  All other systems are negative.  Review of Systems  Constitutional: Negative for chills and fever.  Respiratory: Negative for shortness of breath and wheezing.   Cardiovascular: Negative for chest pain and palpitations.  Gastrointestinal: Positive for abdominal pain. Negative for nausea and vomiting.  Neurological:  Negative for headaches.  Psychiatric/Behavioral: Negative for behavioral problems and confusion.    Vital Signs: BP (!) 152/81   Pulse 76   Temp 98.6 F (37 C) (Oral)   Resp 18   Ht 5' 6"  (1.676 m)   Wt 138 lb 8 oz (62.8 kg)   SpO2 99%   BMI 22.35 kg/m   Physical Exam Vitals and nursing note reviewed.  Constitutional:      General: He is not in acute distress. Cardiovascular:     Rate and Rhythm: Normal rate and regular rhythm.     Heart sounds: Normal heart sounds. No murmur heard.   Pulmonary:     Effort: Pulmonary effort is normal. No respiratory distress.     Breath sounds: Normal breath sounds. No wheezing.  Abdominal:     Palpations: Abdomen is soft.     Comments: RUQ tenderness.  Skin:    General: Skin is warm and dry.  Neurological:     Mental Status: He is alert and oriented to  person, place, and time.      MD Evaluation Airway: WNL Heart: WNL Abdomen: WNL Chest/ Lungs: WNL ASA  Classification: 3 Mallampati/Airway Score: One   Imaging: IR Radiologist Eval & Mgmt  Result Date: 02/06/2021 Please refer to notes tab for details about interventional procedure. (Op Note)   Labs:  CBC: Recent Labs    01/13/21 2146 01/14/21 0753 01/15/21 0320 03/01/21 0906  WBC 4.9 4.3 4.5 3.6*  HGB 12.3* 12.8* 12.8* 12.4*  HCT 36.7* 37.1* 38.5* 37.8*  PLT 154 153 163 176    COAGS: Recent Labs    05/10/20 2148 05/11/20 0437 07/14/20 1030 01/13/21 2146 01/15/21 0320 03/01/21 0906  INR  --    < > 1.2 1.3* 1.3* 1.2  APTT 39*  --   --   --   --  40*   < > = values in this interval not displayed.    BMP: Recent Labs    05/13/20 0345 05/14/20 0455 06/01/20 1222 12/04/20 1137 01/13/21 2146 01/14/21 0753 01/15/21 0320 01/16/21 0257 03/01/21 0906  NA 136 138 136 142   < > 139 136 137 136  K 3.6 3.7 4.6 3.7   < > 4.2 4.0 3.9 4.2  CL 108 108 104 105   < > 103 102 103 104  CO2 22 25 26 24    < > 30 27 26 25   GLUCOSE 95 101* 97 80   < > 100* 100*  114* 109*  BUN 15 10 6* 6*   < > 6* 5* 6* 10  CALCIUM 8.2* 8.1* 8.6* 8.9   < > 9.3 9.0 8.9 8.9  CREATININE 0.83 0.47* 1.16 1.00   < > 0.88 0.96 0.94 0.85  GFRNONAA >60 >60 >60 72   < > >60 >60 >60 >60  GFRAA >60 >60 >60 84  --   --   --   --   --    < > = values in this interval not displayed.    LIVER FUNCTION TESTS: Recent Labs    01/13/21 2146 01/14/21 0753 01/15/21 0320 03/01/21 0906  BILITOT 0.7 0.5 1.1 0.8  AST 62* 57* 48* 62*  ALT 20 21 20 30   ALKPHOS 110 107 108 138*  PROT 7.4 7.5 7.5 8.8*  ALBUMIN 3.2* 3.2* 3.1* 3.6     Assessment and Plan:  HCC. Plan for image-guided mesenteric arteriogram with pre Y-90/injection of tc99 MAA for mapping with possible embolization of mesenteric branches today with Dr. Anselm Pancoast. Patient is NPO. Afebrile. He does not take blood thinners. INR 1.2 today.  Rosanna Randy, Swahili medical interpreter, was used throughout today's interaction.  Risks and benefits discussed with the patient including, but not limited to bleeding, infection, vascular injury, post procedural pain, nausea, vomiting and fatigue, contrast induced renal failure, liver failure, radiation injury to the bowel, radiation induced cholecystitis, neutropenia and possible need for additional procedures. All of the patient's questions were answered, patient is agreeable to proceed. Consent signed and in chart.   Thank you for this interesting consult.  I greatly enjoyed meeting Pancho Rushing and look forward to participating in their care.  A copy of this report was sent to the requesting provider on this date.  Electronically Signed: Earley Abide, PA-C 03/01/2021, 9:32 AM   I spent a total of 40 Minutes in face to face in clinical consultation, greater than 50% of which was counseling/coordinating care for HCC/pre Y-90 road mapping.

## 2021-03-01 NOTE — Procedures (Signed)
Interventional Radiology Procedure:   Indications: Hepatocellular carcinoma  Procedure: Visceral angiography (celiac, SMA and right hepatic) and injection of Tc 71m MAA via right hepatic artery  Findings: Replaced right hepatic artery supplying the right hepatic lobe.  MAA injected in right hepatic artery.  Complications: None     EBL: less than 25 ml  Plan: Nuclear medicine MAA scan and plan for discharge after 4 hours.    Jim Little R. Anselm Pancoast, MD  Pager: [phone]

## 2021-03-01 NOTE — Discharge Instructions (Signed)
Urgent needs - Interventional Radiology on call MD [phone]  Wound - May remove dressing and shower in 24. Keep site clean and dry.  Replace with bandaid as needed.  Do not submerge in tub or water until site healing well.   Do not lift greater than 10lbs x 1 week.     Femoral Site Care  This sheet gives you information about how to care for yourself after your procedure. Your health care provider may also give you more specific instructions. If you have problems or questions, contact your health care provider. What can I expect after the procedure? After the procedure, it is common to have:  Bruising that usually fades within 1-2 weeks.  Tenderness at the site. Follow these instructions at home: Wound care  Follow instructions from your health care provider about how to take care of your insertion site. Make sure you: ? Wash your hands with soap and water before you change your bandage (dressing). If soap and water are not available, use hand sanitizer. ? Change your dressing as told by your health care provider. ? Leave stitches (sutures), skin glue, or adhesive strips in place. These skin closures may need to stay in place for 2 weeks or longer. If adhesive strip edges start to loosen and curl up, you may trim the loose edges. Do not remove adhesive strips completely unless your health care provider tells you to do that.  Do not take baths, swim, or use a hot tub until your health care provider approves.  You may shower 24-48 hours after the procedure or as told by your health care provider. ? Gently wash the site with plain soap and water. ? Pat the area dry with a clean towel. ? Do not rub the site. This may cause bleeding.  Do not apply powder or lotion to the site. Keep the site clean and dry.  Check your femoral site every day for signs of infection. Check for: ? Redness, swelling, or pain. ? Fluid or blood. ? Warmth. ? Pus or a bad smell. Activity  For the first  2-3 days after your procedure, or as long as directed: ? Avoid climbing stairs as much as possible. ? Do not squat.  Do not lift anything that is heavier than 10 lb (4.5 kg), or the limit that you are told, until your health care provider says that it is safe.  Rest as directed. ? Avoid sitting for a long time without moving. Get up to take short walks every 1-2 hours.  Do not drive for 24 hours if you were given a medicine to help you relax (sedative). General instructions  Take over-the-counter and prescription medicines only as told by your health care provider.  Keep all follow-up visits as told by your health care provider. This is important. Contact a health care provider if you have:  A fever or chills.  You have redness, swelling, or pain around your insertion site. Get help right away if:  The catheter insertion area swells very fast.  You pass out.  You suddenly start to sweat or your skin gets clammy.  The catheter insertion area is bleeding, and the bleeding does not stop when you hold steady pressure on the area.  The area near or just beyond the catheter insertion site becomes pale, cool, tingly, or numb. These symptoms may represent a serious problem that is an emergency. Do not wait to see if the symptoms will go away. Get medical help right away. Call  your local emergency services (911 in the [place] Do not drive yourself to the hospital. Summary  After the procedure, it is common to have bruising that usually fades within 1-2 weeks.  Check your femoral site every day for signs of infection.  Do not lift anything that is heavier than 10 lb (4.5 kg), or the limit that you are told, until your health care provider says that it is safe. This information is not intended to replace advice given to you by your health care provider. Make sure you discuss any questions you have with your health care provider. Document Revised: 07/14/2020 Document Reviewed:  07/14/2020 Elsevier Patient Education  2021 Elsevier    Hepatic Artery Radioembolization, Care After This sheet gives you information about how to care for yourself after your procedure. Your health care provider may also give you more specific instructions. If you have problems or questions, contact your health care provider. What can I expect after the procedure? After the procedure, it is common to have:  A slight fever for 1-2 weeks. If your fever gets worse, tell your health care provider.  Tiredness (fatigue).  Loss of appetite. This should gradually improve after about 1 week.  Abdominal pain on your right side.  Soreness and tenderness in your groin area where the needle and catheter were placed (puncture site). Follow these instructions at home: Puncture site care  Follow instructions from your health care provider about how to take care of the puncture site. Make sure you: ? Wash your hands with soap and water for at least 20 seconds before and after you change your bandage (dressing). If soap and water are not available, use hand sanitizer. ? Change your dressing as told by your health care provider. ? Leave stitches (sutures), skin glue, or adhesive strips in place. These skin closures may need to stay in place for 2 weeks or longer. If adhesive strip edges start to loosen and curl up, you may trim the loose edges. Do not remove adhesive strips completely unless your health care provider tells you to do that.  Check your puncture site every day for signs of infection. Check for: ? More redness, swelling, or pain. ? Fluid or blood. ? Warmth. ? Pus or a bad smell.   Activity  Rest as told by your health care provider.  Avoid sitting for a long time without moving. Get up to take short walks every 1-2 hours. This is important to improve blood flow and breathing. Ask for help if you feel weak or unsteady.  Return to your normal activities as told by your health care  provider. Ask your health care provider what activities are safe for you.  If you were given a sedative during the procedure, it can affect you for several hours. Do not drive or operate machinery until your health care provider says that it is safe.  Do not lift anything that is heavier than 10 lb (4.5 kg), or the limit that you are told, until your health care provider says that it is safe. Medicines  Take over-the-counter and prescription medicines only as told by your health care provider.  Ask your health care provider if the medicine prescribed to you: ? Requires you to avoid driving or using machinery. ? Can cause constipation. You may need to take these actions to prevent or treat constipation:  Drink enough fluid to keep your urine pale yellow.  Take over-the-counter or prescription medicines.  Eat foods that are high in fiber,  such as beans, whole grains, and fresh fruits and vegetables.  Limit foods that are high in fat and processed sugars, such as fried or sweet foods. Radiation precautions For up to a week after your procedure, there will be a small amount of radioactivity near your liver. This is not especially dangerous to other people. However, as told by your health care provider, you should follow these precautions for 7 days:  Do not come in close contact with people.  Do not sleep in the same bed as someone else.  Do not hold children or babies.  Do not have contact with pregnant women. General instructions  Eat frequent, small meals until your appetite returns. Follow instructions from your health care provider about eating or drinking restrictions.  Do not take baths, swim, or use a hot tub until your health care provider approves. You may take showers. Wash your puncture site with mild soap and water, and pat the area dry.  Wear compression stockings as told by your health care provider. These stockings help to prevent blood clots and reduce swelling in  your legs.  Keep all follow-up visits as told by your health care provider. This is important. You may need to have blood tests and imaging tests done.   Contact a health care provider if:  You have any of these signs of infection: ? More redness, swelling, or pain around your puncture site. ? Fluid or blood coming from your puncture site. ? Warmth coming from your puncture site. ? Pus or a bad smell coming from your puncture site.  You have pain that: ? Gets worse. ? Does not get better with medicine. ? Feels like very bad heartburn. ? Is in the middle of your abdomen, above your belly button.  You have any signs of infection or liver failure, such as: ? Your skin or the white parts of your eyes turn yellow (jaundice). ? The color of your urine changes to dark brown. ? The color of your stool (feces) changes to light yellow. ? Your abdominal measurement (girth) increases in a short period of time. ? You gain more than 5 lb (2.3 kg) in a short period of time. Get help right away if you:  Have a fever that lasts longer than 2 weeks or is higher than what your health care provider told you to expect.  Develop any of the following in your legs: ? Pain. ? Swelling. ? Skin that is cold or pale or turns blue.  Have chest pain.  Have blood in your vomit, saliva, or stool.  Have trouble breathing. These symptoms may represent a serious problem that is an emergency. Do not wait to see if the symptoms will go away. Get medical help right away. Call your local emergency services (911 in the [place] Do not drive yourself to the hospital. Summary  After the procedure, it is common to have a slight fever for 1-2 weeks, tiredness, loss of appetite, abdominal pain on the right side, and groin tenderness where the catheter was placed.  For up to a week after your procedure, as told by your health care provider, do not come in close contact with people.  Follow instructions from your health  care provider about how to take care of the puncture site.  Contact a health care provider if you have any signs of infection.  Get help right away if you develop pain or swelling in your legs or if your legs feel cool or look pale.  This information is not intended to replace advice given to you by your health care provider. Make sure you discuss any questions you have with your health care provider. Document Revised: 08/30/2019 Document Reviewed: 08/30/2019 Elsevier Patient Education  2021 [employer].  Moderate Conscious Sedation, Adult, Care After This sheet gives you information about how to care for yourself after your procedure. Your health care provider may also give you more specific instructions. If you have problems or questions, contact your health care provider. What can I expect after the procedure? After the procedure, it is common to have:  Sleepiness for several hours.  Impaired judgment for several hours.  Difficulty with balance.  Vomiting if you eat too soon. Follow these instructions at home: For the time period you were told by your health care provider:  Rest.  Do not participate in activities where you could fall or become injured.  Do not drive or use machinery.  Do not drink alcohol.  Do not take sleeping pills or medicines that cause drowsiness.  Do not make important decisions or sign legal documents.  Do not take care of children on your own.      Eating and drinking  Follow the diet recommended by your health care provider.  Drink enough fluid to keep your urine pale yellow.  If you vomit: ? Drink water, juice, or soup when you can drink without vomiting. ? Make sure you have little or no nausea before eating solid foods.   General instructions  Take over-the-counter and prescription medicines only as told by your health care provider.  Have a responsible adult stay with you for the time you are told. It is important to have someone  help care for you until you are awake and alert.  Do not smoke.  Keep all follow-up visits as told by your health care provider. This is important. Contact a health care provider if:  You are still sleepy or having trouble with balance after 24 hours.  You feel light-headed.  You keep feeling nauseous or you keep vomiting.  You develop a rash.  You have a fever.  You have redness or swelling around the IV site. Get help right away if:  You have trouble breathing.  You have new-onset confusion at home. Summary  After the procedure, it is common to feel sleepy, have impaired judgment, or feel nauseous if you eat too soon.  Rest after you get home. Know the things you should not do after the procedure.  Follow the diet recommended by your health care provider and drink enough fluid to keep your urine pale yellow.  Get help right away if you have trouble breathing or new-onset confusion at home. This information is not intended to replace advice given to you by your health care provider. Make sure you discuss any questions you have with your health care provider. Document Revised: 03/10/2020 Document Reviewed: 10/07/2019 Elsevier Patient Education  2021 [employer].  The following are post procedure instructions for NEXT VISIT: Post Y-90 Radioembolization Discharge Instructions  You have been given a radioactive material during your procedure.  While it is safe for you to be discharged home from the hospital, you need to proceed directly home.    Do not use public transportation, including air travel, lasting more than 2 hours for 1 week.  Avoid crowded public places for 1 week.  Adult visitors should try to avoid close contact with you for 1 week.    Children and pregnant females  should not visit or have close contact with you for 1 week.  Items that you touch are not radioactive.  Do not sleep in the same bed as your partner for 1 week, and a condom should be used  for sexual activity during the first 24 hours.  Your blood may be radioactive and caution should be used if any bleeding occurs during the recovery period.  Body fluids may be radioactive for 24 hours.  Wash your hands after voiding.  Men should sit to urinate.  Dispose of any soiled materials (flush down toilet or place in trash at home) during the first day.  Drink 6 to 8 glasses of fluids per day for 5 days to hydrate yourself.  If you need to see a doctor during the first week, you must let them know that you were treated with yttrium-90 microspheres, and will be slightly radioactive.  They can call Interventional Radiology [phone] with any questions.

## 2021-03-02 ENCOUNTER — Other Ambulatory Visit: Payer: Self-pay | Admitting: Diagnostic Radiology

## 2021-03-02 DIAGNOSIS — C220 Liver cell carcinoma: Secondary | ICD-10-CM

## 2021-03-03 ENCOUNTER — Telehealth: Payer: Self-pay

## 2021-03-03 NOTE — Telephone Encounter (Signed)
Contacted patient for follow up s/p hospital IR procedure. Patient reports to be doing well without any signs of bleeding,drainage or infection at the puncture site. Advised to continue taking medications as prescribed including tramadol as needed for pain.  Honor Loh RN [facility]  [facility] [phone]

## 2021-03-05 ENCOUNTER — Telehealth: Payer: Self-pay

## 2021-03-05 NOTE — Telephone Encounter (Signed)
Transportation for CT scan at [facility] confirmed. Patient informed and verbalized understanding.  Honor Loh RN [facility]  [facility] [phone]

## 2021-03-06 ENCOUNTER — Telehealth: Payer: Self-pay

## 2021-03-06 ENCOUNTER — Ambulatory Visit (HOSPITAL_COMMUNITY)
Admission: RE | Admit: 2021-03-06 | Discharge: 2021-03-06 | Disposition: A | Discharge status: Home / Self Care | Payer: Medicaid Other | Source: Ambulatory Visit | Attending: Diagnostic Radiology | Admitting: Diagnostic Radiology

## 2021-03-06 ENCOUNTER — Other Ambulatory Visit: Payer: Self-pay

## 2021-03-06 ENCOUNTER — Encounter (HOSPITAL_COMMUNITY): Payer: Self-pay

## 2021-03-06 DIAGNOSIS — I81 Portal vein thrombosis: Secondary | ICD-10-CM | POA: Insufficient documentation

## 2021-03-06 DIAGNOSIS — I820 Budd-Chiari syndrome: Secondary | ICD-10-CM | POA: Insufficient documentation

## 2021-03-06 DIAGNOSIS — K7460 Unspecified cirrhosis of liver: Secondary | ICD-10-CM | POA: Diagnosis not present

## 2021-03-06 DIAGNOSIS — C220 Liver cell carcinoma: Secondary | ICD-10-CM | POA: Diagnosis present

## 2021-03-06 MED ORDER — IOHEXOL 350 MG/ML SOLN
100.0000 mL | Freq: Once | INTRAVENOUS | Status: AC | PRN
  Administered 2021-03-06: 100 mL via INTRAVENOUS

## 2021-03-06 NOTE — Telephone Encounter (Signed)
error 

## 2021-03-06 NOTE — Telephone Encounter (Signed)
Patient called to remind him of appointment for CT today. Arranged transportation for patient to appointment.  Honor Loh RN [facility]  [facility] [phone]

## 2021-03-07 ENCOUNTER — Other Ambulatory Visit: Payer: Self-pay | Admitting: Radiology

## 2021-03-08 ENCOUNTER — Ambulatory Visit (HOSPITAL_COMMUNITY)
Admission: RE | Admit: 2021-03-08 | Discharge: 2021-03-08 | Disposition: A | Discharge status: Home / Self Care | Payer: Medicaid Other | Source: Ambulatory Visit | Attending: Diagnostic Radiology | Admitting: Diagnostic Radiology

## 2021-03-08 ENCOUNTER — Encounter (HOSPITAL_COMMUNITY): Payer: Self-pay

## 2021-03-08 ENCOUNTER — Other Ambulatory Visit (HOSPITAL_COMMUNITY): Payer: Self-pay | Admitting: Diagnostic Radiology

## 2021-03-08 ENCOUNTER — Encounter (HOSPITAL_COMMUNITY)
Admission: RE | Admit: 2021-03-08 | Discharge: 2021-03-08 | Disposition: A | Discharge status: Home / Self Care | Payer: Medicaid Other | Source: Ambulatory Visit | Attending: Diagnostic Radiology | Admitting: Diagnostic Radiology

## 2021-03-08 ENCOUNTER — Other Ambulatory Visit (HOSPITAL_COMMUNITY): Payer: Medicaid Other

## 2021-03-08 ENCOUNTER — Telehealth: Payer: Self-pay

## 2021-03-08 ENCOUNTER — Other Ambulatory Visit: Payer: Self-pay

## 2021-03-08 ENCOUNTER — Ambulatory Visit (HOSPITAL_COMMUNITY): Payer: Medicaid Other

## 2021-03-08 DIAGNOSIS — Z79899 Other long term (current) drug therapy: Secondary | ICD-10-CM | POA: Insufficient documentation

## 2021-03-08 DIAGNOSIS — C220 Liver cell carcinoma: Secondary | ICD-10-CM

## 2021-03-08 DIAGNOSIS — I10 Essential (primary) hypertension: Secondary | ICD-10-CM | POA: Diagnosis not present

## 2021-03-08 DIAGNOSIS — K7460 Unspecified cirrhosis of liver: Secondary | ICD-10-CM | POA: Diagnosis not present

## 2021-03-08 DIAGNOSIS — Z7901 Long term (current) use of anticoagulants: Secondary | ICD-10-CM | POA: Insufficient documentation

## 2021-03-08 HISTORY — PX: IR EMBO TUMOR ORGAN ISCHEMIA INFARCT INC GUIDE ROADMAPPING: IMG5449

## 2021-03-08 HISTORY — PX: IR US GUIDE VASC ACCESS RIGHT: IMG2390

## 2021-03-08 HISTORY — PX: IR ANGIOGRAM SELECTIVE EACH ADDITIONAL VESSEL: IMG667

## 2021-03-08 HISTORY — PX: IR ANGIOGRAM VISCERAL SELECTIVE: IMG657

## 2021-03-08 LAB — COMPREHENSIVE METABOLIC PANEL
ALT: 24 U/L (ref 0–44)
AST: 60 U/L — ABNORMAL HIGH (ref 15–41)
Albumin: 3.5 g/dL (ref 3.5–5.0)
Alkaline Phosphatase: 137 U/L — ABNORMAL HIGH (ref 38–126)
Anion gap: 6 (ref 5–15)
BUN: 9 mg/dL (ref 8–23)
CO2: 27 mmol/L (ref 22–32)
Calcium: 8.5 mg/dL — ABNORMAL LOW (ref 8.9–10.3)
Chloride: 101 mmol/L (ref 98–111)
Creatinine, Ser: 0.96 mg/dL (ref 0.61–1.24)
GFR, Estimated: >60 mL/min (ref >60)
Glucose, Bld: 125 mg/dL — ABNORMAL HIGH (ref 70–99)
Potassium: 4.4 mmol/L (ref 3.5–5.1)
Sodium: 134 mmol/L — ABNORMAL LOW (ref 135–145)
Total Bilirubin: 0.7 mg/dL (ref 0.3–1.2)
Total Protein: 8.5 g/dL — ABNORMAL HIGH (ref 6.5–8.1)

## 2021-03-08 LAB — CBC WITH DIFFERENTIAL/PLATELET
Abs Immature Granulocytes: 0 10*3/uL (ref 0.00–0.07)
Basophils Absolute: 0 10*3/uL (ref 0.0–0.1)
Basophils Relative: 1 %
Eosinophils Absolute: 0.2 10*3/uL (ref 0.0–0.5)
Eosinophils Relative: 5 %
HCT: 38.2 % — ABNORMAL LOW (ref 39.0–52.0)
Hemoglobin: 12.5 g/dL — ABNORMAL LOW (ref 13.0–17.0)
Immature Granulocytes: 0 %
Lymphocytes Relative: 22 %
Lymphs Abs: 0.8 10*3/uL (ref 0.7–4.0)
MCH: 33.3 pg (ref 26.0–34.0)
MCHC: 32.7 g/dL (ref 30.0–36.0)
MCV: 101.9 fL — ABNORMAL HIGH (ref 80.0–100.0)
Monocytes Absolute: 0.4 10*3/uL (ref 0.1–1.0)
Monocytes Relative: 11 %
Neutro Abs: 2.1 10*3/uL (ref 1.7–7.7)
Neutrophils Relative %: 61 %
Platelets: 153 10*3/uL (ref 150–400)
RBC: 3.75 MIL/uL — ABNORMAL LOW (ref 4.22–5.81)
RDW: 14.4 % (ref 11.5–15.5)
WBC: 3.5 10*3/uL — ABNORMAL LOW (ref 4.0–10.5)
nRBC: 0 % (ref 0.0–0.2)

## 2021-03-08 LAB — PROTIME-INR
INR: 1.3 — ABNORMAL HIGH (ref 0.8–1.2)
Prothrombin Time: 15.7 seconds — ABNORMAL HIGH (ref 11.4–15.2)

## 2021-03-08 MED ORDER — FENTANYL CITRATE (PF) 100 MCG/2ML IJ SOLN
INTRAMUSCULAR | Status: AC | PRN
  Administered 2021-03-08: 50 ug via INTRAVENOUS
  Administered 2021-03-08: 25 ug via INTRAVENOUS

## 2021-03-08 MED ORDER — SODIUM CHLORIDE 0.9 % IV SOLN
8.0000 mg | Freq: Once | INTRAVENOUS | Status: AC
  Administered 2021-03-08: 8 mg via INTRAVENOUS
  Filled 2021-03-08: qty 4

## 2021-03-08 MED ORDER — MIDAZOLAM HCL 2 MG/2ML IJ SOLN
INTRAMUSCULAR | Status: AC
  Filled 2021-03-08: qty 2

## 2021-03-08 MED ORDER — PANTOPRAZOLE SODIUM 40 MG IV SOLR
40.0000 mg | Freq: Once | INTRAVENOUS | Status: AC
  Administered 2021-03-08: 40 mg via INTRAVENOUS
  Filled 2021-03-08: qty 40

## 2021-03-08 MED ORDER — PANTOPRAZOLE SODIUM 40 MG IV SOLR
INTRAVENOUS | Status: AC
  Filled 2021-03-08: qty 40

## 2021-03-08 MED ORDER — LIDOCAINE HCL (PF) 1 % IJ SOLN
INTRAMUSCULAR | Status: AC | PRN
  Administered 2021-03-08: 10 mL via INTRADERMAL

## 2021-03-08 MED ORDER — IOHEXOL 300 MG/ML  SOLN
50.0000 mL | Freq: Once | INTRAMUSCULAR | Status: AC | PRN
  Administered 2021-03-08: 10 mL via INTRA_ARTERIAL

## 2021-03-08 MED ORDER — IOHEXOL 300 MG/ML  SOLN
100.0000 mL | Freq: Once | INTRAMUSCULAR | Status: AC | PRN
  Administered 2021-03-08: 20 mL via INTRA_ARTERIAL

## 2021-03-08 MED ORDER — YTTRIUM 90 INJECTION: 21.7800 | INJECTION | Freq: Once | INTRAVENOUS | Status: DC | PRN

## 2021-03-08 MED ORDER — SODIUM CHLORIDE 0.9 % IV SOLN
2.0000 g | Freq: Once | INTRAVENOUS | Status: AC
  Administered 2021-03-08: 2 g via INTRAVENOUS
  Filled 2021-03-08: qty 2

## 2021-03-08 MED ORDER — SODIUM CHLORIDE 0.9 % IV SOLN: INTRAVENOUS | Status: DC

## 2021-03-08 MED ORDER — IOHEXOL 300 MG/ML  SOLN
50.0000 mL | Freq: Once | INTRAMUSCULAR | Status: AC | PRN
  Administered 2021-03-08: 25 mL via INTRA_ARTERIAL

## 2021-03-08 MED ORDER — FENTANYL CITRATE (PF) 100 MCG/2ML IJ SOLN
INTRAMUSCULAR | Status: AC
  Filled 2021-03-08: qty 2

## 2021-03-08 MED ORDER — LIDOCAINE HCL (PF) 1 % IJ SOLN
INTRAMUSCULAR | Status: AC
  Filled 2021-03-08: qty 30

## 2021-03-08 MED ORDER — MIDAZOLAM HCL 2 MG/2ML IJ SOLN
INTRAMUSCULAR | Status: AC | PRN
  Administered 2021-03-08: 0.5 mg via INTRAVENOUS
  Administered 2021-03-08: 1 mg via INTRAVENOUS

## 2021-03-08 MED ORDER — DEXAMETHASONE SODIUM PHOSPHATE 10 MG/ML IJ SOLN
8.0000 mg | Freq: Once | INTRAMUSCULAR | Status: AC
  Administered 2021-03-08: 8 mg via INTRAVENOUS
  Filled 2021-03-08: qty 1

## 2021-03-08 NOTE — H&P (Signed)
Chief Complaint: Patient was seen in consultation today for HCC/right hepatic Y-90 radioembolization.  Referring Physician(s): [name] (oncology)  Supervising Physician: Markus Daft  Patient Status: [facility] - Out-pt  History of Present Illness: Jim Little is a 75 y.o. male with a past medical history of hypertension, cirrhosis, HCC, and BPH. He was unfortunately diagnosed with [place] in 12/2020 (diagnosed on IP basis). His cancer is managed by Cassandra Heilingoetter, PA-C. He was referred to IR for liver directed therapy of [name] and met with Dr. Anselm Pancoast in consultation last month to discuss treatment options. At that time, through shared decision making, patient decided to pursue Y-90 radioembolization as management. He underwent an image-guided pre Y-90 road mapping procedure with Dr. Anselm Pancoast 03/01/2021.  Patient presents today for possible image-guided right hepatic Y-90 radioembolization. Patient awake and alert laying in bed with no complaints at this time. Rosanna Randy, Swahili medical interpreter,and RN at bedside; [name], patient's congregational RN available via speaker phone. Denies fever, chills, chest pain, dyspnea, abdominal pain, or headache.   Past Medical History:  Diagnosis Date  . BPH (benign prostatic hyperplasia)   . Cirrhosis ([place])   . Hypertension   . Splenomegaly     Past Surgical History:  Procedure Laterality Date  . BIOPSY  08/22/2020   Procedure: BIOPSY;  Surgeon: Ronnette Juniper, MD;  Location: [facility];  Service: Gastroenterology;;  . COLONOSCOPY WITH PROPOFOL N/A 08/22/2020   Procedure: COLONOSCOPY WITH PROPOFOL;  Surgeon: Ronnette Juniper, MD;  Location: [facility];  Service: Gastroenterology;  Laterality: N/A;  . ESOPHAGOGASTRODUODENOSCOPY (EGD) WITH PROPOFOL N/A 08/22/2020   Procedure: ESOPHAGOGASTRODUODENOSCOPY (EGD) WITH PROPOFOL;  Surgeon: Ronnette Juniper, MD;  Location: [facility];  Service: Gastroenterology;  Laterality: N/A;  . IR ANGIOGRAM SELECTIVE EACH  ADDITIONAL VESSEL  03/01/2021  . IR ANGIOGRAM VISCERAL SELECTIVE  03/01/2021  . IR ANGIOGRAM VISCERAL SELECTIVE  03/01/2021  . IR RADIOLOGIST EVAL & MGMT  02/06/2021  . IR US GUIDE VASC ACCESS RIGHT  03/01/2021  . UPPER GASTROINTESTINAL ENDOSCOPY  03/2018   [place] --candidiasis plus esophageal varices grade 2.  Treated with fluconazole and status post esophageal banding.  Marland Kitchen US ECHOCARDIOGRAPHY  10/2016   per overseas records from [place] - LVEF 68%; mildly calcified aortic valve, normal function otherwise    Allergies: Pork-derived products  Medications: Prior to Admission medications   Medication Sig Start Date End Date Taking? Authorizing Provider  acetaminophen (TYLENOL) 500 MG tablet Take 500 mg by mouth 2 (two) times daily as needed for mild pain.    [provider]  amLODipine (NORVASC) 5 MG tablet Take 1 tablet (5 mg total) by mouth daily. Patient not taking: No sig reported 01/31/21   [name], MD  azelastine (OPTIVAR) 0.05 % ophthalmic solution Place 1 drop into both eyes 2 (two) times daily. Patient not taking: No sig reported 01/31/20   [name], MD  benzocaine (ORAJEL) 10 % mucosal gel Use as directed in the mouth or throat 3 (three) times daily as needed for mouth pain. 01/16/21   Matilde Haymaker, MD  benzocaine (ORAJEL) 10 % mucosal gel USE AS DIRECTED IN THE MOUTH OR THROAT 3 (THREE) TIMES DAILY AS NEEDED FOR MOUTH PAIN. 01/16/21 01/16/22  Matilde Haymaker, MD  docusate sodium (COLACE) 100 MG capsule Take 1 capsule (100 mg total) by mouth daily as needed. 01/16/21 01/16/22  Matilde Haymaker, MD  docusate sodium (COLACE) 100 MG capsule TAKE 1 CAPSULE (100 MG TOTAL) BY MOUTH DAILY AS NEEDED. 01/16/21 01/16/22  Matilde Haymaker, MD  fluticasone [facility]) 50 MCG/ACT nasal spray Place 2 sprays into both nostrils daily. Patient not taking: No sig reported 03/06/20   [name], MD  folic acid (FOLVITE) 1 MG tablet Take 1 tablet (1 mg total) by mouth daily. Patient not  taking: No sig reported 05/15/20   Milus Banister C, DO  hydroxypropyl methylcellulose / hypromellose (ISOPTO TEARS / GONIOVISC) 2.5 % ophthalmic solution Place 1 drop into both eyes 4 (four) times daily as needed for dry eyes. 01/16/21   Matilde Haymaker, MD  hydroxypropyl methylcellulose / hypromellose (ISOPTO TEARS / GONIOVISC) 2.5 % ophthalmic solution PLACE 1 DROP INTO BOTH EYES 4 (FOUR) TIMES DAILY AS NEEDED FOR DRY EYES. 01/16/21 01/16/22  Matilde Haymaker, MD  lactulose (CEPHULAC) 10 g packet Take 1 packet (10 g total) by mouth as needed (constipation). Dissolve in 8 ounces of water 11/27/20   [name], MD  Olopatadine HCl 0.2 % SOLN Apply 1 drop to eye daily. Patient not taking: No sig reported 03/06/20   [name], MD  tamsulosin (FLOMAX) 0.4 MG CAPS capsule Take 1 capsule (0.4 mg total) by mouth daily. Please pack and mail to home 01/31/21   [name], MD  traMADol (ULTRAM) 50 MG tablet Take 1 tablet (50 mg total) by mouth every 12 (twelve) hours as needed for severe pain. 02/21/21   Truitt Merle, MD     History reviewed. No pertinent family history.  Social History   Socioeconomic History  . Marital status: Married    Spouse name: Not on file  . Number of children: 2  . Years of education: Not on file  . Highest education level: Not on file  Occupational History  . Not on file  Tobacco Use  . Smoking status: Never Smoker  . Smokeless tobacco: Never Used  Vaping Use  . Vaping Use: Never used  Substance and Sexual Activity  . Alcohol use: Never  . Drug use: Not Currently  . Sexual activity: Not on file  Other Topics Concern  . Not on file  Social History Narrative  . Not on file   Social Determinants of Health   Financial Resource Strain: Not on file  Food Insecurity: Not on file  Transportation Needs: Not on file  Physical Activity: Not on file  Stress: Not on file  Social Connections: Not on file     Review of Systems: A 12 point ROS discussed and pertinent  positives are indicated in the HPI above.  All other systems are negative.  Review of Systems  Constitutional: Negative for chills and fever.  Respiratory: Negative for shortness of breath and wheezing.   Cardiovascular: Negative for chest pain and palpitations.  Gastrointestinal: Negative for abdominal pain.  Neurological: Negative for headaches.  Psychiatric/Behavioral: Negative for behavioral problems and confusion.    Vital Signs: There were no vitals taken for this visit.  Physical Exam Vitals and nursing note reviewed.  Constitutional:      General: He is not in acute distress. Cardiovascular:     Rate and Rhythm: Normal rate and regular rhythm.     Heart sounds: Normal heart sounds. No murmur heard.     Comments: Right DP palpable. Pulmonary:     Effort: Pulmonary effort is normal. No respiratory distress.     Breath sounds: Normal breath sounds. No wheezing.  Abdominal:     Comments: Negative for RUQ pain.  Skin:    General: Skin is warm and dry.  Comments: Right femoral puncture site (from 03/01/2021) soft without active bleeding or hematoma.  Neurological:     Mental Status: He is alert and oriented to person, place, and time.      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 Variant anatomy was identified on the pre Y 90 mapping arteriogram. CTA ordered to further evaluate the hepatic arterial anatomy prior to treatment. EXAM: CT ANGIOGRAPHY ABDOMEN WITH AND WITHOUT CONTRAST TECHNIQUE: Multidetector CT imaging of the abdomen was performed using the standard protocol during bolus administration of intravenous contrast. Multiplanar reconstructed images and MIPs were obtained and reviewed to  evaluate the vascular anatomy. CONTRAST:  168m OMNIPAQUE IOHEXOL 350 MG/ML SOLN COMPARISON:  Visceral angiography 03/01/2021 and MRI abdomen 01/12/2021 FINDINGS: VASCULAR Aorta: Normal caliber of the abdominal aorta without significant atherosclerotic disease and no evidence for an aortic dissection. Celiac: Celiac trunk is widely patent. Main branches of the celiac trunk are the splenic artery, left gastric artery and the gastroduodenal artery. Small hepatic artery coming off the GDA that is probably supplying medial left hepatic lobe. Replaced left hepatic artery coming off the left gastric artery supplying majority of the left hepatic lobe. SMA: SMA is widely patent. First branch off the SMA is a replaced right hepatic artery. Right hepatic arteries primarily supplying the right hepatic lobe and the right hepatic tumor. SMA main branches are patent. Renals: Single right renal artery is widely patent without aneurysm, dissection or stenosis. There are 2 main left renal arteries and one of the left renal arteries has as an early bifurcation. No evidence for aneurysm, stenosis or dissection involving the left renal arteries. IMA: Patent Inflow: Proximal common iliac arteries are patent bilaterally. Veins: Main portal vein is patent. The right portal vein is thrombosed. Left portal vein is patent. Evidence for tumor thrombus involving the right hepatic vein extending into the IVC. Middle and left hepatic veins are patent. Superior mesenteric vein is patent. Splenic vein is patent. Review of the MIP images confirms the above findings. NON-VASCULAR Lower chest: 7 mm calcified nodule along the posterior pleural surface of the right lower lobe on sequence 5 image 9. No significant pleural effusions. Hepatobiliary: Heterogeneous irregular tumor in the right hepatic lobe. This is compatible with an infiltrative lesion and compatible with hepatocellular carcinoma. Difficult to accurately measure the size of this  infiltrative lesion. Evidence for tumor thrombus involving the right portal venous system and the right hepatic vein. Gallbladder appears to be up against the right side of the liver without distension or inflammatory changes. Enlargement of the left hepatic lobe related to the right portal vein thrombosis and the cirrhosis. No large discrete lesions in left hepatic lobe. No significant biliary dilatation. Pancreas: Unremarkable. No pancreatic ductal dilatation or surrounding inflammatory changes. Spleen: Normal in size without focal abnormality. Adrenals/Urinary Tract: Normal adrenal glands. Normal appearance of both kidneys without hydronephrosis. Stomach/Bowel: Fluid-filled loop of bowel in the mid abdomen on sequence 11, image 87 is incompletely imaged. Overall, there is no evidence for acute bowel inflammation or obstruction. Normal appearance of the appendix. Lymphatic: Prominent lymph node in the porta hepatis on sequence 11, image 52 measures 1.5 x 0.9 cm. This lymph node is indeterminate but suspicious. Small left periaortic lymph nodes are indeterminate. Other: No evidence  for ascites. Musculoskeletal: Multilevel degenerative changes in the spine. IMPRESSION: VASCULAR 1. Variant hepatic artery anatomy. Replaced right hepatic artery is primarily supplying the right hepatic tumor and the right hepatic lobe. There is a small hepatic artery coming off the GDA. Replaced left hepatic artery originates from the left gastric artery. 2. 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 Findings are compatible with tumor thrombus. Tumor thrombus in the right hepatic vein is protruding into the IVC. 3. Two left renal arteries. NON-VASCULAR 1. Infiltrative tumor involving the right hepatic lobe with tumor thrombus in the right portal venous system and right hepatic vein. Findings are compatible with hepatocellular carcinoma. 2. Cirrhosis. Electronically Signed   By: Markus Daft M.D.   On: 03/06/2021  17:19   NM LIVER TUMOR LOC IMFLAM SPECT 1 DAY  Result Date: 03/01/2021 CLINICAL DATA:  Hepatocellular carcinoma. Unresectable RIGHT hepatic lobe mass. Pre microsphere radio embolization arterial mapping EXAM: NUCLEAR MEDICINE LIVER SCAN TECHNIQUE: Abdominal images were obtained in multiple projections after intrahepatic arterial injection of radiopharmaceutical. SPECT imaging was performed. Lung shunt calculation was performed. RADIOPHARMACEUTICALS:  4.41mllicurie MAA TECHNETIUM TO 74M ALBUMIN AGGREGATED COMPARISON:  None. FINDINGS: The injected microaggregated albumin localizes within RIGHT hepatic lobe. No evidence of activity within the stomach, duodenum, or bowel. Calculated shunt fraction to the lungs equals 20%. IMPRESSION: 1. No significant extrahepatic radiotracer activity following intrahepatic arterial injection of MAA. 2. Lung shunt fraction equals 20% Electronically Signed   By: SSuzy BouchardM.D.   On: 03/01/2021 14:19   IR Angiogram Visceral Selective  Result Date: 03/01/2021 INDICATION: 74year old with hepatocellular carcinoma. Patient has extensive tumor throughout the right hepatic lobe with right portal vein occlusion. Patient is being evaluated for Y 90 radioembolization to the right hepatic lobe. Patient presents for pre Y90 mapping and MAA injection. EXAM: 1. VISCERAL ANGIOGRAPHY: CELIAC, SMA AND RIGHT HEPATIC ARTERY 2. ULTRASOUND GUIDANCE FOR VASCULAR ACCESS 3. INTRA ARTERIAL INJECTION OF TECHNETIUM 74M MAA MEDICATIONS: Moderate sedation ANESTHESIA/SEDATION: Moderate (conscious) sedation was employed during this procedure. A total of Versed 2.0 mg and Fentanyl 100 mcg was administered intravenously. Moderate Sedation Time: 91 minutes minutes. The patient's level of consciousness and vital signs were monitored continuously by radiology nursing throughout the procedure under my direct supervision. CONTRAST:  149 mL Omnipaque 300 FLUOROSCOPY TIME:  Fluoroscopy Time: 24 minutes, 48  seconds, 9096mGy COMPLICATIONS: None immediate. PROCEDURE: Informed consent was obtained from the patient following explanation of the procedure, risks, benefits and alternatives using a translator. The patient understands, agrees and consents for the procedure. All questions were addressed. A time out was performed prior to the initiation of the procedure. Patient was placed supine. Ultrasound confirmed a patent right common femoral artery. Ultrasound image was saved for documentation. Right groin was prepped and draped in sterile fashion. Maximal barrier sterile technique was utilized including caps, mask, sterile gowns, sterile gloves, sterile drape, hand hygiene and skin antiseptic. Skin was anesthetized using 1% lidocaine. Small incision was made. Using ultrasound guidance, a 21 gauge needle was directed into the right common femoral artery and micropuncture dilator set was placed. Five [place] sheath was placed over a Bentson wire. C2 catheter was used to cannulate the celiac trunk. Celiac arteriography was performed. C2 catheter was used to cannulate the SMA and SMA arteriography was performed. Unable to cannulate the replaced right hepatic artery with a microcatheter or wire through the C2 catheter. C2 catheter was exchanged for a Sos catheter. Sos catheter was formed within  the abdominal aorta. The SMA was cannulated. A double angled glidewire was successfully advanced into the replaced right hepatic artery and a TriNav catheter was advanced over the wire. Arteriography was performed through this catheter. The [place] catheter was removed in order to perform additional diagnostic angiography in the right hepatic artery. High-flow Renegade catheter was used to cannulate the replaced right hepatic artery using the double angled glidewire. Additional angiography was performed in the right hepatic artery. 4.4 millicuries of technetium 99 M MAA was administered through the microcatheter within the right  hepatic artery. The microcatheter and 5 French catheter were removed. Final angiography was performed through the right groin sheath. Right groin sheath was removed using the ExoSeal closure device. There was right groin hemostasis at the end of the procedure. Bandage placed over the puncture site. FINDINGS: Celiac trunk: Celiac trunk is widely patent. The splenic artery, common hepatic artery and left gastric artery are patent. Evidence for a replaced left hepatic artery coming off the left gastric artery. Main branches of the common hepatic artery are the gastroduodenal artery and a left hepatic artery. Splenic vein and main portal vein are patent. Left portal venous system appears to be patent. No significant flow in the right portal venous system and compatible with known occlusion. SMA: SMA is widely patent. There is a replaced right hepatic artery coming off the proximal SMA supplying the right side of the liver. Small proximal branches in the replaced right hepatic artery likely supplying the caudate. Microcatheter was advanced into the replaced right hepatic artery and selective angiography was performed within the right hepatic artery. Small irregular vessels throughout the right hepatic lobe and compatible with tumor vascularity. Right groin: Right external iliac artery is tortuous and patent at the end of the procedure. Right common femoral artery is patent at the end of the procedure. IMPRESSION: 1. Successful injection of technetium 57mMAA into the right hepatic artery. 2. Visceral angiography demonstrates variant hepatic anatomy. There is a large replaced right hepatic artery supplying the right hepatic lobe. The radiopharmaceutical was injected within the replaced right hepatic artery. 3. 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 4. Patient is scheduled for Y 90 radioembolization procedure on 03/08/2021. Electronically Signed   By: AMarkus DaftM.D.   On: 03/01/2021  16:50   IR Angiogram Visceral Selective  Result Date: 03/01/2021 INDICATION: 75year old with hepatocellular carcinoma. Patient has extensive tumor throughout the right hepatic lobe with right portal vein occlusion. Patient is being evaluated for Y 90 radioembolization to the right hepatic lobe. Patient presents for pre Y90 mapping and MAA injection. EXAM: 1. VISCERAL ANGIOGRAPHY: CELIAC, SMA AND RIGHT HEPATIC ARTERY 2. ULTRASOUND GUIDANCE FOR VASCULAR ACCESS 3. INTRA ARTERIAL INJECTION OF TECHNETIUM 25M MAA MEDICATIONS: Moderate sedation ANESTHESIA/SEDATION: Moderate (conscious) sedation was employed during this procedure. A total of Versed 2.0 mg and Fentanyl 100 mcg was administered intravenously. Moderate Sedation Time: 91 minutes minutes. The patient's level of consciousness and vital signs were monitored continuously by radiology nursing throughout the procedure under my direct supervision. CONTRAST:  149 mL Omnipaque 300 FLUOROSCOPY TIME:  Fluoroscopy Time: 24 minutes, 48 seconds, 9545mGy COMPLICATIONS: None immediate. PROCEDURE: Informed consent was obtained from the patient following explanation of the procedure, risks, benefits and alternatives using a translator. The patient understands, agrees and consents for the procedure. All questions were addressed. A time out was performed prior to the initiation of the procedure. Patient was placed supine. Ultrasound confirmed a patent right  common femoral artery. Ultrasound image was saved for documentation. Right groin was prepped and draped in sterile fashion. Maximal barrier sterile technique was utilized including caps, mask, sterile gowns, sterile gloves, sterile drape, hand hygiene and skin antiseptic. Skin was anesthetized using 1% lidocaine. Small incision was made. Using ultrasound guidance, a 21 gauge needle was directed into the right common femoral artery and micropuncture dilator set was placed. Five [place] vascular sheath was placed over a  Bentson wire. C2 catheter was used to cannulate the celiac trunk. Celiac arteriography was performed. C2 catheter was used to cannulate the SMA and SMA arteriography was performed. Unable to cannulate the replaced right hepatic artery with a microcatheter or wire through the C2 catheter. C2 catheter was exchanged for a Sos catheter. Sos catheter was formed within the abdominal aorta. The SMA was cannulated. A double angled glidewire was successfully advanced into the replaced right hepatic artery and a TriNav catheter was advanced over the wire. Arteriography was performed through this catheter. The [place] catheter was removed in order to perform additional diagnostic angiography in the right hepatic artery. High-flow Renegade catheter was used to cannulate the replaced right hepatic artery using the double angled glidewire. Additional angiography was performed in the right hepatic artery. 4.4 millicuries of technetium 99 M MAA was administered through the microcatheter within the right hepatic artery. The microcatheter and 5 French catheter were removed. Final angiography was performed through the right groin sheath. Right groin sheath was removed using the ExoSeal closure device. There was right groin hemostasis at the end of the procedure. Bandage placed over the puncture site. FINDINGS: Celiac trunk: Celiac trunk is widely patent. The splenic artery, common hepatic artery and left gastric artery are patent. Evidence for a replaced left hepatic artery coming off the left gastric artery. Main branches of the common hepatic artery are the gastroduodenal artery and a left hepatic artery. Splenic vein and main portal vein are patent. Left portal venous system appears to be patent. No significant flow in the right portal venous system and compatible with known occlusion. SMA: SMA is widely patent. There is a replaced right hepatic artery coming off the proximal SMA supplying the right side of the liver. Small proximal  branches in the replaced right hepatic artery likely supplying the caudate. Microcatheter was advanced into the replaced right hepatic artery and selective angiography was performed within the right hepatic artery. Small irregular vessels throughout the right hepatic lobe and compatible with tumor vascularity. Right groin: Right external iliac artery is tortuous and patent at the end of the procedure. Right common femoral artery is patent at the end of the procedure. IMPRESSION: 1. Successful injection of technetium 18mMAA into the right hepatic artery. 2. Visceral angiography demonstrates variant hepatic anatomy. There is a large replaced right hepatic artery supplying the right hepatic lobe. The radiopharmaceutical was injected within the replaced right hepatic artery. 3. 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 4. Patient is scheduled for Y 90 radioembolization procedure on 03/08/2021. Electronically Signed   By: AMarkus DaftM.D.   On: 03/01/2021 16:50   IR Angiogram Selective Each Additional Vessel  Result Date: 03/01/2021 INDICATION: 75year old with hepatocellular carcinoma. Patient has extensive tumor throughout the right hepatic lobe with right portal vein occlusion. Patient is being evaluated for Y 90 radioembolization to the right hepatic lobe. Patient presents for pre Y90 mapping and MAA injection. EXAM: 1. VISCERAL ANGIOGRAPHY: CELIAC, SMA AND RIGHT HEPATIC ARTERY 2. ULTRASOUND GUIDANCE  FOR VASCULAR ACCESS 3. INTRA ARTERIAL INJECTION OF TECHNETIUM 23M MAA MEDICATIONS: Moderate sedation ANESTHESIA/SEDATION: Moderate (conscious) sedation was employed during this procedure. A total of Versed 2.0 mg and Fentanyl 100 mcg was administered intravenously. Moderate Sedation Time: 91 minutes minutes. The patient's level of consciousness and vital signs were monitored continuously by radiology nursing throughout the procedure under my direct supervision. CONTRAST:  149 mL  Omnipaque 300 FLUOROSCOPY TIME:  Fluoroscopy Time: 24 minutes, 48 seconds, 426 mGy COMPLICATIONS: None immediate. PROCEDURE: Informed consent was obtained from the patient following explanation of the procedure, risks, benefits and alternatives using a translator. The patient understands, agrees and consents for the procedure. All questions were addressed. A time out was performed prior to the initiation of the procedure. Patient was placed supine. Ultrasound confirmed a patent right common femoral artery. Ultrasound image was saved for documentation. Right groin was prepped and draped in sterile fashion. Maximal barrier sterile technique was utilized including caps, mask, sterile gowns, sterile gloves, sterile drape, hand hygiene and skin antiseptic. Skin was anesthetized using 1% lidocaine. Small incision was made. Using ultrasound guidance, a 21 gauge needle was directed into the right common femoral artery and micropuncture dilator set was placed. Five [place] vascular sheath was placed over a Bentson wire. C2 catheter was used to cannulate the celiac trunk. Celiac arteriography was performed. C2 catheter was used to cannulate the SMA and SMA arteriography was performed. Unable to cannulate the replaced right hepatic artery with a microcatheter or wire through the C2 catheter. C2 catheter was exchanged for a Sos catheter. Sos catheter was formed within the abdominal aorta. The SMA was cannulated. A double angled glidewire was successfully advanced into the replaced right hepatic artery and a TriNav catheter was advanced over the wire. Arteriography was performed through this catheter. The [place] catheter was removed in order to perform additional diagnostic angiography in the right hepatic artery. High-flow Renegade catheter was used to cannulate the replaced right hepatic artery using the double angled glidewire. Additional angiography was performed in the right hepatic artery. 4.4 millicuries of technetium 99 M  MAA was administered through the microcatheter within the right hepatic artery. The microcatheter and 5 French catheter were removed. Final angiography was performed through the right groin sheath. Right groin sheath was removed using the ExoSeal closure device. There was right groin hemostasis at the end of the procedure. Bandage placed over the puncture site. FINDINGS: Celiac trunk: Celiac trunk is widely patent. The splenic artery, common hepatic artery and left gastric artery are patent. Evidence for a replaced left hepatic artery coming off the left gastric artery. Main branches of the common hepatic artery are the gastroduodenal artery and a left hepatic artery. Splenic vein and main portal vein are patent. Left portal venous system appears to be patent. No significant flow in the right portal venous system and compatible with known occlusion. SMA: SMA is widely patent. There is a replaced right hepatic artery coming off the proximal SMA supplying the right side of the liver. Small proximal branches in the replaced right hepatic artery likely supplying the caudate. Microcatheter was advanced into the replaced right hepatic artery and selective angiography was performed within the right hepatic artery. Small irregular vessels throughout the right hepatic lobe and compatible with tumor vascularity. Right groin: Right external iliac artery is tortuous and patent at the end of the procedure. Right common femoral artery is patent at the end of the procedure. IMPRESSION: 1. Successful injection of technetium 71mMAA into the right  hepatic artery. 2. Visceral angiography demonstrates variant hepatic anatomy. There is a large replaced right hepatic artery supplying the right hepatic lobe. The radiopharmaceutical was injected within the replaced right hepatic artery. 3. 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 4. Patient is scheduled for Y 90 radioembolization procedure on  03/08/2021. Electronically Signed   By: Markus Daft M.D.   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 Patient has extensive tumor throughout the right hepatic lobe with right portal vein occlusion. Patient is being evaluated for Y 90 radioembolization to the right hepatic lobe. Patient presents for pre Y90 mapping and MAA injection. EXAM: 1. VISCERAL ANGIOGRAPHY: CELIAC, SMA AND RIGHT HEPATIC ARTERY 2. ULTRASOUND GUIDANCE FOR VASCULAR ACCESS 3. INTRA ARTERIAL INJECTION OF TECHNETIUM 75M MAA MEDICATIONS: Moderate sedation ANESTHESIA/SEDATION: Moderate (conscious) sedation was employed during this procedure. A total of Versed 2.0 mg and Fentanyl 100 mcg was administered intravenously. Moderate Sedation Time: 91 minutes minutes. The patient's level of consciousness and vital signs were monitored continuously by radiology nursing throughout the procedure under my direct supervision. CONTRAST:  149 mL Omnipaque 300 FLUOROSCOPY TIME:  Fluoroscopy Time: 24 minutes, 48 seconds, 774 mGy COMPLICATIONS: None immediate. PROCEDURE: Informed consent was obtained from the patient following explanation of the procedure, risks, benefits and alternatives using a translator. The patient understands, agrees and consents for the procedure. All questions were addressed. A time out was performed prior to the initiation of the procedure. Patient was placed supine. Ultrasound confirmed a patent right common femoral artery. Ultrasound image was saved for documentation. Right groin was prepped and draped in sterile fashion. Maximal barrier sterile technique was utilized including caps, mask, sterile gowns, sterile gloves, sterile drape, hand hygiene and skin antiseptic. Skin was anesthetized using 1% lidocaine. Small incision was made. Using ultrasound guidance, a 21 gauge needle was directed into the right common femoral artery and micropuncture  dilator set was placed. Five [place] vascular sheath was placed over a Bentson wire. C2 catheter was used to cannulate the celiac trunk. Celiac arteriography was performed. C2 catheter was used to cannulate the SMA and SMA arteriography was performed. Unable to cannulate the replaced right hepatic artery with a microcatheter or wire through the C2 catheter. C2 catheter was exchanged for a Sos catheter. Sos catheter was formed within the abdominal aorta. The SMA was cannulated. A double angled glidewire was successfully advanced into the replaced right hepatic artery and a TriNav catheter was advanced over the wire. Arteriography was performed through this catheter. The [place] catheter was removed in order to perform additional diagnostic angiography in the right hepatic artery. High-flow Renegade catheter was used to cannulate the replaced right hepatic artery using the double angled glidewire. Additional angiography was performed in the right hepatic artery. 4.4 millicuries of technetium 99 M MAA was administered through the microcatheter within the right hepatic artery. The microcatheter and 5 French catheter were removed. Final angiography was performed through the right groin sheath. Right groin sheath was removed using the ExoSeal closure device. There was right groin hemostasis at the end of the procedure. Bandage placed over the puncture site. FINDINGS: Celiac trunk: Celiac trunk is widely patent. The splenic artery, common hepatic artery and left gastric artery are patent. Evidence for a replaced left hepatic artery coming off the left gastric artery. Main branches of the common hepatic artery are the gastroduodenal artery and a left hepatic artery. Splenic  vein and main portal vein are patent. Left portal venous system appears to be patent. No significant flow in the right portal venous system and compatible with known occlusion. SMA: SMA is widely patent. There is a replaced right hepatic artery coming off  the proximal SMA supplying the right side of the liver. Small proximal branches in the replaced right hepatic artery likely supplying the caudate. Microcatheter was advanced into the replaced right hepatic artery and selective angiography was performed within the right hepatic artery. Small irregular vessels throughout the right hepatic lobe and compatible with tumor vascularity. Right groin: Right external iliac artery is tortuous and patent at the end of the procedure. Right common femoral artery is patent at the end of the procedure. IMPRESSION: 1. Successful injection of technetium 71mMAA into the right hepatic artery. 2. Visceral angiography demonstrates variant hepatic anatomy. There is a large replaced right hepatic artery supplying the right hepatic lobe. The radiopharmaceutical was injected within the replaced right hepatic artery. 3. 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 4. Patient is scheduled for Y 90 radioembolization procedure on 03/08/2021. Electronically Signed   By: AMarkus DaftM.D.   On: 03/01/2021 16:50   IR Radiologist Eval & Mgmt  Result Date: 02/06/2021 Please refer to notes tab for details about interventional procedure. (Op Note)  NM FUSION  Result Date: 03/02/2021 : CLINICAL DATA: Hepatocellular carcinoma. Unresectable RIGHT hepatic lobe mass. Pre microsphere radio embolization arterial mapping EXAM: NUCLEAR MEDICINE LIVER SCAN TECHNIQUE: Abdominal images were obtained in multiple projections after intrahepatic arterial injection of radiopharmaceutical. SPECT imaging was performed. Lung shunt calculation was performed. RADIOPHARMACEUTICALS: 4.427mlicurie MAA TECHNETIUM TO 81M ALBUMIN AGGREGATED COMPARISON: None. FINDINGS: The injected microaggregated albumin localizes within RIGHT hepatic lobe. No evidence of activity within the stomach, duodenum, or bowel. Calculated shunt fraction to the lungs equals 20%. IMPRESSION: 1. No significant  extrahepatic radiotracer activity following intrahepatic arterial injection of MAA. 2. Lung shunt fraction equals 20% Electronically Signed   By: StSuzy Bouchard.D.   On: 03/02/2021 09:08    Labs:  CBC: Recent Labs    01/14/21 0753 01/15/21 0320 03/01/21 0906 03/08/21 0829  WBC 4.3 4.5 3.6* 3.5*  HGB 12.8* 12.8* 12.4* 12.5*  HCT 37.1* 38.5* 37.8* 38.2*  PLT 153 163 176 153    COAGS: Recent Labs    05/10/20 2148 05/11/20 0437 07/14/20 1030 01/13/21 2146 01/15/21 0320 03/01/21 0906  INR  --    < > 1.2 1.3* 1.3* 1.2  APTT 39*  --   --   --   --  40*   < > = values in this interval not displayed.    BMP: Recent Labs    05/13/20 0345 05/14/20 0455 06/01/20 1222 12/04/20 1137 01/13/21 2146 01/14/21 0753 01/15/21 0320 01/16/21 0257 03/01/21 0906  NA 136 138 136 142   < > 139 136 137 136  K 3.6 3.7 4.6 3.7   < > 4.2 4.0 3.9 4.2  CL 108 108 104 105   < > 103 102 103 104  CO2 22 25 26 24    < > 30 27 26 25   GLUCOSE 95 101* 97 80   < > 100* 100* 114* 109*  BUN 15 10 6* 6*   < > 6* 5* 6* 10  CALCIUM 8.2* 8.1* 8.6* 8.9   < > 9.3 9.0 8.9 8.9  CREATININE 0.83 0.47* 1.16 1.00   < > 0.88 0.96 0.94 0.85  GFRNONAA >  60 >60 >60 72   < > >60 >60 >60 >60  GFRAA >60 >60 >60 84  --   --   --   --   --    < > = values in this interval not displayed.    LIVER FUNCTION TESTS: Recent Labs    01/13/21 2146 01/14/21 0753 01/15/21 0320 03/01/21 0906  BILITOT 0.7 0.5 1.1 0.8  AST 62* 57* 48* 62*  ALT 20 21 20 30   ALKPHOS 110 107 108 138*  PROT 7.4 7.5 7.5 8.8*  ALBUMIN 3.2* 3.2* 3.1* 3.6     Assessment and Plan:  [place] s/p pre Y-90 road mapping procedure 03/01/2021 by Dr. Anselm Pancoast. Plan for image-guided mesenteric arteriogram with possible right hepatic Y-90 radioembolization today with Dr. Anselm Pancoast. Dr. Anselm Pancoast at bedside, discussed risks of procedure along with post-procedural restrictions. Patient is NPO. Afebrile. He does not take blood thinners. INR pending.  Rosanna Randy,  [place] medical interpreter, was used throughout today's interaction.  Risks and benefits discussed with the patient including, but not limited to bleeding, infection, vascular injury, post procedural pain, nausea, vomiting and fatigue, contrast induced renal failure, liver failure, radiation injury to the bowel, radiation induced cholecystitis, neutropenia and possible need for additional procedures. All of the patient's questions were answered, patient is agreeable to proceed. Consent signed and in chart.   Thank you for this interesting consult.  I greatly enjoyed meeting Eleftherios Dudenhoeffer and look forward to participating in their care.  A copy of this report was sent to the requesting provider on this date.  Electronically Signed: Earley Abide, PA-C 03/08/2021, 9:27 AM   I spent a total of 25 Minutes in face to face in clinical consultation, greater than 50% of which was counseling/coordinating care for HCC/right hepatic Y-90 radioembolization.

## 2021-03-08 NOTE — Progress Notes (Signed)
IR.  Patient underwent an image-guided mesenteric arteriogram with right hepatic Y-90 radioembolization via right femoral approach this AM by Dr. Anselm Pancoast.  Prescription for Prilosec (20 mg taken once daily for 30 days) given to patient per Dr. Anselm Pancoast. [name], patient's congregational RN, aware of prescription (no interpretor at bedside).  Plan to follow-up with Dr. Anselm Pancoast for televisit 2-4 weeks after discharge (IR schedulers to contact patient to set up this appointment).  Please call IR with questions/concerns.   Bea Graff Deeana Atwater, PA-C 03/08/2021, 3:23 PM

## 2021-03-08 NOTE — Discharge Instructions (Signed)
Post Y-90 Radioembolization Discharge Instructions  You have been given a radioactive material during your procedure.  While it is safe for you to be discharged home from the hospital, you need to proceed directly home.    Do not use public transportation, including air travel, lasting more than 2 hours for 1 week.  Avoid crowded public places for 1 week.  Adult visitors should try to avoid close contact with you for 1 week.    Children and pregnant females should not visit or have close contact with you for 1 week.  Items that you touch are not radioactive.  Do not sleep in the same bed as your partner for 1 week, and a condom should be used for sexual activity during the first 24 hours.  Your blood may be radioactive and caution should be used if any bleeding occurs during the recovery period.  Body fluids may be radioactive for 24 hours.  Wash your hands after voiding.  Men should sit to urinate.  Dispose of any soiled materials (flush down toilet or place in trash at home) during the first day.  Drink 6 to 8 glasses of fluids per day for 5 days to hydrate yourself.  If you need to see a doctor during the first week, you must let them know that you were treated with yttrium-90 microspheres, and will be slightly radioactive.  They can call Interventional Radiology [phone] with any questions.  Femoral Site Care  This sheet gives you information about how to care for yourself after your procedure. Your health care provider may also give you more specific instructions. If you have problems or questions, contact your health care provider. What can I expect after the procedure? After the procedure, it is common to have:  Bruising that usually fades within 1-2 weeks.  Tenderness at the site. Follow these instructions at home: Wound care  Follow instructions from your health care provider about how to take care of your insertion site. Make sure you: ? Wash your hands with soap and  water before you change your bandage (dressing). If soap and water are not available, use hand sanitizer. ? Change your dressing as told by your health care provider. ? Leave stitches (sutures), skin glue, or adhesive strips in place. These skin closures may need to stay in place for 2 weeks or longer. If adhesive strip edges start to loosen and curl up, you may trim the loose edges. Do not remove adhesive strips completely unless your health care provider tells you to do that.  Do not take baths, swim, or use a hot tub until your health care provider approves.  You may shower 24-48 hours after the procedure or as told by your health care provider. ? Gently wash the site with plain soap and water. ? Pat the area dry with a clean towel. ? Do not rub the site. This may cause bleeding.  Do not apply powder or lotion to the site. Keep the site clean and dry.  Check your femoral site every day for signs of infection. Check for: ? Redness, swelling, or pain. ? Fluid or blood. ? Warmth. ? Pus or a bad smell. Activity  For the first 2-3 days after your procedure, or as long as directed: ? Avoid climbing stairs as much as possible. ? Do not squat.  Do not lift anything that is heavier than 10 lb (4.5 kg), or the limit that you are told, until your health care provider says that it is  safe.  Rest as directed. ? Avoid sitting for a long time without moving. Get up to take short walks every 1-2 hours.  Do not drive for 24 hours if you were given a medicine to help you relax (sedative). General instructions  Take over-the-counter and prescription medicines only as told by your health care provider.  Keep all follow-up visits as told by your health care provider. This is important. Contact a health care provider if you have:  A fever or chills.  You have redness, swelling, or pain around your insertion site. Get help right away if:  The catheter insertion area swells very fast.  You  pass out.  You suddenly start to sweat or your skin gets clammy.  The catheter insertion area is bleeding, and the bleeding does not stop when you hold steady pressure on the area.  The area near or just beyond the catheter insertion site becomes pale, cool, tingly, or numb. These symptoms may represent a serious problem that is an emergency. Do not wait to see if the symptoms will go away. Get medical help right away. Call your local emergency services (911 in the [place] Do not drive yourself to the hospital. Summary  After the procedure, it is common to have bruising that usually fades within 1-2 weeks.  Check your femoral site every day for signs of infection.  Do not lift anything that is heavier than 10 lb (4.5 kg), or the limit that you are told, until your health care provider says that it is safe. This information is not intended to replace advice given to you by your health care provider. Make sure you discuss any questions you have with your health care provider. Document Revised: 07/14/2020 Document Reviewed: 07/14/2020 Elsevier Patient Education  2021 [place]. Moderate Conscious Sedation, Adult, Care After This sheet gives you information about how to care for yourself after your procedure. Your health care provider may also give you more specific instructions. If you have problems or questions, contact your health care provider. What can I expect after the procedure? After the procedure, it is common to have:  Sleepiness for several hours.  Impaired judgment for several hours.  Difficulty with balance.  Vomiting if you eat too soon. Follow these instructions at home: For the time period you were told by your health care provider:  Rest.  Do not participate in activities where you could fall or become injured.  Do not drive or use machinery.  Do not drink alcohol.  Do not take sleeping pills or medicines that cause drowsiness.  Do not make important  decisions or sign legal documents.  Do not take care of children on your own.      Eating and drinking  Follow the diet recommended by your health care provider.  Drink enough fluid to keep your urine pale yellow.  If you vomit: ? Drink water, juice, or soup when you can drink without vomiting. ? Make sure you have little or no nausea before eating solid foods.   General instructions  Take over-the-counter and prescription medicines only as told by your health care provider.  Have a responsible adult stay with you for the time you are told. It is important to have someone help care for you until you are awake and alert.  Do not smoke.  Keep all follow-up visits as told by your health care provider. This is important. Contact a health care provider if:  You are still sleepy or having trouble with  balance after 24 hours.  You feel light-headed.  You keep feeling nauseous or you keep vomiting.  You develop a rash.  You have a fever.  You have redness or swelling around the IV site. Get help right away if:  You have trouble breathing.  You have new-onset confusion at home. Summary  After the procedure, it is common to feel sleepy, have impaired judgment, or feel nauseous if you eat too soon.  Rest after you get home. Know the things you should not do after the procedure.  Follow the diet recommended by your health care provider and drink enough fluid to keep your urine pale yellow.  Get help right away if you have trouble breathing or new-onset confusion at home. This information is not intended to replace advice given to you by your health care provider. Make sure you discuss any questions you have with your health care provider. Document Revised: 03/10/2020 Document Reviewed: 10/07/2019 Elsevier Patient Education  2021 [employer].

## 2021-03-08 NOTE — Telephone Encounter (Signed)
0715-Patient transported to [facility] for procedure scheduled for today. [facility] provided the ride.   0830- Confirmed that patient arrived safely and has interpreter on site.   0900-  Assisted patient with pre procedure education and instructions over the phone. Provided my cell number to the nurse to be contacted if needed.  Honor Loh RN BSn PCCN  [facility] [phone]

## 2021-03-08 NOTE — Telephone Encounter (Signed)
Contacted patient som [place] and educated him on post procedure care. Emphasized the importance of self isolation up to one week due to radioactive emission. Advised patient not to interact with children and pregnant women and therefore patient not share a room or bed for up to one week.  I will call wife after she returns home from work as well.  Honor Loh RN BSn PCCN  [facility] Nurse [phone]

## 2021-03-08 NOTE — Procedures (Signed)
Interventional Radiology Procedure:   Indications: Hepatocellular carcinoma  Procedure: Y90 radioembolization to right hepatic lobe / tumor  Findings: Microcatheter placed in right hepatic near main bifurcation.  Entire dose given and antegrade flow in artery at end of procedure.  Right groin sheath removed with Exoseal closure device.  Complications: None     EBL: less than 10 ml  Plan: Nuclear medicine scan and then plan to discharge in 4 hours.    Danity Schmelzer R. Anselm Pancoast, MD  Pager: [phone]

## 2021-03-10 ENCOUNTER — Telehealth: Payer: Self-pay

## 2021-03-10 ENCOUNTER — Other Ambulatory Visit: Payer: Self-pay | Admitting: Family Medicine

## 2021-03-10 ENCOUNTER — Other Ambulatory Visit: Payer: Self-pay

## 2021-03-10 MED ORDER — OMEPRAZOLE 40 MG PO CPDR: 40.0000 mg | DELAYED_RELEASE_CAPSULE | Freq: Every day | ORAL | 3 refills | Status: DC

## 2021-03-10 NOTE — Telephone Encounter (Signed)
Patient called c/o pain to the "ribs". Patient has not taken tramadol in he last 24 hours. Patient advised to take tramadol as needed for pain as previously prescribed.  Honor Loh RN BSn PCCN  [facility] Nurse [phone]

## 2021-03-22 ENCOUNTER — Other Ambulatory Visit (HOSPITAL_COMMUNITY): Payer: Medicaid Other

## 2021-03-22 ENCOUNTER — Ambulatory Visit (HOSPITAL_COMMUNITY): Payer: Medicaid Other

## 2021-03-22 NOTE — Congregational Nurse Program (Signed)
  Dept: [facility] Nurse Program Note  Date of Encounter: 03/21/21  Past Medical History: Past Medical History:  Diagnosis Date  . BPH (benign prostatic hyperplasia)   . Cirrhosis ([place])   . Hypertension   . Splenomegaly         Encounter Details: Home visit completed. Found patient sitting outside. He feel weak and denies falling. Medication reviewed and patient is compliant. He reports food insecurities and family has difficulties accessing their food stump. I will call food on their behalf to have the issue resolved.Informed patient son to visit local food banks for assistance.  Honor Loh RN BSn PCCN  [facility] Nurse [phone]

## 2021-03-26 ENCOUNTER — Telehealth: Payer: Self-pay

## 2021-03-26 NOTE — Telephone Encounter (Signed)
Patient called and complaining of pain to right leg.He  associates the pain  with right femoral puncture site s/p IR procedure for [facility] that was done mid April. I have advised him to take pain medication tramadol as needed as ordered. He says pain is not well controlled. Appointment scheduled for tomorrow 03/27/21 at 1050am with PCP.  Honor Loh RN BSn PCCN  [facility] Nurse [phone]

## 2021-03-27 ENCOUNTER — Other Ambulatory Visit (HOSPITAL_COMMUNITY)
Admission: RE | Admit: 2021-03-27 | Discharge: 2021-03-27 | Disposition: A | Discharge status: Home / Self Care | Payer: Medicaid Other | Source: Ambulatory Visit | Attending: Family Medicine | Admitting: Family Medicine

## 2021-03-27 ENCOUNTER — Telehealth: Payer: Self-pay

## 2021-03-27 ENCOUNTER — Ambulatory Visit (INDEPENDENT_AMBULATORY_CARE_PROVIDER_SITE_OTHER): Payer: Medicaid Other | Admitting: Family Medicine

## 2021-03-27 ENCOUNTER — Ambulatory Visit (HOSPITAL_COMMUNITY): Payer: Medicaid Other

## 2021-03-27 ENCOUNTER — Other Ambulatory Visit: Payer: Self-pay

## 2021-03-27 VITALS — BP 140/80 | HR 96 | Wt 134.0 lb

## 2021-03-27 DIAGNOSIS — N342 Other urethritis: Secondary | ICD-10-CM | POA: Diagnosis not present

## 2021-03-27 DIAGNOSIS — N5082 Scrotal pain: Secondary | ICD-10-CM | POA: Insufficient documentation

## 2021-03-27 LAB — POCT URINALYSIS DIP (MANUAL ENTRY)
Glucose, UA: NEGATIVE mg/dL
Nitrite, UA: POSITIVE — AB
Protein Ur, POC: 100 mg/dL — AB
Spec Grav, UA: 1.02 (ref 1.010–1.025)
Urobilinogen, UA: >=8 E.U./dL — AB
pH, UA: 6 (ref 5.0–8.0)

## 2021-03-27 MED ORDER — TRAMADOL HCL 50 MG PO TABS: 50.0000 mg | ORAL_TABLET | Freq: Two times a day (BID) | ORAL | 0 refills | Status: DC | PRN

## 2021-03-27 MED ORDER — CEPHALEXIN 500 MG PO CAPS
500.0000 mg | ORAL_CAPSULE | Freq: Two times a day (BID) | ORAL | 0 refills | Status: AC
Start: 2021-03-27 — End: 2021-04-01

## 2021-03-27 NOTE — Telephone Encounter (Signed)
Confirmed patient is done with appointment and ready for a ride back home. Transportation called and scheduled accordingly.Discussed new antibiotic prescription and advised on compliance.   [name] Jadence Kinlaw RN BSN PCCN [phone]

## 2021-03-27 NOTE — Assessment & Plan Note (Addendum)
Ongoing for >2 weeks s/p femoral catheter procedure for HCC. Increasing swelling and pain with possibly associated burning sensation with urination and blood in urine. UA suggestive of UTI will treat and follow up culture. Consider infection introduced at puncture site in right femoral region at the time of above procedure for scrotal pain.  Consider sexually transmitted disease.  Consider torsion but suspicion for this is lower. -US scrotum scheduled at time of visit for  -F/u culture -Urine ancillary GC/Ch -Refill of tramadol for pain relief - Follow-up with PCP in 1 week

## 2021-03-27 NOTE — Assessment & Plan Note (Signed)
UA suggestive of UTI.  Will treat and follow culture.  Likely due to ongoing urinary retention.  Patient will likely need to follow-up with PCP. - Keflex - Follow-up culture - Continue Flomax - Follow-up with PCP in 1 week

## 2021-03-27 NOTE — Patient Instructions (Addendum)
It was wonderful to see you today.  Please bring ALL of your medications with you to every visit.   Today we talked about:  Going for an ultrasound. Your appointment and address is below.   I have refilled your pain medication (tramadol).   Go to ED if pain and swelling continues to worsen and unrelieved by pain medication.   You have a urinary tract infection. I have sent in medication to treat this (keflex to be take 2 times daily for a total of 5 days).   Please be sure to schedule follow up at the front  desk before you leave today.   Please call the clinic at [phone] if your symptoms worsen or you have any concerns. It was our pleasure to serve you.  Dr. Janus Molder

## 2021-03-27 NOTE — Progress Notes (Signed)
    SUBJECTIVE:   CHIEF COMPLAINT / HPI:   Jim Little is a 75 yo M who presents to [facility] for the issues below.   Right scrotal pain Experiencing right testicle swelling and pain since femoral catheter procedure for [facility] mid April.  Has not worsened in but is creating a sharp pain.  Patient was taken tramadol for pain relief and was helping but he is now out.  Has burning sensation with urinating and has not noticed blood in urine.  Is sexually active with wife for 10 years does not use protection.  Denies penile discharge.  Denies back pain.  PERTINENT  PMH / PSH: History of urinary retention  OBJECTIVE:   BP 140/80   Pulse 96   Wt 134 lb (60.8 kg)   SpO2 98%   BMI 21.63 kg/m   General: Appears well, no acute distress. Age appropriate. Male genitalia: normal, penis: no lesions or discharge. testes: no hernias, abnormal findings: right sided swelling and erythema of scrotum and teste. Normal size left sided teste. Right tender to palpation. Pain in inguinal area without edema or swelling.  Results for orders placed or performed in visit on 03/27/21 (from the past 24 hour(s))  POCT urinalysis dipstick     Status: Abnormal   Collection Time: 03/27/21 11:37 AM  Result Value Ref Range   Color, UA orange (A) yellow   Clarity, UA cloudy (A) clear   Glucose, UA negative negative mg/dL   Bilirubin, UA small (A) negative   Ketones, POC UA trace (5) (A) negative mg/dL   Spec Grav, UA 1.020 1.010 - 1.025   Blood, UA large (A) negative   pH, UA 6.0 5.0 - 8.0   Protein Ur, POC =100 (A) negative mg/dL   Urobilinogen, UA >=8.0 (A) 0.2 or 1.0 E.U./dL   Nitrite, UA Positive (A) Negative   Leukocytes, UA Large (3+) (A) Negative    ASSESSMENT/PLAN:   Scrotal pain Ongoing for >2 weeks s/p femoral catheter procedure for HCC. Increasing swelling and pain with possibly associated burning sensation with urination and blood in urine. UA suggestive of UTI will treat and follow up culture. Consider  infection introduced at puncture site in right femoral region at the time of above procedure for scrotal pain.  Consider sexually transmitted disease.  Consider torsion but suspicion for this is lower. -US scrotum scheduled at time of visit for  -F/u culture -Urine ancillary GC/Ch -Refill of tramadol for pain relief - Follow-up with PCP in 1 week  Infective urethritis UA suggestive of UTI.  Will treat and follow culture.  Likely due to ongoing urinary retention.  Patient will likely need to follow-up with PCP. - Keflex - Follow-up culture - Continue Flomax - Follow-up with PCP in 1 week   Gerlene Fee, [place]

## 2021-03-28 LAB — URINE CYTOLOGY ANCILLARY ONLY
Chlamydia: NEGATIVE
Comment: NEGATIVE
Comment: NORMAL
Neisseria Gonorrhea: NEGATIVE

## 2021-04-04 ENCOUNTER — Telehealth: Payer: Self-pay

## 2021-04-04 NOTE — Telephone Encounter (Signed)
I contacted patient for follow up. Patient has completed  antibiotics and  reports to be doing well with no issues at the puncture site. I have reminded Jim Little of nest appointment scheduled for May 17th.  Honor Loh RN BSn P[facility]N  [facility] [phone]

## 2021-04-09 ENCOUNTER — Telehealth: Payer: Self-pay

## 2021-04-09 NOTE — Congregational Nurse Program (Signed)
  Dept: [place] Nurse Program Note  Date of Encounter: 04/09/2021  Past Medical History: Past Medical History:  Diagnosis Date  . BPH (benign prostatic hyperplasia)   . Cirrhosis ([place])   . Hypertension   . Splenomegaly     Encounter Details:  CNP Questionnaire - 04/09/21 2325      Questionnaire   Do you give verbal consent to treat you today? Yes    Visit Setting Home    Location Patient Served At [place]    Patient Status Refugee    Medical Provider Yes    Insurance Medicaid    Intervention Advocate;Support;Counsel;Educate;Refer    Transportation Need transportation assistance;Provided transportation assistance ([facility],bus pass, taxi voucher, etc.)    Medication Have medication insecurities;Provided medication assistance (Pharmacies, drug rep, etc.)    Referrals PCP - [place] visit completed.  Medication inspected. Medication delivered today  in blister packs include Amlodipine 5mg  once daily, Omeprazole 40mg  once daily and Tamsuloson 0.4mg  daily. Other as needed medication include Lactulose and Tramadol.  Patient re-educated on medication compliance and reminded of appointment tomorrow with PCP  Honor Loh RN BSn [place] [phone]

## 2021-04-09 NOTE — Telephone Encounter (Signed)
I have contacted Jim Little to remind him of appointment tomorrow with PCP.  Honor Loh RN BSn P[facility]N  [facility] [phone]

## 2021-04-10 ENCOUNTER — Ambulatory Visit (INDEPENDENT_AMBULATORY_CARE_PROVIDER_SITE_OTHER): Payer: Medicaid Other | Admitting: Family Medicine

## 2021-04-10 ENCOUNTER — Other Ambulatory Visit: Payer: Self-pay

## 2021-04-10 VITALS — BP 145/80 | HR 82 | Wt 137.4 lb

## 2021-04-10 DIAGNOSIS — N342 Other urethritis: Secondary | ICD-10-CM

## 2021-04-10 DIAGNOSIS — K4090 Unilateral inguinal hernia, without obstruction or gangrene, not specified as recurrent: Secondary | ICD-10-CM | POA: Diagnosis present

## 2021-04-10 DIAGNOSIS — N5082 Scrotal pain: Secondary | ICD-10-CM

## 2021-04-10 MED ORDER — TRAMADOL HCL 50 MG PO TABS: 50.0000 mg | ORAL_TABLET | Freq: Two times a day (BID) | ORAL | 0 refills | Status: DC | PRN

## 2021-04-10 NOTE — Congregational Nurse Program (Signed)
  Dept: [place] Nurse Program Note  Date of Encounter: 04/10/2021  Past Medical History: Past Medical History:  Diagnosis Date  . BPH (benign prostatic hyperplasia)   . Cirrhosis ([place])   . Hypertension   . Splenomegaly     Encounter Details: 2pm 04/10/21 Transportation assistance provided to and from doctors appointment today.I assisted patient with registration upon arrival to the clinic and assisted patient get into an [place] for transportation back home   Honor Loh RN BSn [place] Nurse [phone]

## 2021-04-10 NOTE — Progress Notes (Addendum)
    SUBJECTIVE:   CHIEF COMPLAINT / HPI:   Jim Little (MRN: [id number]) is a 75 y.o. male with a history of cirrhosis, BPH, and HTN who presents for evaluation of a hernia. This visit was completed using Swahili interpreter Jim Little [phone] and another interpreter after the initial call was disconnected.  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 He does report that 5 years ago, he was in [place], and he noticed the same hernia then. He ate some medicinal leaves while there, and he reported resolution of symptoms. There has not been additional episodes between now and then. He states his pain today is in the right inguinal area and rates it 8/10 in intensity. It has been swollen for a while, but it has swollen much more over the last day. He describes the color of the skin overlying the hernia as "black." He cannot reduce the hernia because it is too large and hurts to press on it. He took the tramadol prescribed at his last visit here for pain, but he is out of the medication now. He also did not attend his ultrasound appointment of his scrotum for similar, but milder, symptoms but cannot explain why. There is no radiation of his current pain, but he feels as if the hernia makes his "butt feel heavy." He does report feeling feverish, having cold chills, chronic constipation that has increased over this 2 week time period, and feeling full after eating a small amount of food. He has been taking dulcolax since yesterday, and it allowed him to have a "large" bowel movement.  Infective urethritis Patient endorses completing his antibiotic regimen since last visit. He has had full resolution of his symptoms. He denies any dysuria.  PERTINENT  PMH / PSH: UTI, BPH, history of urinary retention  OBJECTIVE:   BP (!) 145/80   Pulse 82   Wt 137 lb 6.4 oz (62.3 kg)   SpO2 98%   BMI 22.18 kg/m    PHYSICAL EXAM  GEN: Uncomfortable  when standing from sitting, but in NAD; otherwise attentive and conversant GU: Penis is normal. Right scrotum markedly swollen. No transillumination. Hard but nonpainful to palpation. Can appreciate root of bowel in right inguinal canal. Left scrotum and testis normal.  ASSESSMENT/PLAN   Infective urethritis Antibiotic course complete.  -Continue flomax  Scrotal hernia Patient has worsening, firm swelling of the right scrotum without transillumination. He has also had pain localized the right inguinal area. He has endorsed feeling feverish, having cold chills, and experiencing worsening constipation. There is no erythema or warmth on exam. Patient has not experienced any nausea or vomiting. He did not go to his ultrasound appointment created at last visit. Worsening clinical presentation is concerning for right indirect hernia with risk of incarceration. Acute incarceration not suspected at this time due to lack of nausea and vomiting as well as erythema and warmth in the area. -Urgent referral to General Surgery for further evaluation -Refill tramadol 50 mg for pain control -Counseled on return precautions to ED if worsening pain, fever, nausea, or vomiting -Follow-up with PCP in 2 weeks   [place]  I was personally present and re-performed the exam and MDM and verified the service and findings are accurately documented in the student's note. Marcus Hook, DO 04/11/21 9:50 AM

## 2021-04-10 NOTE — Patient Instructions (Addendum)
We believe you have a hernia. This is when your bowel moves through a weakening in your abdominal wall.  We are referring you to general surgery for further evaluation.   If you haven't already, sign up for My Chart to have easy access to your labs results, and communication with your primary care physician.  Please call the clinic at [phone] if your symptoms worsen or you have any concerns. It was our pleasure to serve you.  Dr. Janus Molder

## 2021-04-11 DIAGNOSIS — K4090 Unilateral inguinal hernia, without obstruction or gangrene, not specified as recurrent: Secondary | ICD-10-CM | POA: Insufficient documentation

## 2021-04-11 NOTE — Assessment & Plan Note (Signed)
Antibiotic course complete.  -Continue flomax

## 2021-04-11 NOTE — Progress Notes (Signed)
I was personally present and re-performed the exam and MDM and verified the service and findings are accurately documented in the student's note. [place], DO 04/11/21 9:57 AM

## 2021-04-11 NOTE — Assessment & Plan Note (Signed)
Patient has worsening, firm swelling of the right scrotum without transillumination. He has also had pain localized the right inguinal area. He has endorsed feeling feverish, having cold chills, and experiencing worsening constipation. There is no erythema or warmth on exam. Patient has not experienced any nausea or vomiting. He did not go to his ultrasound appointment created at last visit. Worsening clinical presentation is concerning for right indirect hernia with risk of incarceration. Acute incarceration not suspected at this time due to lack of nausea and vomiting as well as erythema and warmth in the area. -Urgent referral to General Surgery for further evaluation -Refill tramadol 50 mg for pain control -Counseled on return precautions to ED if worsening pain, fever, nausea, or vomiting -Follow-up with PCP in 2 weeks

## 2021-04-29 ENCOUNTER — Telehealth: Payer: Self-pay

## 2021-04-29 NOTE — Telephone Encounter (Signed)
Contacted patient to remind him of urology appointment tomorrow.  Honor Loh RN BSn PCCN  [facility] [phone]

## 2021-04-30 NOTE — Congregational Nurse Program (Signed)
  Dept: [place] Nurse Program Note  Date of Encounter: 04/30/2021  Past Medical History: Past Medical History:  Diagnosis Date  . BPH (benign prostatic hyperplasia)   . Cirrhosis ([place])   . Hypertension   . Splenomegaly     Encounter Details: Accompanied Jim Little to [facility] for appointment.Transportation provided by [employer].  Honor Loh RN BSn PCCN  [facility] [phone]

## 2021-05-01 ENCOUNTER — Telehealth: Payer: Self-pay

## 2021-05-01 NOTE — Telephone Encounter (Signed)
Contacted patient with appointment reminder.  Honor Loh RN BSn P[facility]N  [facility] [phone]

## 2021-05-14 NOTE — Congregational Nurse Program (Signed)
  Dept: [place] Nurse Program Note  Date of Encounter: 05/14/2021  Past Medical History: Past Medical History:  Diagnosis Date   BPH (benign prostatic hyperplasia)    Cirrhosis ([place])    Hypertension    Splenomegaly     Encounter Details:  CNP Questionnaire - 05/14/21 1700       Questionnaire   Do you give verbal consent to treat you today? Yes           1700hrs- Home visit completed. Medication inspected,Patient NOT compliant with medication. Patient stated that he does not understand his medications.Re-educated. I wrote medication instructions in swahili and wrote on pill bottles/blister packs as well. His wife will be assisting with medication administration.  Honor Loh RN BSn P[facility]N  [facility] [phone]

## 2021-05-16 ENCOUNTER — Telehealth: Payer: Self-pay

## 2021-05-16 NOTE — Telephone Encounter (Signed)
Patient has an appointment with [facility] today at 2 PM. Patient requesting transportation assistance. Ride scheduled with [facility] transportation services. Pickup time 2:10 PM. Patient made aware.  Honor Loh RN BSn P[facility]N  [facility] [phone]

## 2021-05-17 ENCOUNTER — Other Ambulatory Visit: Payer: Self-pay | Admitting: Diagnostic Radiology

## 2021-05-17 ENCOUNTER — Other Ambulatory Visit (HOSPITAL_COMMUNITY): Payer: Self-pay | Admitting: Student

## 2021-05-17 DIAGNOSIS — C220 Liver cell carcinoma: Secondary | ICD-10-CM

## 2021-05-22 ENCOUNTER — Telehealth: Payer: Self-pay

## 2021-05-22 NOTE — Telephone Encounter (Signed)
I have contacted Jim Little and his wife to remind them of tomorrow appointment. Transportation assistance provided.  Honor Loh RN BSn P[facility]N  [facility] [phone]

## 2021-05-24 ENCOUNTER — Other Ambulatory Visit: Payer: Self-pay

## 2021-05-24 ENCOUNTER — Encounter: Payer: Self-pay | Admitting: Hematology

## 2021-05-24 ENCOUNTER — Telehealth: Payer: Self-pay | Admitting: Hematology

## 2021-05-24 ENCOUNTER — Inpatient Hospital Stay: Payer: Medicaid Other

## 2021-05-24 ENCOUNTER — Telehealth: Payer: Self-pay

## 2021-05-24 ENCOUNTER — Inpatient Hospital Stay: Payer: Medicaid Other | Attending: Hematology | Admitting: Hematology

## 2021-05-24 VITALS — BP 173/72 | HR 74 | Temp 97.2°F | Resp 19 | Ht 66.0 in | Wt 138.0 lb

## 2021-05-24 DIAGNOSIS — C220 Liver cell carcinoma: Secondary | ICD-10-CM | POA: Insufficient documentation

## 2021-05-24 DIAGNOSIS — K7460 Unspecified cirrhosis of liver: Secondary | ICD-10-CM | POA: Diagnosis not present

## 2021-05-24 DIAGNOSIS — R162 Hepatomegaly with splenomegaly, not elsewhere classified: Secondary | ICD-10-CM | POA: Diagnosis not present

## 2021-05-24 DIAGNOSIS — N5089 Other specified disorders of the male genital organs: Secondary | ICD-10-CM | POA: Insufficient documentation

## 2021-05-24 DIAGNOSIS — I81 Portal vein thrombosis: Secondary | ICD-10-CM | POA: Insufficient documentation

## 2021-05-24 LAB — CMP (CANCER CENTER ONLY)
ALT: 20 U/L (ref 0–44)
AST: 39 U/L (ref 15–41)
Albumin: 3.4 g/dL — ABNORMAL LOW (ref 3.5–5.0)
Alkaline Phosphatase: 168 U/L — ABNORMAL HIGH (ref 38–126)
Anion gap: 7 (ref 5–15)
BUN: 8 mg/dL (ref 8–23)
CO2: 25 mmol/L (ref 22–32)
Calcium: 9.3 mg/dL (ref 8.9–10.3)
Chloride: 106 mmol/L (ref 98–111)
Creatinine: 0.92 mg/dL (ref 0.61–1.24)
GFR, Estimated: >60 mL/min (ref >60)
Glucose, Bld: 78 mg/dL (ref 70–99)
Potassium: 4.1 mmol/L (ref 3.5–5.1)
Sodium: 138 mmol/L (ref 135–145)
Total Bilirubin: 0.9 mg/dL (ref 0.3–1.2)
Total Protein: 9 g/dL — ABNORMAL HIGH (ref 6.5–8.1)

## 2021-05-24 LAB — CBC WITH DIFFERENTIAL (CANCER CENTER ONLY)
Abs Immature Granulocytes: 0.01 10*3/uL (ref 0.00–0.07)
Basophils Absolute: 0 10*3/uL (ref 0.0–0.1)
Basophils Relative: 1 %
Eosinophils Absolute: 0.1 10*3/uL (ref 0.0–0.5)
Eosinophils Relative: 4 %
HCT: 36 % — ABNORMAL LOW (ref 39.0–52.0)
Hemoglobin: 12.3 g/dL — ABNORMAL LOW (ref 13.0–17.0)
Immature Granulocytes: 0 %
Lymphocytes Relative: 31 %
Lymphs Abs: 1.3 10*3/uL (ref 0.7–4.0)
MCH: 34.1 pg — ABNORMAL HIGH (ref 26.0–34.0)
MCHC: 34.2 g/dL (ref 30.0–36.0)
MCV: 99.7 fL (ref 80.0–100.0)
Monocytes Absolute: 0.8 10*3/uL (ref 0.1–1.0)
Monocytes Relative: 19 %
Neutro Abs: 1.8 10*3/uL (ref 1.7–7.7)
Neutrophils Relative %: 45 %
Platelet Count: 138 10*3/uL — ABNORMAL LOW (ref 150–400)
RBC: 3.61 MIL/uL — ABNORMAL LOW (ref 4.22–5.81)
RDW: 14.6 % (ref 11.5–15.5)
WBC Count: 4 10*3/uL (ref 4.0–10.5)
nRBC: 0 % (ref 0.0–0.2)

## 2021-05-24 LAB — PROTIME-INR
INR: 1.2 (ref 0.8–1.2)
Prothrombin Time: 15.1 seconds (ref 11.4–15.2)

## 2021-05-24 NOTE — Telephone Encounter (Signed)
Called patient and reminded him of today`s appointment. Transportation assistance provided.  Honor Loh RN BSn P[facility]N  [facility] [phone]

## 2021-05-24 NOTE — Progress Notes (Signed)
[place]   [phone] Fax:([phone]) [phone]   Clinic Follow up Note   Patient Care Team: [name], MD as PCP - General (Family Medicine) [name], RN as [place] Management Jonnie Finner, RN as Oncology Nurse Navigator Heilingoetter, Tobe Sos, PA-C as Physician Assistant (Physician Assistant) Truitt Merle, MD as Consulting Physician (Oncology)  Date of Service:  05/24/2021  CHIEF COMPLAINT: f/u of [place]  SUMMARY OF ONCOLOGIC HISTORY: Oncology History Overview Note  Cancer Staging Hepatocellular carcinoma [facility]) Staging form: Liver, AJCC 8th Edition - Clinical stage from 02/09/2021: Stage IIIA (cT3, cN0, cM0) - Signed by Truitt Merle, MD on 02/09/2021 Stage prefix: Initial diagnosis    Hepatocellular carcinoma ([place])  05/12/2020 Imaging   MRI Liver  IMPRESSION: 1. Geographic lesion in the inferior medial aspect of the RIGHT hepatic lobe has some imaging features suggesting focal fatty infiltration. However, atypical enhancement pattern. Recommend follow-up contrast MRI in 3 to 6 months to re-evaluate. 2. No additional lesion liver. 3. Morphologic changes consistent with cirrhosis.   01/12/2021 Imaging   MRI Abdomen  IMPRESSION: 1. Advanced cirrhotic changes involving the liver. There is a large infiltrating mass versus numerous confluent masses involving the right hepatic lobe and consistent with hepatocellular carcinoma. Associated thrombosed middle and portal veins. No definite left hepatic lobe lesions. LI-RADS 5. 2. Borderline splenomegaly. 3. No ascites or abdominal wall hernia.     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 The ultrasound correlate of the previously demonstrated right liver tumor is heterogeneous tissue  with ill-defined margin.   In addition to the right-sided tumor, there are several small hyperechoic foci of approximately 1 cm, potentially additional [place] foci versus regenerative nodules.   Cirrhosis and splenomegaly   01/15/2021 Imaging   CT Chest  IMPRESSION: 1. No evidence of metastatic disease in the chest. 2. Cirrhotic morphology of the liver with ill-defined, heterogeneous mass of the right lobe of the liver and occlusion of the right portal vein, in keeping with known hepatocellular carcinoma, better demonstrated by recent MR.   02/09/2021 Initial Diagnosis   Hepatocellular carcinoma ([place])    02/09/2021 Cancer Staging   Staging form: Liver, AJCC 8th Edition - Clinical stage from 02/09/2021: Stage IIIA (cT3, cN0, cM0) - Signed by Truitt Merle, MD on 02/09/2021  Stage prefix: Initial diagnosis       CURRENT THERAPY:  Surveillance, s/p Y90  INTERVAL HISTORY:  Willson Lipa is here for a follow up of [place]. He was last seen by me on 02/21/21. He presents to the clinic accompanied by our Swahili interpretor. He connected [place] with his nurse advocate, [name], by phone. He denies any abdomen pain, but reports right groin pain following Y90 treatment.   All other systems were reviewed with the patient and are negative.  MEDICAL HISTORY:  Past Medical History:  Diagnosis Date   BPH (benign prostatic hyperplasia)    Cirrhosis ([place])    Hypertension    Splenomegaly     SURGICAL HISTORY: Past Surgical History:  Procedure Laterality Date   BIOPSY  08/22/2020   Procedure: BIOPSY;  Surgeon: Ronnette Juniper, MD;  Location: [facility];  Service: Gastroenterology;;   COLONOSCOPY WITH PROPOFOL N/A 08/22/2020   Procedure: COLONOSCOPY WITH PROPOFOL;  Surgeon: Ronnette Juniper, MD;  Location: [facility];  Service: Gastroenterology;  Laterality: N/A;  ESOPHAGOGASTRODUODENOSCOPY (EGD) WITH PROPOFOL N/A 08/22/2020   Procedure: ESOPHAGOGASTRODUODENOSCOPY (EGD) WITH PROPOFOL;  Surgeon: Ronnette Juniper,  MD;  Location: [facility];  Service: Gastroenterology;  Laterality: N/A;   IR ANGIOGRAM SELECTIVE EACH ADDITIONAL VESSEL  03/01/2021   IR ANGIOGRAM SELECTIVE EACH ADDITIONAL VESSEL  03/08/2021   IR ANGIOGRAM VISCERAL SELECTIVE  03/01/2021   IR ANGIOGRAM VISCERAL SELECTIVE  03/01/2021   IR ANGIOGRAM VISCERAL SELECTIVE  03/08/2021   IR EMBO TUMOR ORGAN ISCHEMIA INFARCT INC GUIDE ROADMAPPING  03/08/2021   IR RADIOLOGIST EVAL & MGMT  02/06/2021   IR US GUIDE VASC ACCESS RIGHT  03/01/2021   IR US GUIDE VASC ACCESS RIGHT  03/08/2021   UPPER GASTROINTESTINAL ENDOSCOPY  03/2018   [place] --candidiasis plus esophageal varices grade 2.  Treated with fluconazole and status post esophageal banding.   US ECHOCARDIOGRAPHY  10/2016   per overseas records from [place] - LVEF 68%; mildly calcified aortic valve, normal function otherwise    I have reviewed the social history and family history with the patient and they are unchanged from previous note.  ALLERGIES:  is allergic to pork-derived products.  MEDICATIONS:  Current Outpatient Medications  Medication Sig Dispense Refill   acetaminophen (TYLENOL) 500 MG tablet Take 500 mg by mouth 2 (two) times daily as needed for mild pain.     amLODipine (NORVASC) 5 MG tablet Take 1 tablet (5 mg total) by mouth daily. 90 tablet 3   azelastine (OPTIVAR) 0.05 % ophthalmic solution Place 1 drop into both eyes 2 (two) times daily. (Patient not taking: No sig reported) 6 mL 12   benzocaine (ORAJEL) 10 % mucosal gel Use as directed in the mouth or throat 3 (three) times daily as needed for mouth pain. (Patient not taking: Reported on 03/08/2021) 7.1 g 0   benzocaine (ORAJEL) 10 % mucosal gel USE AS DIRECTED IN THE MOUTH OR THROAT 3 (THREE) TIMES DAILY AS NEEDED FOR MOUTH PAIN. (Patient not taking: Reported on 03/08/2021) 7 g 0   docusate sodium (COLACE) 100 MG capsule Take 1 capsule (100 mg total) by mouth daily as needed. 60 capsule 0   docusate sodium (COLACE) 100 MG  capsule TAKE 1 CAPSULE (100 MG TOTAL) BY MOUTH DAILY AS NEEDED. 60 capsule 0   fluticasone (FLONASE) 50 MCG/ACT nasal spray Place 2 sprays into both nostrils daily. (Patient not taking: No sig reported) 16 g 6   folic acid (FOLVITE) 1 MG tablet Take 1 tablet (1 mg total) by mouth daily. (Patient not taking: No sig reported) 90 tablet 3   hydroxypropyl methylcellulose / hypromellose (ISOPTO TEARS / GONIOVISC) 2.5 % ophthalmic solution Place 1 drop into both eyes 4 (four) times daily as needed for dry eyes. 15 mL 0   hydroxypropyl methylcellulose / hypromellose (ISOPTO TEARS / GONIOVISC) 2.5 % ophthalmic solution PLACE 1 DROP INTO BOTH EYES 4 (FOUR) TIMES DAILY AS NEEDED FOR DRY EYES. 15 mL 0   lactulose (CEPHULAC) 10 g packet Take 1 packet (10 g total) by mouth as needed (constipation). Dissolve in 8 ounces of water 30 each 3   Olopatadine HCl 0.2 % SOLN Apply 1 drop to eye daily. (Patient not taking: No sig reported) 2.5 mL 3   omeprazole (PRILOSEC) 40 MG capsule Take 1 capsule (40 mg total) by mouth daily. 90 capsule 3   tamsulosin (FLOMAX) 0.4 MG CAPS capsule Take 1 capsule (0.4 mg total) by mouth daily. Please pack and mail to home 90 capsule 3   traMADol (ULTRAM)  50 MG tablet Take 1 tablet (50 mg total) by mouth every 12 (twelve) hours as needed for severe pain. 20 tablet 0   No current facility-administered medications for this visit.    PHYSICAL EXAMINATION: ECOG PERFORMANCE STATUS: 1 - Symptomatic but completely ambulatory  Vitals:   05/24/21 1432  BP: (!) 173/72  Pulse: 74  Resp: 19  Temp: (!) 97.2 F (36.2 C)  SpO2: 98%   Filed Weights   05/24/21 1432  Weight: 138 lb (62.6 kg)    GENERAL:alert, no distress and comfortable SKIN: skin color, texture, turgor are normal, no rashes or significant lesions EYES: normal, Conjunctiva are pink and non-injected, sclera clear  NECK: supple, thyroid normal size, non-tender, without nodularity LYMPH:  no palpable lymphadenopathy in the  cervical, axillary or inguinal LUNGS: clear to auscultation and percussion with normal breathing effort HEART: regular rate & rhythm and no murmurs and no lower extremity edema ABDOMEN:abdomen soft, non-tender and normal bowel sounds Musculoskeletal:no cyanosis of digits and no clubbing  NEURO: alert & oriented x 3 with fluent speech, no focal motor/sensory deficits  LABORATORY DATA:  I have reviewed the data as listed CBC Latest Ref Rng & Units 05/24/2021 03/08/2021 03/01/2021  WBC 4.0 - 10.5 K/uL 4.0 3.5(L) 3.6(L)  Hemoglobin 13.0 - 17.0 g/dL 12.3(L) 12.5(L) 12.4(L)  Hematocrit 39.0 - 52.0 % 36.0(L) 38.2(L) 37.8(L)  Platelets 150 - 400 K/uL 138(L) 153 176     CMP Latest Ref Rng & Units 05/24/2021 03/08/2021 03/01/2021  Glucose 70 - 99 mg/dL 78 125(H) 109(H)  BUN 8 - 23 mg/dL 8 9 10   Creatinine 0.61 - 1.24 mg/dL 0.92 0.96 0.85  Sodium 135 - 145 mmol/L 138 134(L) 136  Potassium 3.5 - 5.1 mmol/L 4.1 4.4 4.2  Chloride 98 - 111 mmol/L 106 101 104  CO2 22 - 32 mmol/L 25 27 25   Calcium 8.9 - 10.3 mg/dL 9.3 8.5(L) 8.9  Total Protein 6.5 - 8.1 g/dL 9.0(H) 8.5(H) 8.8(H)  Total Bilirubin 0.3 - 1.2 mg/dL 0.9 0.7 0.8  Alkaline Phos 38 - 126 U/L 168(H) 137(H) 138(H)  AST 15 - 41 U/L 39 60(H) 62(H)  ALT 0 - 44 U/L 20 24 30       RADIOGRAPHIC STUDIES: I have personally reviewed the radiological images as listed and agreed with the findings in the report. No results found.   ASSESSMENT & PLAN:  Jim Little is a 75 y.o. male with   1. Hepatocellular Carcinoma, unresectable, stage IIIA  -Based on MRI liver 01/12/21 with Advanced cirrhotic changes involving the liver and Large infiltrating mass versus numerous confluent masses involving the right hepatic lobe. This is consistent with hepatocellular carcinoma. Associated thrombosed middle and portal veins. No definite left hepatic lobe lesions. AFP (01/14/21) 11,904.0. -His 01/15/21 CT of the chest did not show metastatic disease to the lungs. Staging  workup completed. -He has unclear etiology of cirrhosis, which is followed by Dr. Therisa Doyne. Hep B surface antigen non reactive (05/15/20), ANA negative, Hep C ab non reactive. No alcohol abuse. -He is s/p Y 90 radioembolization on 03/08/21.  -Given Y90 procedure for treatment, I'm holding systemic treatment (such as Tecentriq and Avastin) and will observe him after Y90.  I will offer systemic therapy if he has cancer progression after Y90  -He is scheduled for abdomen MRI on 05/29/21 with f/u with IR on 05/31/21. -F/u in 2 months   2. Scrotal swelling -developed following Y90 treatment -he is followed by [facility] for history of urinary retention  and brought this to Dr. Keane Scrape attention at his last visit on 04/30/21. He underwent scrotal ultrasound for further evaluation, but I do not have the report. -He had questions regarding this today, but I will defer care for this to Dr. Claudia Desanctis.   3. Social Support, Language barrier -From the [place], was at a refugee camp in [place] -Has several family members living in single house. 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 Neither him nor his family speak [place]  -He speaks Swahili. He has [facility] Nurse, [name] [name] who helps him. Her contact is [phone]   4. Portal vein thrombosis, Cirrhosis  -Seen with direct admission from 01/13/21-01/16/21 -Patient received heparin drip inpatient. Dr. Marin Olp did not recommend anticoagulation since the thrombus is tumor thrombus. -His liver cirrhosis is followed by Dr. Therisa Doyne from Gastroenterology. Prior work up in 2021 with EGD/Colonoscopy 07/2020     PLAN: -proceed with MRI abdomen 05/29/21 -f/u with IR on 05/31/21 -lab and f/u in 2 months, or sooner if his MRI shows cancer progression     No problem-specific Assessment & Plan notes found for this encounter.   No orders of the defined types were placed in this encounter.  All  questions were answered. The patient knows to call the clinic with any problems, questions or concerns. No barriers to learning was detected. The total time spent in the appointment was 30 minutes.     Truitt Merle, MD 05/24/2021   I, Wilburn Mylar, am acting as scribe for Truitt Merle, MD.   I have reviewed the above documentation for accuracy and completeness, and I agree with the above.

## 2021-05-24 NOTE — Telephone Encounter (Signed)
Scheduled per los. Gave avs and calendar  

## 2021-05-25 LAB — AFP TUMOR MARKER: AFP, Serum, Tumor Marker: 5817 ng/mL — ABNORMAL HIGH (ref 0.0–8.4)

## 2021-05-28 ENCOUNTER — Telehealth: Payer: Self-pay

## 2021-05-28 NOTE — Telephone Encounter (Signed)
Contacted patient with appointment reminder for MRI tomorrow. Transportation assistance  provided.  Honor Loh RN [facility] P[facility]  [facility] [phone]

## 2021-05-29 ENCOUNTER — Ambulatory Visit (HOSPITAL_COMMUNITY)
Admission: RE | Admit: 2021-05-29 | Discharge: 2021-05-29 | Disposition: A | Discharge status: Home / Self Care | Payer: Medicaid Other | Source: Ambulatory Visit | Attending: Diagnostic Radiology | Admitting: Diagnostic Radiology

## 2021-05-29 ENCOUNTER — Other Ambulatory Visit: Payer: Self-pay

## 2021-05-29 DIAGNOSIS — C220 Liver cell carcinoma: Secondary | ICD-10-CM | POA: Insufficient documentation

## 2021-05-29 MED ORDER — GADOBUTROL 1 MMOL/ML IV SOLN
6.0000 mL | Freq: Once | INTRAVENOUS | Status: AC | PRN
  Administered 2021-05-29: 6 mL via INTRAVENOUS

## 2021-05-29 NOTE — Congregational Nurse Program (Signed)
  Dept: [facility] Nurse Program Note  Date of Encounter: 05/29/2021  Past Medical History: Past Medical History:  Diagnosis Date   BPH (benign prostatic hyperplasia)    Cirrhosis ([place])    Hypertension    Splenomegaly     Encounter Details: Accompanied patient to [facility] for MRI. Assisted with transportation and interpretation.MRI completed and patient assisted with transportation back home.  Honor Loh RN [facility] P[facility]N  [facility] [phone]

## 2021-05-31 ENCOUNTER — Encounter: Payer: Self-pay | Admitting: *Deleted

## 2021-05-31 ENCOUNTER — Ambulatory Visit
Admission: RE | Admit: 2021-05-31 | Discharge: 2021-05-31 | Disposition: A | Discharge status: Home / Self Care | Payer: Medicaid Other | Source: Ambulatory Visit | Attending: Student | Admitting: Student

## 2021-05-31 DIAGNOSIS — C220 Liver cell carcinoma: Secondary | ICD-10-CM

## 2021-05-31 HISTORY — PX: IR RADIOLOGIST EVAL & MGMT: IMG5224

## 2021-05-31 NOTE — Progress Notes (Signed)
Chief Complaint: Patient was seen in consultation today for hepatocellular carcinoma follow-up   Referring Physician(s): Truitt Merle  History of Present Illness: Jim Little is a 75 y.o. 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  Patient underwent Y 90 radioembolization to the right hepatic lobe on 03/08/2021.  Patient is accompanied by a Swahili interpreter and patient's case manager was also on the phone at the same time.  At this time, the patient's main complaint is right scrotal pain.  Reportedly, the patient has a scrotal ultrasound ordered and he is scheduled to see urology.  He is not complaining of significant abdominal pain at this time.  No significant change in his energy level or appetite.  Overall, his appetite is poor.  Communication with the patient is difficult even with the interpreter.  Past Medical History:  Diagnosis Date   BPH (benign prostatic hyperplasia)    Cirrhosis ([place])    Hypertension    Splenomegaly     Past Surgical History:  Procedure Laterality Date   BIOPSY  08/22/2020   Procedure: BIOPSY;  Surgeon: Ronnette Juniper, MD;  Location: [facility];  Service: Gastroenterology;;   COLONOSCOPY WITH PROPOFOL N/A 08/22/2020   Procedure: COLONOSCOPY WITH PROPOFOL;  Surgeon: Ronnette Juniper, MD;  Location: [facility];  Service: Gastroenterology;  Laterality: N/A;   ESOPHAGOGASTRODUODENOSCOPY (EGD) WITH PROPOFOL N/A 08/22/2020   Procedure: ESOPHAGOGASTRODUODENOSCOPY (EGD) WITH PROPOFOL;  Surgeon: Ronnette Juniper, MD;  Location: [facility];  Service: Gastroenterology;  Laterality: N/A;   IR ANGIOGRAM SELECTIVE EACH ADDITIONAL VESSEL  03/01/2021   IR ANGIOGRAM SELECTIVE EACH ADDITIONAL VESSEL  03/08/2021   IR ANGIOGRAM VISCERAL SELECTIVE  03/01/2021   IR ANGIOGRAM VISCERAL SELECTIVE  03/01/2021   IR ANGIOGRAM VISCERAL SELECTIVE  03/08/2021   IR EMBO TUMOR ORGAN ISCHEMIA INFARCT INC GUIDE  ROADMAPPING  03/08/2021   IR RADIOLOGIST EVAL & MGMT  02/06/2021   IR RADIOLOGIST EVAL & MGMT  05/31/2021   IR US GUIDE VASC ACCESS RIGHT  03/01/2021   IR US GUIDE VASC ACCESS RIGHT  03/08/2021   UPPER GASTROINTESTINAL ENDOSCOPY  03/2018   [place] --candidiasis plus esophageal varices grade 2.  Treated with fluconazole and status post esophageal banding.   US ECHOCARDIOGRAPHY  10/2016   per overseas records from [place] - LVEF 68%; mildly calcified aortic valve, normal function otherwise    Allergies: Pork-derived products  Medications: Prior to Admission medications   Medication Sig Start Date End Date Taking? Authorizing Provider  acetaminophen (TYLENOL) 500 MG tablet Take 500 mg by mouth 2 (two) times daily as needed for mild pain.    [provider]  amLODipine (NORVASC) 5 MG tablet Take 1 tablet (5 mg total) by mouth daily. 01/31/21   [name], MD  azelastine (OPTIVAR) 0.05 % ophthalmic solution Place 1 drop into both eyes 2 (two) times daily. Patient not taking: No sig reported 01/31/20   [name], MD  benzocaine (ORAJEL) 10 % mucosal gel Use as directed in the mouth or throat 3 (three) times daily as needed for mouth pain. Patient not taking: Reported on 03/08/2021 01/16/21   Matilde Haymaker, MD  benzocaine (ORAJEL) 10 % mucosal gel USE AS DIRECTED IN THE MOUTH OR THROAT 3 (THREE) TIMES DAILY AS NEEDED FOR MOUTH PAIN. Patient not taking: Reported on 03/08/2021 01/16/21 01/16/22  Matilde Haymaker, MD  docusate sodium (COLACE) 100 MG capsule Take 1 capsule (100 mg total) by mouth  daily as needed. 01/16/21 01/16/22  Matilde Haymaker, MD  docusate sodium (COLACE) 100 MG capsule TAKE 1 CAPSULE (100 MG TOTAL) BY MOUTH DAILY AS NEEDED. 01/16/21 01/16/22  Matilde Haymaker, MD  fluticasone [facility]) 50 MCG/ACT nasal spray Place 2 sprays into both nostrils daily. Patient not taking: No sig reported 03/06/20   [name], MD  folic acid (FOLVITE) 1 MG tablet Take 1 tablet (1 mg total) by  mouth daily. Patient not taking: No sig reported 05/15/20   Milus Banister C, DO  hydroxypropyl methylcellulose / hypromellose (ISOPTO TEARS / GONIOVISC) 2.5 % ophthalmic solution Place 1 drop into both eyes 4 (four) times daily as needed for dry eyes. 01/16/21   Matilde Haymaker, MD  hydroxypropyl methylcellulose / hypromellose (ISOPTO TEARS / GONIOVISC) 2.5 % ophthalmic solution PLACE 1 DROP INTO BOTH EYES 4 (FOUR) TIMES DAILY AS NEEDED FOR DRY EYES. 01/16/21 01/16/22  Matilde Haymaker, MD  lactulose (CEPHULAC) 10 g packet Take 1 packet (10 g total) by mouth as needed (constipation). Dissolve in 8 ounces of water 11/27/20   [name], MD  Olopatadine HCl 0.2 % SOLN Apply 1 drop to eye daily. Patient not taking: No sig reported 03/06/20   [name], MD  omeprazole (PRILOSEC) 40 MG capsule Take 1 capsule (40 mg total) by mouth daily. 03/10/21   [name], MD  tamsulosin (FLOMAX) 0.4 MG CAPS capsule Take 1 capsule (0.4 mg total) by mouth daily. Please pack and mail to home 01/31/21   [name], MD  traMADol (ULTRAM) 50 MG tablet Take 1 tablet (50 mg total) by mouth every 12 (twelve) hours as needed for severe pain. 04/10/21   Autry-Lott, Naaman Plummer, DO     No family history on file.  Social History   Socioeconomic History   Marital status: Married    Spouse name: Not on file   Number of children: 2   Years of education: Not on file   Highest education level: Not on file  Occupational History   Not on file  Tobacco Use   Smoking status: Never   Smokeless tobacco: Never  Vaping Use   Vaping Use: Never used  Substance and Sexual Activity   Alcohol use: Never   Drug use: Not Currently   Sexual activity: Not on file  Other Topics Concern   Not on file  Social History Narrative   Not on file   Social Determinants of Health   Financial Resource Strain: Not on file  Food Insecurity: Not on file  Transportation Needs: Not on file  Physical Activity: Not on file  Stress: Not on  file  Social Connections: Not on file    Review of Systems  Constitutional:  Negative for activity change.       Poor appetite.  Respiratory: Negative.    Cardiovascular: Negative.   Gastrointestinal:  Negative for abdominal distention and abdominal pain.  Genitourinary:  Positive for scrotal swelling.   Vital Signs: BP (!) 189/71 (BP Location: Left Arm)   Pulse (!) 58   SpO2 98%   Physical Exam Vitals reviewed.  Cardiovascular:     Rate and Rhythm: Normal rate and regular rhythm.  Pulmonary:     Effort: Pulmonary effort is normal.     Breath sounds: Normal breath sounds.  Abdominal:     General: Abdomen is flat.     Palpations: Abdomen is soft.  Genitourinary:    Comments: Right scrotum is enlarged and hard.  No gross abnormality  to the left scrotum.  No evidence for a bulge or hematoma in the right groin at the previous catheter site. Neurological:     Mental Status: He is alert.      Imaging: MR ABDOMEN WWO CONTRAST  Result Date: 05/29/2021 CLINICAL DATA:  Right hepatocellular carcinoma. Status post Y 90 ablation. EXAM: MRI ABDOMEN WITHOUT AND WITH CONTRAST TECHNIQUE: Multiplanar multisequence MR imaging of the abdomen was performed both before and after the administration of intravenous contrast. CONTRAST:  71mL GADAVIST GADOBUTROL 1 MMOL/ML IV SOLN COMPARISON:  MRI 01/12/2021.  CTA 03/06/2021 FINDINGS: Lower chest: Unremarkable. Hepatobiliary: Cirrhotic liver morphology again noted. The large infiltrating mass in the right hepatic lobe is again demonstrated. Evidence of mild restricted diffusion noted on today's study. Ill-defined arterial phase hyperenhancement is evident measuring 5.4 x 5.2 cm today compared to 7.7 x 7.1 cm previously. Persistent early enhancement can be seen in the short-term after Y 90 embolization. Possible tiny focus of subcapsular arterial phase hyperenhancement in the dome of the liver near the junction of segment IV and VIII (axial arterial phase  postcontrast image 19 of series 18). This region was largely obscured by motion artifact on the previous MRI. Otherwise, no definite new foci of arterial phase hyperenhancement on today's study. There is no evidence for gallstones, gallbladder wall thickening, or pericholecystic fluid. Non enhancement of the right hepatic vein and right portal vein is compatible with thrombus seen on previous CTA. On today's exam, there is a filling defect in the region of the hepatic vein confluence at the IVC that extends in the IVC lumen cranially to the level of the IVC/RA junction (image 12/series 18). Subtraction imaging suggests that there may be some enhancement in this thrombus. Pancreas: No focal mass lesion. No dilatation of the main duct. No intraparenchymal cyst. No peripancreatic edema. Spleen:  No splenomegaly. No suspicious focal mass lesion. Adrenals/Urinary Tract: No adrenal nodule or mass. Kidneys unremarkable. Stomach/Bowel: Stomach is unremarkable. No gastric wall thickening. No evidence of outlet obstruction. Duodenum is normally positioned as is the ligament of Treitz. No small bowel or colonic dilatation within the visualized abdomen. Vascular/Lymphatic: No abdominal aortic aneurysm. Main portal vein, superior mesenteric vein, and splenic vein are patent. See above for discussion of IVC. 2.0 x 1.7 cm hepatoduodenal ligament lymph node (image 39/18) has increased from 1.8 x 1.2 cm on previous CT scan. Other: No substantial intraperitoneal free fluid. Bladder appears markedly distended. Musculoskeletal: No worrisome lytic or sclerotic osseous abnormality. Degenerative changes noted L4-5. IMPRESSION: 1. Interval decrease in size of the infiltrating mass in the right hepatic lobe showing heterogeneous arterial phase hyperenhancement. Persistent arterial phase enhancement can be seen early post y 4 embolization. No substantial restricted diffusion in the lesion on today's study. 2. Tiny focus of arterial phase  hyperenhancement in the dome of the liver near the junction of segment IV and VIII. Unclear whether this was present previously given the marked motion degradation on the earlier study. Close attention on follow-up recommended. 3. 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 Subtraction postcontrast imaging suggests that there may be some enhancement in the thrombus suggesting tumor thrombus or a combination of tumor and bland thrombus. 4. Interval increase in size of the hepatoduodenal ligament lymph node measuring 2.0 x 1.7 cm. Likely metastatic disease, continued close attention on follow-up recommended. 5. Markedly distended urinary  bladder. Electronically Signed   By: Misty Stanley M.D.   On: 05/29/2021 15:53   IR Radiologist Eval & Mgmt  Result Date: 05/31/2021 Please refer to notes tab for details about interventional procedure. (Op Note)   Labs:  CBC: Recent Labs    01/15/21 0320 03/01/21 0906 03/08/21 0829 05/24/21 1420  WBC 4.5 3.6* 3.5* 4.0  HGB 12.8* 12.4* 12.5* 12.3*  HCT 38.5* 37.8* 38.2* 36.0*  PLT 163 176 153 138*    COAGS: Recent Labs    01/15/21 0320 03/01/21 0906 03/08/21 0829 05/24/21 1420  INR 1.3* 1.2 1.3* 1.2  APTT  --  40*  --   --     BMP: Recent Labs    06/01/20 1222 12/04/20 1137 01/13/21 2146 01/16/21 0257 03/01/21 0906 03/08/21 0829 05/24/21 1420  NA 136 142   < > 137 136 134* 138  K 4.6 3.7   < > 3.9 4.2 4.4 4.1  CL 104 105   < > 103 104 101 106  CO2 26 24   < > 26 25 27 25   GLUCOSE 97 80   < > 114* 109* 125* 78  BUN 6* 6*   < > 6* 10 9 8   CALCIUM 8.6* 8.9   < > 8.9 8.9 8.5* 9.3  CREATININE 1.16 1.00   < > 0.94 0.85 0.96 0.92  GFRNONAA >60 72   < > >60 >60 >60 >60  GFRAA >60 84  --   --   --   --   --    < > = values in this interval not displayed.    LIVER FUNCTION TESTS: Recent Labs     01/15/21 0320 03/01/21 0906 03/08/21 0829 05/24/21 1420  BILITOT 1.1 0.8 0.7 0.9  AST 48* 62* 60* 39  ALT 20 30 24 20   ALKPHOS 108 138* 137* 168*  PROT 7.5 8.8* 8.5* 9.0*  ALBUMIN 3.1* 3.6 3.5 3.4*    TUMOR MARKERS: AFP 5817 (05/24/21)   34742 (01/14/21)  Assessment and Plan:  75 year old with an infiltrative hepatocellular carcinoma involving the right hepatic lobe.  There is vascular invasion into the portal venous system, hepatic veins and probably the IVC.  Patient is status post radioembolization with Y 90.  The large infiltrative mass has decreased in size based on the MRI and AFP has significantly decreased although it remains markedly elevated.  There is evidence for an enlarging hepatoduodenal lymph node compatible with metastatic disease.  At this point, I would defer to Dr. Burr Medico about systemic therapy.  With regards to liver directed therapy, we have very few options due to the vascular involvement.  Patient could undergo another Y90 treatment to the right hepatic lobe based on his liver function but I would probably wait until there is clear progression of disease within the liver itself.  Recommend follow-up MRI in 3 months.  Patient's main complaint is his right scrotum.  Right scrotum is clearly enlarged and hard.  This could be related to a large hydrocele but the patient needs a urologic evaluation.  According to the patient's case manager, he is scheduled to see urology in the near future.   Electronically Signed: Burman Riis 05/31/2021, 5:14 PM   I spent a total of 20 minutes in face to face in clinical consultation, greater than 50% of which was counseling/coordinating care for hepatocellular carcinoma.  Patient ID: Jim Little, male   DOB: 04-24-1946, 75 y.o.   MRN: [id number]

## 2021-06-04 ENCOUNTER — Telehealth: Payer: Self-pay | Admitting: Hematology

## 2021-06-04 NOTE — Telephone Encounter (Signed)
Scheduled appointment per 07/11 sch msg. Left message.

## 2021-06-11 ENCOUNTER — Ambulatory Visit: Payer: Medicaid Other | Admitting: Hematology

## 2021-06-11 DIAGNOSIS — C220 Liver cell carcinoma: Secondary | ICD-10-CM

## 2021-06-12 ENCOUNTER — Telehealth: Payer: Self-pay | Admitting: Hematology

## 2021-06-12 NOTE — Telephone Encounter (Signed)
R/s appt per 7/19 sch msg. I used the interpreter services and pt is aware of appt. I sent an email to transportation services to set up a ride for the pt.

## 2021-06-15 ENCOUNTER — Other Ambulatory Visit: Payer: Self-pay

## 2021-06-15 ENCOUNTER — Inpatient Hospital Stay: Payer: Medicaid Other | Attending: Hematology | Admitting: Hematology

## 2021-06-15 VITALS — BP 138/73 | HR 69 | Temp 98.8°F | Resp 18 | Wt 140.2 lb

## 2021-06-15 DIAGNOSIS — I81 Portal vein thrombosis: Secondary | ICD-10-CM | POA: Diagnosis not present

## 2021-06-15 DIAGNOSIS — C220 Liver cell carcinoma: Secondary | ICD-10-CM | POA: Diagnosis present

## 2021-06-15 DIAGNOSIS — N5089 Other specified disorders of the male genital organs: Secondary | ICD-10-CM | POA: Diagnosis not present

## 2021-06-15 DIAGNOSIS — K7460 Unspecified cirrhosis of liver: Secondary | ICD-10-CM | POA: Insufficient documentation

## 2021-06-15 NOTE — Progress Notes (Signed)
START OFF PATHWAY REGIMEN - Other   [id number]:Atezolizumab 1,200 mg IV D1 + Bevacizumab 15 mg/kg IV D1 q21 Days:   A cycle is every 21 Days:     Atezolizumab      Bevacizumab-xxxx   **Always confirm dose/schedule in your pharmacy ordering system**  Patient Characteristics: Intent of Therapy: Non-Curative / Palliative Intent, Discussed with Patient 

## 2021-06-15 NOTE — Progress Notes (Signed)
[place]   Telephone:([phone] Fax:([phone]) [phone]   Clinic Follow up Note   Patient Care Team: Jim Malay, MD as PCP - General (Family Medicine) Jim Arms, RN as [place] Management Jim Finner, RN (Inactive) as Oncology Nurse Navigator Jim Little, Jim Sos, PA-C as Physician Assistant (Physician Assistant) Jim Merle, MD as Consulting Physician (Oncology)  Date of Service:  06/15/2021  CHIEF COMPLAINT: f/u of [place]  SUMMARY OF ONCOLOGIC HISTORY: Oncology History Overview Note  Cancer Staging Hepatocellular carcinoma [facility]) Staging form: Liver, AJCC 8th Edition - Clinical stage from 02/09/2021: Stage IIIA (cT3, cN0, cM0) - Signed by Jim Merle, MD on 02/09/2021 Stage prefix: Initial diagnosis    Hepatocellular carcinoma ([place])  05/12/2020 Imaging   MRI Liver  IMPRESSION: 1. Geographic lesion in the inferior medial aspect of the RIGHT hepatic lobe has some imaging features suggesting focal fatty infiltration. However, atypical enhancement pattern. Recommend follow-up contrast MRI in 3 to 6 months to re-evaluate. 2. No additional lesion liver. 3. Morphologic changes consistent with cirrhosis.   01/12/2021 Imaging   MRI Abdomen  IMPRESSION: 1. Advanced cirrhotic changes involving the liver. There is a large infiltrating mass versus numerous confluent masses involving the right hepatic lobe and consistent with hepatocellular carcinoma. Associated thrombosed middle and portal veins. No definite left hepatic lobe lesions. LI-RADS 5. 2. Borderline splenomegaly. 3. No ascites or abdominal wall hernia.     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 The ultrasound correlate of the previously demonstrated right liver tumor is heterogeneous  tissue with ill-defined margin.   In addition to the right-sided tumor, there are several small hyperechoic foci of approximately 1 cm, potentially additional [place] foci versus regenerative nodules.   Cirrhosis and splenomegaly   01/15/2021 Imaging   CT Chest  IMPRESSION: 1. No evidence of metastatic disease in the chest. 2. Cirrhotic morphology of the liver with ill-defined, heterogeneous mass of the right lobe of the liver and occlusion of the right portal vein, in keeping with known hepatocellular carcinoma, better demonstrated by recent MR.   02/09/2021 Initial Diagnosis   Hepatocellular carcinoma ([place])    02/09/2021 Cancer Staging   Staging form: Liver, AJCC 8th Edition - Clinical stage from 02/09/2021: Stage IIIA (cT3, cN0, cM0) - Signed by Jim Merle, MD on 02/09/2021  Stage prefix: Initial diagnosis    05/29/2021 Imaging   MRI Abdomen  IMPRESSION: 1. Interval decrease in size of the infiltrating mass in the right hepatic lobe showing heterogeneous arterial phase hyperenhancement. Persistent arterial phase enhancement can be seen early post y 70 embolization. No substantial restricted diffusion in the lesion on today's study. 2. Tiny focus of arterial phase hyperenhancement in the dome of the liver near the junction of segment IV and VIII. Unclear whether this was present previously given the marked motion degradation on the earlier study. Close attention on follow-up recommended. 3. 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 Subtraction postcontrast imaging suggests that there may be some enhancement in the thrombus suggesting tumor thrombus or a combination of tumor and bland thrombus. 4. Interval increase in size of the hepatoduodenal ligament lymph node measuring 2.0 x 1.7 cm. Likely metastatic disease,  continued close attention on  follow-up recommended. 5. Markedly distended urinary bladder.      CURRENT THERAPY:  Surveillance, s/p Y90  INTERVAL HISTORY:  Jim Little is here for a follow up of [name]. He was last seen by me on 05/24/21. He presents to the clinic accompanied by our interpretor. 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   All other systems were reviewed with the patient and are negative.  MEDICAL HISTORY:  Past Medical History:  Diagnosis Date   BPH (benign prostatic hyperplasia)    Cirrhosis ([place])    Hypertension    Splenomegaly     SURGICAL HISTORY: Past Surgical History:  Procedure Laterality Date   BIOPSY  08/22/2020   Procedure: BIOPSY;  Surgeon: Jim Juniper, MD;  Location: [facility];  Service: Gastroenterology;;   COLONOSCOPY WITH PROPOFOL N/A 08/22/2020   Procedure: COLONOSCOPY WITH PROPOFOL;  Surgeon: Jim Juniper, MD;  Location: [facility];  Service: Gastroenterology;  Laterality: N/A;   ESOPHAGOGASTRODUODENOSCOPY (EGD) WITH PROPOFOL N/A 08/22/2020   Procedure: ESOPHAGOGASTRODUODENOSCOPY (EGD) WITH PROPOFOL;  Surgeon: Jim Juniper, MD;  Location: [facility];  Service: Gastroenterology;  Laterality: N/A;   IR ANGIOGRAM SELECTIVE EACH ADDITIONAL VESSEL  03/01/2021   IR ANGIOGRAM SELECTIVE EACH ADDITIONAL VESSEL  03/08/2021   IR ANGIOGRAM VISCERAL SELECTIVE  03/01/2021   IR ANGIOGRAM VISCERAL SELECTIVE  03/01/2021   IR ANGIOGRAM VISCERAL SELECTIVE  03/08/2021   IR EMBO TUMOR ORGAN ISCHEMIA INFARCT INC GUIDE ROADMAPPING  03/08/2021   IR RADIOLOGIST EVAL & MGMT  02/06/2021   IR RADIOLOGIST EVAL & MGMT  05/31/2021   IR US GUIDE VASC ACCESS RIGHT  03/01/2021   IR US GUIDE VASC ACCESS RIGHT  03/08/2021   UPPER GASTROINTESTINAL ENDOSCOPY  03/2018   [place] --candidiasis plus esophageal varices grade 2.  Treated with fluconazole and status post esophageal banding.   US ECHOCARDIOGRAPHY  10/2016   per overseas  records from [place] - LVEF 68%; mildly calcified aortic valve, normal function otherwise    I have reviewed the social history and family history with the patient and they are unchanged from previous note.  ALLERGIES:  is allergic to pork-derived products.  MEDICATIONS:  Current Outpatient Medications  Medication Sig Dispense Refill   acetaminophen (TYLENOL) 500 MG tablet Take 500 mg by mouth 2 (two) times daily as needed for mild pain.     amLODipine (NORVASC) 5 MG tablet Take 1 tablet (5 mg total) by mouth daily. 90 tablet 3   azelastine (OPTIVAR) 0.05 % ophthalmic solution Place 1 drop into both eyes 2 (two) times daily. (Patient not taking: No sig reported) 6 mL 12   benzocaine (ORAJEL) 10 % mucosal gel Use as directed in the mouth or throat 3 (three) times daily as needed for mouth pain. (Patient not taking: Reported on 03/08/2021) 7.1 g 0   benzocaine (ORAJEL) 10 % mucosal gel USE AS DIRECTED IN THE MOUTH OR THROAT 3 (THREE) TIMES DAILY AS NEEDED FOR MOUTH PAIN. (Patient not taking: Reported on 03/08/2021) 7 g 0   docusate sodium (COLACE) 100 MG capsule Take 1 capsule (100 mg total) by mouth daily as needed. 60 capsule 0   docusate sodium (COLACE) 100 MG capsule TAKE 1 CAPSULE (100 MG TOTAL) BY MOUTH DAILY AS NEEDED. 60 capsule 0   fluticasone (FLONASE) 50 MCG/ACT nasal spray Place 2 sprays into both nostrils daily. (Patient not taking: No sig reported) 16 g  6   folic acid (FOLVITE) 1 MG tablet Take 1 tablet (1 mg total) by mouth daily. (Patient not taking: No sig reported) 90 tablet 3   hydroxypropyl methylcellulose / hypromellose (ISOPTO TEARS / GONIOVISC) 2.5 % ophthalmic solution Place 1 drop into both eyes 4 (four) times daily as needed for dry eyes. 15 mL 0   hydroxypropyl methylcellulose / hypromellose (ISOPTO TEARS / GONIOVISC) 2.5 % ophthalmic solution PLACE 1 DROP INTO BOTH EYES 4 (FOUR) TIMES DAILY AS NEEDED FOR DRY EYES. 15 mL 0   lactulose (CEPHULAC) 10 g packet Take 1 packet  (10 g total) by mouth as needed (constipation). Dissolve in 8 ounces of water 30 each 3   Olopatadine HCl 0.2 % SOLN Apply 1 drop to eye daily. (Patient not taking: No sig reported) 2.5 mL 3   omeprazole (PRILOSEC) 40 MG capsule Take 1 capsule (40 mg total) by mouth daily. 90 capsule 3   tamsulosin (FLOMAX) 0.4 MG CAPS capsule Take 1 capsule (0.4 mg total) by mouth daily. Please pack and mail to home 90 capsule 3   traMADol (ULTRAM) 50 MG tablet Take 1 tablet (50 mg total) by mouth every 12 (twelve) hours as needed for severe pain. 20 tablet 0   No current facility-administered medications for this visit.    PHYSICAL EXAMINATION: ECOG PERFORMANCE STATUS: 1 - Symptomatic but completely ambulatory  Vitals:   06/15/21 1552  BP: 138/73  Pulse: 69  Resp: 18  Temp: 98.8 F (37.1 C)  SpO2: 100%    Filed Weights   06/15/21 1552  Weight: 140 lb 3.2 oz (63.6 kg)     GENERAL:alert, no distress and comfortable SKIN: skin color, texture, turgor are normal, no rashes or significant lesions EYES: normal, Conjunctiva are pink and non-injected, sclera clear  Abdomen: soft, nontender, no ascites  Musculoskeletal:no cyanosis of digits and no clubbing  NEURO: alert & oriented x 3 with fluent speech, no focal motor/sensory deficits  LABORATORY DATA:  I have reviewed the data as listed CBC Latest Ref Rng & Units 05/24/2021 03/08/2021 03/01/2021  WBC 4.0 - 10.5 K/uL 4.0 3.5(L) 3.6(L)  Hemoglobin 13.0 - 17.0 g/dL 12.3(L) 12.5(L) 12.4(L)  Hematocrit 39.0 - 52.0 % 36.0(L) 38.2(L) 37.8(L)  Platelets 150 - 400 K/uL 138(L) 153 176     CMP Latest Ref Rng & Units 05/24/2021 03/08/2021 03/01/2021  Glucose 70 - 99 mg/dL 78 125(H) 109(H)  BUN 8 - 23 mg/dL '8 9 10  '$ Creatinine 0.61 - 1.24 mg/dL 0.92 0.96 0.85  Sodium 135 - 145 mmol/L 138 134(L) 136  Potassium 3.5 - 5.1 mmol/L 4.1 4.4 4.2  Chloride 98 - 111 mmol/L 106 101 104  CO2 22 - 32 mmol/L '25 27 25  '$ Calcium 8.9 - 10.3 mg/dL 9.3 8.5(L) 8.9  Total  Protein 6.5 - 8.1 g/dL 9.0(H) 8.5(H) 8.8(H)  Total Bilirubin 0.3 - 1.2 mg/dL 0.9 0.7 0.8  Alkaline Phos 38 - 126 U/L 168(H) 137(H) 138(H)  AST 15 - 41 U/L 39 60(H) 62(H)  ALT 0 - 44 U/L '20 24 30      '$ RADIOGRAPHIC STUDIES: I have personally reviewed the radiological images as listed and agreed with the findings in the report. No results found.   ASSESSMENT & PLAN:  Jim Little is a 75 y.o. male with   1. Hepatocellular Carcinoma, unresectable, stage IIIA, with node metastasis  -Based on MRI liver 01/12/21 with Advanced cirrhotic changes involving the liver and Large infiltrating mass versus numerous confluent masses involving the  right hepatic lobe. This is consistent with hepatocellular carcinoma. Associated thrombosed middle and portal veins. No definite left hepatic lobe lesions. AFP (01/14/21) 11,904.0. -His 01/15/21 CT of the chest did not show metastatic disease to the lungs. Staging workup completed. -He has unclear etiology of cirrhosis, which is followed by Dr. Therisa Doyne. Hep B surface antigen non reactive (05/15/20), ANA negative, Hep C ab non reactive. No alcohol abuse. -He is s/p Y 90 radioembolization on 03/08/21. His AFP has dropped significantly after Y90 -I reviewed his recent MRI scan from 05/29/2021 which showed good response in liver, but he developed hepatoduodenal ligament lymph node measuring 2.0 x 1.7 cm, likely metastatic disease   -given his extrahepatic metastasis, I recommend systemic treatment Tecentriq and Avastin. Pt has not been compliant with oral medicine, oral TKI will be challenge for him. --Chemotherapy consent: Side effects including but does not not limited to, fatigue, skin rash, pneumonitis, colitis, thyroid dysfunction, other endocrine disorders, hypertension, proteinuria, small risk of cardiovascular disease, hemorrhage, thrombosis, bowel perforation, etc., were discussed with patient in great detail. He agrees to proceed. -He had a EGD in September 2021, showed  no significant varices, okay to proceed Avastin -We will see him back with first treatment in a few weeks  2. Scrotal swelling -developed following Y90 treatment -he is followed by [facility] for history of urinary retention and brought this to Dr. Keane Scrape attention at his last visit on 04/30/21. He underwent scrotal ultrasound for further evaluation, but I do not have the report. -f/u with Dr. Claudia Desanctis.   3. Social Support, Language barrier -From the [place], was at a refugee camp in [place] -Has several family members living in single house. He is the primary caretaker for his 14 year old granddaughter with cerebral palsy. Neither him nor his family speak [place]  -He speaks Swahili. He has [employer] [employer] [facility], [name] [name] who helps him. Her contact is [phone]   4. Portal vein thrombosis, Cirrhosis  -Seen with direct admission from 01/13/21-01/16/21 -Patient received heparin drip inpatient. Dr. Marin Olp did not recommend anticoagulation since the thrombus is tumor thrombus. -His liver cirrhosis is followed by Dr. Therisa Doyne from Gastroenterology. Prior work up in 2021 with EGD/Colonoscopy 07/2020 which showed no varices      PLAN: -lab and recent MRI reviewed  -will start Atezolizumab and Beva in 1-2 weeks with lab and f/u      No problem-specific Assessment & Plan notes found for this encounter.   No orders of the defined types were placed in this encounter.  All questions were answered. The patient knows to call the clinic with any problems, questions or concerns. No barriers to learning was detected. The total time spent in the appointment was 40 minutes.     Jim Merle, MD 06/15/2021

## 2021-06-18 ENCOUNTER — Telehealth: Payer: Self-pay

## 2021-06-18 ENCOUNTER — Telehealth: Payer: Self-pay | Admitting: Hematology

## 2021-06-18 NOTE — Telephone Encounter (Signed)
Scheduled follow-up appointments per 7/22 los. Patient is aware.

## 2021-06-22 ENCOUNTER — Telehealth: Payer: Self-pay | Admitting: Hematology

## 2021-06-22 ENCOUNTER — Other Ambulatory Visit: Payer: Medicaid Other

## 2021-06-22 NOTE — Progress Notes (Signed)
Pharmacist Chemotherapy Monitoring - Initial Assessment    Anticipated start date: 06/29/21   The following has been reviewed per standard work regarding the patient's treatment regimen: The patient's diagnosis, treatment plan and drug doses, and organ/hematologic function Lab orders and baseline tests specific to treatment regimen  The treatment plan start date, drug sequencing, and pre-medications Prior authorization status  Patient's documented medication list, including drug-drug interaction screen and prescriptions for anti-emetics and supportive care specific to the treatment regimen The drug concentrations, fluid compatibility, administration routes, and timing of the medications to be used The patient's access for treatment and lifetime cumulative dose history, if applicable  The patient's medication allergies and previous infusion related reactions, if applicable   Changes made to treatment plan:  N/A  Follow up needed:  N/A   Larene Beach, [place], 06/22/2021  2:43 PM

## 2021-06-22 NOTE — Telephone Encounter (Signed)
R/s appt per 7/29 sch msg. Pt aware.

## 2021-06-27 ENCOUNTER — Inpatient Hospital Stay: Payer: Medicaid Other | Attending: Hematology

## 2021-06-27 ENCOUNTER — Encounter: Payer: Self-pay | Admitting: Family Medicine

## 2021-06-27 ENCOUNTER — Other Ambulatory Visit: Payer: Self-pay

## 2021-06-27 DIAGNOSIS — Z5112 Encounter for antineoplastic immunotherapy: Secondary | ICD-10-CM | POA: Insufficient documentation

## 2021-06-27 DIAGNOSIS — I10 Essential (primary) hypertension: Secondary | ICD-10-CM | POA: Insufficient documentation

## 2021-06-27 DIAGNOSIS — Z79899 Other long term (current) drug therapy: Secondary | ICD-10-CM | POA: Insufficient documentation

## 2021-06-27 DIAGNOSIS — N400 Enlarged prostate without lower urinary tract symptoms: Secondary | ICD-10-CM | POA: Insufficient documentation

## 2021-06-27 DIAGNOSIS — C220 Liver cell carcinoma: Secondary | ICD-10-CM | POA: Insufficient documentation

## 2021-06-27 NOTE — Progress Notes (Signed)
Transportation services called today and reviewed upcoming appointments this week. Transportation arranged for Friday treatments with pick up at patient 's home at [facility] for [place] appointment. Patient informed via interpreter during Patient Education Session. Verbalized understanding.

## 2021-06-29 ENCOUNTER — Other Ambulatory Visit: Payer: Self-pay

## 2021-06-29 ENCOUNTER — Inpatient Hospital Stay: Payer: Medicaid Other

## 2021-06-29 ENCOUNTER — Encounter: Payer: Self-pay | Admitting: Hematology

## 2021-06-29 ENCOUNTER — Inpatient Hospital Stay (HOSPITAL_BASED_OUTPATIENT_CLINIC_OR_DEPARTMENT_OTHER): Payer: Medicaid Other | Admitting: Hematology

## 2021-06-29 VITALS — BP 149/69 | HR 61 | Temp 98.3°F | Resp 18 | Wt 136.0 lb

## 2021-06-29 DIAGNOSIS — N400 Enlarged prostate without lower urinary tract symptoms: Secondary | ICD-10-CM | POA: Diagnosis not present

## 2021-06-29 DIAGNOSIS — C220 Liver cell carcinoma: Secondary | ICD-10-CM

## 2021-06-29 DIAGNOSIS — I10 Essential (primary) hypertension: Secondary | ICD-10-CM | POA: Diagnosis not present

## 2021-06-29 DIAGNOSIS — Z79899 Other long term (current) drug therapy: Secondary | ICD-10-CM | POA: Diagnosis not present

## 2021-06-29 DIAGNOSIS — Z5112 Encounter for antineoplastic immunotherapy: Secondary | ICD-10-CM | POA: Diagnosis present

## 2021-06-29 LAB — CBC WITH DIFFERENTIAL (CANCER CENTER ONLY)
Abs Immature Granulocytes: 0.01 10*3/uL (ref 0.00–0.07)
Basophils Absolute: 0 10*3/uL (ref 0.0–0.1)
Basophils Relative: 1 %
Eosinophils Absolute: 0.2 10*3/uL (ref 0.0–0.5)
Eosinophils Relative: 6 %
HCT: 36.7 % — ABNORMAL LOW (ref 39.0–52.0)
Hemoglobin: 12.5 g/dL — ABNORMAL LOW (ref 13.0–17.0)
Immature Granulocytes: 0 %
Lymphocytes Relative: 26 %
Lymphs Abs: 1.1 10*3/uL (ref 0.7–4.0)
MCH: 34 pg (ref 26.0–34.0)
MCHC: 34.1 g/dL (ref 30.0–36.0)
MCV: 99.7 fL (ref 80.0–100.0)
Monocytes Absolute: 0.6 10*3/uL (ref 0.1–1.0)
Monocytes Relative: 14 %
Neutro Abs: 2.3 10*3/uL (ref 1.7–7.7)
Neutrophils Relative %: 53 %
Platelet Count: 156 10*3/uL (ref 150–400)
RBC: 3.68 MIL/uL — ABNORMAL LOW (ref 4.22–5.81)
RDW: 13.9 % (ref 11.5–15.5)
WBC Count: 4.3 10*3/uL (ref 4.0–10.5)
nRBC: 0 % (ref 0.0–0.2)

## 2021-06-29 LAB — TOTAL PROTEIN, URINE DIPSTICK: Protein, ur: 30 mg/dL — AB

## 2021-06-29 LAB — CMP (CANCER CENTER ONLY)
ALT: 34 U/L (ref 0–44)
AST: 60 U/L — ABNORMAL HIGH (ref 15–41)
Albumin: 3.5 g/dL (ref 3.5–5.0)
Alkaline Phosphatase: 202 U/L — ABNORMAL HIGH (ref 38–126)
Anion gap: 7 (ref 5–15)
BUN: 10 mg/dL (ref 8–23)
CO2: 28 mmol/L (ref 22–32)
Calcium: 9.5 mg/dL (ref 8.9–10.3)
Chloride: 104 mmol/L (ref 98–111)
Creatinine: 0.99 mg/dL (ref 0.61–1.24)
GFR, Estimated: >60 mL/min (ref >60)
Glucose, Bld: 89 mg/dL (ref 70–99)
Potassium: 4.6 mmol/L (ref 3.5–5.1)
Sodium: 139 mmol/L (ref 135–145)
Total Bilirubin: 0.5 mg/dL (ref 0.3–1.2)
Total Protein: 9.3 g/dL — ABNORMAL HIGH (ref 6.5–8.1)

## 2021-06-29 LAB — PROTIME-INR
INR: 1.1 (ref 0.8–1.2)
Prothrombin Time: 14.5 seconds (ref 11.4–15.2)

## 2021-06-29 LAB — TSH: TSH: 1.552 u[IU]/mL (ref 0.320–4.118)

## 2021-06-29 MED ORDER — SODIUM CHLORIDE 0.9 % IV SOLN
Freq: Once | INTRAVENOUS | Status: AC
Start: 2021-06-29 — End: 2021-06-29
  Filled 2021-06-29: qty 250

## 2021-06-29 MED ORDER — SODIUM CHLORIDE 0.9 % IV SOLN: 15.0000 mg/kg | Freq: Once | INTRAVENOUS | Status: DC

## 2021-06-29 MED ORDER — SODIUM CHLORIDE 0.9 % IV SOLN
15.0000 mg/kg | Freq: Once | INTRAVENOUS | Status: AC
  Administered 2021-06-29: 900 mg via INTRAVENOUS
  Filled 2021-06-29: qty 4

## 2021-06-29 MED ORDER — SODIUM CHLORIDE 0.9 % IV SOLN
1200.0000 mg | Freq: Once | INTRAVENOUS | Status: AC
  Administered 2021-06-29: 1200 mg via INTRAVENOUS
  Filled 2021-06-29: qty 20

## 2021-06-29 NOTE — Progress Notes (Addendum)
[facility]   Telephone:([phone] Fax:(336) [phone]   Clinic Follow up Note   Patient Care Team: [name], MD as PCP - General (Family Medicine) [name], RN as [place] Management Jonnie Finner, RN (Inactive) as Oncology Nurse Navigator Heilingoetter, Tobe Sos, PA-C as Physician Assistant (Physician Assistant) Truitt Merle, MD as Consulting Physician (Oncology)  Date of Service:  06/29/2021  CHIEF COMPLAINT: f/u of [place]  SUMMARY OF ONCOLOGIC HISTORY: Oncology History Overview Note  Cancer Staging Hepatocellular carcinoma [facility]) Staging form: Liver, AJCC 8th Edition - Clinical stage from 02/09/2021: Stage IIIA (cT3, cN0, cM0) - Signed by Truitt Merle, MD on 02/09/2021 Stage prefix: Initial diagnosis    Hepatocellular carcinoma ([place])  05/12/2020 Imaging   MRI Liver  IMPRESSION: 1. Geographic lesion in the inferior medial aspect of the RIGHT hepatic lobe has some imaging features suggesting focal fatty infiltration. However, atypical enhancement pattern. Recommend follow-up contrast MRI in 3 to 6 months to re-evaluate. 2. No additional lesion liver. 3. Morphologic changes consistent with cirrhosis.   01/12/2021 Imaging   MRI Abdomen  IMPRESSION: 1. Advanced cirrhotic changes involving the liver. There is a large infiltrating mass versus numerous confluent masses involving the right hepatic lobe and consistent with hepatocellular carcinoma. Associated thrombosed middle and portal veins. No definite left hepatic lobe lesions. LI-RADS 5. 2. Borderline splenomegaly. 3. No ascites or abdominal wall hernia.     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 The ultrasound correlate of the previously demonstrated right liver tumor is heterogeneous  tissue with ill-defined margin.   In addition to the right-sided tumor, there are several small hyperechoic foci of approximately 1 cm, potentially additional [place] foci versus regenerative nodules.   Cirrhosis and splenomegaly   01/15/2021 Imaging   CT Chest  IMPRESSION: 1. No evidence of metastatic disease in the chest. 2. Cirrhotic morphology of the liver with ill-defined, heterogeneous mass of the right lobe of the liver and occlusion of the right portal vein, in keeping with known hepatocellular carcinoma, better demonstrated by recent MR.   02/09/2021 Initial Diagnosis   Hepatocellular carcinoma ([place])    02/09/2021 Cancer Staging   Staging form: Liver, AJCC 8th Edition - Clinical stage from 02/09/2021: Stage IIIA (cT3, cN0, cM0) - Signed by Truitt Merle, MD on 02/09/2021  Stage prefix: Initial diagnosis    05/29/2021 Imaging   MRI Abdomen  IMPRESSION: 1. Interval decrease in size of the infiltrating mass in the right hepatic lobe showing heterogeneous arterial phase hyperenhancement. Persistent arterial phase enhancement can be seen early post y 26 embolization. No substantial restricted diffusion in the lesion on today's study. 2. Tiny focus of arterial phase hyperenhancement in the dome of the liver near the junction of segment IV and VIII. Unclear whether this was present previously given the marked motion degradation on the earlier study. Close attention on follow-up recommended. 3. 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 Subtraction postcontrast imaging suggests that there may be some enhancement in the thrombus suggesting tumor thrombus or a combination of tumor and bland thrombus. 4. Interval increase in size of the hepatoduodenal ligament lymph node measuring 2.0 x 1.7 cm. Likely metastatic disease,  continued close attention on  follow-up recommended. 5. Markedly distended urinary bladder.   06/29/2021 -  Chemotherapy    Patient is on Treatment Plan: LUNG ATEZOLIZUMAB + BEVACIZUMAB Q21D MAINTENANCE          CURRENT THERAPY:  Tecentriq and Avastin, q3weeks, starting 06/29/21  INTERVAL HISTORY:  Jim Little is here for a follow up of [place]. He was last seen by me on 06/15/21. He was seen in the infusion area. He has an interpreter present and his daughter-in-law on the phone. His nurse [name] is on vacation.   All other systems were reviewed with the patient and are negative.  MEDICAL HISTORY:  Past Medical History:  Diagnosis Date   BPH (benign prostatic hyperplasia)    Cirrhosis ([place])    Hypertension    Splenomegaly     SURGICAL HISTORY: Past Surgical History:  Procedure Laterality Date   BIOPSY  08/22/2020   Procedure: BIOPSY;  Surgeon: Ronnette Juniper, MD;  Location: [facility];  Service: Gastroenterology;;   COLONOSCOPY WITH PROPOFOL N/A 08/22/2020   Procedure: COLONOSCOPY WITH PROPOFOL;  Surgeon: Ronnette Juniper, MD;  Location: [facility];  Service: Gastroenterology;  Laterality: N/A;   ESOPHAGOGASTRODUODENOSCOPY (EGD) WITH PROPOFOL N/A 08/22/2020   Procedure: ESOPHAGOGASTRODUODENOSCOPY (EGD) WITH PROPOFOL;  Surgeon: Ronnette Juniper, MD;  Location: [facility];  Service: Gastroenterology;  Laterality: N/A;   IR ANGIOGRAM SELECTIVE EACH ADDITIONAL VESSEL  03/01/2021   IR ANGIOGRAM SELECTIVE EACH ADDITIONAL VESSEL  03/08/2021   IR ANGIOGRAM VISCERAL SELECTIVE  03/01/2021   IR ANGIOGRAM VISCERAL SELECTIVE  03/01/2021   IR ANGIOGRAM VISCERAL SELECTIVE  03/08/2021   IR EMBO TUMOR ORGAN ISCHEMIA INFARCT INC GUIDE ROADMAPPING  03/08/2021   IR RADIOLOGIST EVAL & MGMT  02/06/2021   IR RADIOLOGIST EVAL & MGMT  05/31/2021   IR US GUIDE VASC ACCESS RIGHT  03/01/2021   IR US GUIDE VASC ACCESS RIGHT  03/08/2021   UPPER GASTROINTESTINAL ENDOSCOPY  03/2018   [place] --candidiasis plus esophageal varices grade 2.  Treated  with fluconazole and status post esophageal banding.   US ECHOCARDIOGRAPHY  10/2016   per overseas records from [place] - LVEF 68%; mildly calcified aortic valve, normal function otherwise    I have reviewed the social history and family history with the patient and they are unchanged from previous note.  ALLERGIES:  is allergic to pork-derived products.  MEDICATIONS:  Current Outpatient Medications  Medication Sig Dispense Refill   acetaminophen (TYLENOL) 500 MG tablet Take 500 mg by mouth 2 (two) times daily as needed for mild pain.     amLODipine (NORVASC) 5 MG tablet Take 1 tablet (5 mg total) by mouth daily. 90 tablet 3   azelastine (OPTIVAR) 0.05 % ophthalmic solution Place 1 drop into both eyes 2 (two) times daily. (Patient not taking: No sig reported) 6 mL 12   benzocaine (ORAJEL) 10 % mucosal gel Use as directed in the mouth or throat 3 (three) times daily as needed for mouth pain. (Patient not taking: Reported on 03/08/2021) 7.1 g 0   benzocaine (ORAJEL) 10 % mucosal gel USE AS DIRECTED IN THE MOUTH OR THROAT 3 (THREE) TIMES DAILY AS NEEDED FOR MOUTH PAIN. (Patient not taking: Reported on 03/08/2021) 7 g 0   docusate sodium (COLACE) 100 MG capsule Take 1 capsule (100 mg total) by mouth daily as needed. 60 capsule 0   docusate sodium (COLACE) 100 MG capsule TAKE 1 CAPSULE (100 MG TOTAL) BY MOUTH DAILY AS NEEDED. 60 capsule 0  fluticasone (FLONASE) 50 MCG/ACT nasal spray Place 2 sprays into both nostrils daily. (Patient not taking: No sig reported) 16 g 6   folic acid (FOLVITE) 1 MG tablet Take 1 tablet (1 mg total) by mouth daily. (Patient not taking: No sig reported) 90 tablet 3   hydroxypropyl methylcellulose / hypromellose (ISOPTO TEARS / GONIOVISC) 2.5 % ophthalmic solution Place 1 drop into both eyes 4 (four) times daily as needed for dry eyes. 15 mL 0   hydroxypropyl methylcellulose / hypromellose (ISOPTO TEARS / GONIOVISC) 2.5 % ophthalmic solution PLACE 1 DROP INTO BOTH EYES 4  (FOUR) TIMES DAILY AS NEEDED FOR DRY EYES. 15 mL 0   lactulose (CEPHULAC) 10 g packet Take 1 packet (10 g total) by mouth as needed (constipation). Dissolve in 8 ounces of water 30 each 3   Olopatadine HCl 0.2 % SOLN Apply 1 drop to eye daily. (Patient not taking: No sig reported) 2.5 mL 3   omeprazole (PRILOSEC) 40 MG capsule Take 1 capsule (40 mg total) by mouth daily. 90 capsule 3   tamsulosin (FLOMAX) 0.4 MG CAPS capsule Take 1 capsule (0.4 mg total) by mouth daily. Please pack and mail to home 90 capsule 3   traMADol (ULTRAM) 50 MG tablet Take 1 tablet (50 mg total) by mouth every 12 (twelve) hours as needed for severe pain. 20 tablet 0   No current facility-administered medications for this visit.    PHYSICAL EXAMINATION: ECOG PERFORMANCE STATUS: 1 - Symptomatic but completely ambulatory  There were no vitals filed for this visit. Wt Readings from Last 3 Encounters:  06/29/21 136 lb (61.7 kg)  06/15/21 140 lb 3.2 oz (63.6 kg)  05/24/21 138 lb (62.6 kg)    GENERAL:alert, no distress and comfortable SKIN: skin color, texture, turgor are normal, no rashes or significant lesions EYES: normal, Conjunctiva are pink and non-injected, sclera clear  LUNGS: clear to auscultation and percussion with normal breathing effort HEART: regular rate & rhythm and no murmurs and no lower extremity edema ABDOMEN:abdomen soft, non-tender and normal bowel sounds Musculoskeletal:no cyanosis of digits and no clubbing  NEURO: alert & oriented x 3 with fluent speech, no focal motor/sensory deficits  LABORATORY DATA:  I have reviewed the data as listed CBC Latest Ref Rng & Units 06/29/2021 05/24/2021 03/08/2021  WBC 4.0 - 10.5 K/uL 4.3 4.0 3.5(L)  Hemoglobin 13.0 - 17.0 g/dL 12.5(L) 12.3(L) 12.5(L)  Hematocrit 39.0 - 52.0 % 36.7(L) 36.0(L) 38.2(L)  Platelets 150 - 400 K/uL 156 138(L) 153     CMP Latest Ref Rng & Units 06/29/2021 05/24/2021 03/08/2021  Glucose 70 - 99 mg/dL 89 78 125(H)  BUN 8 - 23 mg/dL '10  8 9  '$ Creatinine 0.61 - 1.24 mg/dL 0.99 0.92 0.96  Sodium 135 - 145 mmol/L 139 138 134(L)  Potassium 3.5 - 5.1 mmol/L 4.6 4.1 4.4  Chloride 98 - 111 mmol/L 104 106 101  CO2 22 - 32 mmol/L '28 25 27  '$ Calcium 8.9 - 10.3 mg/dL 9.5 9.3 8.5(L)  Total Protein 6.5 - 8.1 g/dL 9.3(H) 9.0(H) 8.5(H)  Total Bilirubin 0.3 - 1.2 mg/dL 0.5 0.9 0.7  Alkaline Phos 38 - 126 U/L 202(H) 168(H) 137(H)  AST 15 - 41 U/L 60(H) 39 60(H)  ALT 0 - 44 U/L 34 20 24      RADIOGRAPHIC STUDIES: I have personally reviewed the radiological images as listed and agreed with the findings in the report. No results found.   ASSESSMENT & PLAN:  Jim Little is a  75 y.o. male with   1. Hepatocellular Carcinoma, unresectable, stage IIIA, with node metastasis  -Based on MRI liver 01/12/21 with Advanced cirrhotic changes involving the liver and Large infiltrating mass versus numerous confluent masses involving the right hepatic lobe. This is consistent with hepatocellular carcinoma. Associated thrombosed middle and portal veins. No definite left hepatic lobe lesions. AFP (01/14/21) 11,904.0. -His 01/15/21 CT of the chest did not show metastatic disease to the lungs. Staging workup completed. -He has unclear etiology of cirrhosis, which is followed by Dr. Therisa Doyne. Hep B surface antigen non reactive (05/15/20), ANA negative, Hep C ab non reactive. No alcohol abuse. -He is s/p Y 90 radioembolization on 03/08/21. His AFP has dropped significantly after Y90 -MRI scan from 05/29/2021 showed good response in liver, but he developed hepatoduodenal ligament lymph node measuring 2.0 x 1.7 cm, likely metastatic disease   -given his extrahepatic metastasis, I recommend systemic treatment Tecentriq and Avastin. Pt has not been compliant with oral medicine, oral TKI will be challenge for him.  -He had a EGD in September 2021, showed no significant varices, okay to proceed Avastin -He is here for first cycle treatment. We reviewed the indications and side  effects again today. He agrees to proceed.    2. Scrotal swelling -developed following Y90 treatment -he is followed by [facility] for history of urinary retention and brought this to Dr. Keane Scrape attention at his last visit on 04/30/21. He underwent scrotal ultrasound for further evaluation, but I do not have the report. -f/u with Dr. Claudia Desanctis.   3. Social Support, Language barrier -From the [place], was at a refugee camp in [place] -Has several family members living in single house. 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 Neither him nor his family speak [place]  -He speaks Swahili. He has [facility] [employer] [facility], [name] [name] who helps him. Her contact is [phone]   4. Portal vein thrombosis, Cirrhosis  -Seen with direct admission from 01/13/21-01/16/21 -Patient received heparin drip inpatient. Dr. Marin Olp did not recommend anticoagulation since the thrombus is tumor thrombus. -His liver cirrhosis is followed by Dr. Therisa Doyne from Gastroenterology. Prior work up in 2021 with EGD/Colonoscopy 07/2020 which showed no varices      PLAN: -proceed with C1 Tecentriq and Avastin today -labs, f/u, and C2 in 3 weeks   No problem-specific Assessment & Plan notes found for this encounter.   No orders of the defined types were placed in this encounter.  All questions were answered. The patient knows to call the clinic with any problems, questions or concerns. No barriers to learning was detected. The total time spent in the appointment was 30 minutes.     Truitt Merle, MD 06/29/2021  I, Wilburn Mylar, am acting as scribe for Truitt Merle, MD.   I have reviewed the above documentation for accuracy and completeness, and I agree with the above.

## 2021-06-29 NOTE — Patient Instructions (Signed)
[facility]  Discharge Instructions: Thank you for choosing [facility] to provide your oncology and hematology care.   If you have a lab appointment with the [facility], please go directly to the [facility] and check in at the registration area.   Wear comfortable clothing and clothing appropriate for easy access to any Portacath or PICC line.   We strive to give you quality time with your provider. You may need to reschedule your appointment if you arrive late (15 or more minutes).  Arriving late affects you and other patients whose appointments are after yours.  Also, if you miss three or more appointments without notifying the office, you may be dismissed from the clinic at the provider's discretion.      For prescription refill requests, have your pharmacy contact our office and allow 72 hours for refills to be completed.    Today you received the following chemotherapy and/or immunotherapy agents: Tecentriq/Avastin      To help prevent nausea and vomiting after your treatment, we encourage you to take your nausea medication as directed.  BELOW ARE SYMPTOMS THAT SHOULD BE REPORTED IMMEDIATELY: *FEVER GREATER THAN 100.4 F (38 C) OR HIGHER *CHILLS OR SWEATING *NAUSEA AND VOMITING THAT IS NOT CONTROLLED WITH YOUR NAUSEA MEDICATION *UNUSUAL SHORTNESS OF BREATH *UNUSUAL BRUISING OR BLEEDING *URINARY PROBLEMS (pain or burning when urinating, or frequent urination) *BOWEL PROBLEMS (unusual diarrhea, constipation, pain near the anus) TENDERNESS IN MOUTH AND THROAT WITH OR WITHOUT PRESENCE OF ULCERS (sore throat, sores in mouth, or a toothache) UNUSUAL RASH, SWELLING OR PAIN  UNUSUAL VAGINAL DISCHARGE OR ITCHING   Items with * indicate a potential emergency and should be followed up as soon as possible or go to the Emergency Department if any problems should occur.  Please show the CHEMOTHERAPY ALERT CARD or IMMUNOTHERAPY ALERT CARD at  check-in to the Emergency Department and triage nurse.  Should you have questions after your visit or need to cancel or reschedule your appointment, please contact [facility]  Dept: [phone]  and follow the prompts.  Office hours are 8:00 a.m. to 4:30 p.m. Monday - Friday. Please note that voicemails left after 4:00 p.m. may not be returned until the following business day.  We are closed weekends and major holidays. You have access to a nurse at all times for urgent questions. Please call the main number to the clinic Dept: [phone] and follow the prompts.   For any non-urgent questions, you may also contact your provider using MyChart. We now offer e-Visits for anyone 78 and older to request care online for non-urgent symptoms. For details visit [email or web address].   Also download the MyChart app! Go to the app store, search "MyChart", open the app, select [facility], and log in with your MyChart username and password.  Due to Covid, a mask is required upon entering the hospital/clinic. If you do not have a mask, one will be given to you upon arrival. For doctor visits, patients may have 1 support person aged 50 or older with them. For treatment visits, patients cannot have anyone with them due to current Covid guidelines and our immunocompromised population.   Atezolizumab injection What is this medication? ATEZOLIZUMAB (a te zoe LIZ ue mab) is a monoclonal antibody. It is used to treat bladder cancer (urothelial cancer), liver cancer, lung cancer, andmelanoma. This medicine may be used for other purposes; ask your health care provider orpharmacist if you have questions.  COMMON BRAND NAME(S): Tecentriq What should I tell my care team before I take this medication? They need to know if you have any of these conditions: autoimmune diseases like Crohn's disease, ulcerative colitis, or lupus have had or planning to have an allogeneic stem cell transplant  (uses someone else's stem cells) history of organ transplant history of radiation to the chest nervous system problems like myasthenia gravis or Guillain-Barre syndrome an unusual or allergic reaction to atezolizumab, other medicines, foods, dyes, or preservatives pregnant or trying to get pregnant breast-feeding How should I use this medication? This medicine is for infusion into a vein. It is given by a health careprofessional in a hospital or clinic setting. A special MedGuide will be given to you before each treatment. Be sure to readthis information carefully each time. Talk to your pediatrician regarding the use of this medicine in children.Special care may be needed. Overdosage: If you think you have taken too much of this medicine contact apoison control center or emergency room at once. NOTE: This medicine is only for you. Do not share this medicine with others. What if I miss a dose? It is important not to miss your dose. Call your doctor or health careprofessional if you are unable to keep an appointment. What may interact with this medication? Interactions have not been studied. This list may not describe all possible interactions. Give your health care provider a list of all the medicines, herbs, non-prescription drugs, or dietary supplements you use. Also tell them if you smoke, drink alcohol, or use illegaldrugs. Some items may interact with your medicine. What should I watch for while using this medication? Your condition will be monitored carefully while you are receiving thismedicine. You may need blood work done while you are taking this medicine. Do not become pregnant while taking this medicine or for at least 5 months after stopping it. Women should inform their doctor if they wish to become pregnant or think they might be pregnant. There is a potential for serious side effects to an unborn child. Talk to your health care professional or pharmacist for more information. Do  not breast-feed an infant while taking this medicineor for at least 5 months after the last dose. What side effects may I notice from receiving this medication? Side effects that you should report to your doctor or health care professionalas soon as possible: allergic reactions like skin rash, itching or hives, swelling of the face, lips, or tongue black, tarry stools bloody or watery diarrhea breathing problems changes in vision chest pain or chest tightness chills facial flushing fever headache signs and symptoms of high blood sugar such as dizziness; dry mouth; dry skin; fruity breath; nausea; stomach pain; increased hunger or thirst; increased urination signs and symptoms of liver injury like dark yellow or brown urine; general ill feeling or flu-like symptoms; light-colored stools; loss of appetite; nausea; right upper belly pain; unusually weak or tired; yellowing of the eyes or skin stomach pain trouble passing urine or change in the amount of urine Side effects that usually do not require medical attention (report to yourdoctor or health care professional if they continue or are bothersome): bone pain cough diarrhea joint pain muscle pain muscle weakness swelling of arms or legs tiredness weight loss This list may not describe all possible side effects. Call your doctor for medical advice about side effects. You may report side effects to FDA at1-800-FDA-1088. Where should I keep my medication? This drug is given in a hospital or clinic  and will not be stored at home. NOTE: This sheet is a summary. It may not cover all possible information. If you have questions about this medicine, talk to your doctor, pharmacist, orhealth care provider.  2022 Elsevier/Gold Standard (2020-08-10 13:59:34) Bevacizumab injection What is this medication? BEVACIZUMAB (be va SIZ yoo mab) is a monoclonal antibody. It is used to treatmany types of cancer. This medicine may be used for other  purposes; ask your health care provider orpharmacist if you have questions. COMMON BRAND NAME(S): Avastin, MVASI, Noah Charon What should I tell my care team before I take this medication? They need to know if you have any of these conditions: diabetes heart disease high blood pressure history of coughing up blood prior anthracycline chemotherapy (e.g., doxorubicin, daunorubicin, epirubicin) recent or ongoing radiation therapy recent or planning to have surgery stroke an unusual or allergic reaction to bevacizumab, hamster proteins, mouse proteins, other medicines, foods, dyes, or preservatives pregnant or trying to get pregnant breast-feeding How should I use this medication? This medicine is for infusion into a vein. It is given by a health careprofessional in a hospital or clinic setting. Talk to your pediatrician regarding the use of this medicine in children.Special care may be needed. Overdosage: If you think you have taken too much of this medicine contact apoison control center or emergency room at once. NOTE: This medicine is only for you. Do not share this medicine with others. What if I miss a dose? It is important not to miss your dose. Call your doctor or health careprofessional if you are unable to keep an appointment. What may interact with this medication? Interactions are not expected. This list may not describe all possible interactions. Give your health care provider a list of all the medicines, herbs, non-prescription drugs, or dietary supplements you use. Also tell them if you smoke, drink alcohol, or use illegaldrugs. Some items may interact with your medicine. What should I watch for while using this medication? Your condition will be monitored carefully while you are receiving this medicine. You will need important blood work and urine testing done while youare taking this medicine. This medicine may increase your risk to bruise or bleed. Call your doctor orhealth care  professional if you notice any unusual bleeding. Before having surgery, talk to your health care provider to make sure it is ok. This drug can increase the risk of poor healing of your surgical site or wound. You will need to stop this drug for 28 days before surgery. After surgery, wait at least 28 days before restarting this drug. Make sure the surgical site or wound is healed enough before restarting this drug. Talk to your health careprovider if questions. Do not become pregnant while taking this medicine or for 6 months after stopping it. Women should inform their doctor if they wish to become pregnant or think they might be pregnant. There is a potential for serious side effects to an unborn child. Talk to your health care professional or pharmacist for more information. Do not breast-feed an infant while taking this medicine andfor 6 months after the last dose. This medicine has caused ovarian failure in some women. This medicine may interfere with the ability to have a child. You should talk to your doctor orhealth care professional if you are concerned about your fertility. What side effects may I notice from receiving this medication? Side effects that you should report to your doctor or health care professionalas soon as possible: allergic reactions like skin rash, itching  or hives, swelling of the face, lips, or tongue chest pain or chest tightness chills coughing up blood high fever seizures severe constipation signs and symptoms of bleeding such as bloody or black, tarry stools; red or dark-brown urine; spitting up blood or brown material that looks like coffee grounds; red spots on the skin; unusual bruising or bleeding from the eye, gums, or nose signs and symptoms of a blood clot such as breathing problems; chest pain; severe, sudden headache; pain, swelling, warmth in the leg signs and symptoms of a stroke like changes in vision; confusion; trouble speaking or understanding; severe  headaches; sudden numbness or weakness of the face, arm or leg; trouble walking; dizziness; loss of balance or coordination stomach pain sweating swelling of legs or ankles vomiting weight gain Side effects that usually do not require medical attention (report to yourdoctor or health care professional if they continue or are bothersome): back pain changes in taste decreased appetite dry skin nausea tiredness This list may not describe all possible side effects. Call your doctor for medical advice about side effects. You may report side effects to FDA at1-800-FDA-1088. Where should I keep my medication? This drug is given in a hospital or clinic and will not be stored at home. NOTE: This sheet is a summary. It may not cover all possible information. If you have questions about this medicine, talk to your doctor, pharmacist, orhealth care provider.  2022 Elsevier/Gold Standard (2019-09-08 10:50:46)

## 2021-06-29 NOTE — Progress Notes (Signed)
Met w/ pt to introduce myself as his Arboriculturist.  Pt's insurance should pay for his treatment at 100% so copay assistance isn't needed.  I informed him by way of his interpreter of the [employer], went over what it covers and gave him an expense sheet.  Pt would like to apply so he will bring his proof of income on 07/20/21.  He has my card for any questions or concerns he may have in the future.

## 2021-06-30 ENCOUNTER — Encounter: Payer: Self-pay | Admitting: Hematology

## 2021-07-13 ENCOUNTER — Encounter: Payer: Self-pay | Admitting: Hematology

## 2021-07-13 NOTE — Telephone Encounter (Signed)
Accessed in Error.  No changes Made

## 2021-07-17 ENCOUNTER — Other Ambulatory Visit: Payer: Self-pay

## 2021-07-17 NOTE — Congregational Nurse Program (Signed)
  Dept: [facility] Nurse Program Note  Date of Encounter: 07/17/2021  Past Medical History: Past Medical History:  Diagnosis Date   BPH (benign prostatic hyperplasia)    Cirrhosis ([place])    Hypertension    Splenomegaly     Encounter Details:  Home visit completed on 07/16/21 at 1700hrs.  Patient reports to be doing well with no new concerns.Medication inspected. Patient currently taking Flomax, omeprazole and norvasc only.Noticed Norvasc package is unopened. Patient stated that he had misplaced it. Educated patient on compliance.  I will keep up with his appointments and schedule rides as needed.  Honor Loh RN [facility]  [facility] [id number]

## 2021-07-19 ENCOUNTER — Telehealth: Payer: Self-pay

## 2021-07-19 NOTE — Telephone Encounter (Signed)
Appointment scheduled with urology for October 7th 2022 at 1:45pm. Patient called and made aware.I will scheduled transportation as well.  Honor Loh RN [facility]  [facility] [id number]

## 2021-07-19 NOTE — Telephone Encounter (Signed)
Transportation scheduled for tomorrow`s appointment at [facility]. Pick up time 10:15am [employer] called and made aware.  Honor Loh RN [facility]  [facility] [id number]

## 2021-07-20 ENCOUNTER — Other Ambulatory Visit: Payer: Self-pay

## 2021-07-20 ENCOUNTER — Other Ambulatory Visit (HOSPITAL_COMMUNITY): Payer: Self-pay

## 2021-07-20 ENCOUNTER — Telehealth: Payer: Self-pay

## 2021-07-20 ENCOUNTER — Inpatient Hospital Stay: Payer: Medicaid Other

## 2021-07-20 ENCOUNTER — Encounter: Payer: Self-pay | Admitting: Hematology

## 2021-07-20 ENCOUNTER — Inpatient Hospital Stay (HOSPITAL_BASED_OUTPATIENT_CLINIC_OR_DEPARTMENT_OTHER): Payer: Medicaid Other | Admitting: Hematology

## 2021-07-20 VITALS — BP 174/85 | HR 84 | Temp 98.5°F | Resp 19 | Ht 66.0 in | Wt 140.0 lb

## 2021-07-20 VITALS — BP 175/87

## 2021-07-20 DIAGNOSIS — C220 Liver cell carcinoma: Secondary | ICD-10-CM

## 2021-07-20 DIAGNOSIS — K5900 Constipation, unspecified: Secondary | ICD-10-CM

## 2021-07-20 DIAGNOSIS — Z5112 Encounter for antineoplastic immunotherapy: Secondary | ICD-10-CM | POA: Diagnosis not present

## 2021-07-20 LAB — PROTIME-INR
INR: 1.2 (ref 0.8–1.2)
Prothrombin Time: 14.7 seconds (ref 11.4–15.2)

## 2021-07-20 LAB — CBC WITH DIFFERENTIAL (CANCER CENTER ONLY)
Abs Immature Granulocytes: 0.02 10*3/uL (ref 0.00–0.07)
Basophils Absolute: 0 10*3/uL (ref 0.0–0.1)
Basophils Relative: 0 %
Eosinophils Absolute: 0.2 10*3/uL (ref 0.0–0.5)
Eosinophils Relative: 3 %
HCT: 39.3 % (ref 39.0–52.0)
Hemoglobin: 13.3 g/dL (ref 13.0–17.0)
Immature Granulocytes: 0 %
Lymphocytes Relative: 20 %
Lymphs Abs: 1.1 10*3/uL (ref 0.7–4.0)
MCH: 34.1 pg — ABNORMAL HIGH (ref 26.0–34.0)
MCHC: 33.8 g/dL (ref 30.0–36.0)
MCV: 100.8 fL — ABNORMAL HIGH (ref 80.0–100.0)
Monocytes Absolute: 0.8 10*3/uL (ref 0.1–1.0)
Monocytes Relative: 14 %
Neutro Abs: 3.6 10*3/uL (ref 1.7–7.7)
Neutrophils Relative %: 63 %
Platelet Count: 144 10*3/uL — ABNORMAL LOW (ref 150–400)
RBC: 3.9 MIL/uL — ABNORMAL LOW (ref 4.22–5.81)
RDW: 14.4 % (ref 11.5–15.5)
WBC Count: 5.7 10*3/uL (ref 4.0–10.5)
nRBC: 0 % (ref 0.0–0.2)

## 2021-07-20 LAB — CMP (CANCER CENTER ONLY)
ALT: 33 U/L (ref 0–44)
AST: 46 U/L — ABNORMAL HIGH (ref 15–41)
Albumin: 3.5 g/dL (ref 3.5–5.0)
Alkaline Phosphatase: 196 U/L — ABNORMAL HIGH (ref 38–126)
Anion gap: 7 (ref 5–15)
BUN: 11 mg/dL (ref 8–23)
CO2: 26 mmol/L (ref 22–32)
Calcium: 9.4 mg/dL (ref 8.9–10.3)
Chloride: 105 mmol/L (ref 98–111)
Creatinine: 1.03 mg/dL (ref 0.61–1.24)
GFR, Estimated: >60 mL/min (ref >60)
Glucose, Bld: 82 mg/dL (ref 70–99)
Potassium: 4.4 mmol/L (ref 3.5–5.1)
Sodium: 138 mmol/L (ref 135–145)
Total Bilirubin: 0.7 mg/dL (ref 0.3–1.2)
Total Protein: 9.3 g/dL — ABNORMAL HIGH (ref 6.5–8.1)

## 2021-07-20 MED ORDER — LACTULOSE 10 GM/15ML PO SOLN
20.0000 g | Freq: Once | ORAL | Status: AC | PRN
  Administered 2021-07-20: 20 g via ORAL
  Filled 2021-07-20: qty 30

## 2021-07-20 MED ORDER — LACTULOSE 10 G PO PACK: 10.0000 g | PACK | ORAL | 1 refills | Status: DC | PRN

## 2021-07-20 MED ORDER — SODIUM CHLORIDE 0.9 % IV SOLN
15.0000 mg/kg | Freq: Once | INTRAVENOUS | Status: AC
  Administered 2021-07-20: 1000 mg via INTRAVENOUS
  Filled 2021-07-20: qty 32

## 2021-07-20 MED ORDER — SODIUM CHLORIDE 0.9 % IV SOLN: Freq: Once | INTRAVENOUS | Status: AC

## 2021-07-20 MED ORDER — LACTULOSE 10 GM/15ML PO SOLN
20.0000 g | ORAL | Status: DC
  Filled 2021-07-20: qty 30

## 2021-07-20 MED ORDER — SODIUM CHLORIDE 0.9 % IV SOLN
1200.0000 mg | Freq: Once | INTRAVENOUS | Status: AC
  Administered 2021-07-20: 1200 mg via INTRAVENOUS
  Filled 2021-07-20: qty 20

## 2021-07-20 NOTE — Telephone Encounter (Signed)
Transportation assistance provided by [employer].Patient confirmed arrival to his residence.  Honor Loh RN [facility]  [facility] [id number]

## 2021-07-20 NOTE — Patient Instructions (Signed)
[facility]  Discharge Instructions: Thank you for choosing [facility] to provide your oncology and hematology care.   If you have a lab appointment with the [facility], please go directly to the [facility] and check in at the registration area.   Wear comfortable clothing and clothing appropriate for easy access to any Portacath or PICC line.   We strive to give you quality time with your provider. You may need to reschedule your appointment if you arrive late (15 or more minutes).  Arriving late affects you and other patients whose appointments are after yours.  Also, if you miss three or more appointments without notifying the office, you may be dismissed from the clinic at the provider's discretion.      For prescription refill requests, have your pharmacy contact our office and allow 72 hours for refills to be completed.    Today you received the following chemotherapy and/or immunotherapy agents Tecentriq & Noah Charon      To help prevent nausea and vomiting after your treatment, we encourage you to take your nausea medication as directed.  BELOW ARE SYMPTOMS THAT SHOULD BE REPORTED IMMEDIATELY: *FEVER GREATER THAN 100.4 F (38 C) OR HIGHER *CHILLS OR SWEATING *NAUSEA AND VOMITING THAT IS NOT CONTROLLED WITH YOUR NAUSEA MEDICATION *UNUSUAL SHORTNESS OF BREATH *UNUSUAL BRUISING OR BLEEDING *URINARY PROBLEMS (pain or burning when urinating, or frequent urination) *BOWEL PROBLEMS (unusual diarrhea, constipation, pain near the anus) TENDERNESS IN MOUTH AND THROAT WITH OR WITHOUT PRESENCE OF ULCERS (sore throat, sores in mouth, or a toothache) UNUSUAL RASH, SWELLING OR PAIN  UNUSUAL VAGINAL DISCHARGE OR ITCHING   Items with * indicate a potential emergency and should be followed up as soon as possible or go to the Emergency Department if any problems should occur.  Please show the CHEMOTHERAPY ALERT CARD or IMMUNOTHERAPY ALERT CARD at  check-in to the Emergency Department and triage nurse.  Should you have questions after your visit or need to cancel or reschedule your appointment, please contact [facility]  Dept: [phone]  and follow the prompts.  Office hours are 8:00 a.m. to 4:30 p.m. Monday - Friday. Please note that voicemails left after 4:00 p.m. may not be returned until the following business day.  We are closed weekends and major holidays. You have access to a nurse at all times for urgent questions. Please call the main number to the clinic Dept: [phone] and follow the prompts.   For any non-urgent questions, you may also contact your provider using MyChart. We now offer e-Visits for anyone 32 and older to request care online for non-urgent symptoms. For details visit [email or web address].   Also download the MyChart app! Go to the app store, search "MyChart", open the app, select [facility], and log in with your MyChart username and password.  Due to Covid, a mask is required upon entering the hospital/clinic. If you do not have a mask, one will be given to you upon arrival. For doctor visits, patients may have 1 support person aged 36 or older with them. For treatment visits, patients cannot have anyone with them due to current Covid guidelines and our immunocompromised population. Atezolizumab injection What is this medication? ATEZOLIZUMAB (a te zoe LIZ ue mab) is a monoclonal antibody. It is used to treat bladder cancer (urothelial cancer), liver cancer, lung cancer, andmelanoma. This medicine may be used for other purposes; ask your health care provider orpharmacist if you have questions.  COMMON BRAND NAME(S): Tecentriq What should I tell my care team before I take this medication? They need to know if you have any of these conditions: autoimmune diseases like Crohn's disease, ulcerative colitis, or lupus have had or planning to have an allogeneic stem cell transplant  (uses someone else's stem cells) history of organ transplant history of radiation to the chest nervous system problems like myasthenia gravis or Guillain-Barre syndrome an unusual or allergic reaction to atezolizumab, other medicines, foods, dyes, or preservatives pregnant or trying to get pregnant breast-feeding How should I use this medication? This medicine is for infusion into a vein. It is given by a health careprofessional in a hospital or clinic setting. A special MedGuide will be given to you before each treatment. Be sure to readthis information carefully each time. Talk to your pediatrician regarding the use of this medicine in children.Special care may be needed. Overdosage: If you think you have taken too much of this medicine contact apoison control center or emergency room at once. NOTE: This medicine is only for you. Do not share this medicine with others. What if I miss a dose? It is important not to miss your dose. Call your doctor or health careprofessional if you are unable to keep an appointment. What may interact with this medication? Interactions have not been studied. This list may not describe all possible interactions. Give your health care provider a list of all the medicines, herbs, non-prescription drugs, or dietary supplements you use. Also tell them if you smoke, drink alcohol, or use illegaldrugs. Some items may interact with your medicine. What should I watch for while using this medication? Your condition will be monitored carefully while you are receiving thismedicine. You may need blood work done while you are taking this medicine. Do not become pregnant while taking this medicine or for at least 5 months after stopping it. Women should inform their doctor if they wish to become pregnant or think they might be pregnant. There is a potential for serious side effects to an unborn child. Talk to your health care professional or pharmacist for more information. Do  not breast-feed an infant while taking this medicineor for at least 5 months after the last dose. What side effects may I notice from receiving this medication? Side effects that you should report to your doctor or health care professionalas soon as possible: allergic reactions like skin rash, itching or hives, swelling of the face, lips, or tongue black, tarry stools bloody or watery diarrhea breathing problems changes in vision chest pain or chest tightness chills facial flushing fever headache signs and symptoms of high blood sugar such as dizziness; dry mouth; dry skin; fruity breath; nausea; stomach pain; increased hunger or thirst; increased urination signs and symptoms of liver injury like dark yellow or brown urine; general ill feeling or flu-like symptoms; light-colored stools; loss of appetite; nausea; right upper belly pain; unusually weak or tired; yellowing of the eyes or skin stomach pain trouble passing urine or change in the amount of urine Side effects that usually do not require medical attention (report to yourdoctor or health care professional if they continue or are bothersome): bone pain cough diarrhea joint pain muscle pain muscle weakness swelling of arms or legs tiredness weight loss This list may not describe all possible side effects. Call your doctor for medical advice about side effects. You may report side effects to FDA at1-800-FDA-1088. Where should I keep my medication? This drug is given in a hospital or clinic  and will not be stored at home. NOTE: This sheet is a summary. It may not cover all possible information. If you have questions about this medicine, talk to your doctor, pharmacist, orhealth care provider.  2022 Elsevier/Gold Standard (2020-08-10 13:59:34)  Bevacizumab injection What is this medication? BEVACIZUMAB (be va SIZ yoo mab) is a monoclonal antibody. It is used to treatmany types of cancer. This medicine may be used for other  purposes; ask your health care provider orpharmacist if you have questions. COMMON BRAND NAME(S): Avastin, MVASI, Noah Charon What should I tell my care team before I take this medication? They need to know if you have any of these conditions: diabetes heart disease high blood pressure history of coughing up blood prior anthracycline chemotherapy (e.g., doxorubicin, daunorubicin, epirubicin) recent or ongoing radiation therapy recent or planning to have surgery stroke an unusual or allergic reaction to bevacizumab, hamster proteins, mouse proteins, other medicines, foods, dyes, or preservatives pregnant or trying to get pregnant breast-feeding How should I use this medication? This medicine is for infusion into a vein. It is given by a health careprofessional in a hospital or clinic setting. Talk to your pediatrician regarding the use of this medicine in children.Special care may be needed. Overdosage: If you think you have taken too much of this medicine contact apoison control center or emergency room at once. NOTE: This medicine is only for you. Do not share this medicine with others. What if I miss a dose? It is important not to miss your dose. Call your doctor or health careprofessional if you are unable to keep an appointment. What may interact with this medication? Interactions are not expected. This list may not describe all possible interactions. Give your health care provider a list of all the medicines, herbs, non-prescription drugs, or dietary supplements you use. Also tell them if you smoke, drink alcohol, or use illegaldrugs. Some items may interact with your medicine. What should I watch for while using this medication? Your condition will be monitored carefully while you are receiving this medicine. You will need important blood work and urine testing done while youare taking this medicine. This medicine may increase your risk to bruise or bleed. Call your doctor orhealth care  professional if you notice any unusual bleeding. Before having surgery, talk to your health care provider to make sure it is ok. This drug can increase the risk of poor healing of your surgical site or wound. You will need to stop this drug for 28 days before surgery. After surgery, wait at least 28 days before restarting this drug. Make sure the surgical site or wound is healed enough before restarting this drug. Talk to your health careprovider if questions. Do not become pregnant while taking this medicine or for 6 months after stopping it. Women should inform their doctor if they wish to become pregnant or think they might be pregnant. There is a potential for serious side effects to an unborn child. Talk to your health care professional or pharmacist for more information. Do not breast-feed an infant while taking this medicine andfor 6 months after the last dose. This medicine has caused ovarian failure in some women. This medicine may interfere with the ability to have a child. You should talk to your doctor orhealth care professional if you are concerned about your fertility. What side effects may I notice from receiving this medication? Side effects that you should report to your doctor or health care professionalas soon as possible: allergic reactions like skin rash,  itching or hives, swelling of the face, lips, or tongue chest pain or chest tightness chills coughing up blood high fever seizures severe constipation signs and symptoms of bleeding such as bloody or black, tarry stools; red or dark-brown urine; spitting up blood or brown material that looks like coffee grounds; red spots on the skin; unusual bruising or bleeding from the eye, gums, or nose signs and symptoms of a blood clot such as breathing problems; chest pain; severe, sudden headache; pain, swelling, warmth in the leg signs and symptoms of a stroke like changes in vision; confusion; trouble speaking or understanding; severe  headaches; sudden numbness or weakness of the face, arm or leg; trouble walking; dizziness; loss of balance or coordination stomach pain sweating swelling of legs or ankles vomiting weight gain Side effects that usually do not require medical attention (report to yourdoctor or health care professional if they continue or are bothersome): back pain changes in taste decreased appetite dry skin nausea tiredness This list may not describe all possible side effects. Call your doctor for medical advice about side effects. You may report side effects to FDA at1-800-FDA-1088. Where should I keep my medication? This drug is given in a hospital or clinic and will not be stored at home. NOTE: This sheet is a summary. It may not cover all possible information. If you have questions about this medicine, talk to your doctor, pharmacist, orhealth care provider.  2022 Elsevier/Gold Standard (2019-09-08 10:50:46)

## 2021-07-20 NOTE — Telephone Encounter (Signed)
Patient is currently been seen by Dr Burr Medico. Has complained of constipation lasting more than a week.He has no lactulose at home. I will call [facility] and request a refill to be shipped to his residence. He has 2 dulcolax at home and will provide him more (free samples) to use meanwhile.  I have advised him to let me know when he is in need of refills or any other issues eg constipation.  Honor Loh RN [facility]  [facility] [phone]

## 2021-07-20 NOTE — Progress Notes (Signed)
Per Dr. Burr Medico ok to give bevacizumab with BP 175/87.

## 2021-07-20 NOTE — Progress Notes (Signed)
[place]   Telephone:([phone] Fax:([phone]) [phone]   Clinic Follow up Note   Patient Care Team: [name], MD as PCP - General (Family Medicine) Jonnie Finner, RN (Inactive) as Oncology Nurse Navigator Heilingoetter, Tobe Sos, PA-C as Physician Assistant (Physician Assistant) Truitt Merle, MD as Consulting Physician (Oncology)  Date of Service:  07/20/2021  CHIEF COMPLAINT: f/u of Jim Little  ASSESSMENT & PLAN:  Jim Little is a 75 y.o. male with   1. Hepatocellular Carcinoma, unresectable, stage IIIA, with node metastasis  -Based on MRI liver 01/12/21 with Advanced cirrhotic changes involving the liver and Large infiltrating mass versus numerous confluent masses involving the right hepatic lobe. This is consistent with hepatocellular carcinoma. Associated thrombosed middle and portal veins. No definite left hepatic lobe lesions. AFP (01/14/21) 11,904.0. -His 01/15/21 CT of the chest did not show metastatic disease to the lungs. Staging workup completed. -He has unclear etiology of cirrhosis, which is followed by Dr. Therisa Doyne. Hep B surface antigen non reactive (05/15/20), ANA negative, Hep C ab non reactive. No alcohol abuse. -He is s/p Y 90 radioembolization on 03/08/21. His AFP has dropped significantly after Y90 -MRI scan from 05/29/2021 showed good response in liver, but he developed hepatoduodenal ligament lymph node measuring 2.0 x 1.7 cm, likely metastatic disease   -given his extrahepatic metastasis, I recommend systemic treatment Tecentriq and Avastin. Pt has not been compliant with oral medicine, oral TKI will be challenge for him.  -He had a EGD in 07/2020, showed no significant varices, okay to proceed Avastin -He started Tecentriq and Avastin on 06/29/21. He tolerated the first cycle well with constipation. He states he is okay to proceed with cycle 2 today.  2. Constipation -he reports constipation for the last week or two. He notes associated abdominal pain  that started last night. -he notes he has two remaining dulcolax remaining but has not taken them. -he has previously used lactulose. I will refill this, and I advised his congregational nurse, [name], to get him Miralax.  -I will ask our pharmacy to give him a laxative if they have any available.   3. Scrotal swelling -developed following Y90 treatment -he is followed by [facility] for history of urinary retention and brought this to Dr. Keane Scrape attention at his last visit on 04/30/21. He underwent scrotal ultrasound for further evaluation, but I do not have the report. [name] notes he missed an appointment with urology while she was on vacation. He is scheduled for appointment on 08/31/21.   4. Social Support, Language barrier -From the [place], was at a refugee camp in [place] -Has several family members living in single house. 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 Neither him nor his family speak [place]  -He speaks Swahili. He has [facility], [name] [name] who helps him. Her contact is [phone]   5. Portal vein thrombosis, Cirrhosis  -Seen with direct admission from 01/13/21-01/16/21 -Patient received heparin drip inpatient. Dr. Marin Olp did not recommend anticoagulation since the thrombus is tumor thrombus. -His liver cirrhosis is followed by Dr. Therisa Doyne from Gastroenterology. Prior work up in 2021 with EGD/Colonoscopy 07/2020 which showed no varices      PLAN: -proceed with C2 Tecentriq and Avastin today -will give laculose 13m in infusion today and give one more dose for home, I called in refill to his pharmacy today  -labs, f/u, and C3 in 3 weeks   No problem-specific Assessment &  Plan notes found for this encounter.   SUMMARY OF ONCOLOGIC HISTORY: Oncology History Overview Note  Cancer Staging Hepatocellular carcinoma [facility]) Staging form: Liver, AJCC 8th Edition - Clinical stage from  02/09/2021: Stage IIIA (cT3, cN0, cM0) - Signed by Truitt Merle, MD on 02/09/2021 Stage prefix: Initial diagnosis    Hepatocellular carcinoma ([place])  05/12/2020 Imaging   MRI Liver  IMPRESSION: 1. Geographic lesion in the inferior medial aspect of the RIGHT hepatic lobe has some imaging features suggesting focal fatty infiltration. However, atypical enhancement pattern. Recommend follow-up contrast MRI in 3 to 6 months to re-evaluate. 2. No additional lesion liver. 3. Morphologic changes consistent with cirrhosis.   01/12/2021 Imaging   MRI Abdomen  IMPRESSION: 1. Advanced cirrhotic changes involving the liver. There is a large infiltrating mass versus numerous confluent masses involving the right hepatic lobe and consistent with hepatocellular carcinoma. Associated thrombosed middle and portal veins. No definite left hepatic lobe lesions. LI-RADS 5. 2. Borderline splenomegaly. 3. No ascites or abdominal wall hernia.     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 The ultrasound correlate of the previously demonstrated right liver tumor is heterogeneous tissue with ill-defined margin.   In addition to the right-sided tumor, there are several small hyperechoic foci of approximately 1 cm, potentially additional [place] foci versus regenerative nodules.   Cirrhosis and splenomegaly   01/15/2021 Imaging   CT Chest  IMPRESSION: 1. No evidence of metastatic disease in the chest. 2. Cirrhotic morphology of the liver with ill-defined, heterogeneous mass of the right lobe of the liver and occlusion of the right portal vein, in keeping with known hepatocellular carcinoma, better demonstrated by recent MR.   02/09/2021 Initial Diagnosis   Hepatocellular carcinoma ([place])   02/09/2021 Cancer Staging   Staging  form: Liver, AJCC 8th Edition - Clinical stage from 02/09/2021: Stage IIIA (cT3, cN0, cM0) - Signed by Truitt Merle, MD on 02/09/2021 Stage prefix: Initial diagnosis   05/29/2021 Imaging   MRI Abdomen  IMPRESSION: 1. Interval decrease in size of the infiltrating mass in the right hepatic lobe showing heterogeneous arterial phase hyperenhancement. Persistent arterial phase enhancement can be seen early post y 31 embolization. No substantial restricted diffusion in the lesion on today's study. 2. Tiny focus of arterial phase hyperenhancement in the dome of the liver near the junction of segment IV and VIII. Unclear whether this was present previously given the marked motion degradation on the earlier study. Close attention on follow-up recommended. 3. 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 Subtraction postcontrast imaging suggests that there may be some enhancement in the thrombus suggesting tumor thrombus or a combination of tumor and bland thrombus. 4. Interval increase in size of the hepatoduodenal ligament lymph node measuring 2.0 x 1.7 cm. Likely metastatic disease,  continued close attention on follow-up recommended. 5. Markedly distended urinary bladder.   06/29/2021 -  Chemotherapy    Patient is on Treatment Plan: LUNG ATEZOLIZUMAB + BEVACIZUMAB Q21D MAINTENANCE          CURRENT THERAPY:  Tecentriq and Avastin, q3weeks, starting 06/29/21  INTERVAL HISTORY:  Jim Little is here for a follow up of [place]. He was last seen by me on 06/29/21.  He presents to the clinic accompanied by an interpreter. He reports he is in a lot of pain today, in the left abdominal area around the belt. He notes constipation, which just developed last night. He denies nausea and reports he is still passing gas. He has a prior prescription for lactulose but has not refilled it  since it was initially prescribed 11/27/20 by his PCP. He also has two remaining dulcolax but has not taken them. He notes his scrotal swelling and pain has gone down. His main pain is in his abdomen.   All other systems were reviewed with the patient and are negative.  MEDICAL HISTORY:  Past Medical History:  Diagnosis Date   BPH (benign prostatic hyperplasia)    Cirrhosis ([place])    Hypertension    Splenomegaly     SURGICAL HISTORY: Past Surgical History:  Procedure Laterality Date   BIOPSY  08/22/2020   Procedure: BIOPSY;  Surgeon: Ronnette Juniper, MD;  Location: [facility];  Service: Gastroenterology;;   COLONOSCOPY WITH PROPOFOL N/A 08/22/2020   Procedure: COLONOSCOPY WITH PROPOFOL;  Surgeon: Ronnette Juniper, MD;  Location: [facility];  Service: Gastroenterology;  Laterality: N/A;   ESOPHAGOGASTRODUODENOSCOPY (EGD) WITH PROPOFOL N/A 08/22/2020   Procedure: ESOPHAGOGASTRODUODENOSCOPY (EGD) WITH PROPOFOL;  Surgeon: Ronnette Juniper, MD;  Location: [facility];  Service: Gastroenterology;  Laterality: N/A;   IR ANGIOGRAM SELECTIVE EACH ADDITIONAL VESSEL  03/01/2021   IR ANGIOGRAM SELECTIVE EACH ADDITIONAL VESSEL  03/08/2021   IR ANGIOGRAM VISCERAL SELECTIVE  03/01/2021   IR ANGIOGRAM VISCERAL SELECTIVE  03/01/2021   IR ANGIOGRAM VISCERAL SELECTIVE  03/08/2021   IR EMBO TUMOR ORGAN ISCHEMIA INFARCT INC GUIDE ROADMAPPING  03/08/2021   IR RADIOLOGIST EVAL & MGMT  02/06/2021   IR RADIOLOGIST EVAL & MGMT  05/31/2021   IR US GUIDE VASC ACCESS RIGHT  03/01/2021   IR US GUIDE VASC ACCESS RIGHT  03/08/2021   UPPER GASTROINTESTINAL ENDOSCOPY  03/2018   [place] --candidiasis plus esophageal varices grade 2.  Treated with fluconazole and status post esophageal banding.   US ECHOCARDIOGRAPHY  10/2016   per overseas records from [place] - LVEF 68%; mildly calcified aortic valve, normal function otherwise    I have reviewed the social history and family history with the patient and they are unchanged from  previous note.  ALLERGIES:  is allergic to pork-derived products.  MEDICATIONS:  Current Outpatient Medications  Medication Sig Dispense Refill   acetaminophen (TYLENOL) 500 MG tablet Take 500 mg by mouth 2 (two) times daily as needed for mild pain.     amLODipine (NORVASC) 5 MG tablet Take 1 tablet (5 mg total) by mouth daily. 90 tablet 3   lactulose (CEPHULAC) 10 g packet Take 1 packet (10 g total) by mouth as needed (constipation). Dissolve in 8 ounces of water 30 each 1   omeprazole (PRILOSEC) 40 MG capsule Take 1 capsule (40 mg total) by mouth daily. 90 capsule 3   tamsulosin (FLOMAX) 0.4 MG CAPS capsule Take 1 capsule (0.4 mg total) by mouth daily. Please pack and mail to home 90 capsule 3   traMADol (ULTRAM) 50 MG tablet Take 1 tablet (50 mg total) by mouth every 12 (twelve) hours as needed for severe pain. 20 tablet 0   No current facility-administered medications for this visit.   Facility-Administered Medications Ordered in Other Visits  Medication Dose Route Frequency Provider Last Rate Last Admin   bevacizumab-bvzr (ZIRABEV) 1,000 mg in sodium chloride 0.9 % 100 mL chemo infusion  15 mg/kg (Treatment Plan Recorded) Intravenous Once Truitt Merle, MD       lactulose (CHRONULAC) 10 GM/15ML solution 20 g  20 g Oral NOW Truitt Merle, MD       lactulose (CHRONULAC) 10 GM/15ML solution 20 g  20 g Oral Once PRN Truitt Merle, MD        PHYSICAL EXAMINATION: ECOG PERFORMANCE STATUS: 2 - Symptomatic, <50% confined to bed  Vitals:   07/20/21 1119  BP: (!) 174/85  Pulse: 84  Resp: 19  Temp: 98.5 F (36.9 C)  SpO2: 99%   Wt Readings from Last 3 Encounters:  07/20/21 140 lb (63.5 kg)  06/29/21 136 lb (61.7 kg)  06/15/21 140 lb 3.2 oz (63.6 kg)     GENERAL:alert, no distress and comfortable SKIN: skin color, texture, turgor are normal, no rashes or significant lesions EYES: normal, Conjunctiva are pink and non-injected, sclera clear  NECK: supple, thyroid normal size, non-tender,  without nodularity LYMPH:  no palpable lymphadenopathy in the cervical, axillary  LUNGS: clear to auscultation and percussion with normal breathing effort HEART: regular rate & rhythm and no murmurs and no lower extremity edema ABDOMEN: abdomen soft; (+) tenderness in left lower quadrant, no rebound Musculoskeletal:no cyanosis of digits and no clubbing  NEURO: alert & oriented x 3 with fluent speech, no focal motor/sensory deficits  LABORATORY DATA:  I have reviewed the data as listed CBC Latest Ref Rng & Units 07/20/2021 06/29/2021 05/24/2021  WBC 4.0 - 10.5 K/uL 5.7 4.3 4.0  Hemoglobin 13.0 - 17.0 g/dL 13.3 12.5(L) 12.3(L)  Hematocrit 39.0 - 52.0 % 39.3 36.7(L) 36.0(L)  Platelets 150 - 400 K/uL 144(L) 156 138(L)     CMP Latest Ref Rng & Units 07/20/2021 06/29/2021 05/24/2021  Glucose 70 - 99 mg/dL 82 89 78  BUN 8 - 23 mg/dL '11 10 8  '$ Creatinine 0.61 - 1.24 mg/dL 1.03 0.99 0.92  Sodium 135 - 145 mmol/L 138 139 138  Potassium 3.5 - 5.1 mmol/L 4.4 4.6 4.1  Chloride 98 - 111 mmol/L 105 104 106  CO2 22 - 32 mmol/L '26 28 25  '$ Calcium 8.9 - 10.3 mg/dL 9.4 9.5 9.3  Total Protein 6.5 - 8.1 g/dL 9.3(H) 9.3(H) 9.0(H)  Total Bilirubin 0.3 - 1.2 mg/dL 0.7 0.5 0.9  Alkaline Phos 38 - 126 U/L 196(H) 202(H) 168(H)  AST 15 - 41 U/L 46(H) 60(H) 39  ALT 0 - 44 U/L 33 34 20      RADIOGRAPHIC STUDIES: I have personally reviewed the radiological images as listed and agreed with the findings in the report. No results found.    No orders of the defined types were placed in this encounter.  All questions were answered. The patient knows to call the clinic with any problems, questions or concerns. No barriers to learning was detected. The total time spent in the appointment was 30 minutes.     Truitt Merle, MD 07/20/2021   I, Wilburn Mylar, am acting as scribe for Truitt Merle, MD.   I have reviewed the above documentation for accuracy and completeness, and I agree with the above.

## 2021-07-23 ENCOUNTER — Telehealth: Payer: Self-pay

## 2021-07-23 ENCOUNTER — Telehealth: Payer: Self-pay | Admitting: Hematology

## 2021-07-23 NOTE — Telephone Encounter (Signed)
Appointment scheduled with [facility] for 08/10/21 at 10 am. Patient made aware. Transportation assistance will be provided.  Honor Loh RN [facility]  [facility] [phone]

## 2021-07-23 NOTE — Telephone Encounter (Signed)
Scheduled follow-up appointment per 8/26 los. Patient's interpretor is aware.

## 2021-07-23 NOTE — Telephone Encounter (Signed)
[facility] contacted and confirmed lactulose will be delivered to patient residence tomorrow.  Honor Loh RN [facility]  [facility] [phone]

## 2021-07-24 ENCOUNTER — Emergency Department (HOSPITAL_COMMUNITY)
Admission: EM | Admit: 2021-07-24 | Discharge: 2021-07-24 | Disposition: A | Discharge status: Home / Self Care | Payer: Medicaid Other | Attending: Emergency Medicine | Admitting: Emergency Medicine

## 2021-07-24 ENCOUNTER — Other Ambulatory Visit: Payer: Self-pay

## 2021-07-24 ENCOUNTER — Emergency Department (HOSPITAL_COMMUNITY): Payer: Medicaid Other

## 2021-07-24 ENCOUNTER — Telehealth: Payer: Self-pay

## 2021-07-24 DIAGNOSIS — W19XXXA Unspecified fall, initial encounter: Secondary | ICD-10-CM | POA: Insufficient documentation

## 2021-07-24 DIAGNOSIS — I10 Essential (primary) hypertension: Secondary | ICD-10-CM | POA: Diagnosis not present

## 2021-07-24 DIAGNOSIS — R140 Abdominal distension (gaseous): Secondary | ICD-10-CM | POA: Diagnosis not present

## 2021-07-24 DIAGNOSIS — C220 Liver cell carcinoma: Secondary | ICD-10-CM | POA: Diagnosis not present

## 2021-07-24 DIAGNOSIS — R1032 Left lower quadrant pain: Secondary | ICD-10-CM | POA: Diagnosis present

## 2021-07-24 DIAGNOSIS — R531 Weakness: Secondary | ICD-10-CM | POA: Diagnosis not present

## 2021-07-24 DIAGNOSIS — Z79899 Other long term (current) drug therapy: Secondary | ICD-10-CM | POA: Insufficient documentation

## 2021-07-24 LAB — CBC WITH DIFFERENTIAL/PLATELET
Abs Immature Granulocytes: 0.01 10*3/uL (ref 0.00–0.07)
Basophils Absolute: 0 10*3/uL (ref 0.0–0.1)
Basophils Relative: 0 %
Eosinophils Absolute: 0.1 10*3/uL (ref 0.0–0.5)
Eosinophils Relative: 1 %
HCT: 41.5 % (ref 39.0–52.0)
Hemoglobin: 14 g/dL (ref 13.0–17.0)
Immature Granulocytes: 0 %
Lymphocytes Relative: 12 %
Lymphs Abs: 1 10*3/uL (ref 0.7–4.0)
MCH: 34.1 pg — ABNORMAL HIGH (ref 26.0–34.0)
MCHC: 33.7 g/dL (ref 30.0–36.0)
MCV: 101 fL — ABNORMAL HIGH (ref 80.0–100.0)
Monocytes Absolute: 1.3 10*3/uL — ABNORMAL HIGH (ref 0.1–1.0)
Monocytes Relative: 17 %
Neutro Abs: 5.5 10*3/uL (ref 1.7–7.7)
Neutrophils Relative %: 70 %
Platelets: 146 10*3/uL — ABNORMAL LOW (ref 150–400)
RBC: 4.11 MIL/uL — ABNORMAL LOW (ref 4.22–5.81)
RDW: 14.6 % (ref 11.5–15.5)
WBC: 7.9 10*3/uL (ref 4.0–10.5)
nRBC: 0 % (ref 0.0–0.2)

## 2021-07-24 LAB — COMPREHENSIVE METABOLIC PANEL
ALT: 26 U/L (ref 0–44)
AST: 33 U/L (ref 15–41)
Albumin: 3.2 g/dL — ABNORMAL LOW (ref 3.5–5.0)
Alkaline Phosphatase: 152 U/L — ABNORMAL HIGH (ref 38–126)
Anion gap: 7 (ref 5–15)
BUN: 12 mg/dL (ref 8–23)
CO2: 26 mmol/L (ref 22–32)
Calcium: 9.4 mg/dL (ref 8.9–10.3)
Chloride: 106 mmol/L (ref 98–111)
Creatinine, Ser: 0.86 mg/dL (ref 0.61–1.24)
GFR, Estimated: >60 mL/min (ref >60)
Glucose, Bld: 107 mg/dL — ABNORMAL HIGH (ref 70–99)
Potassium: 4.3 mmol/L (ref 3.5–5.1)
Sodium: 139 mmol/L (ref 135–145)
Total Bilirubin: 1 mg/dL (ref 0.3–1.2)
Total Protein: 9.1 g/dL — ABNORMAL HIGH (ref 6.5–8.1)

## 2021-07-24 LAB — URINALYSIS, ROUTINE W REFLEX MICROSCOPIC
Bilirubin Urine: NEGATIVE
Glucose, UA: NEGATIVE mg/dL
Ketones, ur: NEGATIVE mg/dL
Nitrite: NEGATIVE
Protein, ur: 30 mg/dL — AB
Specific Gravity, Urine: 1.038 — ABNORMAL HIGH (ref 1.005–1.030)
WBC, UA: >50 WBC/hpf — ABNORMAL HIGH (ref 0–5)
pH: 7 (ref 5.0–8.0)

## 2021-07-24 LAB — RAPID URINE DRUG SCREEN, HOSP PERFORMED
Amphetamines: NOT DETECTED
Barbiturates: NOT DETECTED
Benzodiazepines: NOT DETECTED
Cocaine: NOT DETECTED
Opiates: NOT DETECTED
Tetrahydrocannabinol: NOT DETECTED

## 2021-07-24 LAB — LIPASE, BLOOD: Lipase: 28 U/L (ref 11–51)

## 2021-07-24 MED ORDER — IOHEXOL 350 MG/ML SOLN
100.0000 mL | Freq: Once | INTRAVENOUS | Status: AC | PRN
  Administered 2021-07-24: 80 mL via INTRAVENOUS

## 2021-07-24 MED ORDER — SODIUM CHLORIDE 0.9 % IV BOLUS
500.0000 mL | Freq: Once | INTRAVENOUS | Status: AC
  Administered 2021-07-24: 500 mL via INTRAVENOUS

## 2021-07-24 MED ORDER — FENTANYL CITRATE PF 50 MCG/ML IJ SOSY
50.0000 ug | PREFILLED_SYRINGE | INTRAMUSCULAR | Status: DC | PRN
  Administered 2021-07-24: 50 ug via INTRAVENOUS
  Filled 2021-07-24: qty 1

## 2021-07-24 MED ORDER — ONDANSETRON HCL 4 MG/2ML IJ SOLN
4.0000 mg | Freq: Once | INTRAMUSCULAR | Status: AC
  Administered 2021-07-24: 4 mg via INTRAVENOUS
  Filled 2021-07-24: qty 2

## 2021-07-24 MED ORDER — SODIUM CHLORIDE 0.9 % IV SOLN: INTRAVENOUS | Status: DC

## 2021-07-24 NOTE — ED Notes (Signed)
Used interpreter to hourly round on patient. Pt verbalizes understanding that we are waiting for [facility] to transport pt and it is possible that it will be in the middle of the night or in the am. Pt provided with sandwich and juice. Pt has urinal at bedside. Will continue to monitor

## 2021-07-24 NOTE — Discharge Instructions (Addendum)
The testing today did not show any serious problems.  Follow-up with your regular doctors for further care and treatment as needed.  Take Tylenol, 650 mg, every 4 hours if needed for pain.

## 2021-07-24 NOTE — ED Triage Notes (Signed)
Pt presents to ED via ems cc abd pain. Pt states abd pain and decreased ability to move around since last night. Pt states chronic constipation and last BM was two weeks ago. Pt denies fever/chills/nausea/vomiting. A&ox4.

## 2021-07-24 NOTE — ED Triage Notes (Signed)
[place]ili interpretor used

## 2021-07-24 NOTE — ED Provider Notes (Signed)
[facility] DEPT Provider Note   CSN: [id number] Arrival date & time: 07/24/21  1057     History Chief Complaint  Patient presents with   Abdominal Pain    Jim Little is a 75 y.o. male.  HPI He presents for evaluation of abdominal discomfort, left lower, and no bowel movement for 2 weeks.  He denies fever, chills, cough, shortness of breath, nausea or vomiting.  He is taking some sort of bowel preparation, but cannot recall what it is and has not had improvement of his stooling.  He is currently being treated for hepatocellular carcinoma, with chemotherapy infusions.  He was interviewed using a Swahili Ecologist by televideo.  There are no other known active modifying factors.    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/27/2021   Hepatocellular carcinoma ([place]) 02/09/2021   Abdominal pain 02/09/2021   Hypertension 02/09/2021   Weight loss 02/09/2021   Portal vein thrombosis 01/13/2021   Urinary retention 05/11/2020   Thrombocytopenia ([place]) 05/11/2020   Bilateral cataracts 07/15/2018   Constipation 05/06/2018   Refugee health examination 04/16/2018   Splenomegaly 04/16/2018   Chronic back pain 04/16/2018   Hepatic cirrhosis ([place]) 04/16/2018   HTN (hypertension) 04/16/2018    Past Surgical History:  Procedure Laterality Date   BIOPSY  08/22/2020   Procedure: BIOPSY;  Surgeon: Ronnette Juniper, MD;  Location: [facility];  Service: Gastroenterology;;   COLONOSCOPY WITH PROPOFOL N/A 08/22/2020   Procedure: COLONOSCOPY WITH PROPOFOL;  Surgeon: Ronnette Juniper, MD;  Location: [facility];  Service: Gastroenterology;  Laterality: N/A;   ESOPHAGOGASTRODUODENOSCOPY (EGD) WITH PROPOFOL N/A 08/22/2020   Procedure:  ESOPHAGOGASTRODUODENOSCOPY (EGD) WITH PROPOFOL;  Surgeon: Ronnette Juniper, MD;  Location: [facility];  Service: Gastroenterology;  Laterality: N/A;   IR ANGIOGRAM SELECTIVE EACH ADDITIONAL VESSEL  03/01/2021   IR ANGIOGRAM SELECTIVE EACH ADDITIONAL VESSEL  03/08/2021   IR ANGIOGRAM VISCERAL SELECTIVE  03/01/2021   IR ANGIOGRAM VISCERAL SELECTIVE  03/01/2021   IR ANGIOGRAM VISCERAL SELECTIVE  03/08/2021   IR EMBO TUMOR ORGAN ISCHEMIA INFARCT INC GUIDE ROADMAPPING  03/08/2021   IR RADIOLOGIST EVAL & MGMT  02/06/2021   IR RADIOLOGIST EVAL & MGMT  05/31/2021   IR US GUIDE VASC ACCESS RIGHT  03/01/2021   IR US GUIDE VASC ACCESS RIGHT  03/08/2021   UPPER GASTROINTESTINAL ENDOSCOPY  03/2018   Per overseas records --candidiasis plus esophageal varices grade 2.  Treated with fluconazole and status post esophageal banding.   US ECHOCARDIOGRAPHY  10/2016   per overseas records from [place] - LVEF 68%; mildly calcified aortic valve, normal function otherwise       No family history on file.  Social History   Tobacco Use   Smoking status: Never   Smokeless tobacco: Never  Vaping Use   Vaping Use: Never used  Substance Use Topics   Alcohol use: Never   Drug use: Not Currently    Home Medications Prior to Admission medications   Medication Sig Start Date End Date Taking? Authorizing Provider  acetaminophen (TYLENOL) 500 MG tablet Take 500 mg by mouth 2 (two) times daily as needed for mild pain.    [provider]  amLODipine (NORVASC) 5 MG tablet Take 1 tablet (5 mg  total) by mouth daily. 01/31/21   [name], MD  lactulose (CEPHULAC) 10 g packet Take 1 packet (10 g total) by mouth as needed (constipation). Dissolve in 8 ounces of water 07/20/21   Truitt Merle, MD  omeprazole (PRILOSEC) 40 MG capsule Take 1 capsule (40 mg total) by mouth daily. 03/10/21   [name], MD  tamsulosin (FLOMAX) 0.4 MG CAPS capsule Take 1 capsule (0.4 mg total) by mouth daily. Please pack and mail to home 01/31/21    [name], MD  traMADol (ULTRAM) 50 MG tablet Take 1 tablet (50 mg total) by mouth every 12 (twelve) hours as needed for severe pain. 04/10/21   Autry-Lott, Naaman Plummer, DO    Allergies    Pork-derived products  Review of Systems   Review of Systems  All other systems reviewed and are negative.  Physical Exam Updated Vital Signs BP (!) 151/81   Pulse 74   Temp 99.7 F (37.6 C) (Oral)   Resp 18   Ht '5\' 6"'$  (1.676 m)   Wt 63.5 kg   SpO2 95%   BMI 22.60 kg/m   Physical Exam Vitals and nursing note reviewed.  Constitutional:      Appearance: He is well-developed. He is not ill-appearing.  HENT:     Head: Normocephalic and atraumatic.     Right Ear: External ear normal.     Left Ear: External ear normal.  Eyes:     Conjunctiva/sclera: Conjunctivae normal.     Pupils: Pupils are equal, round, and reactive to light.  Neck:     Trachea: Phonation normal.  Cardiovascular:     Rate and Rhythm: Normal rate and regular rhythm.     Heart sounds: Normal heart sounds.  Pulmonary:     Effort: Pulmonary effort is normal.     Breath sounds: Normal breath sounds.  Abdominal:     General: There is distension.     Palpations: Abdomen is soft.     Tenderness: There is abdominal tenderness (Left lower, mild). There is no guarding or rebound.     Hernia: No hernia is present.  Musculoskeletal:        General: Normal range of motion.     Cervical back: Normal range of motion and neck supple.  Skin:    General: Skin is warm and dry.  Neurological:     Mental Status: He is alert and oriented to person, place, and time.     Cranial Nerves: No cranial nerve deficit.     Sensory: No sensory deficit.     Motor: No abnormal muscle tone.     Coordination: Coordination normal.  Psychiatric:        Mood and Affect: Mood normal.        Behavior: Behavior normal.        Thought Content: Thought content normal.        Judgment: Judgment normal.    ED Results / Procedures / Treatments    Labs (all labs ordered are listed, but only abnormal results are displayed) Labs Reviewed  COMPREHENSIVE METABOLIC PANEL - Abnormal; Notable for the following components:      Result Value   Glucose, Bld 107 (*)    Total Protein 9.1 (*)    Albumin 3.2 (*)    Alkaline Phosphatase 152 (*)    All other components within normal limits  CBC WITH DIFFERENTIAL/PLATELET - Abnormal; Notable for the following components:   RBC 4.11 (*)    MCV 101.0 (*)  MCH 34.1 (*)    Platelets 146 (*)    Monocytes Absolute 1.3 (*)    All other components within normal limits  LIPASE, BLOOD  ETHANOL  URINALYSIS, ROUTINE W REFLEX MICROSCOPIC  RAPID URINE DRUG SCREEN, HOSP PERFORMED    EKG None  Radiology No results found.  Procedures Procedures   Medications Ordered in ED Medications  0.9 %  sodium chloride infusion ( Intravenous New Bag/Given 07/24/21 1137)  fentaNYL (SUBLIMAZE) injection 50 mcg (50 mcg Intravenous Given 07/24/21 1137)  sodium chloride 0.9 % bolus 500 mL (500 mLs Intravenous New Bag/Given 07/24/21 1136)  ondansetron (ZOFRAN) injection 4 mg (4 mg Intravenous Given 07/24/21 1136)    ED Course  I have reviewed the triage vital signs and the nursing notes.  Pertinent labs & imaging results that were available during my care of the patient were reviewed by me and considered in my medical decision making (see chart for details).    MDM Rules/Calculators/A&P                            Patient Vitals for the past 24 hrs:  BP Temp Temp src Pulse Resp SpO2 Height Weight  07/24/21 1300 (!) 151/81 -- -- 74 18 95 % -- --  07/24/21 1200 (!) 151/82 -- -- 83 19 95 % -- --  07/24/21 1114 -- -- -- -- -- -- '5\' 6"'$  (1.676 m) 63.5 kg  07/24/21 1113 (!) 165/93 99.7 F (37.6 C) Oral 94 18 97 % -- --    At time of discharge Reevaluation with update and discussion. After initial assessment and treatment, an updated evaluation reveals he remains comfortable has no further complaints. Daleen Bo   Medical Decision Making:  This patient is presenting for evaluation of abdominal discomfort, which does require a range of treatment options, and is a complaint that involves a moderate risk of morbidity and mortality. The differential diagnoses include constipation, complications of cancer, intra-abdominal infection. I decided to review old records, and in summary elderly male with hepatocellular carcinoma, currently under treatment with infusion 3 days ago I did not require additional historical information from anyone.  Clinical Laboratory Tests Ordered, included CBC and Metabolic panel, lipase urine drug screen. Review indicates normal except glucose elevated. Radiologic Tests Ordered, included CT abdomen pelvis.  I independently Visualized: Radiograph images, which show stable to improving chronic findings    Critical Interventions-clinical evaluation, laboratory testing, CT imaging, medication treatment, observation and reassessment  After These Interventions, the Patient was reevaluated and was found   CRITICAL CARE-no Performed by: Daleen Bo  Nursing Notes Reviewed/ Care Coordinated Applicable Imaging Reviewed Interpretation of Laboratory Data incorporated into ED treatment      Final Clinical Impression(s) / ED Diagnoses Final diagnoses:  Fall, initial encounter  Weakness  Hepatocellular carcinoma [facility])    Rx / DC Orders ED Discharge Orders     None        Daleen Bo, MD 07/24/21 1657

## 2021-07-24 NOTE — Telephone Encounter (Signed)
Patient contacted me unable to get out of bed upon waking up this morning. Complaining of generalized weakness,feverish and pain to his groin area. EMS contacted on his behalf.EMS will take him to [facility] ER.  Honor Loh RN [facility]  [facility] [id number]

## 2021-07-24 NOTE — ED Notes (Signed)
PTAR present to transport pt home. IV removed and interpreter used to communicate with pt about being transported at this time.

## 2021-07-25 ENCOUNTER — Telehealth: Payer: Self-pay

## 2021-07-25 ENCOUNTER — Other Ambulatory Visit: Payer: Medicaid Other

## 2021-07-25 ENCOUNTER — Ambulatory Visit: Payer: Medicaid Other | Admitting: Hematology

## 2021-07-25 NOTE — Telephone Encounter (Signed)
Patient contacted to follow up after ER visit. Patient resting at home, he reports to be be very weak and unable to get out of bed without assistance. I confirmed that lactulose was delivered yesterday. He has not had a bowel movement as well. I advised his wife to mix I packet with 8 ounces of water and take as needed. She will give him one dose as soon as possible.  I will continue to follow up as needed.  Honor Loh RN [facility]  [facility] [id number]

## 2021-08-10 ENCOUNTER — Inpatient Hospital Stay: Payer: Medicaid Other | Attending: Hematology | Admitting: Hematology

## 2021-08-10 ENCOUNTER — Inpatient Hospital Stay: Payer: Medicaid Other

## 2021-08-13 ENCOUNTER — Telehealth: Payer: Self-pay

## 2021-08-13 NOTE — Telephone Encounter (Signed)
Noted a missed appointment on 08/10/21. Unfortunately I did not get a reminder. I  called and left a voice mail for be rescheduled.  [name] Derenda Giddings RN [facility]Sn [place] [id number]

## 2021-08-23 ENCOUNTER — Telehealth: Payer: Self-pay

## 2021-08-23 NOTE — Telephone Encounter (Signed)
This nurse reached out to patient with the use of pacific interpreters to inform patient of scheduled lab and infusion date and time.  Patient states that he will be here as long as he can get transportation. This nurse asked patient if he would like a request to be sent for Transportation services.  Patient was in agreement.  Intake form was submitted.  Patient request that his interpreter be called with information about transportation.  No further questions or concerns at this time.

## 2021-08-27 ENCOUNTER — Telehealth: Payer: Self-pay

## 2021-08-27 NOTE — Telephone Encounter (Signed)
Contacted [employer] and confirmed transportation is set up for appointment on 08/29/21.  [name] Rebecca Cairns RN BSn [place] [phone]

## 2021-08-29 ENCOUNTER — Other Ambulatory Visit: Payer: Self-pay

## 2021-08-29 ENCOUNTER — Inpatient Hospital Stay (HOSPITAL_BASED_OUTPATIENT_CLINIC_OR_DEPARTMENT_OTHER): Payer: Medicaid Other | Admitting: Hematology

## 2021-08-29 ENCOUNTER — Telehealth: Payer: Self-pay

## 2021-08-29 ENCOUNTER — Inpatient Hospital Stay: Payer: Medicaid Other | Attending: Hematology

## 2021-08-29 ENCOUNTER — Inpatient Hospital Stay: Payer: Medicaid Other

## 2021-08-29 ENCOUNTER — Encounter: Payer: Self-pay | Admitting: Hematology

## 2021-08-29 VITALS — BP 147/72 | HR 71 | Temp 98.8°F | Resp 16

## 2021-08-29 DIAGNOSIS — Z5112 Encounter for antineoplastic immunotherapy: Secondary | ICD-10-CM | POA: Diagnosis not present

## 2021-08-29 DIAGNOSIS — C779 Secondary and unspecified malignant neoplasm of lymph node, unspecified: Secondary | ICD-10-CM | POA: Insufficient documentation

## 2021-08-29 DIAGNOSIS — C220 Liver cell carcinoma: Secondary | ICD-10-CM

## 2021-08-29 LAB — CMP (CANCER CENTER ONLY)
ALT: 39 U/L (ref 0–44)
AST: 49 U/L — ABNORMAL HIGH (ref 15–41)
Albumin: 3.5 g/dL (ref 3.5–5.0)
Alkaline Phosphatase: 181 U/L — ABNORMAL HIGH (ref 38–126)
Anion gap: 8 (ref 5–15)
BUN: 15 mg/dL (ref 8–23)
CO2: 27 mmol/L (ref 22–32)
Calcium: 9.4 mg/dL (ref 8.9–10.3)
Chloride: 104 mmol/L (ref 98–111)
Creatinine: 1.02 mg/dL (ref 0.61–1.24)
GFR, Estimated: >60 mL/min (ref >60)
Glucose, Bld: 106 mg/dL — ABNORMAL HIGH (ref 70–99)
Potassium: 4.1 mmol/L (ref 3.5–5.1)
Sodium: 139 mmol/L (ref 135–145)
Total Bilirubin: 0.8 mg/dL (ref 0.3–1.2)
Total Protein: 9 g/dL — ABNORMAL HIGH (ref 6.5–8.1)

## 2021-08-29 LAB — CBC WITH DIFFERENTIAL (CANCER CENTER ONLY)
Abs Immature Granulocytes: 0.01 10*3/uL (ref 0.00–0.07)
Basophils Absolute: 0 10*3/uL (ref 0.0–0.1)
Basophils Relative: 0 %
Eosinophils Absolute: 0.2 10*3/uL (ref 0.0–0.5)
Eosinophils Relative: 5 %
HCT: 38.6 % — ABNORMAL LOW (ref 39.0–52.0)
Hemoglobin: 13 g/dL (ref 13.0–17.0)
Immature Granulocytes: 0 %
Lymphocytes Relative: 26 %
Lymphs Abs: 1.2 10*3/uL (ref 0.7–4.0)
MCH: 34.1 pg — ABNORMAL HIGH (ref 26.0–34.0)
MCHC: 33.7 g/dL (ref 30.0–36.0)
MCV: 101.3 fL — ABNORMAL HIGH (ref 80.0–100.0)
Monocytes Absolute: 0.7 10*3/uL (ref 0.1–1.0)
Monocytes Relative: 14 %
Neutro Abs: 2.6 10*3/uL (ref 1.7–7.7)
Neutrophils Relative %: 55 %
Platelet Count: 134 10*3/uL — ABNORMAL LOW (ref 150–400)
RBC: 3.81 MIL/uL — ABNORMAL LOW (ref 4.22–5.81)
RDW: 14.5 % (ref 11.5–15.5)
WBC Count: 4.7 10*3/uL (ref 4.0–10.5)
nRBC: 0 % (ref 0.0–0.2)

## 2021-08-29 LAB — TOTAL PROTEIN, URINE DIPSTICK: Protein, ur: 100 mg/dL — AB

## 2021-08-29 LAB — PROTIME-INR
INR: 1.2 (ref 0.8–1.2)
Prothrombin Time: 15.3 seconds — ABNORMAL HIGH (ref 11.4–15.2)

## 2021-08-29 LAB — TSH: TSH: 1.843 u[IU]/mL (ref 0.320–4.118)

## 2021-08-29 MED ORDER — SODIUM CHLORIDE 0.9 % IV SOLN: Freq: Once | INTRAVENOUS | Status: AC

## 2021-08-29 MED ORDER — SODIUM CHLORIDE 0.9 % IV SOLN
1200.0000 mg | Freq: Once | INTRAVENOUS | Status: AC
  Administered 2021-08-29: 1200 mg via INTRAVENOUS
  Filled 2021-08-29: qty 20

## 2021-08-29 MED ORDER — SODIUM CHLORIDE 0.9 % IV SOLN
15.0000 mg/kg | Freq: Once | INTRAVENOUS | Status: AC
  Administered 2021-08-29: 1000 mg via INTRAVENOUS
  Filled 2021-08-29: qty 32

## 2021-08-29 NOTE — Patient Instructions (Signed)
[facility] ONCOLOGY   Discharge Instructions: Thank you for choosing [facility] to provide your oncology and hematology care.   If you have a lab appointment with the [facility], please go directly to the [facility] and check in at the registration area.   Wear comfortable clothing and clothing appropriate for easy access to any Portacath or PICC line.   We strive to give you quality time with your provider. You may need to reschedule your appointment if you arrive late (15 or more minutes).  Arriving late affects you and other patients whose appointments are after yours.  Also, if you miss three or more appointments without notifying the office, you may be dismissed from the clinic at the provider's discretion.      For prescription refill requests, have your pharmacy contact our office and allow 72 hours for refills to be completed.    Today you received the following chemotherapy and/or immunotherapy agents: atezolizumab and bevacizumab.      To help prevent nausea and vomiting after your treatment, we encourage you to take your nausea medication as directed.  BELOW ARE SYMPTOMS THAT SHOULD BE REPORTED IMMEDIATELY: *FEVER GREATER THAN 100.4 F (38 C) OR HIGHER *CHILLS OR SWEATING *NAUSEA AND VOMITING THAT IS NOT CONTROLLED WITH YOUR NAUSEA MEDICATION *UNUSUAL SHORTNESS OF BREATH *UNUSUAL BRUISING OR BLEEDING *URINARY PROBLEMS (pain or burning when urinating, or frequent urination) *BOWEL PROBLEMS (unusual diarrhea, constipation, pain near the anus) TENDERNESS IN MOUTH AND THROAT WITH OR WITHOUT PRESENCE OF ULCERS (sore throat, sores in mouth, or a toothache) UNUSUAL RASH, SWELLING OR PAIN  UNUSUAL VAGINAL DISCHARGE OR ITCHING   Items with * indicate a potential emergency and should be followed up as soon as possible or go to the Emergency Department if any problems should occur.  Please show the CHEMOTHERAPY ALERT CARD or IMMUNOTHERAPY  ALERT CARD at check-in to the Emergency Department and triage nurse.  Should you have questions after your visit or need to cancel or reschedule your appointment, please contact [facility]  Dept: [phone]  and follow the prompts.  Office hours are 8:00 a.m. to 4:30 p.m. Monday - Friday. Please note that voicemails left after 4:00 p.m. may not be returned until the following business day.  We are closed weekends and major holidays. You have access to a nurse at all times for urgent questions. Please call the main number to the clinic Dept: [phone] and follow the prompts.   For any non-urgent questions, you may also contact your provider using MyChart. We now offer e-Visits for anyone 67 and older to request care online for non-urgent symptoms. For details visit [email or web address].   Also download the MyChart app! Go to the app store, search "MyChart", open the app, select [facility], and log in with your MyChart username and password.  Due to Covid, a mask is required upon entering the hospital/clinic. If you do not have a mask, one will be given to you upon arrival. For doctor visits, patients may have 1 support person aged 34 or older with them. For treatment visits, patients cannot have anyone with them due to current Covid guidelines and our immunocompromised population.

## 2021-08-29 NOTE — Progress Notes (Signed)
[place]   Telephone:([phone] Fax:([phone]) [phone]   Clinic Follow up Note   Patient Care Team: [name], MD as PCP - General (Family Medicine) Jonnie Finner, RN (Inactive) as Oncology Nurse Navigator Heilingoetter, Tobe Sos, PA-C as Physician Assistant (Physician Assistant) Truitt Merle, MD as Consulting Physician (Oncology)  Date of Service:  08/29/2021  CHIEF COMPLAINT: f/u of Jim Little  CURRENT THERAPY:  Tecentriq and Avastin, q3weeks, starting 06/29/21  ASSESSMENT & PLAN:  Jim Little is a 75 y.o. male with   1. Hepatocellular Carcinoma, unresectable, stage IIIA, with node metastasis  -Based on MRI liver 01/12/21 with Advanced cirrhotic changes involving the liver and Large infiltrating mass versus numerous confluent masses involving the right hepatic lobe. This is consistent with hepatocellular carcinoma. Associated thrombosed middle and portal veins. No definite left hepatic lobe lesions. AFP (01/14/21) 11,904.0. -His 01/15/21 CT of the chest did not show metastatic disease to the lungs. Staging workup completed. -He has unclear etiology of cirrhosis, which is followed by Dr. Therisa Doyne. Hep B surface antigen non reactive (05/15/20), ANA negative, Hep C ab non reactive. No alcohol abuse. -He is s/p Y 90 radioembolization on 03/08/21. His AFP has dropped significantly after Y90 -MRI scan from 05/29/2021 showed good response in liver, but he developed hepatoduodenal ligament lymph node measuring 2.0 x 1.7 cm, likely metastatic disease   -given his extrahepatic metastasis, I recommend systemic treatment Tecentriq and Avastin. Pt has not been compliant with oral medicine, oral TKI will be challenge for him.  -He had a EGD in 07/2020, showed no significant varices, okay to proceed Avastin -He started Tecentriq and Avastin on 06/29/21. He has tolerated relatively well, with constipation. -he missed last treatment due to transportation issue  -He is clinically stable, lab  reviewed, adequate for treatment, will proceed cycle 3 today -The CT scan from July 24, 2021 during his ED visit showed significant improvement of his liver cancer and lymph node, no new lesions.  He has had a good response to treatment so far   2. Constipation -improved -He has Dulcolax, MiraLAX, and lactulose   3. Scrotal swelling -developed following Y90 treatment -he is followed by [facility] for history of urinary retention and brought this to Dr. Keane Scrape attention at his last visit on 04/30/21. He underwent scrotal ultrasound for further evaluation, but I do not have the report. [name] notes he missed an appointment with urology while she was on vacation. He is scheduled for appointment on 08/31/21.   4. Social Support, Language barrier -From the [place], was at a refugee camp in [place] -Has several family members living in single house. 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 Neither him nor his family speak [place]  -He speaks Swahili. He has [facility], Honor Loh, who helps him. Her contact is [phone]   5. Portal vein thrombosis, Cirrhosis  -Seen with direct admission from 01/13/21-01/16/21 -Patient received heparin drip inpatient. Dr. Marin Olp did not recommend anticoagulation since the thrombus is tumor thrombus. -His liver cirrhosis is followed by Dr. Therisa Doyne from Gastroenterology. Prior work up in 2021 with EGD/Colonoscopy 07/2020 which showed no varices      PLAN: -proceed with C3 Tecentriq and Avastin today -labs, f/u, and C4 in 3 weeks   No problem-specific Assessment & Plan notes found for this encounter.   SUMMARY OF ONCOLOGIC HISTORY: Oncology History Overview Note  Cancer Staging Hepatocellular carcinoma [facility]) Staging form: Liver, AJCC  8th Edition - Clinical stage from 02/09/2021: Stage IIIA (cT3, cN0, cM0) - Signed by Truitt Merle, MD on 02/09/2021 Stage prefix: Initial  diagnosis    Hepatocellular carcinoma ([place])  05/12/2020 Imaging   MRI Liver  IMPRESSION: 1. Geographic lesion in the inferior medial aspect of the RIGHT hepatic lobe has some imaging features suggesting focal fatty infiltration. However, atypical enhancement pattern. Recommend follow-up contrast MRI in 3 to 6 months to re-evaluate. 2. No additional lesion liver. 3. Morphologic changes consistent with cirrhosis.   01/12/2021 Imaging   MRI Abdomen  IMPRESSION: 1. Advanced cirrhotic changes involving the liver. There is a large infiltrating mass versus numerous confluent masses involving the right hepatic lobe and consistent with hepatocellular carcinoma. Associated thrombosed middle and portal veins. No definite left hepatic lobe lesions. LI-RADS 5. 2. Borderline splenomegaly. 3. No ascites or abdominal wall hernia.     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 The ultrasound correlate of the previously demonstrated right liver tumor is heterogeneous tissue with ill-defined margin.   In addition to the right-sided tumor, there are several small hyperechoic foci of approximately 1 cm, potentially additional [place] foci versus regenerative nodules.   Cirrhosis and splenomegaly   01/15/2021 Imaging   CT Chest  IMPRESSION: 1. No evidence of metastatic disease in the chest. 2. Cirrhotic morphology of the liver with ill-defined, heterogeneous mass of the right lobe of the liver and occlusion of the right portal vein, in keeping with known hepatocellular carcinoma, better demonstrated by recent MR.   02/09/2021 Initial Diagnosis   Hepatocellular carcinoma ([place])   02/09/2021 Cancer Staging   Staging form: Liver, AJCC 8th Edition - Clinical stage from 02/09/2021: Stage IIIA (cT3, cN0, cM0) - Signed  by Truitt Merle, MD on 02/09/2021 Stage prefix: Initial diagnosis   05/29/2021 Imaging   MRI Abdomen  IMPRESSION: 1. Interval decrease in size of the infiltrating mass in the right hepatic lobe showing heterogeneous arterial phase hyperenhancement. Persistent arterial phase enhancement can be seen early post y 62 embolization. No substantial restricted diffusion in the lesion on today's study. 2. Tiny focus of arterial phase hyperenhancement in the dome of the liver near the junction of segment IV and VIII. Unclear whether this was present previously given the marked motion degradation on the earlier study. Close attention on follow-up recommended. 3. 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 Subtraction postcontrast imaging suggests that there may be some enhancement in the thrombus suggesting tumor thrombus or a combination of tumor and bland thrombus. 4. Interval increase in size of the hepatoduodenal ligament lymph node measuring 2.0 x 1.7 cm. Likely metastatic disease,  continued close attention on follow-up recommended. 5. Markedly distended urinary bladder.   06/29/2021 -  Chemotherapy   Patient is on Treatment Plan : LUNG Atezolizumab + Bevacizumab q21d Maintenance        INTERVAL HISTORY:  Jim Little is here for a follow up of [facility]. He was last seen by me on 07/20/21. He missed his last appointment on 08/10/21 due to transportation issue. He was seen in the infusion area.  He is clinically doing well.  He is constipation he has improved.  He reports occasional left low quadrant  abdominal pain, he is not taking any pain medication.  No abdominal bloating, nausea, or other symptoms.  He is weight is stable.   All other systems were reviewed with the patient and are negative.  MEDICAL HISTORY:  Past Medical History:  Diagnosis Date   BPH (benign  prostatic hyperplasia)    Cirrhosis ([place])    Hypertension    Splenomegaly     SURGICAL HISTORY: Past Surgical History:  Procedure Laterality Date   BIOPSY  08/22/2020   Procedure: BIOPSY;  Surgeon: Ronnette Juniper, MD;  Location: [facility];  Service: Gastroenterology;;   COLONOSCOPY WITH PROPOFOL N/A 08/22/2020   Procedure: COLONOSCOPY WITH PROPOFOL;  Surgeon: Ronnette Juniper, MD;  Location: [facility];  Service: Gastroenterology;  Laterality: N/A;   ESOPHAGOGASTRODUODENOSCOPY (EGD) WITH PROPOFOL N/A 08/22/2020   Procedure: ESOPHAGOGASTRODUODENOSCOPY (EGD) WITH PROPOFOL;  Surgeon: Ronnette Juniper, MD;  Location: [facility];  Service: Gastroenterology;  Laterality: N/A;   IR ANGIOGRAM SELECTIVE EACH ADDITIONAL VESSEL  03/01/2021   IR ANGIOGRAM SELECTIVE EACH ADDITIONAL VESSEL  03/08/2021   IR ANGIOGRAM VISCERAL SELECTIVE  03/01/2021   IR ANGIOGRAM VISCERAL SELECTIVE  03/01/2021   IR ANGIOGRAM VISCERAL SELECTIVE  03/08/2021   IR EMBO TUMOR ORGAN ISCHEMIA INFARCT INC GUIDE ROADMAPPING  03/08/2021   IR RADIOLOGIST EVAL & MGMT  02/06/2021   IR RADIOLOGIST EVAL & MGMT  05/31/2021   IR US GUIDE VASC ACCESS RIGHT  03/01/2021   IR US GUIDE VASC ACCESS RIGHT  03/08/2021   UPPER GASTROINTESTINAL ENDOSCOPY  03/2018   [place] --candidiasis plus esophageal varices grade 2.  Treated with fluconazole and status post esophageal banding.   US ECHOCARDIOGRAPHY  10/2016   per overseas records from [place] - LVEF 68%; mildly calcified aortic valve, normal function otherwise    I have reviewed the social history and family history with the patient and they are unchanged from previous note.  ALLERGIES:  is allergic to pork-derived products.  MEDICATIONS:  Current Outpatient Medications  Medication Sig Dispense Refill   acetaminophen (TYLENOL) 500 MG tablet Take 500 mg by mouth 2 (two) times daily as needed for mild pain.     amLODipine (NORVASC) 5 MG tablet Take 1 tablet (5 mg total) by mouth daily. 90 tablet 3    lactulose (CEPHULAC) 10 g packet Take 1 packet (10 g total) by mouth as needed (constipation). Dissolve in 8 ounces of water 30 each 1   omeprazole (PRILOSEC) 40 MG capsule Take 1 capsule (40 mg total) by mouth daily. 90 capsule 3   tamsulosin (FLOMAX) 0.4 MG CAPS capsule Take 1 capsule (0.4 mg total) by mouth daily. Please pack and mail to home 90 capsule 3   traMADol (ULTRAM) 50 MG tablet Take 1 tablet (50 mg total) by mouth every 12 (twelve) hours as needed for severe pain. 20 tablet 0   No current facility-administered medications for this visit.    PHYSICAL EXAMINATION: ECOG PERFORMANCE STATUS: 1 - Symptomatic but completely ambulatory  There were no vitals filed for this visit. Wt Readings from Last 3 Encounters:  07/24/21 139 lb 15.9 oz (63.5 kg)  07/20/21 140 lb (63.5 kg)  06/29/21 136 lb (61.7 kg)     GENERAL:alert, no distress and comfortable SKIN: skin color normal, no rashes or significant lesions EYES: normal, Conjunctiva are pink and non-injected, sclera clear  NEURO: alert & oriented x 3 with fluent speech  LABORATORY DATA:  I have reviewed the data as listed CBC Latest Ref Rng & Units 08/29/2021 07/24/2021  07/20/2021  WBC 4.0 - 10.5 K/uL 4.7 7.9 5.7  Hemoglobin 13.0 - 17.0 g/dL 13.0 14.0 13.3  Hematocrit 39.0 - 52.0 % 38.6(L) 41.5 39.3  Platelets 150 - 400 K/uL 134(L) 146(L) 144(L)     CMP Latest Ref Rng & Units 08/29/2021 07/24/2021 07/20/2021  Glucose 70 - 99 mg/dL 106(H) 107(H) 82  BUN 8 - 23 mg/dL 15 12 11   Creatinine 0.61 - 1.24 mg/dL 1.02 0.86 1.03  Sodium 135 - 145 mmol/L 139 139 138  Potassium 3.5 - 5.1 mmol/L 4.1 4.3 4.4  Chloride 98 - 111 mmol/L 104 106 105  CO2 22 - 32 mmol/L 27 26 26   Calcium 8.9 - 10.3 mg/dL 9.4 9.4 9.4  Total Protein 6.5 - 8.1 g/dL 9.0(H) 9.1(H) 9.3(H)  Total Bilirubin 0.3 - 1.2 mg/dL 0.8 1.0 0.7  Alkaline Phos 38 - 126 U/L 181(H) 152(H) 196(H)  AST 15 - 41 U/L 49(H) 33 46(H)  ALT 0 - 44 U/L 39 26 33      RADIOGRAPHIC  STUDIES: I have personally reviewed the radiological images as listed and agreed with the findings in the report. No results found.    No orders of the defined types were placed in this encounter.  All questions were answered. The patient knows to call the clinic with any problems, questions or concerns. No barriers to learning was detected. The total time spent in the appointment was 30 minutes.     Truitt Merle, MD 08/29/2021   I, Wilburn Mylar, am acting as scribe for Truitt Merle, MD.   I have reviewed the above documentation for accuracy and completeness, and I agree with the above.

## 2021-08-29 NOTE — Telephone Encounter (Signed)
Contacted patient with appointment reminder scheduled for today. Transportation assistance will be provided.Patient reports to be doing well and having regular bowel movements without complaints of constipation.I have advised patient to bring his prescribed medications to this appointment.   [name] Deago Burruss RN [facility] [place] [phone]

## 2021-08-29 NOTE — Progress Notes (Signed)
Per Dr. Burr Medico, ok to treat with urine protein 100 mg/dL.

## 2021-08-30 ENCOUNTER — Telehealth: Payer: Self-pay | Admitting: Hematology

## 2021-08-30 ENCOUNTER — Telehealth: Payer: Self-pay

## 2021-08-30 LAB — AFP TUMOR MARKER: AFP, Serum, Tumor Marker: 501 ng/mL — ABNORMAL HIGH (ref 0.0–8.4)

## 2021-08-30 NOTE — Telephone Encounter (Signed)
Scheduled per sch msg. Called and spoke with patient with swahili interpreter.. confirmed dates and times of appts

## 2021-08-30 NOTE — Telephone Encounter (Signed)
Transportation scheduled for 09/19/21 Pick up time 10:45 am.  [name] Momin Misko RN [facility] [place] [phone]

## 2021-08-31 ENCOUNTER — Telehealth: Payer: Self-pay

## 2021-08-31 NOTE — Telephone Encounter (Signed)
Patient called and reminded of today`s appoint,ment with Urology. Transportation scheduled.  [name] Emry Barbato RN [facility] [place] [phone]

## 2021-09-05 ENCOUNTER — Telehealth: Payer: Self-pay | Admitting: Family Medicine

## 2021-09-05 ENCOUNTER — Ambulatory Visit: Payer: Self-pay

## 2021-09-05 DIAGNOSIS — H1013 Acute atopic conjunctivitis, bilateral: Secondary | ICD-10-CM

## 2021-09-05 MED ORDER — OLOPATADINE HCL 0.1 % OP SOLN: 1.0000 [drp] | Freq: Two times a day (BID) | OPHTHALMIC | 12 refills | Status: DC

## 2021-09-05 NOTE — Chronic Care Management (AMB) (Signed)
Care Management  Collaboration  Note  09/05/2021 Name: Jaliel Deavers MRN: [id number] DOB: [birth year]  Laurens Matheny is a 75 y.o. year old male who is a primary care patient of [name], MD. The CCM team was consulted for Collaboration  reference  care coordination needs for lost Medicaid Card.    Assessment:  Dr. Owens Shark consulted RNCM and Honor Loh, RN, with [facility] for the patient regarding the loss of his Medicaid card. RNCM collaborated with [name], and she feels that it is just the card itself was lost.   Recommendation:  The best plan is to call the patient and speak with the family [name] will reach out to them) and help them talk with Social services to have the patient a duplicate card sent.   Intervention: Patient was not interviewed or contacted during this encounter.     Follow up Plan: No follow up scheduled with CCM team at this time  Collaboration with [name], MD regarding development and update of comprehensive plan of care as evidenced by provider attestation and co-signature Review of patient past medical history, allergies, medications, and health status, including review of pertinent consultant reports was performed as part of comprehensive evaluation and provision of care management/care coordination services.      [name] RN, BSN, [facility] Care Management Coordinator [place] Phone: [phone] Fax: [phone]

## 2021-09-05 NOTE — Telephone Encounter (Signed)
Called patient's wife to discuss results.  Patient also had some concerns.  He reports persistent eye drainage.  Previously referred to ophthalmology.  Sent in his eyedrops for allergic conjunctivitis and asked the pharmacy to mail to his house.  New referral for ophthalmology placed.  The patient is most concerned he lost his Medicaid card.  Will route to [facility]regational RN Honor Loh and RN care coordinator [name] for assistance.  All questions answered. He reports otherwise he is well, very grateful for Ms. [name]'s care. Swahili interpreter was used throughout encounter. [name], MD  Family Medicine Teaching Service

## 2021-09-12 ENCOUNTER — Telehealth: Payer: Self-pay

## 2021-09-12 NOTE — Telephone Encounter (Signed)
Patient has lost his medicaid card and requesting replacement. [employer] contacted. Patient added to the call and verbalized his request. New card will be mailed within 7-10 days. Current address confirmed.  [name] Oluwatosin Bracy RN [facility] [place] [phone]

## 2021-09-15 ENCOUNTER — Telehealth: Payer: Self-pay

## 2021-09-15 NOTE — Telephone Encounter (Signed)
09/14/21  2:14pm  Received a call from [facility] regarding u/sound scheduling with [place].[facility] has sent the request for u/sound to [facility] imaging and I will follow up on Monday 09/17/21 for appointment date and time.  [name] Cainan Trull RN [facility] [place] [phone]

## 2021-09-17 ENCOUNTER — Other Ambulatory Visit: Payer: Self-pay | Admitting: Urology

## 2021-09-17 ENCOUNTER — Telehealth: Payer: Self-pay

## 2021-09-17 NOTE — Telephone Encounter (Signed)
I have contacted [facility] Scheduling, appointment for ultrasound scheduled for 09/18/21 at 0900 am. Patient called and informed.Transportation has been scheduled as well with pick up time 08:20 am.  Honor Loh RN [facility] [place] Nurse [phone]

## 2021-09-18 ENCOUNTER — Other Ambulatory Visit: Payer: Self-pay

## 2021-09-18 ENCOUNTER — Ambulatory Visit (HOSPITAL_COMMUNITY)
Admission: RE | Admit: 2021-09-18 | Discharge: 2021-09-18 | Disposition: A | Discharge status: Home / Self Care | Payer: Medicaid Other | Source: Ambulatory Visit | Attending: Family Medicine | Admitting: Family Medicine

## 2021-09-18 DIAGNOSIS — N5082 Scrotal pain: Secondary | ICD-10-CM | POA: Diagnosis not present

## 2021-09-18 NOTE — Congregational Nurse Program (Signed)
  Dept: [place] Nurse Program Note  Date of Encounter: 09/18/2021  Past Medical History: Past Medical History:  Diagnosis Date   BPH (benign prostatic hyperplasia)    Cirrhosis ([place])    Hypertension    Splenomegaly     Encounter Details:  Transportation assistance provided to and from u/sound appointment today.I assisted with interpretation during registration prior to interpreter arrival.  [name] Kanasia Gayman RN [facility] [place] Nurse [phone]-office

## 2021-09-19 ENCOUNTER — Other Ambulatory Visit (HOSPITAL_COMMUNITY): Payer: Self-pay

## 2021-09-19 ENCOUNTER — Inpatient Hospital Stay (HOSPITAL_BASED_OUTPATIENT_CLINIC_OR_DEPARTMENT_OTHER): Payer: Medicaid Other | Admitting: Hematology

## 2021-09-19 ENCOUNTER — Inpatient Hospital Stay: Payer: Medicaid Other

## 2021-09-19 ENCOUNTER — Encounter: Payer: Self-pay | Admitting: Hematology

## 2021-09-19 ENCOUNTER — Other Ambulatory Visit: Payer: Self-pay | Admitting: Hematology

## 2021-09-19 ENCOUNTER — Other Ambulatory Visit: Payer: Self-pay

## 2021-09-19 VITALS — BP 138/76 | HR 75 | Temp 98.6°F | Resp 18 | Ht 66.0 in | Wt 136.0 lb

## 2021-09-19 DIAGNOSIS — C220 Liver cell carcinoma: Secondary | ICD-10-CM

## 2021-09-19 DIAGNOSIS — Z5112 Encounter for antineoplastic immunotherapy: Secondary | ICD-10-CM | POA: Diagnosis not present

## 2021-09-19 LAB — URINALYSIS, ROUTINE W REFLEX MICROSCOPIC
Bilirubin Urine: NEGATIVE
Glucose, UA: NEGATIVE mg/dL
Ketones, ur: NEGATIVE mg/dL
Nitrite: POSITIVE — AB
Protein, ur: 30 mg/dL — AB
Specific Gravity, Urine: 1.016 (ref 1.005–1.030)
WBC, UA: >50 WBC/hpf — ABNORMAL HIGH (ref 0–5)
pH: 7 (ref 5.0–8.0)

## 2021-09-19 LAB — CBC WITH DIFFERENTIAL (CANCER CENTER ONLY)
Abs Immature Granulocytes: 0.01 10*3/uL (ref 0.00–0.07)
Basophils Absolute: 0 10*3/uL (ref 0.0–0.1)
Basophils Relative: 0 %
Eosinophils Absolute: 0.3 10*3/uL (ref 0.0–0.5)
Eosinophils Relative: 6 %
HCT: 38 % — ABNORMAL LOW (ref 39.0–52.0)
Hemoglobin: 12.9 g/dL — ABNORMAL LOW (ref 13.0–17.0)
Immature Granulocytes: 0 %
Lymphocytes Relative: 25 %
Lymphs Abs: 1.1 10*3/uL (ref 0.7–4.0)
MCH: 33.5 pg (ref 26.0–34.0)
MCHC: 33.9 g/dL (ref 30.0–36.0)
MCV: 98.7 fL (ref 80.0–100.0)
Monocytes Absolute: 0.7 10*3/uL (ref 0.1–1.0)
Monocytes Relative: 15 %
Neutro Abs: 2.4 10*3/uL (ref 1.7–7.7)
Neutrophils Relative %: 54 %
Platelet Count: 185 10*3/uL (ref 150–400)
RBC: 3.85 MIL/uL — ABNORMAL LOW (ref 4.22–5.81)
RDW: 13.2 % (ref 11.5–15.5)
WBC Count: 4.5 10*3/uL (ref 4.0–10.5)
nRBC: 0 % (ref 0.0–0.2)

## 2021-09-19 LAB — PROTIME-INR
INR: 1.2 (ref 0.8–1.2)
Prothrombin Time: 14.9 seconds (ref 11.4–15.2)

## 2021-09-19 LAB — CMP (CANCER CENTER ONLY)
ALT: 41 U/L (ref 0–44)
AST: 56 U/L — ABNORMAL HIGH (ref 15–41)
Albumin: 3.1 g/dL — ABNORMAL LOW (ref 3.5–5.0)
Alkaline Phosphatase: 203 U/L — ABNORMAL HIGH (ref 38–126)
Anion gap: 7 (ref 5–15)
BUN: 18 mg/dL (ref 8–23)
CO2: 23 mmol/L (ref 22–32)
Calcium: 9.4 mg/dL (ref 8.9–10.3)
Chloride: 108 mmol/L (ref 98–111)
Creatinine: 0.84 mg/dL (ref 0.61–1.24)
GFR, Estimated: >60 mL/min (ref >60)
Glucose, Bld: 132 mg/dL — ABNORMAL HIGH (ref 70–99)
Potassium: 4 mmol/L (ref 3.5–5.1)
Sodium: 138 mmol/L (ref 135–145)
Total Bilirubin: 0.5 mg/dL (ref 0.3–1.2)
Total Protein: 8.7 g/dL — ABNORMAL HIGH (ref 6.5–8.1)

## 2021-09-19 LAB — TOTAL PROTEIN, URINE DIPSTICK: Protein, ur: 30 mg/dL — AB

## 2021-09-19 MED ORDER — CIPROFLOXACIN HCL 500 MG PO TABS
500.0000 mg | ORAL_TABLET | Freq: Two times a day (BID) | ORAL | 0 refills | Status: AC
  Filled 2021-09-19: qty 10, 5d supply, fill #0

## 2021-09-19 MED ORDER — SODIUM CHLORIDE 0.9 % IV SOLN
1200.0000 mg | Freq: Once | INTRAVENOUS | Status: AC
  Administered 2021-09-19: 1200 mg via INTRAVENOUS
  Filled 2021-09-19: qty 20

## 2021-09-19 MED ORDER — SODIUM CHLORIDE 0.9 % IV SOLN
15.0000 mg/kg | Freq: Once | INTRAVENOUS | Status: AC
  Administered 2021-09-19: 1000 mg via INTRAVENOUS
  Filled 2021-09-19: qty 32

## 2021-09-19 MED ORDER — SODIUM CHLORIDE 0.9% FLUSH
10.0000 mL | INTRAVENOUS | Status: AC | PRN
  Administered 2021-09-19: 10 mL

## 2021-09-19 MED ORDER — SODIUM CHLORIDE 0.9 % IV SOLN: Freq: Once | INTRAVENOUS | Status: AC

## 2021-09-19 MED ORDER — CIPROFLOXACIN HCL 500 MG PO TABS: 500.0000 mg | ORAL_TABLET | Freq: Two times a day (BID) | ORAL | 0 refills | Status: DC

## 2021-09-19 MED ORDER — CIPROFLOXACIN HCL 500 MG PO TABS
500.0000 mg | ORAL_TABLET | Freq: Two times a day (BID) | ORAL | 0 refills | Status: DC
  Filled 2021-09-19: qty 10, 5d supply, fill #0

## 2021-09-19 NOTE — Progress Notes (Signed)
Jim Little   [phone] [phone]   Clinic Follow up Note   Patient Care Team: [name], MD as PCP - General (Family Medicine) Jonnie Finner, RN (Inactive) as Oncology Nurse Navigator Heilingoetter, Tobe Sos, PA-C as Physician Assistant (Physician Assistant) Truitt Merle, MD as Consulting Physician (Oncology)  Date of Service:  09/19/2021  CHIEF COMPLAINT: f/u of [facility]  CURRENT THERAPY:  Tecentriq and Avastin, q3weeks, starting 06/29/21  ASSESSMENT & PLAN:  Jim Little is a 75 y.o. male with   1. Hepatocellular Carcinoma, unresectable, stage IIIA, with node metastasis  -Based on MRI liver 01/12/21 with Advanced cirrhotic changes involving the liver and Large infiltrating mass versus numerous confluent masses involving the right hepatic lobe. This is consistent with hepatocellular carcinoma. Associated thrombosed middle and portal veins. No definite left hepatic lobe lesions. AFP (01/14/21) 11,904.0. -His 01/15/21 CT of the chest did not show metastatic disease to the lungs. Staging workup completed. -He has unclear etiology of cirrhosis, which is followed by Dr. Therisa Doyne. Hep B surface antigen non reactive (05/15/20), ANA negative, Hep C ab non reactive. No alcohol abuse. -He is s/p Y 90 radioembolization on 03/08/21. His AFP has dropped significantly after Y90 -MRI scan from 05/29/2021 showed good response in liver, but he developed hepatoduodenal ligament lymph node measuring 2.0 x 1.7 cm, likely metastatic disease   -given his extrahepatic metastasis, I recommend systemic treatment Tecentriq and Avastin. Pt has not been compliant with oral medicine, oral TKI will be challenge for him.  -He had a EGD in 07/2020, showed no significant varices, okay to proceed Avastin -He started Tecentriq and Avastin on 06/29/21. He has tolerated relatively well, with constipation. -The CT scan from 07/24/21 during his ED visit showed significant improvement of his liver  cancer and lymph node, no new lesions.  He has had a good response to treatment so far -He is clinically stable, lab reviewed, adequate for treatment, will proceed cycle 4 today   2. Constipation -improved -He has Dulcolax, MiraLAX, and lactulose   3. Scrotal swelling, UTI  -developed following Y90 treatment -he is followed by [facility] for history of urinary retention and brought this to Dr. Keane Scrape attention at his last visit on 04/30/21.  -his most recent scrotal US from yesterday, 09/18/21, showed bilateral epididymitis and extensive microlithiasis - -he complains of dysuria, UA today was positive, I called in Cipro to pharmacy today.   4. Social Support, Language barrier -From the [place], was at a refugee camp in [place] -Has several family members living in single house. 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 Neither he nor his family speak [place]  -He speaks Swahili. He has [facility], Honor Loh, who helps him. Her contact is [phone]   5. Portal vein thrombosis, Cirrhosis  -Seen with direct admission from 01/13/21-01/16/21 -Patient received heparin drip inpatient. Dr. Marin Olp did not recommend anticoagulation since the thrombus is tumor thrombus. -His liver cirrhosis is followed by Dr. Therisa Doyne from Gastroenterology. Prior work up in 2021 with EGD/Colonoscopy 07/2020 which showed no varices      PLAN: -proceed with C4 Tecentriq and Avastin today -labs, f/u, and C5 in 3 weeks -I spoke with [name] and she will help to pick up Cipro for him and will arrange his urology f/u    No problem-specific Assessment & Plan notes found for this encounter.   SUMMARY OF ONCOLOGIC HISTORY: Oncology History Overview Note  Cancer Staging Hepatocellular carcinoma [facility]) Staging form: Liver, AJCC 8th Edition - Clinical stage from 02/09/2021: Stage IIIA (cT3, cN0, cM0) - Signed by Truitt Merle, MD on  02/09/2021 Stage prefix: Initial diagnosis    Hepatocellular carcinoma ([place])  05/12/2020 Imaging   MRI Liver  IMPRESSION: 1. Geographic lesion in the inferior medial aspect of the RIGHT hepatic lobe has some imaging features suggesting focal fatty infiltration. However, atypical enhancement pattern. Recommend follow-up contrast MRI in 3 to 6 months to re-evaluate. 2. No additional lesion liver. 3. Morphologic changes consistent with cirrhosis.   01/12/2021 Imaging   MRI Abdomen  IMPRESSION: 1. Advanced cirrhotic changes involving the liver. There is a large infiltrating mass versus numerous confluent masses involving the right hepatic lobe and consistent with hepatocellular carcinoma. Associated thrombosed middle and portal veins. No definite left hepatic lobe lesions. LI-RADS 5. 2. Borderline splenomegaly. 3. No ascites or abdominal wall hernia.     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 The ultrasound correlate of the previously demonstrated right liver tumor is heterogeneous tissue with ill-defined margin.   In addition to the right-sided tumor, there are several small hyperechoic foci of approximately 1 cm, potentially additional [place] foci versus regenerative nodules.   Cirrhosis and splenomegaly   01/15/2021 Imaging   CT Chest  IMPRESSION: 1. No evidence of metastatic disease in the chest. 2. Cirrhotic morphology of the liver with ill-defined, heterogeneous mass of the right lobe of the liver and occlusion of the right portal vein, in keeping with known hepatocellular carcinoma, better demonstrated by recent MR.   02/09/2021 Initial Diagnosis   Hepatocellular carcinoma ([place])   02/09/2021 Cancer Staging   Staging form: Liver, AJCC 8th Edition - Clinical stage from 02/09/2021:  Stage IIIA (cT3, cN0, cM0) - Signed by Truitt Merle, MD on 02/09/2021 Stage prefix: Initial diagnosis    05/29/2021 Imaging   MRI Abdomen  IMPRESSION: 1. Interval decrease in size of the infiltrating mass in the right hepatic lobe showing heterogeneous arterial phase hyperenhancement. Persistent arterial phase enhancement can be seen early post y 22 embolization. No substantial restricted diffusion in the lesion on today's study. 2. Tiny focus of arterial phase hyperenhancement in the dome of the liver near the junction of segment IV and VIII. Unclear whether this was present previously given the marked motion degradation on the earlier study. Close attention on follow-up recommended. 3. 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 Subtraction postcontrast imaging suggests that there may be some enhancement in the thrombus suggesting tumor thrombus or a combination of tumor and bland thrombus. 4. Interval increase in size of the hepatoduodenal ligament lymph node measuring 2.0 x 1.7 cm. Likely metastatic disease,  continued close attention on follow-up recommended. 5. Markedly distended urinary bladder.   06/29/2021 -  Chemotherapy   Patient is on Treatment Plan : LUNG Atezolizumab + Bevacizumab q21d Maintenance        INTERVAL HISTORY:  Jim Little is here for a follow up of [place]. He was last seen by me on 08/29/21. He presents to the clinic accompanied by an interpreter. He reports he is feeling better today. He reports penile pain with urination.   All other systems were  reviewed with the patient and are negative.  MEDICAL HISTORY:  Past Medical History:  Diagnosis Date   BPH (benign prostatic hyperplasia)    Cirrhosis ([place])    Hypertension    Splenomegaly     SURGICAL HISTORY: Past Surgical History:  Procedure Laterality Date   BIOPSY   08/22/2020   Procedure: BIOPSY;  Surgeon: Ronnette Juniper, MD;  Location: [facility];  Service: Gastroenterology;;   COLONOSCOPY WITH PROPOFOL N/A 08/22/2020   Procedure: COLONOSCOPY WITH PROPOFOL;  Surgeon: Ronnette Juniper, MD;  Location: [facility];  Service: Gastroenterology;  Laterality: N/A;   ESOPHAGOGASTRODUODENOSCOPY (EGD) WITH PROPOFOL N/A 08/22/2020   Procedure: ESOPHAGOGASTRODUODENOSCOPY (EGD) WITH PROPOFOL;  Surgeon: Ronnette Juniper, MD;  Location: [facility];  Service: Gastroenterology;  Laterality: N/A;   IR ANGIOGRAM SELECTIVE EACH ADDITIONAL VESSEL  03/01/2021   IR ANGIOGRAM SELECTIVE EACH ADDITIONAL VESSEL  03/08/2021   IR ANGIOGRAM VISCERAL SELECTIVE  03/01/2021   IR ANGIOGRAM VISCERAL SELECTIVE  03/01/2021   IR ANGIOGRAM VISCERAL SELECTIVE  03/08/2021   IR EMBO TUMOR ORGAN ISCHEMIA INFARCT INC GUIDE ROADMAPPING  03/08/2021   IR RADIOLOGIST EVAL & MGMT  02/06/2021   IR RADIOLOGIST EVAL & MGMT  05/31/2021   IR US GUIDE VASC ACCESS RIGHT  03/01/2021   IR US GUIDE VASC ACCESS RIGHT  03/08/2021   UPPER GASTROINTESTINAL ENDOSCOPY  03/2018   [place] --candidiasis plus esophageal varices grade 2.  Treated with fluconazole and status post esophageal banding.   US ECHOCARDIOGRAPHY  10/2016   per overseas records from [place] - LVEF 68%; mildly calcified aortic valve, normal function otherwise    I have reviewed the social history and family history with the patient and they are unchanged from previous note.  ALLERGIES:  is allergic to pork-derived products.  MEDICATIONS:  Current Outpatient Medications  Medication Sig Dispense Refill   acetaminophen (TYLENOL) 500 MG tablet Take 500 mg by mouth 2 (two) times daily as needed for mild pain.     amLODipine (NORVASC) 5 MG tablet Take 1 tablet (5 mg total) by mouth daily. 90 tablet 3   ciprofloxacin (CIPRO) 500 MG tablet Take 1 tablet (500 mg total) by mouth 2 (two) times daily for 10 doses. 10 tablet 0   lactulose (CEPHULAC) 10 g packet Take  1 packet (10 g total) by mouth as needed (constipation). Dissolve in 8 ounces of water 30 each 1   olopatadine (PATANOL) 0.1 % ophthalmic solution Place 1 drop into both eyes 2 (two) times daily. 5 mL 12   omeprazole (PRILOSEC) 40 MG capsule Take 1 capsule (40 mg total) by mouth daily. 90 capsule 3   tamsulosin (FLOMAX) 0.4 MG CAPS capsule Take 1 capsule (0.4 mg total) by mouth daily. Please pack and mail to home 90 capsule 3   traMADol (ULTRAM) 50 MG tablet Take 1 tablet (50 mg total) by mouth every 12 (twelve) hours as needed for severe pain. 20 tablet 0   No current facility-administered medications for this visit.   Facility-Administered Medications Ordered in Other Visits  Medication Dose Route Frequency Provider Last Rate Last Admin   sodium chloride flush (NS) 0.9 % injection 10 mL  10 mL Intracatheter PRN Truitt Merle, MD   10 mL at 09/19/21 1604    PHYSICAL EXAMINATION: ECOG PERFORMANCE STATUS: 2 - Symptomatic, <50% confined to bed  Vitals:   09/19/21 1141  BP: 138/76  Pulse: 75  Resp: 18  Temp: 98.6 F (37 C)  SpO2: 100%   Wt Readings from  Last 3 Encounters:  09/19/21 61.7 kg  07/24/21 63.5 kg  07/20/21 63.5 kg     GENERAL:alert, no distress and comfortable SKIN: skin color, texture, turgor are normal, no rashes or significant lesions EYES: normal, Conjunctiva are pink and non-injected, sclera clear  LUNGS: clear to auscultation and percussion with normal breathing effort HEART: regular rate & rhythm and no murmurs and no lower extremity edema ABDOMEN:abdomen soft, non-tender and normal bowel sounds Musculoskeletal:no cyanosis of digits and no clubbing  NEURO: alert & oriented x 3 with fluent speech, no focal motor/sensory deficits  LABORATORY DATA:  I have reviewed the data as listed CBC Latest Ref Rng & Units 09/19/2021 08/29/2021 07/24/2021  WBC 4.0 - 10.5 K/uL 4.5 4.7 7.9  Hemoglobin 13.0 - 17.0 g/dL 12.9(L) 13.0 14.0  Hematocrit 39.0 - 52.0 % 38.0(L) 38.6(L)  41.5  Platelets 150 - 400 K/uL 185 134(L) 146(L)     CMP Latest Ref Rng & Units 09/19/2021 08/29/2021 07/24/2021  Glucose 70 - 99 mg/dL 132(H) 106(H) 107(H)  BUN 8 - 23 mg/dL 18 15 12   Creatinine 0.61 - 1.24 mg/dL 0.84 1.02 0.86  Sodium 135 - 145 mmol/L 138 139 139  Potassium 3.5 - 5.1 mmol/L 4.0 4.1 4.3  Chloride 98 - 111 mmol/L 108 104 106  CO2 22 - 32 mmol/L 23 27 26   Calcium 8.9 - 10.3 mg/dL 9.4 9.4 9.4  Total Protein 6.5 - 8.1 g/dL 8.7(H) 9.0(H) 9.1(H)  Total Bilirubin 0.3 - 1.2 mg/dL 0.5 0.8 1.0  Alkaline Phos 38 - 126 U/L 203(H) 181(H) 152(H)  AST 15 - 41 U/L 56(H) 49(H) 33  ALT 0 - 44 U/L 41 39 26      RADIOGRAPHIC STUDIES: I have personally reviewed the radiological images as listed and agreed with the findings in the report. US SCROTUM W/DOPPLER  Result Date: 09/18/2021 CLINICAL DATA:  Bilateral scrotal pain EXAM: SCROTAL ULTRASOUND DOPPLER ULTRASOUND OF THE TESTICLES TECHNIQUE: Complete ultrasound examination of the testicles, epididymis, and other scrotal structures was performed. Color and spectral Doppler ultrasound were also utilized to evaluate blood flow to the testicles. COMPARISON:  None. FINDINGS: Right testicle Measurements: 28 x 24 x 29 mm. Extensive microlithiasis. No focal mass. Left testicle Measurements: 34 x 21 x 24 mm. Extensive microlithiasis. No focal mass. Right epididymis: Thickened although not particularly hypervascular. Left epididymis:  Thickened and hypervascular. Hydrocele:  Small right hydrocele which is simple. Varicocele:  None visualized. Pulsed Doppler interrogation of both testes demonstrates normal low resistance arterial waveforms bilaterally. IMPRESSION: 1. Bilateral epididymitis. 2. Extensive microlithiasis. Electronically Signed   By: Jorje Guild M.D.   On: 09/18/2021 10:41      Orders Placed This Encounter  Procedures   AFP tumor marker    Standing Status:   Standing    Number of Occurrences:   20    Standing Expiration Date:    09/19/2022   Total Protein, Urine dipstick    Standing Status:   Standing    Number of Occurrences:   50    Standing Expiration Date:   09/19/2022   All questions were answered. The patient knows to call the clinic with any problems, questions or concerns. No barriers to learning was detected. The total time spent in the appointment was 30 minutes.     Truitt Merle, MD 09/19/2021   I, Wilburn Mylar, am acting as scribe for Truitt Merle, MD.   I have reviewed the above documentation for accuracy and completeness, and I agree with the  above.     

## 2021-09-19 NOTE — Congregational Nurse Program (Signed)
  Dept: [facility] Nurse Program Note  Date of Encounter: 09/19/2021  Past Medical History: Past Medical History:  Diagnosis Date   BPH (benign prostatic hyperplasia)    Cirrhosis ([place])    Hypertension    Splenomegaly     Encounter Details:  09/19/21- 6pm Ciprofloxacin picked up from [facility] and delivered to patient home. Patient wife instructed on how to administer the medication and she verbalized understanding   [name] Trinity Hyland RN [facility] [place] Nurse [phone]

## 2021-09-19 NOTE — Addendum Note (Signed)
Addended by: Oley Balm T on: 09/19/2021 04:18 PM   Modules accepted: Orders

## 2021-09-24 ENCOUNTER — Other Ambulatory Visit: Payer: Self-pay | Admitting: Hematology

## 2021-09-24 IMAGING — CT CT ABD-PELV W/ CM
2 of 5 series · 15 of 46 positions shown, 17 images · IV contrast (omnipaque)
Comparison: March 06, 2021.

CLINICAL DATA: Acute generalized abdominal pain. History of
hepatocellular carcinoma.

EXAM:
CT ABDOMEN AND PELVIS WITH CONTRAST
TECHNIQUE: Multidetector CT imaging of the abdomen and pelvis was performed
using the standard protocol following bolus administration of
intravenous contrast.
CONTRAST:  80mL OMNIPAQUE IOHEXOL 350 MG/ML SOLN

[Series 2: axial st · axial · 0.72mm/px · z∈[+1050,+1450]mm · 12 of 94 slices shown, 14 images]
[im 7/94  soft-tissue]
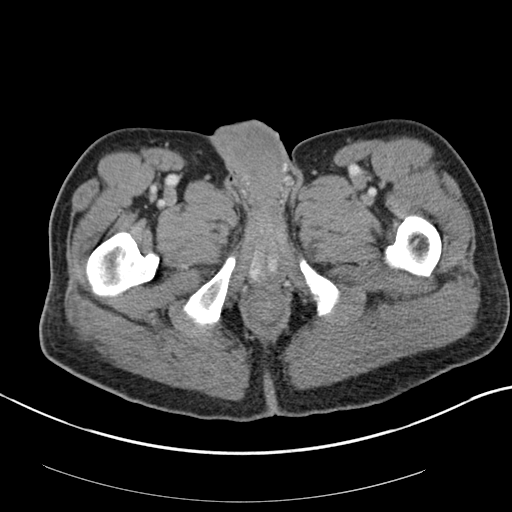
[im 7/94  bone]
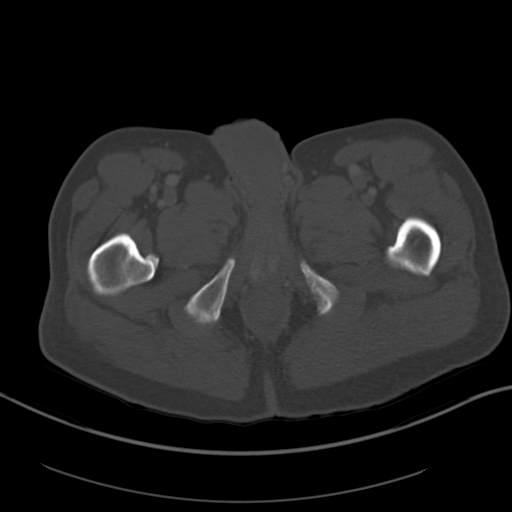
[im 13/94  soft-tissue]
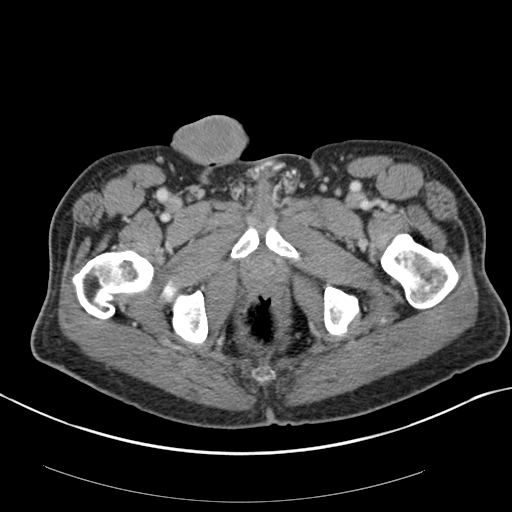
[im 19/94  soft-tissue]
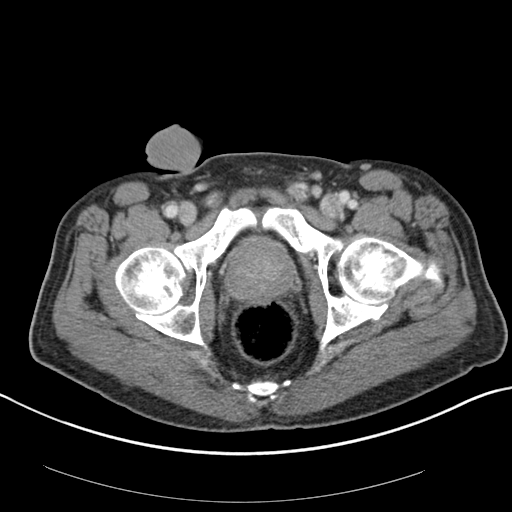
[im 32/94  soft-tissue]
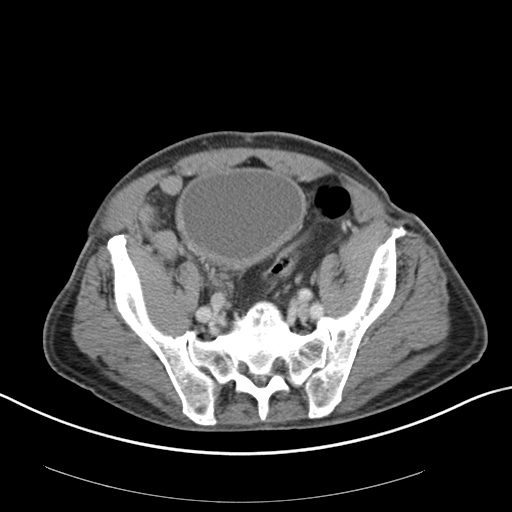
[im 38/94  soft-tissue]
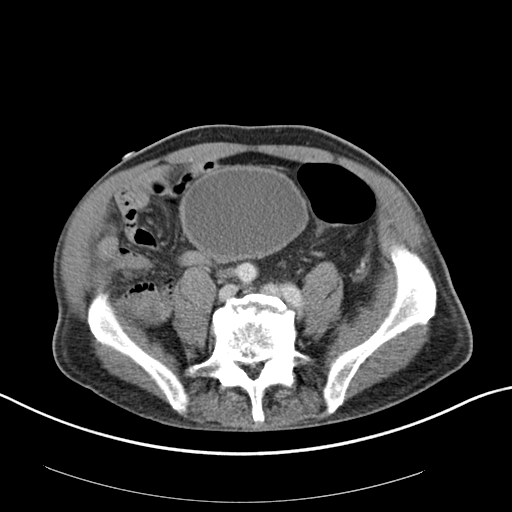
[im 44/94  soft-tissue]
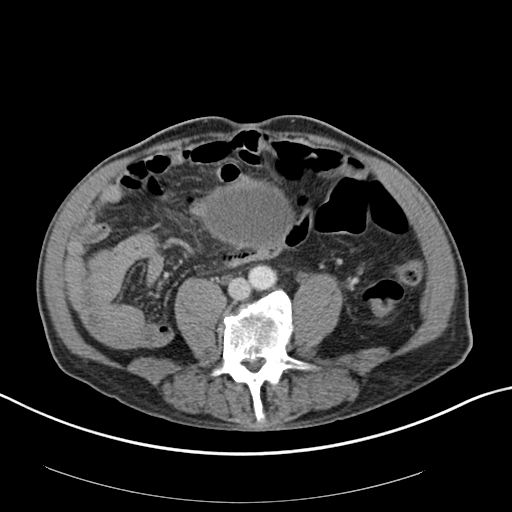
[im 50/94  soft-tissue]
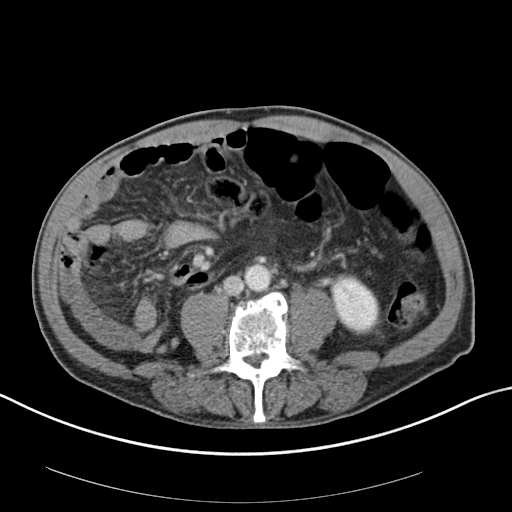
[im 56/94  soft-tissue]
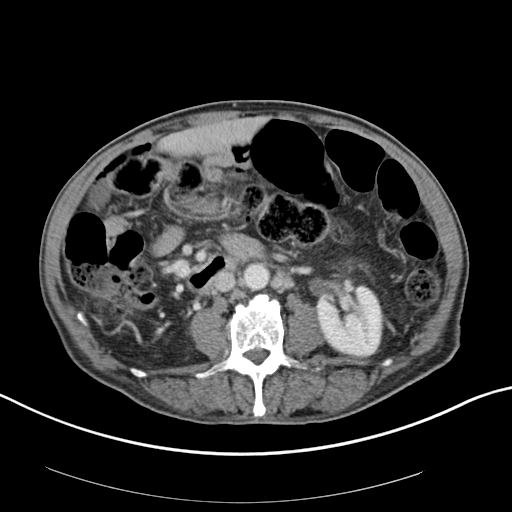
[im 63/94  soft-tissue]
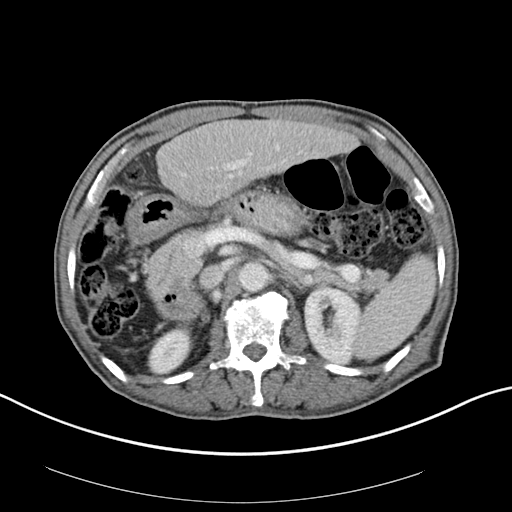
[im 63/94  bone]
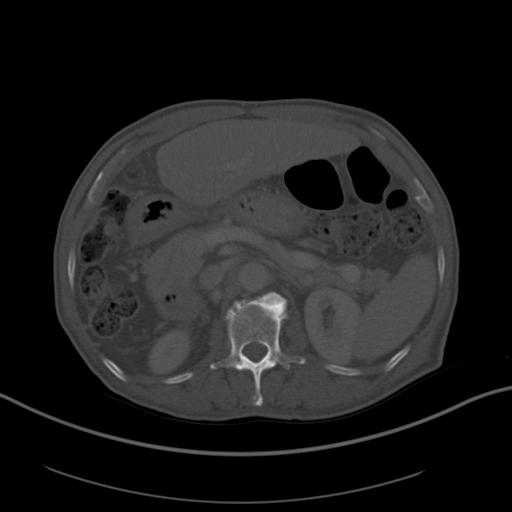
[im 75/94  soft-tissue]
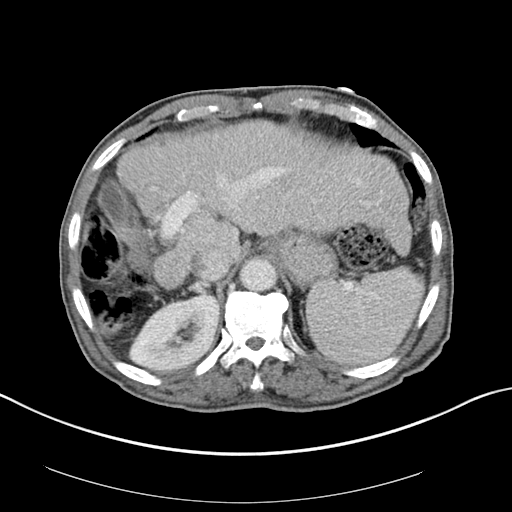
[im 81/94  soft-tissue]
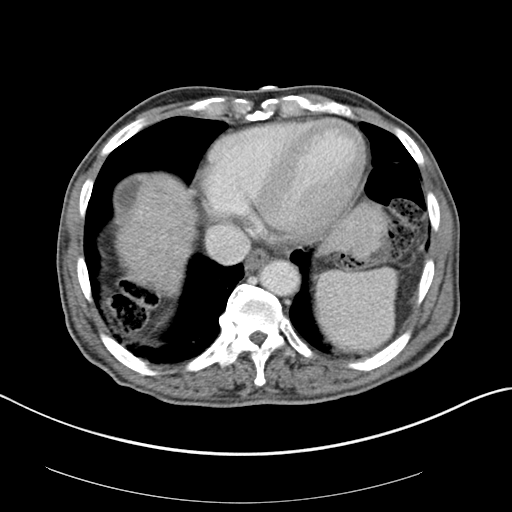
[im 87/94  soft-tissue]
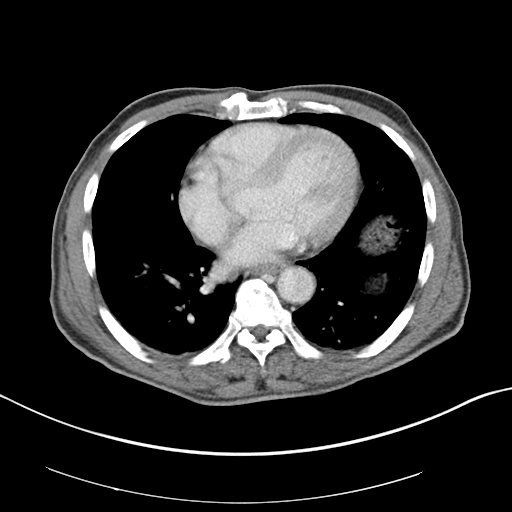

[Series 5: coronal st · coronal · 0.66mm/px · 3 of 130 slices shown]
[im 44/130  soft-tissue]
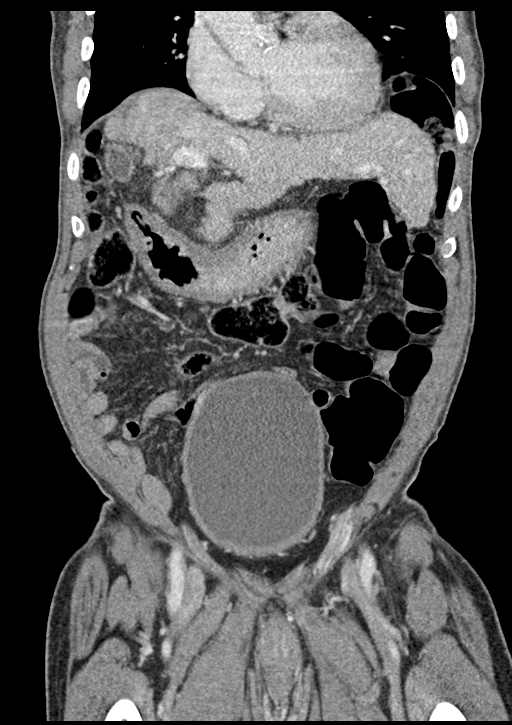
[im 58/130  soft-tissue]
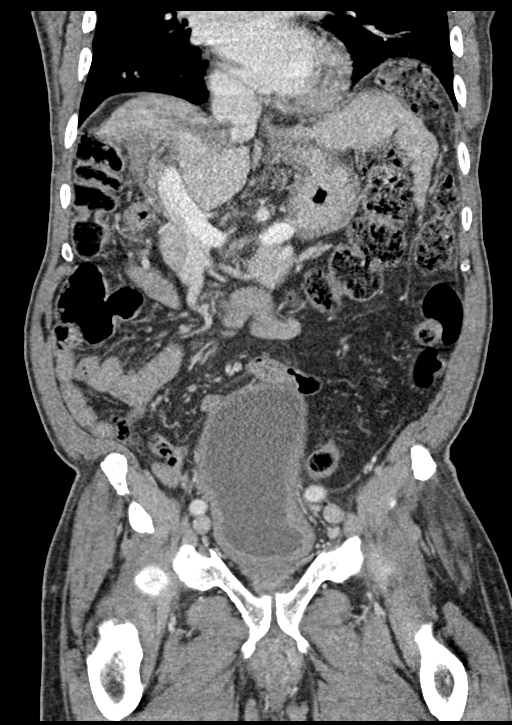
[im 72/130  soft-tissue]
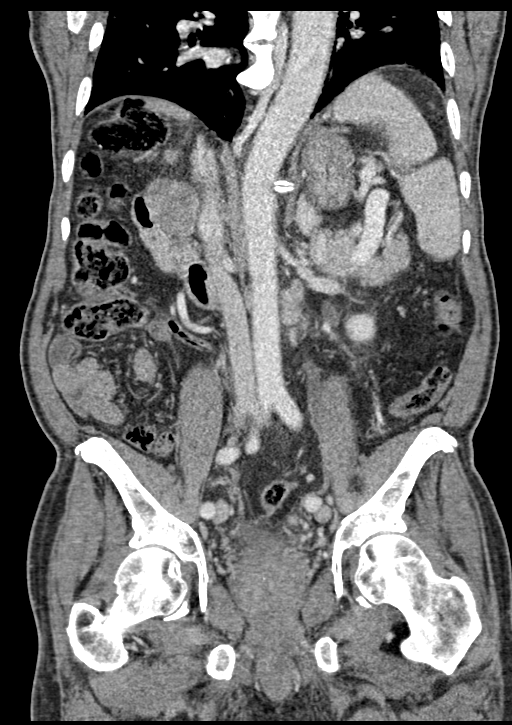

[15 of 46 positions shown; findings below may reference images not displayed]

FINDINGS: Lower chest: No acute abnormality.

Hepatobiliary: Patient is status post embolization of right hepatic
lobe and right hepatic mass. The mass is significantly smaller in
size measuring 3.9 x 2.6 cm. There remains tumor thrombus extending
from the right hepatic vein into the IVC. Nodular hepatic contours
are noted consistent with hepatic cirrhosis. There is noted
continued thrombus of the right portal vein consistent with tumor
thrombus. No gallstones or biliary dilatation is noted.

Pancreas: Unremarkable. No pancreatic ductal dilatation or
surrounding inflammatory changes.

Spleen: Normal in size without focal abnormality.

Adrenals/Urinary Tract: Adrenal glands and kidneys appear normal. No
hydronephrosis or renal obstruction is noted. No renal or ureteral
calculi are noted. Mild urinary bladder wall thickening is noted
which may represent cystitis.

Stomach/Bowel: Stomach is within normal limits. Appendix appears
normal. No evidence of bowel wall thickening, distention, or
inflammatory changes.

Vascular/Lymphatic: 12 x 6 mm lymph node is noted in porta hepatis
region. Abdominal aorta is unremarkable. Stable left periaortic
lymph node is noted measuring 16 x 17 mm.

Reproductive: Mild prostatic enlargement is noted.

Other: No abdominal wall hernia or abnormality. No abdominopelvic
ascites.

Musculoskeletal: No acute or significant osseous findings.
IMPRESSION: Status post embolization of right hepatic lobe and right hepatic
mass consistent with hepatocellular carcinoma. There is significant
atrophy of the right hepatic lobe as result, and the mass now
measures 3.9 x 2.6 cm which is significantly decreased compared to
prior exam. There continues to remain tumor thrombus extending from
right hepatic vein into the IVC. Thrombosis of the right portal vein
is also noted which may represent tumor thrombus. Nodular hepatic
contours are again noted consistent with hepatic cirrhosis.

12 x 6 mm lymph node is noted in porta hepatis region which was
present on prior exam and is of uncertain significance. Also noted
is 17 x 16 mm left periaortic lymph node which was present on prior
exam and is also of uncertain significance.

Mild urinary bladder wall thickening is noted which may represent
cystitis.

Mild prostatic enlargement is noted.

## 2021-10-03 ENCOUNTER — Telehealth: Payer: Self-pay

## 2021-10-03 NOTE — Telephone Encounter (Signed)
Transportation assistance provided for appointment on November 16th Pick up time 1100 am. Patient made aware.  [name] Jensyn Shave RN [facility] [place] [phone]

## 2021-10-09 ENCOUNTER — Telehealth: Payer: Self-pay

## 2021-10-09 NOTE — Telephone Encounter (Signed)
Patient contacted with appointment reminder. Transportation is already set up with [employer].  [name] Autumn Gunn RN [facility] [place] [phone]

## 2021-10-10 ENCOUNTER — Inpatient Hospital Stay: Payer: Medicaid Other | Attending: Hematology | Admitting: Hematology

## 2021-10-10 ENCOUNTER — Inpatient Hospital Stay: Payer: Medicaid Other

## 2021-10-10 ENCOUNTER — Encounter: Payer: Self-pay | Admitting: Hematology

## 2021-10-10 ENCOUNTER — Other Ambulatory Visit (HOSPITAL_COMMUNITY): Payer: Self-pay

## 2021-10-10 VITALS — BP 167/81 | HR 73 | Temp 98.3°F | Resp 18 | Ht 66.0 in | Wt 142.6 lb

## 2021-10-10 VITALS — BP 132/76

## 2021-10-10 DIAGNOSIS — Z5112 Encounter for antineoplastic immunotherapy: Secondary | ICD-10-CM | POA: Diagnosis not present

## 2021-10-10 DIAGNOSIS — C220 Liver cell carcinoma: Secondary | ICD-10-CM | POA: Insufficient documentation

## 2021-10-10 DIAGNOSIS — I10 Essential (primary) hypertension: Secondary | ICD-10-CM

## 2021-10-10 LAB — CBC WITH DIFFERENTIAL (CANCER CENTER ONLY)
Abs Immature Granulocytes: 0.01 10*3/uL (ref 0.00–0.07)
Basophils Absolute: 0 10*3/uL (ref 0.0–0.1)
Basophils Relative: 0 %
Eosinophils Absolute: 0.2 10*3/uL (ref 0.0–0.5)
Eosinophils Relative: 3 %
HCT: 39.6 % (ref 39.0–52.0)
Hemoglobin: 13.6 g/dL (ref 13.0–17.0)
Immature Granulocytes: 0 %
Lymphocytes Relative: 19 %
Lymphs Abs: 1 10*3/uL (ref 0.7–4.0)
MCH: 33.5 pg (ref 26.0–34.0)
MCHC: 34.3 g/dL (ref 30.0–36.0)
MCV: 97.5 fL (ref 80.0–100.0)
Monocytes Absolute: 0.5 10*3/uL (ref 0.1–1.0)
Monocytes Relative: 9 %
Neutro Abs: 3.6 10*3/uL (ref 1.7–7.7)
Neutrophils Relative %: 69 %
Platelet Count: 141 10*3/uL — ABNORMAL LOW (ref 150–400)
RBC: 4.06 MIL/uL — ABNORMAL LOW (ref 4.22–5.81)
RDW: 13.6 % (ref 11.5–15.5)
WBC Count: 5.4 10*3/uL (ref 4.0–10.5)
nRBC: 0 % (ref 0.0–0.2)

## 2021-10-10 LAB — CMP (CANCER CENTER ONLY)
ALT: 31 U/L (ref 0–44)
AST: 44 U/L — ABNORMAL HIGH (ref 15–41)
Albumin: 3.5 g/dL (ref 3.5–5.0)
Alkaline Phosphatase: 153 U/L — ABNORMAL HIGH (ref 38–126)
Anion gap: 7 (ref 5–15)
BUN: 11 mg/dL (ref 8–23)
CO2: 24 mmol/L (ref 22–32)
Calcium: 8.9 mg/dL (ref 8.9–10.3)
Chloride: 106 mmol/L (ref 98–111)
Creatinine: 0.93 mg/dL (ref 0.61–1.24)
GFR, Estimated: >60 mL/min (ref >60)
Glucose, Bld: 134 mg/dL — ABNORMAL HIGH (ref 70–99)
Potassium: 4.1 mmol/L (ref 3.5–5.1)
Sodium: 137 mmol/L (ref 135–145)
Total Bilirubin: 0.6 mg/dL (ref 0.3–1.2)
Total Protein: 8.7 g/dL — ABNORMAL HIGH (ref 6.5–8.1)

## 2021-10-10 LAB — TSH: TSH: 46.389 u[IU]/mL — ABNORMAL HIGH (ref 0.320–4.118)

## 2021-10-10 MED ORDER — LACTULOSE 10 G PO PACK
PACK | ORAL | 1 refills | Status: DC
  Filled 2021-10-10: qty 30, 30d supply, fill #0

## 2021-10-10 MED ORDER — CLONIDINE HCL 0.1 MG PO TABS: 0.2000 mg | ORAL_TABLET | ORAL | Status: DC | PRN

## 2021-10-10 MED ORDER — SODIUM CHLORIDE 0.9 % IV SOLN: Freq: Once | INTRAVENOUS | Status: AC

## 2021-10-10 MED ORDER — SODIUM CHLORIDE 0.9 % IV SOLN
15.0000 mg/kg | Freq: Once | INTRAVENOUS | Status: AC
  Administered 2021-10-10: 1000 mg via INTRAVENOUS
  Filled 2021-10-10: qty 32

## 2021-10-10 MED ORDER — SODIUM CHLORIDE 0.9 % IV SOLN
1200.0000 mg | Freq: Once | INTRAVENOUS | Status: AC
  Administered 2021-10-10: 1200 mg via INTRAVENOUS
  Filled 2021-10-10: qty 20

## 2021-10-10 NOTE — Progress Notes (Signed)
Dr. Burr Medico, notified of pt's elevated SBP.  Verbal order given for Clonidine 0.2mg  PO PRN to be administered 45 mins prior to Bevacizumab administration.  Instruct Infusion RN Thurmond Butts) to retake pt's BP 70mins after the Clonidine has been administered.  If SBP is w/in tx parameters, order the Bevacizumab from pharmacy.  If not, notify Dr. Burr Medico for further instructions.

## 2021-10-10 NOTE — Progress Notes (Signed)
Per Dr. Burr Medico, "OK To Treat w/out urine protein test today."

## 2021-10-10 NOTE — Progress Notes (Signed)
Jim Little   [phone]) [phone] [phone]) [phone]   Clinic Follow up Note   Patient Care Team: [name], MD as PCP - General (Family Medicine) Jonnie Finner, RN (Inactive) as Oncology Nurse Navigator Heilingoetter, Tobe Sos, PA-C as Physician Assistant (Physician Assistant) Truitt Merle, MD as Consulting Physician (Oncology)  Date of Service:  10/10/2021  CHIEF COMPLAINT: f/u of [facility]  CURRENT THERAPY:  Tecentriq and Avastin, q3weeks, starting 06/29/21  ASSESSMENT & PLAN:  Jim Little is a 75 y.o. male with   1. Hepatocellular Carcinoma, unresectable, stage IIIA, with node metastasis  -Based on MRI liver 01/12/21 with Advanced cirrhotic changes involving the liver and Large infiltrating mass versus numerous confluent masses involving the right hepatic lobe. This is consistent with hepatocellular carcinoma. Associated thrombosed middle and portal veins. No definite left hepatic lobe lesions. AFP (01/14/21) 11,904.0. -His 01/15/21 CT of the chest did not show metastatic disease to the lungs. Staging workup completed. -He has unclear etiology of cirrhosis, which is followed by Dr. Therisa Doyne. Hep B surface antigen non reactive (05/15/20), ANA negative, Hep C ab non reactive. No alcohol abuse. -He is s/p Y 90 radioembolization on 03/08/21. His AFP has dropped significantly after Y90 -MRI scan from 05/29/2021 showed good response in liver, but he developed hepatoduodenal ligament lymph node measuring 2.0 x 1.7 cm, likely metastatic disease   -given his extrahepatic metastasis, I recommend systemic treatment Tecentriq and Avastin. Pt has not been compliant with oral medicine, oral TKI will be challenge for him. He had a EGD in 07/2020, showed no significant varices, okay to proceed Avastin -He started Tecentriq and Avastin on 06/29/21. He has tolerated relatively well, with constipation. -The CT scan from 07/24/21 during his ED visit showed significant improvement of his liver  cancer and lymph node, no new lesions.  He has had a good response to treatment so far -He is clinically stable, lab reviewed, adequate for treatment, will proceed cycle 5 today -plan to repeat CT scan in Jan    2. Constipation -improved -He has Dulcolax, MiraLAX, and lactulose. I refilled his lactulose   3. Scrotal swelling -developed following Y90 treatment -he is followed by [facility] for history of urinary retention and brought this to Dr. Keane Scrape attention on 04/30/21.  -his most recent scrotal US from 09/18/21, showed bilateral epididymitis and extensive microlithiasis   4. Social Support, Language barrier -From the [place], was at a refugee camp in [place] -Has several family members living in single house. 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 Neither he nor his family speak [place]  -He speaks Swahili. He has [facility], Honor Loh, who helps him. Her contact is [phone]   5. Portal vein thrombosis, Cirrhosis  -Seen with direct admission from 01/13/21-01/16/21 -Patient received heparin drip inpatient. Dr. Marin Olp did not recommend anticoagulation since the thrombus is tumor thrombus. -His liver cirrhosis is followed by Dr. Therisa Doyne from Gastroenterology. Prior work up with EGD/Colonoscopy in 07/2020 which showed no varices      PLAN: -proceed with C5 Tecentriq and Avastin today -labs, f/u, and C6 in 3 weeks -f/u and C7 in 6 weeks with lab and CT AP to be done several days before -I will reach out to [facility] and she will help to pick up medications for him and schedule CT, and bring his documents for his financial assistance    No problem-specific Assessment & Plan notes found for this encounter.  SUMMARY OF ONCOLOGIC HISTORY: Oncology History Overview Note  Cancer Staging Hepatocellular carcinoma [facility]) Staging form: Liver, AJCC 8th Edition - Clinical stage from 02/09/2021: Stage IIIA  (cT3, cN0, cM0) - Signed by Truitt Merle, MD on 02/09/2021 Stage prefix: Initial diagnosis    Hepatocellular carcinoma ([place])  05/12/2020 Imaging   MRI Liver  IMPRESSION: 1. Geographic lesion in the inferior medial aspect of the RIGHT hepatic lobe has some imaging features suggesting focal fatty infiltration. However, atypical enhancement pattern. Recommend follow-up contrast MRI in 3 to 6 months to re-evaluate. 2. No additional lesion liver. 3. Morphologic changes consistent with cirrhosis.   01/12/2021 Imaging   MRI Abdomen  IMPRESSION: 1. Advanced cirrhotic changes involving the liver. There is a large infiltrating mass versus numerous confluent masses involving the right hepatic lobe and consistent with hepatocellular carcinoma. Associated thrombosed middle and portal veins. No definite left hepatic lobe lesions. LI-RADS 5. 2. Borderline splenomegaly. 3. No ascites or abdominal wall hernia.     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 The ultrasound correlate of the previously demonstrated right liver tumor is heterogeneous tissue with ill-defined margin.   In addition to the right-sided tumor, there are several small hyperechoic foci of approximately 1 cm, potentially additional Parker foci versus regenerative nodules.   Cirrhosis and splenomegaly   01/15/2021 Imaging   CT Chest  IMPRESSION: 1. No evidence of metastatic disease in the chest. 2. Cirrhotic morphology of the liver with ill-defined, heterogeneous mass of the right lobe of the liver and occlusion of the right portal vein, in keeping with known hepatocellular carcinoma, better demonstrated by recent MR.   02/09/2021 Initial Diagnosis   Hepatocellular carcinoma ([place])   02/09/2021 Cancer Staging   Staging form: Liver, AJCC 8th  Edition - Clinical stage from 02/09/2021: Stage IIIA (cT3, cN0, cM0) - Signed by Truitt Merle, MD on 02/09/2021 Stage prefix: Initial diagnosis    05/29/2021 Imaging   MRI Abdomen  IMPRESSION: 1. Interval decrease in size of the infiltrating mass in the right hepatic lobe showing heterogeneous arterial phase hyperenhancement. Persistent arterial phase enhancement can be seen early post y 14 embolization. No substantial restricted diffusion in the lesion on today's study. 2. Tiny focus of arterial phase hyperenhancement in the dome of the liver near the junction of segment IV and VIII. Unclear whether this was present previously given the marked motion degradation on the earlier study. Close attention on follow-up recommended. 3. 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 Subtraction postcontrast imaging suggests that there may be some enhancement in the thrombus suggesting tumor thrombus or a combination of tumor and bland thrombus. 4. Interval increase in size of the hepatoduodenal ligament lymph node measuring 2.0 x 1.7 cm. Likely metastatic disease,  continued close attention on follow-up recommended. 5. Markedly distended urinary bladder.   06/29/2021 -  Chemotherapy   Patient is on Treatment Plan : LUNG Atezolizumab + Bevacizumab q21d Maintenance        INTERVAL HISTORY:  Jim Little is here for a follow up of [place]. He was last seen by me on 09/19/21. He presents to the clinic accompanied by an interpreter. He reports he is doing well overall. He reports his  bowel movements have improved. He notes he is grateful for treatment here, as he feels much better overall.  He notes there is some paperwork he is being asked for, but he doesn't not know what they need. He notes he has tried to bring different things, like social security card, ID, insurance, and  none were what they wanted.   All other systems were reviewed with the patient and are negative.  MEDICAL HISTORY:  Past Medical History:  Diagnosis Date   BPH (benign prostatic hyperplasia)    Cirrhosis ([place])    Hypertension    Splenomegaly     SURGICAL HISTORY: Past Surgical History:  Procedure Laterality Date   BIOPSY  08/22/2020   Procedure: BIOPSY;  Surgeon: Ronnette Juniper, MD;  Location: [facility];  Service: Gastroenterology;;   COLONOSCOPY WITH PROPOFOL N/A 08/22/2020   Procedure: COLONOSCOPY WITH PROPOFOL;  Surgeon: Ronnette Juniper, MD;  Location: [facility];  Service: Gastroenterology;  Laterality: N/A;   ESOPHAGOGASTRODUODENOSCOPY (EGD) WITH PROPOFOL N/A 08/22/2020   Procedure: ESOPHAGOGASTRODUODENOSCOPY (EGD) WITH PROPOFOL;  Surgeon: Ronnette Juniper, MD;  Location: [facility];  Service: Gastroenterology;  Laterality: N/A;   IR ANGIOGRAM SELECTIVE EACH ADDITIONAL VESSEL  03/01/2021   IR ANGIOGRAM SELECTIVE EACH ADDITIONAL VESSEL  03/08/2021   IR ANGIOGRAM VISCERAL SELECTIVE  03/01/2021   IR ANGIOGRAM VISCERAL SELECTIVE  03/01/2021   IR ANGIOGRAM VISCERAL SELECTIVE  03/08/2021   IR EMBO TUMOR ORGAN ISCHEMIA INFARCT INC GUIDE ROADMAPPING  03/08/2021   IR RADIOLOGIST EVAL & MGMT  02/06/2021   IR RADIOLOGIST EVAL & MGMT  05/31/2021   IR US GUIDE VASC ACCESS RIGHT  03/01/2021   IR US GUIDE VASC ACCESS RIGHT  03/08/2021   UPPER GASTROINTESTINAL ENDOSCOPY  03/2018   [place] --candidiasis plus esophageal varices grade 2.  Treated with fluconazole and status post esophageal banding.   US ECHOCARDIOGRAPHY  10/2016   per overseas records from [place] - LVEF 68%; mildly calcified aortic valve, normal function otherwise    I have reviewed the social history and family history with the patient and they are unchanged from previous note.  ALLERGIES:  is allergic to pork-derived products.  MEDICATIONS:  Current Outpatient Medications  Medication Sig Dispense Refill   acetaminophen  (TYLENOL) 500 MG tablet Take 500 mg by mouth 2 (two) times daily as needed for mild pain.     amLODipine (NORVASC) 5 MG tablet Take 1 tablet (5 mg total) by mouth daily. 90 tablet 3   lactulose (KRISTALOSE) 10 g packet TAKE 1 PACKET (10 G TOTAL) BY MOUTH AS NEEDED (CONSTIPATION). DISSOLVE IN EIGHT OUNCES OF WATER 30 each 1   olopatadine (PATANOL) 0.1 % ophthalmic solution Place 1 drop into both eyes 2 (two) times daily. 5 mL 12   omeprazole (PRILOSEC) 40 MG capsule Take 1 capsule (40 mg total) by mouth daily. 90 capsule 3   tamsulosin (FLOMAX) 0.4 MG CAPS capsule Take 1 capsule (0.4 mg total) by mouth daily. Please pack and mail to home 90 capsule 3   traMADol (ULTRAM) 50 MG tablet Take 1 tablet (50 mg total) by mouth every 12 (twelve) hours as needed for severe pain. 20 tablet 0   No current facility-administered medications for this visit.   Facility-Administered Medications Ordered in Other Visits  Medication Dose Route Frequency Provider Last Rate Last Admin   sodium chloride flush (NS) 0.9 % injection 10 mL  10 mL Intracatheter PRN Truitt Merle, MD   10 mL at 09/19/21 1604    PHYSICAL  EXAMINATION: ECOG PERFORMANCE STATUS: 1 - Symptomatic but completely ambulatory  Vitals:   10/10/21 1211  BP: (!) 167/81  Pulse: 73  Resp: 18  Temp: 98.3 F (36.8 C)  SpO2: 100%   Wt Readings from Last 3 Encounters:  10/10/21 142 lb 9.6 oz (64.7 kg)  09/19/21 136 lb (61.7 kg)  07/24/21 139 lb 15.9 oz (63.5 kg)     GENERAL:alert, no distress and comfortable SKIN: skin color normal, no rashes or significant lesions EYES: normal, Conjunctiva are pink and non-injected, sclera clear  NEURO: alert & oriented x 3 with fluent speech  LABORATORY DATA:  I have reviewed the data as listed CBC Latest Ref Rng & Units 10/10/2021 09/19/2021 08/29/2021  WBC 4.0 - 10.5 K/uL 5.4 4.5 4.7  Hemoglobin 13.0 - 17.0 g/dL 13.6 12.9(L) 13.0  Hematocrit 39.0 - 52.0 % 39.6 38.0(L) 38.6(L)  Platelets 150 - 400 K/uL  141(L) 185 134(L)     CMP Latest Ref Rng & Units 10/10/2021 09/19/2021 08/29/2021  Glucose 70 - 99 mg/dL 134(H) 132(H) 106(H)  BUN 8 - 23 mg/dL 11 18 15   Creatinine 0.61 - 1.24 mg/dL 0.93 0.84 1.02  Sodium 135 - 145 mmol/L 137 138 139  Potassium 3.5 - 5.1 mmol/L 4.1 4.0 4.1  Chloride 98 - 111 mmol/L 106 108 104  CO2 22 - 32 mmol/L 24 23 27   Calcium 8.9 - 10.3 mg/dL 8.9 9.4 9.4  Total Protein 6.5 - 8.1 g/dL 8.7(H) 8.7(H) 9.0(H)  Total Bilirubin 0.3 - 1.2 mg/dL 0.6 0.5 0.8  Alkaline Phos 38 - 126 U/L 153(H) 203(H) 181(H)  AST 15 - 41 U/L 44(H) 56(H) 49(H)  ALT 0 - 44 U/L 31 41 39      RADIOGRAPHIC STUDIES: I have personally reviewed the radiological images as listed and agreed with the findings in the report. No results found.    Orders Placed This Encounter  Procedures   CT ABDOMEN PELVIS W CONTRAST    Standing Status:   Future    Standing Expiration Date:   10/10/2022    Order Specific Question:   If indicated for the ordered procedure, I authorize the administration of contrast media per Radiology protocol    Answer:   Yes    Order Specific Question:   Preferred imaging location?    Answer:   [facility]    Order Specific Question:   Is Oral Contrast requested for this exam?    Answer:   Yes, Per Radiology protocol   All questions were answered. The patient knows to call the clinic with any problems, questions or concerns. No barriers to learning was detected. The total time spent in the appointment was 30 minutes.     Truitt Merle, MD 10/10/2021   I, Wilburn Mylar, am acting as scribe for Truitt Merle, MD.   I have reviewed the above documentation for accuracy and completeness, and I agree with the above.

## 2021-10-10 NOTE — Patient Instructions (Signed)
[facility] ONCOLOGY   Discharge Instructions: Thank you for choosing [facility] to provide your oncology and hematology care.   If you have a lab appointment with the [facility], please go directly to the [facility] and check in at the registration area.   Wear comfortable clothing and clothing appropriate for easy access to any Portacath or PICC line.   We strive to give you quality time with your provider. You may need to reschedule your appointment if you arrive late (15 or more minutes).  Arriving late affects you and other patients whose appointments are after yours.  Also, if you miss three or more appointments without notifying the office, you may be dismissed from the clinic at the provider's discretion.      For prescription refill requests, have your pharmacy contact our office and allow 72 hours for refills to be completed.    Today you received the following chemotherapy and/or immunotherapy agents: atezolizumab and bevacizumab-bvzr      To help prevent nausea and vomiting after your treatment, we encourage you to take your nausea medication as directed.  BELOW ARE SYMPTOMS THAT SHOULD BE REPORTED IMMEDIATELY: *FEVER GREATER THAN 100.4 F (38 C) OR HIGHER *CHILLS OR SWEATING *NAUSEA AND VOMITING THAT IS NOT CONTROLLED WITH YOUR NAUSEA MEDICATION *UNUSUAL SHORTNESS OF BREATH *UNUSUAL BRUISING OR BLEEDING *URINARY PROBLEMS (pain or burning when urinating, or frequent urination) *BOWEL PROBLEMS (unusual diarrhea, constipation, pain near the anus) TENDERNESS IN MOUTH AND THROAT WITH OR WITHOUT PRESENCE OF ULCERS (sore throat, sores in mouth, or a toothache) UNUSUAL RASH, SWELLING OR PAIN  UNUSUAL VAGINAL DISCHARGE OR ITCHING   Items with * indicate a potential emergency and should be followed up as soon as possible or go to the Emergency Department if any problems should occur.  Please show the CHEMOTHERAPY ALERT CARD or IMMUNOTHERAPY  ALERT CARD at check-in to the Emergency Department and triage nurse.  Should you have questions after your visit or need to cancel or reschedule your appointment, please contact [facility]  Dept: [phone]  and follow the prompts.  Office hours are 8:00 a.m. to 4:30 p.m. Monday - Friday. Please note that voicemails left after 4:00 p.m. may not be returned until the following business day.  We are closed weekends and major holidays. You have access to a nurse at all times for urgent questions. Please call the main number to the clinic Dept: [phone] and follow the prompts.   For any non-urgent questions, you may also contact your provider using MyChart. We now offer e-Visits for anyone 43 and older to request care online for non-urgent symptoms. For details visit [email or web address].   Also download the MyChart app! Go to the app store, search "MyChart", open the app, select [facility], and log in with your MyChart username and password.  Due to Covid, a mask is required upon entering the hospital/clinic. If you do not have a mask, one will be given to you upon arrival. For doctor visits, patients may have 1 support person aged 25 or older with them. For treatment visits, patients cannot have anyone with them due to current Covid guidelines and our immunocompromised population.

## 2021-10-11 ENCOUNTER — Telehealth: Payer: Self-pay | Admitting: Hematology

## 2021-10-11 LAB — AFP TUMOR MARKER: AFP, Serum, Tumor Marker: 1932 ng/mL — ABNORMAL HIGH (ref 0.0–8.4)

## 2021-10-11 NOTE — Telephone Encounter (Signed)
Scheduled appointment per 11/17 los. Patient is aware. 

## 2021-10-12 ENCOUNTER — Telehealth: Payer: Self-pay | Admitting: Family Medicine

## 2021-10-12 ENCOUNTER — Other Ambulatory Visit: Payer: Self-pay | Admitting: Family Medicine

## 2021-10-12 ENCOUNTER — Other Ambulatory Visit: Payer: Self-pay | Admitting: Hematology

## 2021-10-12 DIAGNOSIS — E063 Autoimmune thyroiditis: Secondary | ICD-10-CM

## 2021-10-12 DIAGNOSIS — E039 Hypothyroidism, unspecified: Secondary | ICD-10-CM

## 2021-10-12 MED ORDER — LEVOTHYROXINE SODIUM 25 MCG PO TABS: 50.0000 ug | ORAL_TABLET | Freq: Every day | ORAL | 1 refills | Status: DC

## 2021-10-12 NOTE — Telephone Encounter (Signed)
TSH noted to be 43. Likely due to checkpoint inhibitor. Free T4 and TSH to be repeated  at upcoming visits. Given age and likely new thyroid disease, will start levothyroxine 25 mcg, will need titration every 4-6 weeks.  [name]--can you please instruct Jim Little to take this medication by itself ~30 minutes before breakfast. He should take at separate time from omeprazole, ideally spaced by 4 hours. I recognize this may be tricky for him---it would likely be best to take his omeprazole at night from now on.   I have sent to [place]. Can you please let me know when he starts the medication? Will help with timing adjustment.   Let me know what questions he/family have.   [name], MD  Family Medicine Teaching Service

## 2021-10-14 ENCOUNTER — Telehealth: Payer: Self-pay

## 2021-10-14 NOTE — Telephone Encounter (Signed)
Contacted patient regarding a new medication synthroid. Patient instructed to take before breakfast daily. Instructed not to take together with omeprazole. I will do a home visit after the medication is delivered for further education.  [name] Kathyrn Warmuth RN [facility] [place] [phone]

## 2021-10-17 ENCOUNTER — Telehealth: Payer: Self-pay

## 2021-10-17 ENCOUNTER — Other Ambulatory Visit: Payer: Self-pay | Admitting: Urology

## 2021-10-17 DIAGNOSIS — N50812 Left testicular pain: Secondary | ICD-10-CM

## 2021-10-17 NOTE — Telephone Encounter (Signed)
Contacted pharmacy to check on synthroid. Pharmacy will fill the prescription and deliver as soon as possible.  [name] Shivank Pinedo RN [facility] [place] [phone]

## 2021-10-18 NOTE — Congregational Nurse Program (Signed)
  Dept: [facility] Nurse Program Note  Date of Encounter: 10/18/2021  Past Medical History: Past Medical History:  Diagnosis Date   BPH (benign prostatic hyperplasia)    Cirrhosis ([place])    Hypertension    Splenomegaly     Encounter Details:  Home visit completed. Medication reviewed with patient and his wife. Patient still waiting on Synthroid to be delivered. Educated not to take Synthroid and Prilosec at the same time. Patient to take synthroid in the morning and all other medications in the evening to avoid confusion.patient verbalized understanding.  [name] Lilliauna Van RN [facility] [place] [phone]

## 2021-10-23 ENCOUNTER — Telehealth: Payer: Self-pay

## 2021-10-23 NOTE — Telephone Encounter (Signed)
Contacted patient and talked with both the patient and his wife. They have confirmed that Synthroid was delivered yesterday from pharmacy. I have confirmed via whatsapp video call pill, bottle labelled Synthroid one tablet daily in the morning before breakfast with correct patient name. Patient instructed to take synthroid one pill daily by mouth before breakfast and to take all other daily medications in the evenings to ensure compliance. Both patient and spouse verbalized understanding.  [name] Belmira Daley RN [facility] [place] [phone]

## 2021-10-30 ENCOUNTER — Telehealth: Payer: Self-pay

## 2021-10-30 NOTE — Telephone Encounter (Signed)
Transport scheduled for appointments on 12/8 and 12/9. Patient called and informed.  [name] Ovie Cornelio RN [facility] [place] [phone]

## 2021-11-01 ENCOUNTER — Encounter: Payer: Self-pay | Admitting: Hematology

## 2021-11-01 ENCOUNTER — Inpatient Hospital Stay: Payer: Medicaid Other

## 2021-11-01 ENCOUNTER — Other Ambulatory Visit: Payer: Self-pay

## 2021-11-01 ENCOUNTER — Other Ambulatory Visit: Payer: Self-pay | Admitting: Family Medicine

## 2021-11-01 ENCOUNTER — Inpatient Hospital Stay: Payer: Medicaid Other | Attending: Hematology

## 2021-11-01 ENCOUNTER — Other Ambulatory Visit (HOSPITAL_COMMUNITY): Payer: Self-pay

## 2021-11-01 ENCOUNTER — Inpatient Hospital Stay (HOSPITAL_BASED_OUTPATIENT_CLINIC_OR_DEPARTMENT_OTHER): Payer: Medicaid Other | Admitting: Hematology

## 2021-11-01 ENCOUNTER — Telehealth: Payer: Self-pay

## 2021-11-01 VITALS — BP 147/80 | HR 69 | Temp 98.2°F | Resp 17 | Ht 66.0 in | Wt 148.5 lb

## 2021-11-01 DIAGNOSIS — K4090 Unilateral inguinal hernia, without obstruction or gangrene, not specified as recurrent: Secondary | ICD-10-CM | POA: Diagnosis not present

## 2021-11-01 DIAGNOSIS — Z5112 Encounter for antineoplastic immunotherapy: Secondary | ICD-10-CM | POA: Diagnosis not present

## 2021-11-01 DIAGNOSIS — E039 Hypothyroidism, unspecified: Secondary | ICD-10-CM

## 2021-11-01 DIAGNOSIS — C220 Liver cell carcinoma: Secondary | ICD-10-CM

## 2021-11-01 DIAGNOSIS — H1013 Acute atopic conjunctivitis, bilateral: Secondary | ICD-10-CM

## 2021-11-01 LAB — CBC WITH DIFFERENTIAL (CANCER CENTER ONLY)
Abs Immature Granulocytes: 0 10*3/uL (ref 0.00–0.07)
Basophils Absolute: 0 10*3/uL (ref 0.0–0.1)
Basophils Relative: 0 %
Eosinophils Absolute: 0.2 10*3/uL (ref 0.0–0.5)
Eosinophils Relative: 3 %
HCT: 39.3 % (ref 39.0–52.0)
Hemoglobin: 13.2 g/dL (ref 13.0–17.0)
Immature Granulocytes: 0 %
Lymphocytes Relative: 17 %
Lymphs Abs: 0.9 10*3/uL (ref 0.7–4.0)
MCH: 33.4 pg (ref 26.0–34.0)
MCHC: 33.6 g/dL (ref 30.0–36.0)
MCV: 99.5 fL (ref 80.0–100.0)
Monocytes Absolute: 0.5 10*3/uL (ref 0.1–1.0)
Monocytes Relative: 10 %
Neutro Abs: 3.6 10*3/uL (ref 1.7–7.7)
Neutrophils Relative %: 70 %
Platelet Count: 142 10*3/uL — ABNORMAL LOW (ref 150–400)
RBC: 3.95 MIL/uL — ABNORMAL LOW (ref 4.22–5.81)
RDW: 15.3 % (ref 11.5–15.5)
WBC Count: 5.2 10*3/uL (ref 4.0–10.5)
nRBC: 0 % (ref 0.0–0.2)

## 2021-11-01 LAB — CMP (CANCER CENTER ONLY)
ALT: 53 U/L — ABNORMAL HIGH (ref 0–44)
AST: 69 U/L — ABNORMAL HIGH (ref 15–41)
Albumin: 3.6 g/dL (ref 3.5–5.0)
Alkaline Phosphatase: 183 U/L — ABNORMAL HIGH (ref 38–126)
Anion gap: 9 (ref 5–15)
BUN: 13 mg/dL (ref 8–23)
CO2: 23 mmol/L (ref 22–32)
Calcium: 8.7 mg/dL — ABNORMAL LOW (ref 8.9–10.3)
Chloride: 105 mmol/L (ref 98–111)
Creatinine: 0.99 mg/dL (ref 0.61–1.24)
GFR, Estimated: >60 mL/min (ref >60)
Glucose, Bld: 104 mg/dL — ABNORMAL HIGH (ref 70–99)
Potassium: 4.1 mmol/L (ref 3.5–5.1)
Sodium: 137 mmol/L (ref 135–145)
Total Bilirubin: 0.8 mg/dL (ref 0.3–1.2)
Total Protein: 8.7 g/dL — ABNORMAL HIGH (ref 6.5–8.1)

## 2021-11-01 LAB — TOTAL PROTEIN, URINE DIPSTICK: Protein, ur: 30 mg/dL — AB

## 2021-11-01 LAB — T4, FREE: Free T4: 0.38 ng/dL — ABNORMAL LOW (ref 0.61–1.12)

## 2021-11-01 MED ORDER — PAZEO 0.7 % OP SOLN: OPHTHALMIC | 0 refills | Status: DC

## 2021-11-01 MED ORDER — SODIUM CHLORIDE 0.9 % IV SOLN: Freq: Once | INTRAVENOUS | Status: AC

## 2021-11-01 MED ORDER — SODIUM CHLORIDE 0.9 % IV SOLN
1200.0000 mg | Freq: Once | INTRAVENOUS | Status: AC
  Administered 2021-11-01: 1200 mg via INTRAVENOUS
  Filled 2021-11-01: qty 20

## 2021-11-01 MED ORDER — SODIUM CHLORIDE 0.9 % IV SOLN
15.0000 mg/kg | Freq: Once | INTRAVENOUS | Status: AC
  Administered 2021-11-01: 1000 mg via INTRAVENOUS
  Filled 2021-11-01: qty 32

## 2021-11-01 MED ORDER — TRAMADOL HCL 50 MG PO TABS
50.0000 mg | ORAL_TABLET | Freq: Two times a day (BID) | ORAL | 0 refills | Status: DC | PRN
  Filled 2021-11-01: qty 15, 8d supply, fill #0

## 2021-11-01 NOTE — Patient Instructions (Signed)
[facility] ONCOLOGY   Discharge Instructions: Thank you for choosing [facility] to provide your oncology and hematology care.   If you have a lab appointment with the [facility], please go directly to the [facility] and check in at the registration area.   Wear comfortable clothing and clothing appropriate for easy access to any Portacath or PICC line.   We strive to give you quality time with your provider. You may need to reschedule your appointment if you arrive late (15 or more minutes).  Arriving late affects you and other patients whose appointments are after yours.  Also, if you miss three or more appointments without notifying the office, you may be dismissed from the clinic at the provider's discretion.      For prescription refill requests, have your pharmacy contact our office and allow 72 hours for refills to be completed.    Today you received the following chemotherapy and/or immunotherapy agents: atezolizumab and bevacizumab-bvzr      To help prevent nausea and vomiting after your treatment, we encourage you to take your nausea medication as directed.  BELOW ARE SYMPTOMS THAT SHOULD BE REPORTED IMMEDIATELY: *FEVER GREATER THAN 100.4 F (38 C) OR HIGHER *CHILLS OR SWEATING *NAUSEA AND VOMITING THAT IS NOT CONTROLLED WITH YOUR NAUSEA MEDICATION *UNUSUAL SHORTNESS OF BREATH *UNUSUAL BRUISING OR BLEEDING *URINARY PROBLEMS (pain or burning when urinating, or frequent urination) *BOWEL PROBLEMS (unusual diarrhea, constipation, pain near the anus) TENDERNESS IN MOUTH AND THROAT WITH OR WITHOUT PRESENCE OF ULCERS (sore throat, sores in mouth, or a toothache) UNUSUAL RASH, SWELLING OR PAIN  UNUSUAL VAGINAL DISCHARGE OR ITCHING   Items with * indicate a potential emergency and should be followed up as soon as possible or go to the Emergency Department if any problems should occur.  Please show the CHEMOTHERAPY ALERT CARD or IMMUNOTHERAPY  ALERT CARD at check-in to the Emergency Department and triage nurse.  Should you have questions after your visit or need to cancel or reschedule your appointment, please contact [facility]  Dept: [phone]  and follow the prompts.  Office hours are 8:00 a.m. to 4:30 p.m. Monday - Friday. Please note that voicemails left after 4:00 p.m. may not be returned until the following business day.  We are closed weekends and major holidays. You have access to a nurse at all times for urgent questions. Please call the main number to the clinic Dept: [phone] and follow the prompts.   For any non-urgent questions, you may also contact your provider using MyChart. We now offer e-Visits for anyone 29 and older to request care online for non-urgent symptoms. For details visit [email or web address].   Also download the MyChart app! Go to the app store, search "MyChart", open the app, select [facility], and log in with your MyChart username and password.  Due to Covid, a mask is required upon entering the hospital/clinic. If you do not have a mask, one will be given to you upon arrival. For doctor visits, patients may have 1 support person aged 65 or older with them. For treatment visits, patients cannot have anyone with them due to current Covid guidelines and our immunocompromised population.

## 2021-11-01 NOTE — Telephone Encounter (Signed)
[employer] contacted regarding eye drops. Stated that prior authorization needed. Dr Precious Haws. I will continue to follow up on this.  [name] Christan Ciccarelli RN [facility] [place] [phone]

## 2021-11-01 NOTE — Progress Notes (Signed)
[place]   [phone] [phone]   Clinic Follow up Note   Patient Care Team: [name], MD as PCP - General (Family Medicine) Jonnie Finner, RN (Inactive) as Oncology Nurse Navigator Heilingoetter, Tobe Sos, PA-C as Physician Assistant (Physician Assistant) Truitt Merle, MD as Consulting Physician (Oncology)  Date of Service:  11/01/2021  CHIEF COMPLAINT: f/u of [facility]  CURRENT THERAPY:  Tecentriq and Avastin, q3weeks, starting 06/29/21  ASSESSMENT & PLAN:  Jim Little is a 75 y.o. male with   1. Hepatocellular Carcinoma, unresectable, stage IIIA, with node metastasis  -Based on MRI liver 01/12/21 with Advanced cirrhotic changes involving the liver and Large infiltrating mass versus numerous confluent masses involving the right hepatic lobe. This is consistent with hepatocellular carcinoma. Associated thrombosed middle and portal veins. No definite left hepatic lobe lesions. AFP (01/14/21) 11,904.0. -His 01/15/21 CT of the chest did not show metastatic disease to the lungs. Staging workup completed. -He has unclear etiology of cirrhosis, which is followed by Dr. Therisa Doyne. Hep B surface antigen non reactive (05/15/20), ANA negative, Hep C ab non reactive. No alcohol abuse. -He is s/p Y 90 radioembolization on 03/08/21. His AFP has dropped significantly after Y90 -MRI scan from 05/29/2021 showed good response in liver, but he developed hepatoduodenal ligament lymph node measuring 2.0 x 1.7 cm, likely metastatic disease   -given his extrahepatic metastasis, I recommend systemic treatment Tecentriq and Avastin. Pt has not been compliant with oral medicine, oral TKI will be challenge for him. He had a EGD in 07/2020, showed no significant varices, okay to proceed Avastin -He started Tecentriq and Avastin on 06/29/21. He has tolerated relatively well, with constipation. -The CT scan from 07/24/21 during his ED visit showed significant improvement of his liver  cancer and lymph node, no new lesions.  He has had a good response to treatment so far -He is clinically doing well,feels better since he started treatment, lab reviewed, adequate for treatment, will proceed cycle 5 today -plan to repeat CT scan in Jan    2. Symptom Management: Constipation, Flank pain -constipation improved, he has lactulose.  -he reports occasional right flank pain, secondary to #1. He previously had tramadol but has since run out. I refilled some for him today. He only needs occasionally.   3. Scrotal swelling -developed following Y90 treatment -he is followed by [facility] for history of urinary retention and brought this to Dr. Keane Scrape attention on 04/30/21.  -his most recent scrotal US from 09/18/21, showed bilateral epididymitis and extensive microlithiasis   4. Social Support, Language barrier [place] in [place] -Has several family members living in single house. 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 Neither he nor his family speak [place]  -He speaks Swahili. He has [employer], Honor Loh, who helps him. Her contact is [phone]   5. Portal vein thrombosis, Cirrhosis  -Seen with direct admission from 01/13/21-01/16/21 -Patient received heparin drip inpatient. Dr. Marin Olp did not recommend anticoagulation since the thrombus is tumor thrombus. -His liver cirrhosis is followed by Dr. Therisa Doyne from Gastroenterology. Prior work up with EGD/Colonoscopy in 07/2020 which showed no varices      PLAN: -proceed with C6 Tecentriq and Avastin today -I refilled tramadol, he uses occasionally  -f/u and C7 in 3 weeks with lab and CT AP to be done several days before -I spoke with [name] about his meds  and documents for his financial assistance     No problem-specific Assessment & Plan notes found for this encounter.   SUMMARY OF ONCOLOGIC  HISTORY: Oncology History Overview Note  Cancer Staging Hepatocellular carcinoma [facility]) Staging form: Liver, AJCC 8th Edition - Clinical stage from 02/09/2021: Stage IIIA (cT3, cN0, cM0) - Signed by Truitt Merle, MD on 02/09/2021 Stage prefix: Initial diagnosis    Hepatocellular carcinoma ([place])  05/12/2020 Imaging   MRI Liver  IMPRESSION: 1. Geographic lesion in the inferior medial aspect of the RIGHT hepatic lobe has some imaging features suggesting focal fatty infiltration. However, atypical enhancement pattern. Recommend follow-up contrast MRI in 3 to 6 months to re-evaluate. 2. No additional lesion liver. 3. Morphologic changes consistent with cirrhosis.   01/12/2021 Imaging   MRI Abdomen  IMPRESSION: 1. Advanced cirrhotic changes involving the liver. There is a large infiltrating mass versus numerous confluent masses involving the right hepatic lobe and consistent with hepatocellular carcinoma. Associated thrombosed middle and portal veins. No definite left hepatic lobe lesions. LI-RADS 5. 2. Borderline splenomegaly. 3. No ascites or abdominal wall hernia.     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 The ultrasound correlate of the previously demonstrated right liver tumor is heterogeneous tissue with ill-defined margin.   In addition to the right-sided tumor, there are several small hyperechoic foci of approximately 1 cm, potentially additional Swisher foci versus regenerative nodules.   Cirrhosis and splenomegaly   01/15/2021 Imaging   CT Chest  IMPRESSION: 1. No evidence of metastatic disease in the chest. 2. Cirrhotic morphology of the liver with ill-defined, heterogeneous mass of the right lobe of the liver and occlusion of the right portal vein, in keeping with known hepatocellular  carcinoma, better demonstrated by recent MR.   02/09/2021 Initial Diagnosis   Hepatocellular carcinoma ([place])   02/09/2021 Cancer Staging   Staging form: Liver, AJCC 8th Edition - Clinical stage from 02/09/2021: Stage IIIA (cT3, cN0, cM0) - Signed by Truitt Merle, MD on 02/09/2021 Stage prefix: Initial diagnosis    05/29/2021 Imaging   MRI Abdomen  IMPRESSION: 1. Interval decrease in size of the infiltrating mass in the right hepatic lobe showing heterogeneous arterial phase hyperenhancement. Persistent arterial phase enhancement can be seen early post y 32 embolization. No substantial restricted diffusion in the lesion on today's study. 2. Tiny focus of arterial phase hyperenhancement in the dome of the liver near the junction of segment IV and VIII. Unclear whether this was present previously given the marked motion degradation on the earlier study. Close attention on follow-up recommended. 3. 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 Subtraction postcontrast imaging suggests that there may be some enhancement in the thrombus suggesting tumor thrombus or a combination of tumor and bland thrombus. 4. Interval increase in size of the hepatoduodenal ligament lymph node measuring 2.0 x 1.7 cm. Likely metastatic disease,  continued close attention on follow-up recommended. 5. Markedly distended urinary bladder.   06/29/2021 -  Chemotherapy   Patient is on Treatment Plan : LUNG Atezolizumab + Bevacizumab q21d Maintenance        INTERVAL HISTORY:  Geofrey Silliman is here for a follow up of [place]. He was last seen by  me on 10/10/21. He presents to the clinic accompanied by an interpreter. He reports some continued pain to his right flank that happen occasionally. He reports feeling better overall as treatment goes on.   All other systems were reviewed with the  patient and are negative.  MEDICAL HISTORY:  Past Medical History:  Diagnosis Date   BPH (benign prostatic hyperplasia)    Cirrhosis ([place])    Hypertension    Splenomegaly     SURGICAL HISTORY: Past Surgical History:  Procedure Laterality Date   BIOPSY  08/22/2020   Procedure: BIOPSY;  Surgeon: Ronnette Juniper, MD;  Location: [facility];  Service: Gastroenterology;;   COLONOSCOPY WITH PROPOFOL N/A 08/22/2020   Procedure: COLONOSCOPY WITH PROPOFOL;  Surgeon: Ronnette Juniper, MD;  Location: [facility];  Service: Gastroenterology;  Laterality: N/A;   ESOPHAGOGASTRODUODENOSCOPY (EGD) WITH PROPOFOL N/A 08/22/2020   Procedure: ESOPHAGOGASTRODUODENOSCOPY (EGD) WITH PROPOFOL;  Surgeon: Ronnette Juniper, MD;  Location: [facility];  Service: Gastroenterology;  Laterality: N/A;   IR ANGIOGRAM SELECTIVE EACH ADDITIONAL VESSEL  03/01/2021   IR ANGIOGRAM SELECTIVE EACH ADDITIONAL VESSEL  03/08/2021   IR ANGIOGRAM VISCERAL SELECTIVE  03/01/2021   IR ANGIOGRAM VISCERAL SELECTIVE  03/01/2021   IR ANGIOGRAM VISCERAL SELECTIVE  03/08/2021   IR EMBO TUMOR ORGAN ISCHEMIA INFARCT INC GUIDE ROADMAPPING  03/08/2021   IR RADIOLOGIST EVAL & MGMT  02/06/2021   IR RADIOLOGIST EVAL & MGMT  05/31/2021   IR US GUIDE VASC ACCESS RIGHT  03/01/2021   IR US GUIDE VASC ACCESS RIGHT  03/08/2021   UPPER GASTROINTESTINAL ENDOSCOPY  03/2018   [place] --candidiasis plus esophageal varices grade 2.  Treated with fluconazole and status post esophageal banding.   US ECHOCARDIOGRAPHY  10/2016   per overseas records from [place] - LVEF 68%; mildly calcified aortic valve, normal function otherwise    I have reviewed the social history and family history with the patient and they are unchanged from previous note.  ALLERGIES:  is allergic to pork-derived products.  MEDICATIONS:  Current Outpatient Medications  Medication Sig Dispense Refill   acetaminophen (TYLENOL) 500 MG tablet Take 500 mg by mouth 2 (two) times daily as needed for  mild pain.     amLODipine (NORVASC) 5 MG tablet Take 1 tablet (5 mg total) by mouth daily. 90 tablet 3   lactulose (KRISTALOSE) 10 g packet TAKE 1 PACKET (10 G TOTAL) BY MOUTH AS NEEDED (CONSTIPATION). DISSOLVE IN EIGHT OUNCES OF WATER 30 each 1   levothyroxine (SYNTHROID) 25 MCG tablet Take 2 tablets (50 mcg total) by mouth daily before breakfast. 30 tablet 1   Olopatadine HCl (PAZEO) 0.7 % SOLN Use once daily in both eyes 2.5 mL 0   omeprazole (PRILOSEC) 40 MG capsule Take 1 capsule (40 mg total) by mouth daily. 90 capsule 3   tamsulosin (FLOMAX) 0.4 MG CAPS capsule Take 1 capsule (0.4 mg total) by mouth daily. Please pack and mail to home 90 capsule 3   traMADol (ULTRAM) 50 MG tablet Take 1 tablet (50 mg total) by mouth every 12 (twelve) hours as needed for severe pain. 15 tablet 0   No current facility-administered medications for this visit.   Facility-Administered Medications Ordered in Other Visits  Medication Dose Route Frequency Provider Last Rate Last Admin   sodium chloride flush (NS) 0.9 % injection 10 mL  10 mL Intracatheter PRN Truitt Merle, MD   10 mL at 09/19/21 1604    PHYSICAL EXAMINATION: ECOG PERFORMANCE STATUS: 1 - Symptomatic but completely  ambulatory  Vitals:   11/01/21 1002  BP: (!) 147/80  Pulse: 69  Resp: 17  Temp: 98.2 F (36.8 C)  SpO2: 100%   Wt Readings from Last 3 Encounters:  11/01/21 67.4 kg  10/10/21 64.7 kg  09/19/21 61.7 kg     GENERAL:alert, no distress and comfortable SKIN: skin color normal, no rashes or significant lesions EYES: normal, Conjunctiva are pink and non-injected, sclera clear  NEURO: alert & oriented x 3 with fluent speech  LABORATORY DATA:  I have reviewed the data as listed CBC Latest Ref Rng & Units 11/01/2021 10/10/2021 09/19/2021  WBC 4.0 - 10.5 K/uL 5.2 5.4 4.5  Hemoglobin 13.0 - 17.0 g/dL 13.2 13.6 12.9(L)  Hematocrit 39.0 - 52.0 % 39.3 39.6 38.0(L)  Platelets 150 - 400 K/uL 142(L) 141(L) 185     CMP Latest Ref  Rng & Units 11/01/2021 10/10/2021 09/19/2021  Glucose 70 - 99 mg/dL 104(H) 134(H) 132(H)  BUN 8 - 23 mg/dL 13 11 18   Creatinine 0.61 - 1.24 mg/dL 0.99 0.93 0.84  Sodium 135 - 145 mmol/L 137 137 138  Potassium 3.5 - 5.1 mmol/L 4.1 4.1 4.0  Chloride 98 - 111 mmol/L 105 106 108  CO2 22 - 32 mmol/L 23 24 23   Calcium 8.9 - 10.3 mg/dL 8.7(L) 8.9 9.4  Total Protein 6.5 - 8.1 g/dL 8.7(H) 8.7(H) 8.7(H)  Total Bilirubin 0.3 - 1.2 mg/dL 0.8 0.6 0.5  Alkaline Phos 38 - 126 U/L 183(H) 153(H) 203(H)  AST 15 - 41 U/L 69(H) 44(H) 56(H)  ALT 0 - 44 U/L 53(H) 31 41      RADIOGRAPHIC STUDIES: I have personally reviewed the radiological images as listed and agreed with the findings in the report. No results found.    No orders of the defined types were placed in this encounter.  All questions were answered. The patient knows to call the clinic with any problems, questions or concerns. No barriers to learning was detected. The total time spent in the appointment was 30 minutes.     Truitt Merle, MD 11/01/2021   I, Wilburn Mylar, am acting as scribe for Truitt Merle, MD.   I have reviewed the above documentation for accuracy and completeness, and I agree with the above.

## 2021-11-01 NOTE — Telephone Encounter (Signed)
Contacted patient with appointment reminder for today.  [name] Euva Rundell RN [facility] [place] [phone]

## 2021-11-02 ENCOUNTER — Encounter: Payer: Self-pay | Admitting: Hematology

## 2021-11-02 ENCOUNTER — Ambulatory Visit
Admission: RE | Admit: 2021-11-02 | Discharge: 2021-11-02 | Disposition: A | Discharge status: Home / Self Care | Payer: Medicaid Other | Source: Ambulatory Visit | Attending: Urology | Admitting: Urology

## 2021-11-02 DIAGNOSIS — N50812 Left testicular pain: Secondary | ICD-10-CM

## 2021-11-04 ENCOUNTER — Other Ambulatory Visit: Payer: Self-pay | Admitting: Family Medicine

## 2021-11-04 DIAGNOSIS — E032 Hypothyroidism due to medicaments and other exogenous substances: Secondary | ICD-10-CM | POA: Insufficient documentation

## 2021-11-05 NOTE — Telephone Encounter (Signed)
Transportation assistance provided.  [name] Aaira Oestreicher RN [facility] [place] [phone]

## 2021-11-09 ENCOUNTER — Other Ambulatory Visit (HOSPITAL_COMMUNITY): Payer: Self-pay

## 2021-11-15 ENCOUNTER — Telehealth: Payer: Self-pay

## 2021-11-15 NOTE — Telephone Encounter (Signed)
[place] to check eye drops olopatadine Hcl 0.7 % solution. Per pharmacist, 0.7% is no longer available but 0.2% is available over the counter. I will inform the Dr Owens Shark.  [name] Rut Betterton RN [facility] [place] [phone]

## 2021-11-20 ENCOUNTER — Ambulatory Visit (HOSPITAL_COMMUNITY): Payer: Medicaid Other

## 2021-11-20 ENCOUNTER — Telehealth: Payer: Self-pay

## 2021-11-20 NOTE — Telephone Encounter (Signed)
Patient was a no show for today`s CT scan appointment. Rescheduled for January 4th. Arrival time 4:15 pm. Contrast time 2:30 pm and 3:30 pm. Patient to be NPO for 4 hrs prior to testing.  [name] Judithann Villamar RN [facility] [place] [phone]

## 2021-11-21 ENCOUNTER — Telehealth: Payer: Self-pay

## 2021-11-21 NOTE — Telephone Encounter (Signed)
Contacted patient with appointment reminder.  [name] Shigeo Baugh RN [facility] [place] [phone]

## 2021-11-22 ENCOUNTER — Inpatient Hospital Stay: Payer: Medicaid Other

## 2021-11-22 ENCOUNTER — Encounter: Payer: Self-pay | Admitting: Hematology

## 2021-11-22 ENCOUNTER — Telehealth: Payer: Self-pay | Admitting: Family Medicine

## 2021-11-22 ENCOUNTER — Inpatient Hospital Stay (HOSPITAL_BASED_OUTPATIENT_CLINIC_OR_DEPARTMENT_OTHER): Payer: Medicaid Other | Admitting: Hematology

## 2021-11-22 ENCOUNTER — Other Ambulatory Visit: Payer: Self-pay

## 2021-11-22 ENCOUNTER — Telehealth: Payer: Self-pay

## 2021-11-22 VITALS — BP 155/81 | HR 78 | Temp 98.5°F | Resp 18 | Ht 66.0 in | Wt 148.8 lb

## 2021-11-22 DIAGNOSIS — Z5112 Encounter for antineoplastic immunotherapy: Secondary | ICD-10-CM | POA: Diagnosis not present

## 2021-11-22 DIAGNOSIS — C220 Liver cell carcinoma: Secondary | ICD-10-CM

## 2021-11-22 DIAGNOSIS — E039 Hypothyroidism, unspecified: Secondary | ICD-10-CM

## 2021-11-22 DIAGNOSIS — E063 Autoimmune thyroiditis: Secondary | ICD-10-CM

## 2021-11-22 LAB — CMP (CANCER CENTER ONLY)
ALT: 46 U/L — ABNORMAL HIGH (ref 0–44)
AST: 67 U/L — ABNORMAL HIGH (ref 15–41)
Albumin: 3.8 g/dL (ref 3.5–5.0)
Alkaline Phosphatase: 177 U/L — ABNORMAL HIGH (ref 38–126)
Anion gap: 6 (ref 5–15)
BUN: 16 mg/dL (ref 8–23)
CO2: 26 mmol/L (ref 22–32)
Calcium: 9.4 mg/dL (ref 8.9–10.3)
Chloride: 106 mmol/L (ref 98–111)
Creatinine: 0.88 mg/dL (ref 0.61–1.24)
GFR, Estimated: >60 mL/min (ref >60)
Glucose, Bld: 108 mg/dL — ABNORMAL HIGH (ref 70–99)
Potassium: 4 mmol/L (ref 3.5–5.1)
Sodium: 138 mmol/L (ref 135–145)
Total Bilirubin: 0.8 mg/dL (ref 0.3–1.2)
Total Protein: 9 g/dL — ABNORMAL HIGH (ref 6.5–8.1)

## 2021-11-22 LAB — CBC WITH DIFFERENTIAL (CANCER CENTER ONLY)
Abs Immature Granulocytes: 0.02 10*3/uL (ref 0.00–0.07)
Basophils Absolute: 0 10*3/uL (ref 0.0–0.1)
Basophils Relative: 0 %
Eosinophils Absolute: 0.1 10*3/uL (ref 0.0–0.5)
Eosinophils Relative: 1 %
HCT: 40.6 % (ref 39.0–52.0)
Hemoglobin: 13.6 g/dL (ref 13.0–17.0)
Immature Granulocytes: 0 %
Lymphocytes Relative: 14 %
Lymphs Abs: 0.8 10*3/uL (ref 0.7–4.0)
MCH: 33.8 pg (ref 26.0–34.0)
MCHC: 33.5 g/dL (ref 30.0–36.0)
MCV: 101 fL — ABNORMAL HIGH (ref 80.0–100.0)
Monocytes Absolute: 0.6 10*3/uL (ref 0.1–1.0)
Monocytes Relative: 11 %
Neutro Abs: 4.3 10*3/uL (ref 1.7–7.7)
Neutrophils Relative %: 74 %
Platelet Count: 125 10*3/uL — ABNORMAL LOW (ref 150–400)
RBC: 4.02 MIL/uL — ABNORMAL LOW (ref 4.22–5.81)
RDW: 15.7 % — ABNORMAL HIGH (ref 11.5–15.5)
WBC Count: 5.9 10*3/uL (ref 4.0–10.5)
nRBC: 0 % (ref 0.0–0.2)

## 2021-11-22 LAB — TSH: TSH: 48.585 u[IU]/mL — ABNORMAL HIGH (ref 0.320–4.118)

## 2021-11-22 LAB — TOTAL PROTEIN, URINE DIPSTICK: Protein, ur: 100 mg/dL — AB

## 2021-11-22 LAB — T4, FREE: Free T4: 0.43 ng/dL — ABNORMAL LOW (ref 0.61–1.12)

## 2021-11-22 MED ORDER — SODIUM CHLORIDE 0.9 % IV SOLN
1200.0000 mg | Freq: Once | INTRAVENOUS | Status: AC
  Administered 2021-11-22: 13:00:00 1200 mg via INTRAVENOUS
  Filled 2021-11-22: qty 20

## 2021-11-22 MED ORDER — LEVOTHYROXINE SODIUM 25 MCG PO TABS: 75.0000 ug | ORAL_TABLET | Freq: Every day | ORAL | 1 refills | Status: DC

## 2021-11-22 MED ORDER — SODIUM CHLORIDE 0.9 % IV SOLN: Freq: Once | INTRAVENOUS | Status: AC

## 2021-11-22 MED ORDER — ACETAMINOPHEN 325 MG PO TABS
325.0000 mg | ORAL_TABLET | Freq: Once | ORAL | Status: AC
  Administered 2021-11-22: 13:00:00 325 mg via ORAL
  Filled 2021-11-22: qty 1

## 2021-11-22 MED ORDER — SODIUM CHLORIDE 0.9 % IV SOLN
15.0000 mg/kg | Freq: Once | INTRAVENOUS | Status: AC
  Administered 2021-11-22: 13:00:00 1000 mg via INTRAVENOUS
  Filled 2021-11-22: qty 32

## 2021-11-22 NOTE — Progress Notes (Signed)
[facility]   Telephone:([phone] [phone]) [phone]   Clinic Follow up Note   Patient Care Team: [name], MD as PCP - General (Family Medicine) Jonnie Finner, RN (Inactive) as Oncology Nurse Navigator Heilingoetter, Tobe Sos, PA-C as Physician Assistant (Physician Assistant) Truitt Merle, MD as Consulting Physician (Oncology)  Date of Service:  11/22/2021  CHIEF COMPLAINT: f/u of [facility]  CURRENT THERAPY:  Tecentriq and Avastin, q3weeks, starting 06/29/21  ASSESSMENT & PLAN:  Jim Little is a 75 y.o. male with   1. Hepatocellular Carcinoma, unresectable, stage IIIA, with node metastasis  -Based on MRI liver 01/12/21 with Advanced cirrhotic changes involving the liver and Large infiltrating mass versus numerous confluent masses involving the right hepatic lobe. This is consistent with hepatocellular carcinoma. Associated thrombosed middle and portal veins. No definite left hepatic lobe lesions. AFP (01/14/21) 11,904.0. -His 01/15/21 CT of the chest did not show metastatic disease to the lungs. Staging workup completed. -He has unclear etiology of cirrhosis, which is followed by Dr. Therisa Doyne. Hep B surface antigen non reactive (05/15/20), ANA negative, Hep C ab non reactive. No alcohol abuse. -He is s/p Y 90 radioembolization on 03/08/21. His AFP has dropped significantly after Y90 -MRI scan from 05/29/21 showed good response in liver, but he developed hepatoduodenal ligament lymph node measuring 2.0 x 1.7 cm, likely metastatic disease   -given his extrahepatic metastasis, I recommend systemic treatment Tecentriq and Avastin. Pt has not been compliant with oral medicine, oral TKI will be challenge for him. He had a EGD in 07/2020, showed no significant varices, okay to proceed Avastin -He started Tecentriq and Avastin on 06/29/21. He has tolerated relatively well, with constipation. -The CT scan from 07/24/21 during his ED visit showed significant improvement of his liver  cancer and lymph node, no new lesions.  He has had a good response to treatment so far -He is clinically doing well. Lab reviewed, adequate for treatment, will proceed cycle 7 today -CT AP scheduled for 11/28/21.   2. Symptom Management: Constipation, Flank pain -constipation improved, he has lactulose.  -he reports occasional right flank pain, secondary to #1. He previously had tramadol but has since run out. I refilled some for him today. He only needs occasionally.   3. Scrotal swelling -developed following Y90 treatment -he is followed by [facility] for history of urinary retention and brought this to Dr. Keane Scrape attention on 04/30/21.  -his most recent scrotal US from 09/18/21, showed bilateral epididymitis and extensive microlithiasis   4. Social Support, Language barrier -From the [place], was at a refugee camp in [place] -Has several family members living in single house. He is the primary caretaker for his 63 year old granddaughter with cerebral palsy. Neither he nor his family speak [place]  -He speaks Swahili. He has [facility], Honor Loh, who helps him. Her contact is [phone] -he is currently having issues with electricity where he is staying. [name] is involved and is working with [place] and the landlord.   5. Portal vein thrombosis, Cirrhosis  -Seen with direct admission from 01/13/21-01/16/21 -Patient received heparin drip inpatient. Dr. Marin Olp did not recommend anticoagulation since the thrombus is tumor thrombus. -His liver cirrhosis is followed by Dr. Therisa Doyne from Gastroenterology. Prior work up with EGD/Colonoscopy in 07/2020 which showed no varices      PLAN: -proceed with C7 Tecentriq and Avastin today -CT AP on 11/28/21 -lab, flush, f/u, and C8 in 3 weeks   No problem-specific Assessment &  Plan notes found for this encounter.   SUMMARY OF ONCOLOGIC HISTORY: Oncology History Overview Note  Cancer Staging Hepatocellular  carcinoma [facility]) Staging form: Liver, AJCC 8th Edition - Clinical stage from 02/09/2021: Stage IIIA (cT3, cN0, cM0) - Signed by Truitt Merle, MD on 02/09/2021 Stage prefix: Initial diagnosis    Hepatocellular carcinoma ([place])  05/12/2020 Imaging   MRI Liver  IMPRESSION: 1. Geographic lesion in the inferior medial aspect of the RIGHT hepatic lobe has some imaging features suggesting focal fatty infiltration. However, atypical enhancement pattern. Recommend follow-up contrast MRI in 3 to 6 months to re-evaluate. 2. No additional lesion liver. 3. Morphologic changes consistent with cirrhosis.   01/12/2021 Imaging   MRI Abdomen  IMPRESSION: 1. Advanced cirrhotic changes involving the liver. There is a large infiltrating mass versus numerous confluent masses involving the right hepatic lobe and consistent with hepatocellular carcinoma. Associated thrombosed middle and portal veins. No definite left hepatic lobe lesions. LI-RADS 5. 2. Borderline splenomegaly. 3. No ascites or abdominal wall hernia.     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 The ultrasound correlate of the previously demonstrated right liver tumor is heterogeneous tissue with ill-defined margin.   In addition to the right-sided tumor, there are several small hyperechoic foci of approximately 1 cm, potentially additional [place] foci versus regenerative nodules.   Cirrhosis and splenomegaly   01/15/2021 Imaging   CT Chest  IMPRESSION: 1. No evidence of metastatic disease in the chest. 2. Cirrhotic morphology of the liver with ill-defined, heterogeneous mass of the right lobe of the liver and occlusion of the right portal vein, in keeping with known hepatocellular carcinoma, better demonstrated by recent MR.   02/09/2021 Initial  Diagnosis   Hepatocellular carcinoma ([place])   02/09/2021 Cancer Staging   Staging form: Liver, AJCC 8th Edition - Clinical stage from 02/09/2021: Stage IIIA (cT3, cN0, cM0) - Signed by Truitt Merle, MD on 02/09/2021 Stage prefix: Initial diagnosis    05/29/2021 Imaging   MRI Abdomen  IMPRESSION: 1. Interval decrease in size of the infiltrating mass in the right hepatic lobe showing heterogeneous arterial phase hyperenhancement. Persistent arterial phase enhancement can be seen early post y 80 embolization. No substantial restricted diffusion in the lesion on today's study. 2. Tiny focus of arterial phase hyperenhancement in the dome of the liver near the junction of segment IV and VIII. Unclear whether this was present previously given the marked motion degradation on the earlier study. Close attention on follow-up recommended. 3. 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 Subtraction postcontrast imaging suggests that there may be some enhancement in the thrombus suggesting tumor thrombus or a combination of tumor and bland thrombus. 4. Interval increase in size of the hepatoduodenal ligament lymph node measuring 2.0 x 1.7 cm. Likely metastatic disease,  continued close attention on follow-up recommended. 5. Markedly distended urinary bladder.   06/29/2021 -  Chemotherapy   Patient is on Treatment Plan : LUNG Atezolizumab + Bevacizumab q21d Maintenance        INTERVAL HISTORY:  Jim Little is here for a follow up of [place]. He was last seen by me on 11/01/21. He presents to the clinic accompanied by an interpreter. He reports  today he has been without electricity for 3 weeks. On chart review, there is a note from his congregational nurse [name] stating she is working with the landlord to have the issues resolved.   All other systems were reviewed with the  patient and are negative.  MEDICAL HISTORY:  Past Medical History:  Diagnosis Date   BPH (benign prostatic hyperplasia)    Cirrhosis ([place])    Hypertension    Splenomegaly     SURGICAL HISTORY: Past Surgical History:  Procedure Laterality Date   BIOPSY  08/22/2020   Procedure: BIOPSY;  Surgeon: Ronnette Juniper, MD;  Location: [facility];  Service: Gastroenterology;;   COLONOSCOPY WITH PROPOFOL N/A 08/22/2020   Procedure: COLONOSCOPY WITH PROPOFOL;  Surgeon: Ronnette Juniper, MD;  Location: [facility];  Service: Gastroenterology;  Laterality: N/A;   ESOPHAGOGASTRODUODENOSCOPY (EGD) WITH PROPOFOL N/A 08/22/2020   Procedure: ESOPHAGOGASTRODUODENOSCOPY (EGD) WITH PROPOFOL;  Surgeon: Ronnette Juniper, MD;  Location: [facility];  Service: Gastroenterology;  Laterality: N/A;   IR ANGIOGRAM SELECTIVE EACH ADDITIONAL VESSEL  03/01/2021   IR ANGIOGRAM SELECTIVE EACH ADDITIONAL VESSEL  03/08/2021   IR ANGIOGRAM VISCERAL SELECTIVE  03/01/2021   IR ANGIOGRAM VISCERAL SELECTIVE  03/01/2021   IR ANGIOGRAM VISCERAL SELECTIVE  03/08/2021   IR EMBO TUMOR ORGAN ISCHEMIA INFARCT INC GUIDE ROADMAPPING  03/08/2021   IR RADIOLOGIST EVAL & MGMT  02/06/2021   IR RADIOLOGIST EVAL & MGMT  05/31/2021   IR US GUIDE VASC ACCESS RIGHT  03/01/2021   IR US GUIDE VASC ACCESS RIGHT  03/08/2021   UPPER GASTROINTESTINAL ENDOSCOPY  03/2018   [place] --candidiasis plus esophageal varices grade 2.  Treated with fluconazole and status post esophageal banding.   US ECHOCARDIOGRAPHY  10/2016   per overseas records from [place] - LVEF 68%; mildly calcified aortic valve, normal function otherwise    I have reviewed the social history and family history with the patient and they are unchanged from previous note.  ALLERGIES:  is allergic to pork-derived products.  MEDICATIONS:  Current Outpatient Medications  Medication Sig Dispense Refill   acetaminophen (TYLENOL) 500 MG tablet Take 500 mg by mouth 2 (two) times daily as needed for  mild pain.     amLODipine (NORVASC) 5 MG tablet Take 1 tablet (5 mg total) by mouth daily. 90 tablet 3   lactulose (KRISTALOSE) 10 g packet TAKE 1 PACKET (10 G TOTAL) BY MOUTH AS NEEDED (CONSTIPATION). DISSOLVE IN EIGHT OUNCES OF WATER 30 each 1   levothyroxine (SYNTHROID) 25 MCG tablet Take 3 tablets (75 mcg total) by mouth daily before breakfast. 90 tablet 1   Olopatadine HCl (PAZEO) 0.7 % SOLN Use once daily in both eyes 2.5 mL 0   omeprazole (PRILOSEC) 40 MG capsule Take 1 capsule (40 mg total) by mouth daily. 90 capsule 3   tamsulosin (FLOMAX) 0.4 MG CAPS capsule Take 1 capsule (0.4 mg total) by mouth daily. Please pack and mail to home 90 capsule 3   traMADol (ULTRAM) 50 MG tablet Take 1 tablet (50 mg total) by mouth every 12 (twelve) hours as needed for severe pain. 15 tablet 0   No current facility-administered medications for this visit.   Facility-Administered Medications Ordered in Other Visits  Medication Dose Route Frequency Provider Last Rate Last Admin   sodium chloride flush (NS) 0.9 % injection 10 mL  10 mL Intracatheter PRN Truitt Merle, MD   10 mL at 09/19/21 1604    PHYSICAL EXAMINATION: ECOG PERFORMANCE STATUS: 1 - Symptomatic but completely  ambulatory  Vitals:   11/22/21 1110  BP: (!) 155/81  Pulse: 78  Resp: 18  Temp: 98.5 F (36.9 C)  SpO2: 100%   Wt Readings from Last 3 Encounters:  11/22/21 148 lb 12.8 oz (67.5 kg)  11/01/21 148 lb 8 oz (67.4 kg)  10/10/21 142 lb 9.6 oz (64.7 kg)     GENERAL:alert, no distress and comfortable SKIN: skin color normal, no rashes or significant lesions EYES: normal, Conjunctiva are pink and non-injected, sclera clear  NEURO: alert & oriented x 3 with fluent speech  LABORATORY DATA:  I have reviewed the data as listed CBC Latest Ref Rng & Units 11/22/2021 11/01/2021 10/10/2021  WBC 4.0 - 10.5 K/uL 5.9 5.2 5.4  Hemoglobin 13.0 - 17.0 g/dL 13.6 13.2 13.6  Hematocrit 39.0 - 52.0 % 40.6 39.3 39.6  Platelets 150 - 400 K/uL  125(L) 142(L) 141(L)     CMP Latest Ref Rng & Units 11/22/2021 11/01/2021 10/10/2021  Glucose 70 - 99 mg/dL 108(H) 104(H) 134(H)  BUN 8 - 23 mg/dL 16 13 11   Creatinine 0.61 - 1.24 mg/dL 0.88 0.99 0.93  Sodium 135 - 145 mmol/L 138 137 137  Potassium 3.5 - 5.1 mmol/L 4.0 4.1 4.1  Chloride 98 - 111 mmol/L 106 105 106  CO2 22 - 32 mmol/L 26 23 24   Calcium 8.9 - 10.3 mg/dL 9.4 8.7(L) 8.9  Total Protein 6.5 - 8.1 g/dL 9.0(H) 8.7(H) 8.7(H)  Total Bilirubin 0.3 - 1.2 mg/dL 0.8 0.8 0.6  Alkaline Phos 38 - 126 U/L 177(H) 183(H) 153(H)  AST 15 - 41 U/L 67(H) 69(H) 44(H)  ALT 0 - 44 U/L 46(H) 53(H) 31      RADIOGRAPHIC STUDIES: I have personally reviewed the radiological images as listed and agreed with the findings in the report. No results found.    No orders of the defined types were placed in this encounter.  All questions were answered. The patient knows to call the clinic with any problems, questions or concerns. No barriers to learning was detected. The total time spent in the appointment was 30 minutes.     Truitt Merle, MD 11/22/2021   I, Wilburn Mylar, am acting as scribe for Truitt Merle, MD.   I have reviewed the above documentation for accuracy and completeness, and I agree with the above.

## 2021-11-22 NOTE — Patient Instructions (Signed)
[facility] ONCOL[facility]   Discharge Instructions: Thank you for choosing [facility] to provide your oncology and hematology care.   If you have a lab appointment with the [facility], please go directly to the [facility] and check in at the registration area.   Wear comfortable clothing and clothing appropriate for easy access to any Portacath or PICC line.   We strive to give you quality time with your provider. You may need to reschedule your appointment if you arrive late (15 or more minutes).  Arriving late affects you and other patients whose appointments are after yours.  Also, if you miss three or more appointments without notifying the office, you may be dismissed from the clinic at the providers discretion.      For prescription refill requests, have your pharmacy contact our office and allow 72 hours for refills to be completed.    Today you received the following chemotherapy and/or immunotherapy agents: atezolizumab and bevacizumab-bvzr      To help prevent nausea and vomiting after your treatment, we encourage you to take your nausea medication as directed.  BELOW ARE SYMPTOMS THAT SHOULD BE REPORTED IMMEDIATELY: *FEVER GREATER THAN 100.4 F (38 C) OR HIGHER *CHILLS OR SWEATING *NAUSEA AND VOMITING THAT IS NOT CONTROLLED WITH YOUR NAUSEA MEDICATION *UNUSUAL SHORTNESS OF BREATH *UNUSUAL BRUISING OR BLEEDING *URINARY PROBLEMS (pain or burning when urinating, or frequent urination) *BOWEL PROBLEMS (unusual diarrhea, constipation, pain near the anus) TENDERNESS IN MOUTH AND THROAT WITH OR WITHOUT PRESENCE OF ULCERS (sore throat, sores in mouth, or a toothache) UNUSUAL RASH, SWELLING OR PAIN  UNUSUAL VAGINAL DISCHARGE OR ITCHING   Items with * indicate a potential emergency and should be followed up as soon as possible or go to the Emergency Department if any problems should occur.  Please show the CHEMOTHERAPY ALERT CARD or IMMUNOTHERAPY  ALERT CARD at check-in to the Emergency Department and triage nurse.  Should you have questions after your visit or need to cancel or reschedule your appointment, please contact [facility]  Dept: [phone]  and follow the prompts.  Office hours are 8:00 a.m. to 4:30 p.m. Monday - Friday. Please note that voicemails left after 4:00 p.m. may not be returned until the following business day.  We are closed weekends and major holidays. You have access to a nurse at all times for urgent questions. Please call the main number to the clinic Dept: [phone] and follow the prompts.   For any non-urgent questions, you may also contact your provider using MyChart. We now offer e-Visits for anyone 39 and older to request care online for non-urgent symptoms. For details visit [email or web address].   Also download the MyChart app! Go to the app store, search "MyChart", open the app, select [facility], and log in with your MyChart username and password.  Due to Covid, a mask is required upon entering the hospital/clinic. If you do not have a mask, one will be given to you upon arrival. For doctor visits, patients may have 1 support person aged 6 or older with them. For treatment visits, patients cannot have anyone with them due to current Covid guidelines and our immunocompromised population.

## 2021-11-22 NOTE — Telephone Encounter (Signed)
Patient instructed to take Synthroid 75 mg (3 pills) daily in the mornings before breakfast.This is per Dr Evorn Gong orders.Instructions given in Swahili. Patient and spouse verbalized understanding.  [name] Tajana Crotteau RN BSn [place] [phone]

## 2021-11-22 NOTE — Congregational Nurse Program (Signed)
°  Dept: [facility] Nurse Program Note  Date of Encounter: 11/21/21 1830 hrs  Past Medical History: Past Medical History:  Diagnosis Date   BPH (benign prostatic hyperplasia)    Cirrhosis ([place])    Hypertension    Splenomegaly     Encounter Details:  Eye drops Olopatadine Hydrochloride 0.2% delivered to home and handed over to patient spouse. Unable to see patient who was keeping warm in bed. Family has been without heat throughout the house and electricity in some rooms for 2 days. I am in communication with the landlord and [employer] Specialist (for the young kids)[employer]. A work order is in place for immediate repairs. This has been an ongoing issue since begging of winter.Repairs are done but the problem reoccurs after a few days. CPS has an open case has been coming to the residence. I will follow up with the landlord today and consider involving adult protective services if heat and electricity is not back by end of today.  [name] Myka Hitz RN BSn [place] [phone]

## 2021-11-22 NOTE — Telephone Encounter (Signed)
TSH has returned still elevated after approximately 5 weeks of therapy at 50 mcg.  Increase dose to 75 mcg.  Called [facility] to cancel prior prescription.  New prescription sent to be mailed to her house.  Discussed with the pharmacist myself.

## 2021-11-23 ENCOUNTER — Telehealth: Payer: Self-pay | Admitting: Hematology

## 2021-11-23 LAB — AFP TUMOR MARKER: AFP, Serum, Tumor Marker: 4927 ng/mL — ABNORMAL HIGH (ref 0.0–8.4)

## 2021-11-23 NOTE — Telephone Encounter (Signed)
Scheduled follow-up appointments per 12/29 los. Patient is aware. 

## 2021-11-24 ENCOUNTER — Telehealth: Payer: Self-pay

## 2021-11-24 NOTE — Telephone Encounter (Signed)
Contacted patient to check on housing situation. Heat and electricity was repaired today but only was functional for a few hours and went out again.I will call the leasing company. Family unwilling to seek  shelter else where at this time. I will continue to follow up,  Honor Loh RN BSn [place] [phone]

## 2021-11-26 ENCOUNTER — Telehealth: Payer: Self-pay

## 2021-11-26 NOTE — Telephone Encounter (Signed)
Patient wife called while repairs to restore electricity and heat were ongoing. I talked to the gentle man doing the repairs, he stated that the problem is electrical and it will need an Clinical biochemist. Electrician will be dispatched as soon as possible.I provided him with my cell number to forward it to the electrician. I will follow up with the landlord as well.  [name] Marlette Curvin RN BSn [place] [phone]

## 2021-11-28 ENCOUNTER — Other Ambulatory Visit: Payer: Self-pay

## 2021-11-28 ENCOUNTER — Ambulatory Visit (HOSPITAL_COMMUNITY)
Admission: RE | Admit: 2021-11-28 | Discharge: 2021-11-28 | Disposition: A | Discharge status: Home / Self Care | Payer: Medicaid Other | Source: Ambulatory Visit | Attending: Hematology | Admitting: Hematology

## 2021-11-28 ENCOUNTER — Telehealth: Payer: Self-pay

## 2021-11-28 DIAGNOSIS — C220 Liver cell carcinoma: Secondary | ICD-10-CM | POA: Insufficient documentation

## 2021-11-28 MED ORDER — IOHEXOL 350 MG/ML SOLN
80.0000 mL | Freq: Once | INTRAVENOUS | Status: AC | PRN
  Administered 2021-11-28: 80 mL via INTRAVENOUS

## 2021-11-28 NOTE — Telephone Encounter (Signed)
Patient called with appointment reminder. Nothing by mouth x 4 hours. Drink first bottle of contrast at 2:30 pm and second at 3:30 pm. Patient verbalized understanding.  [name] Ellwood Steidle RN BSn [place] [phone]

## 2021-12-03 NOTE — Congregational Nurse Program (Signed)
°  Dept: [facility] Nurse Program Note  Date of Encounter: 12/03/2021  Past Medical History: Past Medical History:  Diagnosis Date   BPH (benign prostatic hyperplasia)    Cirrhosis ([place])    Hypertension    Splenomegaly     Encounter Details:  Acute home visit complete. Patient is at home without heat. Patient`s 78 year old son was discharged today from hospital.It was noted that the property manager said  there was heat throughout  the house except 2 back rooms which are for storage.This is not the case. The house is cold without heat.This has been an ongoing problem since November.I have contacted PCP office and [place] housing coalition for further assistance.  [name] Tycho Cheramie RN BSn [place] [phone]

## 2021-12-04 ENCOUNTER — Other Ambulatory Visit: Payer: Self-pay | Admitting: Family Medicine

## 2021-12-04 DIAGNOSIS — Z59819 Housing instability, housed unspecified: Secondary | ICD-10-CM

## 2021-12-04 DIAGNOSIS — C220 Liver cell carcinoma: Secondary | ICD-10-CM

## 2021-12-05 ENCOUNTER — Telehealth: Payer: Self-pay

## 2021-12-05 NOTE — Telephone Encounter (Signed)
Confirmation that the home heating was repaired 1/10.  [name] Willard Madrigal RN BSn [place] [phone]

## 2021-12-11 ENCOUNTER — Telehealth: Payer: Self-pay

## 2021-12-11 NOTE — Progress Notes (Signed)
This encounter was created in error - please disregard.

## 2021-12-11 NOTE — Telephone Encounter (Signed)
Patient contacted with appointment reminder and ride scheduled. Toula Moos, [facility] Nurse

## 2021-12-13 ENCOUNTER — Telehealth: Payer: Self-pay

## 2021-12-13 NOTE — Telephone Encounter (Signed)
Patient called with appointment reminder for 12/14/21. Transportation assistance will be provided.  [name] Ernisha Sorn RN BSn [place] [phone]

## 2021-12-14 ENCOUNTER — Telehealth: Payer: Self-pay

## 2021-12-14 ENCOUNTER — Other Ambulatory Visit: Payer: Self-pay

## 2021-12-14 ENCOUNTER — Inpatient Hospital Stay (HOSPITAL_BASED_OUTPATIENT_CLINIC_OR_DEPARTMENT_OTHER): Payer: Medicaid Other | Admitting: Hematology

## 2021-12-14 ENCOUNTER — Inpatient Hospital Stay: Payer: Medicaid Other

## 2021-12-14 ENCOUNTER — Inpatient Hospital Stay: Payer: Medicaid Other | Attending: Hematology

## 2021-12-14 VITALS — BP 190/81 | HR 70 | Temp 98.5°F | Resp 16 | Ht 66.0 in | Wt 149.2 lb

## 2021-12-14 VITALS — BP 183/98

## 2021-12-14 DIAGNOSIS — Z79899 Other long term (current) drug therapy: Secondary | ICD-10-CM | POA: Diagnosis not present

## 2021-12-14 DIAGNOSIS — C220 Liver cell carcinoma: Secondary | ICD-10-CM | POA: Diagnosis not present

## 2021-12-14 DIAGNOSIS — Z5112 Encounter for antineoplastic immunotherapy: Secondary | ICD-10-CM | POA: Insufficient documentation

## 2021-12-14 DIAGNOSIS — E039 Hypothyroidism, unspecified: Secondary | ICD-10-CM

## 2021-12-14 LAB — CBC WITH DIFFERENTIAL (CANCER CENTER ONLY)
Abs Immature Granulocytes: 0.01 10*3/uL (ref 0.00–0.07)
Basophils Absolute: 0 10*3/uL (ref 0.0–0.1)
Basophils Relative: 0 %
Eosinophils Absolute: 0.2 10*3/uL (ref 0.0–0.5)
Eosinophils Relative: 4 %
HCT: 38.4 % — ABNORMAL LOW (ref 39.0–52.0)
Hemoglobin: 13.1 g/dL (ref 13.0–17.0)
Immature Granulocytes: 0 %
Lymphocytes Relative: 24 %
Lymphs Abs: 1.1 10*3/uL (ref 0.7–4.0)
MCH: 34.4 pg — ABNORMAL HIGH (ref 26.0–34.0)
MCHC: 34.1 g/dL (ref 30.0–36.0)
MCV: 100.8 fL — ABNORMAL HIGH (ref 80.0–100.0)
Monocytes Absolute: 0.6 10*3/uL (ref 0.1–1.0)
Monocytes Relative: 12 %
Neutro Abs: 2.9 10*3/uL (ref 1.7–7.7)
Neutrophils Relative %: 60 %
Platelet Count: 128 10*3/uL — ABNORMAL LOW (ref 150–400)
RBC: 3.81 MIL/uL — ABNORMAL LOW (ref 4.22–5.81)
RDW: 15.8 % — ABNORMAL HIGH (ref 11.5–15.5)
WBC Count: 4.8 10*3/uL (ref 4.0–10.5)
nRBC: 0 % (ref 0.0–0.2)

## 2021-12-14 LAB — CMP (CANCER CENTER ONLY)
ALT: 38 U/L (ref 0–44)
AST: 60 U/L — ABNORMAL HIGH (ref 15–41)
Albumin: 3.8 g/dL (ref 3.5–5.0)
Alkaline Phosphatase: 159 U/L — ABNORMAL HIGH (ref 38–126)
Anion gap: 6 (ref 5–15)
BUN: 13 mg/dL (ref 8–23)
CO2: 25 mmol/L (ref 22–32)
Calcium: 9.5 mg/dL (ref 8.9–10.3)
Chloride: 105 mmol/L (ref 98–111)
Creatinine: 0.9 mg/dL (ref 0.61–1.24)
GFR, Estimated: >60 mL/min (ref >60)
Glucose, Bld: 90 mg/dL (ref 70–99)
Potassium: 4.2 mmol/L (ref 3.5–5.1)
Sodium: 136 mmol/L (ref 135–145)
Total Bilirubin: 0.9 mg/dL (ref 0.3–1.2)
Total Protein: 8.7 g/dL — ABNORMAL HIGH (ref 6.5–8.1)

## 2021-12-14 LAB — TSH: TSH: 29.092 u[IU]/mL — ABNORMAL HIGH (ref 0.320–4.118)

## 2021-12-14 LAB — TOTAL PROTEIN, URINE DIPSTICK: Protein, ur: 100 mg/dL — AB

## 2021-12-14 LAB — T4, FREE: Free T4: 0.46 ng/dL — ABNORMAL LOW (ref 0.61–1.12)

## 2021-12-14 MED ORDER — SODIUM CHLORIDE 0.9 % IV SOLN: Freq: Once | INTRAVENOUS | Status: AC

## 2021-12-14 MED ORDER — SODIUM CHLORIDE 0.9 % IV SOLN
15.0000 mg/kg | Freq: Once | INTRAVENOUS | Status: AC
  Administered 2021-12-14: 1000 mg via INTRAVENOUS
  Filled 2021-12-14: qty 32

## 2021-12-14 MED ORDER — SODIUM CHLORIDE 0.9 % IV SOLN
1200.0000 mg | Freq: Once | INTRAVENOUS | Status: AC
  Administered 2021-12-14: 1200 mg via INTRAVENOUS
  Filled 2021-12-14: qty 20

## 2021-12-14 NOTE — Progress Notes (Signed)
Per Dr. Burr Medico, "OK To Treat w/elevated BP".  EKG done while pt is in infusion.

## 2021-12-14 NOTE — Progress Notes (Signed)
[place]   Telephone:([phone] Fax:(336) [phone]   Clinic Follow up Note   Patient Care Team: [name], MD as PCP - General (Family Medicine) Jonnie Finner, RN (Inactive) as Oncology Nurse Navigator Heilingoetter, Tobe Sos, PA-C as Physician Assistant (Physician Assistant) Truitt Merle, MD as Consulting Physician (Oncology)  Date of Service:  12/14/2021  CHIEF COMPLAINT: f/u of [facility]  CURRENT THERAPY:  Tecentriq and Avastin, q3weeks, starting 06/29/21  ASSESSMENT & PLAN:  Jim Little is a 76 y.o. male with   1. Hepatocellular Carcinoma, unresectable, stage IIIA, with node metastasis  -Based on MRI liver 01/12/21 with Advanced cirrhotic changes involving the liver and Large infiltrating mass versus numerous confluent masses involving the right hepatic lobe. This is consistent with hepatocellular carcinoma. Associated thrombosed middle and portal veins. No definite left hepatic lobe lesions. AFP (01/14/21) 11,904.0. -His 01/15/21 CT of the chest did not show metastatic disease to the lungs. Staging workup completed. -He has unclear etiology of cirrhosis, which is followed by Dr. Therisa Doyne. Hep B surface antigen non reactive (05/15/20), ANA negative, Hep C ab non reactive. No alcohol abuse. -He is s/p Y 90 radioembolization on 03/08/21. His AFP has dropped significantly after Y90 -MRI scan from 05/29/21 showed good response in liver, but he developed hepatoduodenal ligament lymph node measuring 2.0 x 1.7 cm, likely metastatic disease   -given his extrahepatic metastasis, I recommend systemic treatment Tecentriq and Avastin. Pt has not been compliant with oral medicine, oral TKI will be challenge for him. He had a EGD in 07/2020, showed no significant varices, okay to proceed Avastin -He started Tecentriq and Avastin on 06/29/21. He has tolerated relatively well, with constipation. -CT AP on 11/28/21 showed: no convincing evidence of residual or recurrent enhancement; no new  enhancement in liver; similar enlarged portacaval lymph node. -He is clinically doing well. Lab reviewed, adequate for treatment, will proceed cycle 8 today -His AFP has been rising lately, concerning for cancer progression. Due to his chest pain, I will get a CT chest to rule out thoracic metastasis   2. Symptom Management: Constipation, Central chest pain -constipation improved, he has lactulose.  -he reports a constant chest pain, rated about 5/10. I will order chest CT to rule out disease (last chest CT was 06/2018)   3. Scrotal swelling -developed following Y90 treatment -he is followed by [facility] for history of urinary retention and brought this to Dr. Keane Scrape attention on 04/30/21.  -his most recent scrotal US from 11/02/21, showed left epididymis enlargement, may be due to epididymitis, and extensive microlithiasis   4. Social Support, Language barrier -From the [place], was at a refugee camp in [place] -Has several family members living in single house. 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 Neither he nor his family speak [place]  -He speaks Swahili. He has [facility] [employer] [facility], Honor Loh, who helps him. Her contact is [phone]   5. Portal vein thrombosis, Cirrhosis  -Seen with direct admission from 01/13/21-01/16/21 -Patient received heparin drip inpatient. Dr. Marin Olp did not recommend anticoagulation since the thrombus is tumor thrombus. -His liver cirrhosis is followed by Dr. Therisa Doyne in GI. Prior work up with EGD/Colonoscopy in 07/2020 which showed no varices      PLAN: -proceed with C8 Tecentriq and Avastin today -CT chest in 2 weeks -EKG done today, which showed LVH and related ST-T change, no other concerns -lab, flush, f/u, and C9 in 3 weeks  No problem-specific Assessment & Plan notes found for this encounter.   SUMMARY OF ONCOLOGIC HISTORY: Oncology History Overview Note  Cancer  Staging Hepatocellular carcinoma [facility]) Staging form: Liver, AJCC 8th Edition - Clinical stage from 02/09/2021: Stage IIIA (cT3, cN0, cM0) - Signed by Truitt Merle, MD on 02/09/2021 Stage prefix: Initial diagnosis    Hepatocellular carcinoma ([place])  05/12/2020 Imaging   MRI Liver  IMPRESSION: 1. Geographic lesion in the inferior medial aspect of the RIGHT hepatic lobe has some imaging features suggesting focal fatty infiltration. However, atypical enhancement pattern. Recommend follow-up contrast MRI in 3 to 6 months to re-evaluate. 2. No additional lesion liver. 3. Morphologic changes consistent with cirrhosis.   01/12/2021 Imaging   MRI Abdomen  IMPRESSION: 1. Advanced cirrhotic changes involving the liver. There is a large infiltrating mass versus numerous confluent masses involving the right hepatic lobe and consistent with hepatocellular carcinoma. Associated thrombosed middle and portal veins. No definite left hepatic lobe lesions. LI-RADS 5. 2. Borderline splenomegaly. 3. No ascites or abdominal wall hernia.     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 The ultrasound correlate of the previously demonstrated right liver tumor is heterogeneous tissue with ill-defined margin.   In addition to the right-sided tumor, there are several small hyperechoic foci of approximately 1 cm, potentially additional Calhoun foci versus regenerative nodules.   Cirrhosis and splenomegaly   01/15/2021 Imaging   CT Chest  IMPRESSION: 1. No evidence of metastatic disease in the chest. 2. Cirrhotic morphology of the liver with ill-defined, heterogeneous mass of the right lobe of the liver and occlusion of the right portal vein, in keeping with known hepatocellular carcinoma, better demonstrated by recent MR.    02/09/2021 Initial Diagnosis   Hepatocellular carcinoma ([place])   02/09/2021 Cancer Staging   Staging form: Liver, AJCC 8th Edition - Clinical stage from 02/09/2021: Stage IIIA (cT3, cN0, cM0) - Signed by Truitt Merle, MD on 02/09/2021 Stage prefix: Initial diagnosis    05/29/2021 Imaging   MRI Abdomen  IMPRESSION: 1. Interval decrease in size of the infiltrating mass in the right hepatic lobe showing heterogeneous arterial phase hyperenhancement. Persistent arterial phase enhancement can be seen early post y 15 embolization. No substantial restricted diffusion in the lesion on today's study. 2. Tiny focus of arterial phase hyperenhancement in the dome of the liver near the junction of segment IV and VIII. Unclear whether this was present previously given the marked motion degradation on the earlier study. Close attention on follow-up recommended. 3. 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 Subtraction postcontrast imaging suggests that there may be some enhancement in the thrombus suggesting tumor thrombus or a combination of tumor and bland thrombus. 4. Interval increase in size of the hepatoduodenal ligament lymph node measuring 2.0 x 1.7 cm. Likely metastatic disease,  continued close attention on follow-up recommended. 5. Markedly distended urinary bladder.   06/29/2021 -  Chemotherapy   Patient is on Treatment Plan : LUNG Atezolizumab + Bevacizumab q21d Maintenance        INTERVAL HISTORY:  Georgios Kina is here for a follow up of [place]. He was last seen by me on 11/22/21. He presents to the clinic accompanied by  an interpreter. He reports mild chest pain today. He describes it as a constant, kind of stabbing-type pain and rates it about 5/10. He denies anything that makes the pain worse or better. He notes this is the only area of pain; his prior pain  has essentially resolved.   All other systems were reviewed with the patient and are negative.  MEDICAL HISTORY:  Past Medical History:  Diagnosis Date   BPH (benign prostatic hyperplasia)    Cirrhosis ([place])    Hypertension    Splenomegaly     SURGICAL HISTORY: Past Surgical History:  Procedure Laterality Date   BIOPSY  08/22/2020   Procedure: BIOPSY;  Surgeon: Ronnette Juniper, MD;  Location: [facility];  Service: Gastroenterology;;   COLONOSCOPY WITH PROPOFOL N/A 08/22/2020   Procedure: COLONOSCOPY WITH PROPOFOL;  Surgeon: Ronnette Juniper, MD;  Location: [facility];  Service: Gastroenterology;  Laterality: N/A;   ESOPHAGOGASTRODUODENOSCOPY (EGD) WITH PROPOFOL N/A 08/22/2020   Procedure: ESOPHAGOGASTRODUODENOSCOPY (EGD) WITH PROPOFOL;  Surgeon: Ronnette Juniper, MD;  Location: [facility];  Service: Gastroenterology;  Laterality: N/A;   IR ANGIOGRAM SELECTIVE EACH ADDITIONAL VESSEL  03/01/2021   IR ANGIOGRAM SELECTIVE EACH ADDITIONAL VESSEL  03/08/2021   IR ANGIOGRAM VISCERAL SELECTIVE  03/01/2021   IR ANGIOGRAM VISCERAL SELECTIVE  03/01/2021   IR ANGIOGRAM VISCERAL SELECTIVE  03/08/2021   IR EMBO TUMOR ORGAN ISCHEMIA INFARCT INC GUIDE ROADMAPPING  03/08/2021   IR RADIOLOGIST EVAL & MGMT  02/06/2021   IR RADIOLOGIST EVAL & MGMT  05/31/2021   IR US GUIDE VASC ACCESS RIGHT  03/01/2021   IR US GUIDE VASC ACCESS RIGHT  03/08/2021   UPPER GASTROINTESTINAL ENDOSCOPY  03/2018   [place] --candidiasis plus esophageal varices grade 2.  Treated with fluconazole and status post esophageal banding.   US ECHOCARDIOGRAPHY  10/2016   per overseas records from [place] - LVEF 68%; mildly calcified aortic valve, normal function otherwise    I have reviewed the social history and family history with the patient and they are unchanged from previous note.  ALLERGIES:  is allergic to pork-derived products.  MEDICATIONS:  Current Outpatient Medications  Medication Sig Dispense Refill   acetaminophen (TYLENOL)  500 MG tablet Take 500 mg by mouth 2 (two) times daily as needed for mild pain.     amLODipine (NORVASC) 5 MG tablet Take 1 tablet (5 mg total) by mouth daily. 90 tablet 3   lactulose (KRISTALOSE) 10 g packet TAKE 1 PACKET (10 G TOTAL) BY MOUTH AS NEEDED (CONSTIPATION). DISSOLVE IN EIGHT OUNCES OF WATER 30 each 1   levothyroxine (SYNTHROID) 25 MCG tablet Take 3 tablets (75 mcg total) by mouth daily before breakfast. 90 tablet 1   Olopatadine HCl (PAZEO) 0.7 % SOLN Use once daily in both eyes 2.5 mL 0   omeprazole (PRILOSEC) 40 MG capsule Take 1 capsule (40 mg total) by mouth daily. 90 capsule 3   tamsulosin (FLOMAX) 0.4 MG CAPS capsule Take 1 capsule (0.4 mg total) by mouth daily. Please pack and mail to home 90 capsule 3   traMADol (ULTRAM) 50 MG tablet Take 1 tablet (50 mg total) by mouth every 12 (twelve) hours as needed for severe pain. 15 tablet 0   No current facility-administered medications for this visit.   Facility-Administered Medications Ordered in Other Visits  Medication Dose Route Frequency Provider Last Rate Last Admin   sodium chloride flush (NS) 0.9 % injection 10 mL  10 mL Intracatheter PRN Truitt Merle, MD   10 mL at  09/19/21 1604    PHYSICAL EXAMINATION: ECOG PERFORMANCE STATUS: 1 - Symptomatic but completely ambulatory  There were no vitals filed for this visit. Wt Readings from Last 3 Encounters:  11/22/21 148 lb 12.8 oz (67.5 kg)  11/01/21 148 lb 8 oz (67.4 kg)  10/10/21 142 lb 9.6 oz (64.7 kg)     GENERAL:alert, no distress and comfortable SKIN: skin color normal, no rashes or significant lesions EYES: normal, Conjunctiva are pink and non-injected, sclera clear  LUNGS: clear to auscultation and percussion with normal breathing effort HEART: regular rate & rhythm and no murmurs and no lower extremity edema NEURO: alert & oriented x 3 with fluent speech  LABORATORY DATA:  I have reviewed the data as listed CBC Latest Ref Rng & Units 12/14/2021 11/22/2021  11/01/2021  WBC 4.0 - 10.5 K/uL 4.8 5.9 5.2  Hemoglobin 13.0 - 17.0 g/dL 13.1 13.6 13.2  Hematocrit 39.0 - 52.0 % 38.4(L) 40.6 39.3  Platelets 150 - 400 K/uL 128(L) 125(L) 142(L)     CMP Latest Ref Rng & Units 11/22/2021 11/01/2021 10/10/2021  Glucose 70 - 99 mg/dL 108(H) 104(H) 134(H)  BUN 8 - 23 mg/dL 16 13 11   Creatinine 0.61 - 1.24 mg/dL 0.88 0.99 0.93  Sodium 135 - 145 mmol/L 138 137 137  Potassium 3.5 - 5.1 mmol/L 4.0 4.1 4.1  Chloride 98 - 111 mmol/L 106 105 106  CO2 22 - 32 mmol/L 26 23 24   Calcium 8.9 - 10.3 mg/dL 9.4 8.7(L) 8.9  Total Protein 6.5 - 8.1 g/dL 9.0(H) 8.7(H) 8.7(H)  Total Bilirubin 0.3 - 1.2 mg/dL 0.8 0.8 0.6  Alkaline Phos 38 - 126 U/L 177(H) 183(H) 153(H)  AST 15 - 41 U/L 67(H) 69(H) 44(H)  ALT 0 - 44 U/L 46(H) 53(H) 31      RADIOGRAPHIC STUDIES: I have personally reviewed the radiological images as listed and agreed with the findings in the report. No results found.    No orders of the defined types were placed in this encounter.  All questions were answered. The patient knows to call the clinic with any problems, questions or concerns. No barriers to learning was detected. The total time spent in the appointment was 30 minutes.     Truitt Merle, MD 12/14/2021   I, Wilburn Mylar, am acting as scribe for Truitt Merle, MD.   I have reviewed the above documentation for accuracy and completeness, and I agree with the above.

## 2021-12-14 NOTE — Patient Instructions (Signed)
[place] [facility] ONCOLOGY  Discharge Instructions: Thank you for choosing [facility] to provide your oncology and hematology care.   If you have a lab appointment with the [facility], please go directly to the [facility] and check in at the registration area.   Wear comfortable clothing and clothing appropriate for easy access to any Portacath or PICC line.   We strive to give you quality time with your provider. You may need to reschedule your appointment if you arrive late (15 or more minutes).  Arriving late affects you and other patients whose appointments are after yours.  Also, if you miss three or more appointments without notifying the office, you may be dismissed from the clinic at the providers discretion.      For prescription refill requests, have your pharmacy contact our office and allow 72 hours for refills to be completed.    Today you received the following chemotherapy and/or immunotherapy agents: Tecentriq & Noah Charon      To help prevent nausea and vomiting after your treatment, we encourage you to take your nausea medication as directed.  BELOW ARE SYMPTOMS THAT SHOULD BE REPORTED IMMEDIATELY: *FEVER GREATER THAN 100.4 F (38 C) OR HIGHER *CHILLS OR SWEATING *NAUSEA AND VOMITING THAT IS NOT CONTROLLED WITH YOUR NAUSEA MEDICATION *UNUSUAL SHORTNESS OF BREATH *UNUSUAL BRUISING OR BLEEDING *URINARY PROBLEMS (pain or burning when urinating, or frequent urination) *BOWEL PROBLEMS (unusual diarrhea, constipation, pain near the anus) TENDERNESS IN MOUTH AND THROAT WITH OR WITHOUT PRESENCE OF ULCERS (sore throat, sores in mouth, or a toothache) UNUSUAL RASH, SWELLING OR PAIN  UNUSUAL VAGINAL DISCHARGE OR ITCHING   Items with * indicate a potential emergency and should be followed up as soon as possible or go to the Emergency Department if any problems should occur.  Please show the CHEMOTHERAPY ALERT CARD or IMMUNOTHERAPY ALERT CARD at  check-in to the Emergency Department and triage nurse.  Should you have questions after your visit or need to cancel or reschedule your appointment, please contact [facility]  Dept: [phone]  and follow the prompts.  Office hours are 8:00 a.m. to 4:30 p.m. Monday - Friday. Please note that voicemails left after 4:00 p.m. may not be returned until the following business day.  We are closed weekends and major holidays. You have access to a nurse at all times for urgent questions. Please call the main number to the clinic Dept: [phone] and follow the prompts.   For any non-urgent questions, you may also contact your provider using MyChart. We now offer e-Visits for anyone 78 and older to request care online for non-urgent symptoms. For details visit [email or web address].   Also download the MyChart app! Go to the app store, search "MyChart", open the app, select [facility], and log in with your MyChart username and password.  Due to Covid, a mask is required upon entering the hospital/clinic. If you do not have a mask, one will be given to you upon arrival. For doctor visits, patients may have 1 support person aged 78 or older with them. For treatment visits, patients cannot have anyone with them due to current Covid guidelines and our immunocompromised population.

## 2021-12-14 NOTE — Telephone Encounter (Signed)
Patient missed scheduled ride. New ride scheduled. Please always [facility] RN phone number to schedule ride [phone].  If  patient telephone number is used, he does not confirmed his rides and therefore rides are cancelled.

## 2021-12-15 ENCOUNTER — Telehealth: Payer: Self-pay

## 2021-12-15 ENCOUNTER — Encounter: Payer: Self-pay | Admitting: Hematology

## 2021-12-15 LAB — AFP TUMOR MARKER: AFP, Serum, Tumor Marker: 6428 ng/mL — ABNORMAL HIGH (ref 0.0–8.4)

## 2021-12-15 NOTE — Telephone Encounter (Signed)
Blood pressure was elevated during yesterday`s hospital visit. Contacted patient,he has not taken his medication for 2 days. He stated  "I`m taking a break". Education provided on compliance. Advised to resume all his medication as ordered. Called pharmacy to refill synthroid. Synthroid will be delivered on Monday.  [name] Kinslei Labine RN [facility] [place] [phone]

## 2021-12-27 ENCOUNTER — Telehealth: Payer: Self-pay

## 2021-12-27 ENCOUNTER — Encounter: Payer: Self-pay | Admitting: Hematology

## 2021-12-27 NOTE — Progress Notes (Signed)
Pt is approved for the $1000 [employer] grant.  

## 2021-12-27 NOTE — Telephone Encounter (Signed)
Patient called with appointment reminder. Transportation assistance will be provided.  [name] Emery Dupuy RN [facility] [place] [phone]

## 2021-12-28 ENCOUNTER — Ambulatory Visit (HOSPITAL_COMMUNITY)
Admission: RE | Admit: 2021-12-28 | Discharge: 2021-12-28 | Disposition: A | Discharge status: Home / Self Care | Payer: Medicaid Other | Source: Ambulatory Visit | Attending: Hematology | Admitting: Hematology

## 2021-12-28 ENCOUNTER — Other Ambulatory Visit: Payer: Self-pay

## 2021-12-28 DIAGNOSIS — R0789 Other chest pain: Secondary | ICD-10-CM | POA: Insufficient documentation

## 2021-12-28 DIAGNOSIS — I700 Atherosclerosis of aorta: Secondary | ICD-10-CM | POA: Insufficient documentation

## 2021-12-28 DIAGNOSIS — K7460 Unspecified cirrhosis of liver: Secondary | ICD-10-CM | POA: Insufficient documentation

## 2021-12-28 DIAGNOSIS — C220 Liver cell carcinoma: Secondary | ICD-10-CM | POA: Diagnosis present

## 2021-12-28 DIAGNOSIS — R911 Solitary pulmonary nodule: Secondary | ICD-10-CM | POA: Insufficient documentation

## 2021-12-28 MED ORDER — IOHEXOL 300 MG/ML  SOLN
100.0000 mL | Freq: Once | INTRAMUSCULAR | Status: AC | PRN
  Administered 2021-12-28: 75 mL via INTRAVENOUS

## 2021-12-28 MED ORDER — SODIUM CHLORIDE (PF) 0.9 % IJ SOLN
INTRAMUSCULAR | Status: AC
  Filled 2021-12-28: qty 50

## 2022-01-03 ENCOUNTER — Inpatient Hospital Stay: Payer: Medicaid Other

## 2022-01-03 ENCOUNTER — Other Ambulatory Visit: Payer: Self-pay

## 2022-01-03 ENCOUNTER — Inpatient Hospital Stay (HOSPITAL_BASED_OUTPATIENT_CLINIC_OR_DEPARTMENT_OTHER): Payer: Medicaid Other | Admitting: Hematology

## 2022-01-03 ENCOUNTER — Encounter: Payer: Self-pay | Admitting: Hematology

## 2022-01-03 ENCOUNTER — Inpatient Hospital Stay: Payer: Medicaid Other | Attending: Hematology

## 2022-01-03 ENCOUNTER — Telehealth: Payer: Self-pay

## 2022-01-03 VITALS — BP 172/80

## 2022-01-03 VITALS — BP 180/67 | HR 68 | Temp 98.5°F | Resp 18 | Ht 66.0 in | Wt 153.3 lb

## 2022-01-03 DIAGNOSIS — C220 Liver cell carcinoma: Secondary | ICD-10-CM | POA: Insufficient documentation

## 2022-01-03 DIAGNOSIS — E063 Autoimmune thyroiditis: Secondary | ICD-10-CM

## 2022-01-03 DIAGNOSIS — K7460 Unspecified cirrhosis of liver: Secondary | ICD-10-CM | POA: Insufficient documentation

## 2022-01-03 DIAGNOSIS — E032 Hypothyroidism due to medicaments and other exogenous substances: Secondary | ICD-10-CM

## 2022-01-03 DIAGNOSIS — Z5112 Encounter for antineoplastic immunotherapy: Secondary | ICD-10-CM | POA: Insufficient documentation

## 2022-01-03 DIAGNOSIS — I81 Portal vein thrombosis: Secondary | ICD-10-CM | POA: Insufficient documentation

## 2022-01-03 DIAGNOSIS — R300 Dysuria: Secondary | ICD-10-CM

## 2022-01-03 DIAGNOSIS — E039 Hypothyroidism, unspecified: Secondary | ICD-10-CM

## 2022-01-03 LAB — CBC WITH DIFFERENTIAL (CANCER CENTER ONLY)
Abs Immature Granulocytes: 0 10*3/uL (ref 0.00–0.07)
Basophils Absolute: 0 10*3/uL (ref 0.0–0.1)
Basophils Relative: 0 %
Eosinophils Absolute: 0.1 10*3/uL (ref 0.0–0.5)
Eosinophils Relative: 3 %
HCT: 37.8 % — ABNORMAL LOW (ref 39.0–52.0)
Hemoglobin: 13 g/dL (ref 13.0–17.0)
Immature Granulocytes: 0 %
Lymphocytes Relative: 27 %
Lymphs Abs: 0.8 10*3/uL (ref 0.7–4.0)
MCH: 34.3 pg — ABNORMAL HIGH (ref 26.0–34.0)
MCHC: 34.4 g/dL (ref 30.0–36.0)
MCV: 99.7 fL (ref 80.0–100.0)
Monocytes Absolute: 0.6 10*3/uL (ref 0.1–1.0)
Monocytes Relative: 20 %
Neutro Abs: 1.5 10*3/uL — ABNORMAL LOW (ref 1.7–7.7)
Neutrophils Relative %: 50 %
Platelet Count: 110 10*3/uL — ABNORMAL LOW (ref 150–400)
RBC: 3.79 MIL/uL — ABNORMAL LOW (ref 4.22–5.81)
RDW: 14.3 % (ref 11.5–15.5)
WBC Count: 3.1 10*3/uL — ABNORMAL LOW (ref 4.0–10.5)
nRBC: 0 % (ref 0.0–0.2)

## 2022-01-03 LAB — CMP (CANCER CENTER ONLY)
ALT: 45 U/L — ABNORMAL HIGH (ref 0–44)
AST: 74 U/L — ABNORMAL HIGH (ref 15–41)
Albumin: 3.6 g/dL (ref 3.5–5.0)
Alkaline Phosphatase: 165 U/L — ABNORMAL HIGH (ref 38–126)
Anion gap: 3 — ABNORMAL LOW (ref 5–15)
BUN: 15 mg/dL (ref 8–23)
CO2: 27 mmol/L (ref 22–32)
Calcium: 8.9 mg/dL (ref 8.9–10.3)
Chloride: 106 mmol/L (ref 98–111)
Creatinine: 0.87 mg/dL (ref 0.61–1.24)
GFR, Estimated: >60 mL/min (ref >60)
Glucose, Bld: 115 mg/dL — ABNORMAL HIGH (ref 70–99)
Potassium: 3.9 mmol/L (ref 3.5–5.1)
Sodium: 136 mmol/L (ref 135–145)
Total Bilirubin: 0.9 mg/dL (ref 0.3–1.2)
Total Protein: 8.2 g/dL — ABNORMAL HIGH (ref 6.5–8.1)

## 2022-01-03 LAB — T4, FREE: Free T4: 0.49 ng/dL — ABNORMAL LOW (ref 0.61–1.12)

## 2022-01-03 MED ORDER — NITROFURANTOIN MONOHYD MACRO 100 MG PO CAPS: 100.0000 mg | ORAL_CAPSULE | Freq: Two times a day (BID) | ORAL | 0 refills | Status: DC

## 2022-01-03 MED ORDER — SODIUM CHLORIDE 0.9 % IV SOLN
15.0000 mg/kg | Freq: Once | INTRAVENOUS | Status: AC
  Administered 2022-01-03: 1000 mg via INTRAVENOUS
  Filled 2022-01-03: qty 32

## 2022-01-03 MED ORDER — SODIUM CHLORIDE 0.9 % IV SOLN: Freq: Once | INTRAVENOUS | Status: AC

## 2022-01-03 MED ORDER — AMLODIPINE BESYLATE 10 MG PO TABS: 10.0000 mg | ORAL_TABLET | Freq: Every day | ORAL | 0 refills | Status: DC

## 2022-01-03 MED ORDER — SODIUM CHLORIDE 0.9 % IV SOLN
1200.0000 mg | Freq: Once | INTRAVENOUS | Status: AC
  Administered 2022-01-03: 1200 mg via INTRAVENOUS
  Filled 2022-01-03: qty 20

## 2022-01-03 NOTE — Telephone Encounter (Signed)
Patient called with appointment reminder, transportation assistance provided.  [name] Kateryna Grantham RN [facility] [place] [phone]

## 2022-01-03 NOTE — Progress Notes (Signed)
Ok to treat with elevated BP per Dr.Feng  Pt reporting burning with urination, foul odor, and trouble urinating. Dr.Feng made aware. This RN attempted to get a urine sample, pt could not urinate. Pt also declined coming in tomorrow for urine sample d/t transportation issues. MD made aware, antibodies ordered by MD, pt educated on importance of taking them. No further questions or concerns at time of discharge.

## 2022-01-03 NOTE — Progress Notes (Signed)
Jim Little   Telephone:([phone] Fax:([phone]) [phone]   Clinic Follow up Note   Patient Care Team: [name], MD as PCP - General (Family Medicine) Jonnie Finner, RN (Inactive) as Oncology Nurse Navigator Heilingoetter, Tobe Sos, PA-C as Physician Assistant (Physician Assistant) Truitt Merle, MD as Consulting Physician (Oncology)  Date of Service:  01/03/2022  CHIEF COMPLAINT: f/u of [facility]  CURRENT THERAPY:  Tecentriq and Avastin, q3weeks, starting 06/29/21  ASSESSMENT & PLAN:  Jim Little is a 76 y.o. male with   1. Hepatocellular Carcinoma, unresectable, stage IIIA, with node metastasis  -Based on MRI liver 01/12/21 with Advanced cirrhotic changes involving the liver and Large infiltrating mass versus numerous confluent masses involving the right hepatic lobe. This is consistent with hepatocellular carcinoma. Associated thrombosed middle and portal veins. No definite left hepatic lobe lesions. AFP (01/14/21) 11,904.0. -His 01/15/21 CT of the chest did not show metastatic disease to the lungs. Staging workup completed. -He has unclear etiology of cirrhosis, which is followed by Dr. Therisa Doyne. Hep B surface antigen non reactive (05/15/20), ANA negative, Hep C ab non reactive. No alcohol abuse. -He is s/p Y 90 radioembolization on 03/08/21. His AFP has dropped significantly after Y90 -MRI scan from 05/29/21 showed good response in liver, but he developed hepatoduodenal ligament lymph node measuring 2.0 x 1.7 cm, likely metastatic disease   -given his extrahepatic metastasis, I recommend systemic treatment Tecentriq and Avastin. Pt has not been compliant with oral medicine, oral TKI will be challenge for him. He had a EGD in 07/2020, showed no significant varices, okay to proceed Avastin -He started Tecentriq and Avastin on 06/29/21. He has tolerated relatively well, with constipation. -CT AP on 11/28/21 showed: no convincing evidence of residual or recurrent enhancement; no new  enhancement in liver; similar enlarged portacaval lymph node. -His AFP has been rising lately, concerning for cancer progression. Chest CT 12/28/21 was negative.  -He is clinically doing well overall. Lab reviewed, adequate for treatment, will proceed cycle 9 today -plan to repeat CT abdomen in April or May    2. Symptom Management: Constipation, Central chest pain -constipation improved, he has lactulose.  -he reports a constant chest pain, rated about 5/10. Chest CT on 12/28/21 showed no acute findings or metastatic disease. -his BP has been more elevated in the clinic lately. I prescribed amlodipine for him today.   3. Scrotal swelling -developed following Y90 treatment -he is followed by [facility] for history of urinary retention and brought this to Dr. Keane Scrape attention on 04/30/21.  -his most recent scrotal US from 11/02/21, showed left epididymis enlargement, may be due to epididymitis, and extensive microlithiasis   4. Social Support, Language barrier -From [place], was at a refugee camp in [place] -Has several family members living in single house. 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 Neither he nor his family speak [place]  -He speaks Swahili. He has [facility], Honor Loh, who helps him. Her contact is [phone]   5. Portal vein thrombosis, Cirrhosis  -Seen with direct admission from 01/13/21-01/16/21 -Patient received heparin drip inpatient. Dr. Marin Olp did not recommend anticoagulation since the thrombus is tumor thrombus. -His liver cirrhosis is followed by Dr. Therisa Doyne in GI. Prior work up with EGD/Colonoscopy in 07/2020 which showed no varices      PLAN: -proceed with C9 Tecentriq and Avastin today -I called in amlodipine 10mg  daily (increase from 5mg  daily) -lab, flush,  f/u, and C10 in 3 weeks   No problem-specific Assessment & Plan notes found for this encounter.   SUMMARY OF  ONCOLOGIC HISTORY: Oncology History Overview Note  Cancer Staging Hepatocellular carcinoma [facility]) Staging form: Liver, AJCC 8th Edition - Clinical stage from 02/09/2021: Stage IIIA (cT3, cN0, cM0) - Signed by Truitt Merle, MD on 02/09/2021 Stage prefix: Initial diagnosis    Hepatocellular carcinoma ([place])  05/12/2020 Imaging   MRI Liver  IMPRESSION: 1. Geographic lesion in the inferior medial aspect of the RIGHT hepatic lobe has some imaging features suggesting focal fatty infiltration. However, atypical enhancement pattern. Recommend follow-up contrast MRI in 3 to 6 months to re-evaluate. 2. No additional lesion liver. 3. Morphologic changes consistent with cirrhosis.   01/12/2021 Imaging   MRI Abdomen  IMPRESSION: 1. Advanced cirrhotic changes involving the liver. There is a large infiltrating mass versus numerous confluent masses involving the right hepatic lobe and consistent with hepatocellular carcinoma. Associated thrombosed middle and portal veins. No definite left hepatic lobe lesions. LI-RADS 5. 2. Borderline splenomegaly. 3. No ascites or abdominal wall hernia.     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 The ultrasound correlate of the previously demonstrated right liver tumor is heterogeneous tissue with ill-defined margin.   In addition to the right-sided tumor, there are several small hyperechoic foci of approximately 1 cm, potentially additional [place] foci versus regenerative nodules.   Cirrhosis and splenomegaly   01/15/2021 Imaging   CT Chest  IMPRESSION: 1. No evidence of metastatic disease in the chest. 2. Cirrhotic morphology of the liver with ill-defined, heterogeneous mass of the right lobe of the liver and occlusion of the right portal vein, in keeping with known  hepatocellular carcinoma, better demonstrated by recent MR.   02/09/2021 Initial Diagnosis   Hepatocellular carcinoma ([place])   02/09/2021 Cancer Staging   Staging form: Liver, AJCC 8th Edition - Clinical stage from 02/09/2021: Stage IIIA (cT3, cN0, cM0) - Signed by Truitt Merle, MD on 02/09/2021 Stage prefix: Initial diagnosis    05/29/2021 Imaging   MRI Abdomen  IMPRESSION: 1. Interval decrease in size of the infiltrating mass in the right hepatic lobe showing heterogeneous arterial phase hyperenhancement. Persistent arterial phase enhancement can be seen early post y 95 embolization. No substantial restricted diffusion in the lesion on today's study. 2. Tiny focus of arterial phase hyperenhancement in the dome of the liver near the junction of segment IV and VIII. Unclear whether this was present previously given the marked motion degradation on the earlier study. Close attention on follow-up recommended. 3. 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 Subtraction postcontrast imaging suggests that there may be some enhancement in the thrombus suggesting tumor thrombus or a combination of tumor and bland thrombus. 4. Interval increase in size of the hepatoduodenal ligament lymph node measuring 2.0 x 1.7 cm. Likely metastatic disease,  continued close attention on follow-up recommended. 5. Markedly distended urinary bladder.   06/29/2021 -  Chemotherapy   Patient is on Treatment Plan : LUNG Atezolizumab + Bevacizumab q21d Maintenance     11/28/2021 Imaging   EXAM: CT ABDOMEN AND PELVIS WITH CONTRAST  IMPRESSION: 1. Cirrhotic morphology of the liver with redemonstrated post procedural  findings of right hepatic lobe embolization resulting in substantial atrophy of the right lobe of the liver. 2. Generally unchanged appearance of a hypoenhancing mass in the  peripheral right lobe following embolization, without convincing evidence of residual or recurrent contrast enhancement. 3. No new suspicious contrast enhancement in the liver. 4. No significant change in an enlarged hepatoduodenal or portacaval lymph node measuring 3.5 x 2.5 cm, likely a nodal metastasis. 5. Additional smaller, nonspecific porta hepatis and left retroperitoneal lymph nodes. Attention on follow-up. 6. Prostatomegaly with thickening of the urinary bladder wall and hyperenhancement of the mucosa. Correlate for urinalysis evidence of infectious or inflammatory cystitis   Aortic Atherosclerosis (ICD10-I70.0).      INTERVAL HISTORY:  Jim Little is here for a follow up of [place]. He was last seen by me on 12/14/21. He presents to the clinic accompanied by an interpreter. He reports "cold" at night. I asked about his electricity, and he states it is sometimes on.   All other systems were reviewed with the patient and are negative.  MEDICAL HISTORY:  Past Medical History:  Diagnosis Date   BPH (benign prostatic hyperplasia)    Cirrhosis ([place])    Hypertension    Splenomegaly     SURGICAL HISTORY: Past Surgical History:  Procedure Laterality Date   BIOPSY  08/22/2020   Procedure: BIOPSY;  Surgeon: Ronnette Juniper, MD;  Location: [facility];  Service: Gastroenterology;;   COLONOSCOPY WITH PROPOFOL N/A 08/22/2020   Procedure: COLONOSCOPY WITH PROPOFOL;  Surgeon: Ronnette Juniper, MD;  Location: [facility];  Service: Gastroenterology;  Laterality: N/A;   ESOPHAGOGASTRODUODENOSCOPY (EGD) WITH PROPOFOL N/A 08/22/2020   Procedure: ESOPHAGOGASTRODUODENOSCOPY (EGD) WITH PROPOFOL;  Surgeon: Ronnette Juniper, MD;  Location: [facility];  Service: Gastroenterology;  Laterality: N/A;   IR ANGIOGRAM SELECTIVE EACH ADDITIONAL VESSEL  03/01/2021   IR ANGIOGRAM SELECTIVE EACH ADDITIONAL VESSEL  03/08/2021   IR ANGIOGRAM VISCERAL SELECTIVE  03/01/2021   IR ANGIOGRAM VISCERAL SELECTIVE  03/01/2021   IR  ANGIOGRAM VISCERAL SELECTIVE  03/08/2021   IR EMBO TUMOR ORGAN ISCHEMIA INFARCT INC GUIDE ROADMAPPING  03/08/2021   IR RADIOLOGIST EVAL & MGMT  02/06/2021   IR RADIOLOGIST EVAL & MGMT  05/31/2021   IR US GUIDE VASC ACCESS RIGHT  03/01/2021   IR US GUIDE VASC ACCESS RIGHT  03/08/2021   UPPER GASTROINTESTINAL ENDOSCOPY  03/2018   [place] --candidiasis plus esophageal varices grade 2.  Treated with fluconazole and status post esophageal banding.   US ECHOCARDIOGRAPHY  10/2016   per overseas records from [place] - LVEF 68%; mildly calcified aortic valve, normal function otherwise    I have reviewed the social history and family history with the patient and they are unchanged from previous note.  ALLERGIES:  is allergic to pork-derived products.  MEDICATIONS:  Current Outpatient Medications  Medication Sig Dispense Refill   amLODipine (NORVASC) 10 MG tablet Take 1 tablet (10 mg total) by mouth daily. 90 tablet 0   nitrofurantoin, macrocrystal-monohydrate, (MACROBID) 100 MG capsule Take 1 capsule (100 mg total) by mouth 2 (two) times daily. 10 capsule 0   acetaminophen (TYLENOL) 500 MG tablet Take 500 mg by mouth 2 (two) times daily as needed for mild pain.     lactulose (KRISTALOSE) 10 g packet TAKE 1 PACKET (10 G TOTAL) BY MOUTH AS NEEDED (CONSTIPATION). DISSOLVE IN EIGHT OUNCES OF WATER 30 each 1   levothyroxine (SYNTHROID) 25 MCG tablet Take 3 tablets (75 mcg total) by mouth daily before breakfast. 90 tablet 1  Olopatadine HCl (PAZEO) 0.7 % SOLN Use once daily in both eyes 2.5 mL 0   omeprazole (PRILOSEC) 40 MG capsule Take 1 capsule (40 mg total) by mouth daily. 90 capsule 3   tamsulosin (FLOMAX) 0.4 MG CAPS capsule Take 1 capsule (0.4 mg total) by mouth daily. Please pack and mail to home 90 capsule 3   traMADol (ULTRAM) 50 MG tablet Take 1 tablet (50 mg total) by mouth every 12 (twelve) hours as needed for severe pain. 15 tablet 0   No current facility-administered medications for  this visit.   Facility-Administered Medications Ordered in Other Visits  Medication Dose Route Frequency Provider Last Rate Last Admin   sodium chloride flush (NS) 0.9 % injection 10 mL  10 mL Intracatheter PRN Truitt Merle, MD   10 mL at 09/19/21 1604    PHYSICAL EXAMINATION: ECOG PERFORMANCE STATUS: 1 - Symptomatic but completely ambulatory  Vitals:   01/03/22 1204  BP: (!) 180/67  Pulse: 68  Resp: 18  Temp: 98.5 F (36.9 C)  SpO2: 96%   Wt Readings from Last 3 Encounters:  01/03/22 153 lb 4.8 oz (69.5 kg)  12/14/21 149 lb 3.2 oz (67.7 kg)  11/22/21 148 lb 12.8 oz (67.5 kg)     GENERAL:alert, no distress and comfortable SKIN: skin color normal, no rashes or significant lesions EYES: normal, Conjunctiva are pink and non-injected, sclera clear  NEURO: alert & oriented x 3 with fluent speech  LABORATORY DATA:  I have reviewed the data as listed CBC Latest Ref Rng & Units 01/03/2022 12/14/2021 11/22/2021  WBC 4.0 - 10.5 K/uL 3.1(L) 4.8 5.9  Hemoglobin 13.0 - 17.0 g/dL 13.0 13.1 13.6  Hematocrit 39.0 - 52.0 % 37.8(L) 38.4(L) 40.6  Platelets 150 - 400 K/uL 110(L) 128(L) 125(L)     CMP Latest Ref Rng & Units 01/03/2022 12/14/2021 11/22/2021  Glucose 70 - 99 mg/dL 115(H) 90 108(H)  BUN 8 - 23 mg/dL 15 13 16   Creatinine 0.61 - 1.24 mg/dL 0.87 0.90 0.88  Sodium 135 - 145 mmol/L 136 136 138  Potassium 3.5 - 5.1 mmol/L 3.9 4.2 4.0  Chloride 98 - 111 mmol/L 106 105 106  CO2 22 - 32 mmol/L 27 25 26   Calcium 8.9 - 10.3 mg/dL 8.9 9.5 9.4  Total Protein 6.5 - 8.1 g/dL 8.2(H) 8.7(H) 9.0(H)  Total Bilirubin 0.3 - 1.2 mg/dL 0.9 0.9 0.8  Alkaline Phos 38 - 126 U/L 165(H) 159(H) 177(H)  AST 15 - 41 U/L 74(H) 60(H) 67(H)  ALT 0 - 44 U/L 45(H) 38 46(H)      RADIOGRAPHIC STUDIES: I have personally reviewed the radiological images as listed and agreed with the findings in the report. No results found.    No orders of the defined types were placed in this encounter.  All questions were  answered. The patient knows to call the clinic with any problems, questions or concerns. No barriers to learning was detected. The total time spent in the appointment was 30 minutes.     Truitt Merle, MD 01/03/2022   I, Wilburn Mylar, am acting as scribe for Truitt Merle, MD.   I have reviewed the above documentation for accuracy and completeness, and I agree with the above.

## 2022-01-04 ENCOUNTER — Telehealth: Payer: Self-pay | Admitting: Hematology

## 2022-01-04 LAB — GLUCOSE, POCT (MANUAL RESULT ENTRY): POC Glucose: 84 mg/dl (ref 70–99)

## 2022-01-04 NOTE — Telephone Encounter (Signed)
Left message with follow-up appointments per 2/9 los.

## 2022-01-08 DIAGNOSIS — Z139 Encounter for screening, unspecified: Secondary | ICD-10-CM

## 2022-01-08 NOTE — Congregational Nurse Program (Signed)
Patient seen in screening clinic at [facility].  BP 198/90 and pulse 69.  Blood glucose 69. States he saw doctor yesterday and was prescribed medicine for BP but has not started medication yet.  Phone call to daughter who states his medicine was delivered after he left for [employer] this morning.  She will make sure he takes it as soon as he gets home.  Jake Michaelis RN, Congregational Nurse [phone].

## 2022-01-09 ENCOUNTER — Other Ambulatory Visit: Payer: Self-pay

## 2022-01-09 DIAGNOSIS — E063 Autoimmune thyroiditis: Secondary | ICD-10-CM

## 2022-01-09 MED ORDER — LEVOTHYROXINE SODIUM 25 MCG PO TABS: 75.0000 ug | ORAL_TABLET | Freq: Every day | ORAL | 1 refills | Status: DC

## 2022-01-09 NOTE — Congregational Nurse Program (Signed)
°  Dept: [phone]   Congregational Nurse Program Note  Date of Encounter: 01/09/2022  Past Medical History: Past Medical History:  Diagnosis Date   BPH (benign prostatic hyperplasia)    Cirrhosis ([place])    Hypertension    Splenomegaly     Liver cancer  Encounter Details:  Routine home visit complete. Reviewed patient home medication. Patient has a lot of left over medications. He says that he is taking as prescribed. His wife says he is not taking his medication as prescribed and he misses some days. Educated both patient and his wife of medication compliance and that his blood pressure has been elevated lately.He stated that he will take his medication going forward.  Patient and family still experiencing housing issues with heat. Three out of five bedrooms do not have heat. [employer] is following up. Family is also actively looking for a 5 bedroom house.I will continue to assist as needed.  [name] Viha Kriegel RN [facility] [place] [phone]

## 2022-01-22 ENCOUNTER — Telehealth: Payer: Self-pay

## 2022-01-22 NOTE — Telephone Encounter (Signed)
°  Called patient with appointment reminder. Transportation  scheduled for tomorrow with pick up time 12:05 pm.Patient and his family are still experiencing issues with electricity at home. I have reached out to property management company [phone].  Family asked  to sent a new maintenance request ( family says they have done so several times). I will follow up again in 24 hrs.  [name] Rudie Sermons RN [facility] [place] [phone]

## 2022-01-23 ENCOUNTER — Other Ambulatory Visit: Payer: Self-pay

## 2022-01-23 ENCOUNTER — Inpatient Hospital Stay (HOSPITAL_BASED_OUTPATIENT_CLINIC_OR_DEPARTMENT_OTHER): Payer: Medicaid Other | Admitting: Hematology

## 2022-01-23 ENCOUNTER — Inpatient Hospital Stay: Payer: Medicaid Other

## 2022-01-23 ENCOUNTER — Inpatient Hospital Stay: Payer: Medicaid Other | Attending: Hematology

## 2022-01-23 ENCOUNTER — Encounter: Payer: Self-pay | Admitting: Hematology

## 2022-01-23 VITALS — BP 163/95 | HR 86 | Temp 98.5°F | Resp 18 | Ht 66.0 in

## 2022-01-23 DIAGNOSIS — I81 Portal vein thrombosis: Secondary | ICD-10-CM | POA: Diagnosis not present

## 2022-01-23 DIAGNOSIS — Z5112 Encounter for antineoplastic immunotherapy: Secondary | ICD-10-CM | POA: Insufficient documentation

## 2022-01-23 DIAGNOSIS — R300 Dysuria: Secondary | ICD-10-CM

## 2022-01-23 DIAGNOSIS — C220 Liver cell carcinoma: Secondary | ICD-10-CM | POA: Insufficient documentation

## 2022-01-23 DIAGNOSIS — N390 Urinary tract infection, site not specified: Secondary | ICD-10-CM | POA: Insufficient documentation

## 2022-01-23 DIAGNOSIS — K7460 Unspecified cirrhosis of liver: Secondary | ICD-10-CM | POA: Insufficient documentation

## 2022-01-23 DIAGNOSIS — C779 Secondary and unspecified malignant neoplasm of lymph node, unspecified: Secondary | ICD-10-CM | POA: Diagnosis not present

## 2022-01-23 DIAGNOSIS — I10 Essential (primary) hypertension: Secondary | ICD-10-CM | POA: Insufficient documentation

## 2022-01-23 DIAGNOSIS — N5089 Other specified disorders of the male genital organs: Secondary | ICD-10-CM | POA: Insufficient documentation

## 2022-01-23 DIAGNOSIS — Z79899 Other long term (current) drug therapy: Secondary | ICD-10-CM | POA: Insufficient documentation

## 2022-01-23 DIAGNOSIS — E039 Hypothyroidism, unspecified: Secondary | ICD-10-CM

## 2022-01-23 LAB — CMP (CANCER CENTER ONLY)
ALT: 32 U/L (ref 0–44)
AST: 63 U/L — ABNORMAL HIGH (ref 15–41)
Albumin: 3.3 g/dL — ABNORMAL LOW (ref 3.5–5.0)
Alkaline Phosphatase: 152 U/L — ABNORMAL HIGH (ref 38–126)
Anion gap: 8 (ref 5–15)
BUN: 18 mg/dL (ref 8–23)
CO2: 25 mmol/L (ref 22–32)
Calcium: 9 mg/dL (ref 8.9–10.3)
Chloride: 104 mmol/L (ref 98–111)
Creatinine: 0.92 mg/dL (ref 0.61–1.24)
GFR, Estimated: >60 mL/min (ref >60)
Glucose, Bld: 106 mg/dL — ABNORMAL HIGH (ref 70–99)
Potassium: 4.4 mmol/L (ref 3.5–5.1)
Sodium: 137 mmol/L (ref 135–145)
Total Bilirubin: 0.9 mg/dL (ref 0.3–1.2)
Total Protein: 8.4 g/dL — ABNORMAL HIGH (ref 6.5–8.1)

## 2022-01-23 LAB — URINALYSIS, COMPLETE (UACMP) WITH MICROSCOPIC
Bilirubin Urine: NEGATIVE
Glucose, UA: NEGATIVE mg/dL
Ketones, ur: NEGATIVE mg/dL
Nitrite: POSITIVE — AB
Protein, ur: 100 mg/dL — AB
RBC / HPF: >50 RBC/hpf — ABNORMAL HIGH (ref 0–5)
Specific Gravity, Urine: 1.014 (ref 1.005–1.030)
WBC, UA: >50 WBC/hpf — ABNORMAL HIGH (ref 0–5)
pH: 8 (ref 5.0–8.0)

## 2022-01-23 LAB — TSH: TSH: 4.115 u[IU]/mL (ref 0.320–4.118)

## 2022-01-23 LAB — CBC WITH DIFFERENTIAL (CANCER CENTER ONLY)
Abs Immature Granulocytes: 0.01 10*3/uL (ref 0.00–0.07)
Basophils Absolute: 0 10*3/uL (ref 0.0–0.1)
Basophils Relative: 0 %
Eosinophils Absolute: 0.1 10*3/uL (ref 0.0–0.5)
Eosinophils Relative: 2 %
HCT: 39.2 % (ref 39.0–52.0)
Hemoglobin: 13.5 g/dL (ref 13.0–17.0)
Immature Granulocytes: 0 %
Lymphocytes Relative: 23 %
Lymphs Abs: 1 10*3/uL (ref 0.7–4.0)
MCH: 34.5 pg — ABNORMAL HIGH (ref 26.0–34.0)
MCHC: 34.4 g/dL (ref 30.0–36.0)
MCV: 100.3 fL — ABNORMAL HIGH (ref 80.0–100.0)
Monocytes Absolute: 0.6 10*3/uL (ref 0.1–1.0)
Monocytes Relative: 14 %
Neutro Abs: 2.8 10*3/uL (ref 1.7–7.7)
Neutrophils Relative %: 61 %
Platelet Count: 154 10*3/uL (ref 150–400)
RBC: 3.91 MIL/uL — ABNORMAL LOW (ref 4.22–5.81)
RDW: 14.4 % (ref 11.5–15.5)
WBC Count: 4.5 10*3/uL (ref 4.0–10.5)
nRBC: 0 % (ref 0.0–0.2)

## 2022-01-23 LAB — T4, FREE: Free T4: 0.75 ng/dL (ref 0.61–1.12)

## 2022-01-23 LAB — TOTAL PROTEIN, URINE DIPSTICK: Protein, ur: 300 mg/dL — AB

## 2022-01-23 MED ORDER — SODIUM CHLORIDE 0.9 % IV SOLN
1200.0000 mg | Freq: Once | INTRAVENOUS | Status: AC
  Administered 2022-01-23: 1200 mg via INTRAVENOUS
  Filled 2022-01-23: qty 20

## 2022-01-23 MED ORDER — NITROFURANTOIN MONOHYD MACRO 100 MG PO CAPS: 100.0000 mg | ORAL_CAPSULE | Freq: Two times a day (BID) | ORAL | 0 refills | Status: DC

## 2022-01-23 MED ORDER — SODIUM CHLORIDE 0.9 % IV SOLN: Freq: Once | INTRAVENOUS | Status: AC

## 2022-01-23 NOTE — Progress Notes (Signed)
Per MD, hold Bevacizumab due to urine protein > 100. Proceed today with Tecentriq with pending CMP results.  ?

## 2022-01-23 NOTE — Patient Instructions (Signed)
[facility]  Discharge Instructions: °Thank you for choosing [facility] to provide your oncology and hematology care.  ° °If you have a lab appointment with the Cancer Center, please go directly to the Cancer Center and check in at the registration area. °  °Wear comfortable clothing and clothing appropriate for easy access to any Portacath or PICC line.  ° °We strive to give you quality time with your provider. You may need to reschedule your appointment if you arrive late (15 or more minutes).  Arriving late affects you and other patients whose appointments are after yours.  Also, if you miss three or more appointments without notifying the office, you may be dismissed from the clinic at the provider’s discretion.    °  °For prescription refill requests, have your pharmacy contact our office and allow 72 hours for refills to be completed.   ° °Today you received the following chemotherapy and/or immunotherapy agents: Atezolizumab.     °  °To help prevent nausea and vomiting after your treatment, we encourage you to take your nausea medication as directed. ° °BELOW ARE SYMPTOMS THAT SHOULD BE REPORTED IMMEDIATELY: °*FEVER GREATER THAN 100.4 F (38 °C) OR HIGHER °*CHILLS OR SWEATING °*NAUSEA AND VOMITING THAT IS NOT CONTROLLED WITH YOUR NAUSEA MEDICATION °*UNUSUAL SHORTNESS OF BREATH °*UNUSUAL BRUISING OR BLEEDING °*URINARY PROBLEMS (pain or burning when urinating, or frequent urination) °*BOWEL PROBLEMS (unusual diarrhea, constipation, pain near the anus) °TENDERNESS IN MOUTH AND THROAT WITH OR WITHOUT PRESENCE OF ULCERS (sore throat, sores in mouth, or a toothache) °UNUSUAL RASH, SWELLING OR PAIN  °UNUSUAL VAGINAL DISCHARGE OR ITCHING  ° °Items with * indicate a potential emergency and should be followed up as soon as possible or go to the Emergency Department if any problems should occur. ° °Please show the CHEMOTHERAPY ALERT CARD or IMMUNOTHERAPY ALERT CARD at  check-in to the Emergency Department and triage nurse. ° °Should you have questions after your visit or need to cancel or reschedule your appointment, please contact [facility]  Dept: [phone]  and follow the prompts.  Office hours are 8:00 a.m. to 4:30 p.m. Monday - Friday. Please note that voicemails left after 4:00 p.m. may not be returned until the following business day.  We are closed weekends and major holidays. You have access to a nurse at all times for urgent questions. Please call the main number to the clinic Dept: [phone] and follow the prompts. ° ° °For any non-urgent questions, you may also contact your provider using MyChart. We now offer e-Visits for anyone 18 and older to request care online for non-urgent symptoms. For details visit [email or web address]. °  °Also download the MyChart app! Go to the app store, search "MyChart", open the app, select [facility], and log in with your MyChart username and password. ° °Due to Covid, a mask is required upon entering the hospital/clinic. If you do not have a mask, one will be given to you upon arrival. For doctor visits, patients may have 1 support person aged 18 or older with them. For treatment visits, patients cannot have anyone with them due to current Covid guidelines and our immunocompromised population.  ° °

## 2022-01-23 NOTE — Progress Notes (Signed)
[place]   Telephone:([phone] Fax:(336) [phone]   Clinic Follow up Note   Patient Care Team: [name], MD as PCP - General (Family Medicine) Jonnie Finner, RN (Inactive) as Oncology Nurse Navigator Heilingoetter, Tobe Sos, PA-C as Physician Assistant (Physician Assistant) Truitt Merle, MD as Consulting Physician (Oncology)  Date of Service:  01/23/2022  CHIEF COMPLAINT: f/u of Jim Little  CURRENT THERAPY:  Tecentriq and Avastin, q3weeks, starting 06/29/21  ASSESSMENT & PLAN:  Jim Little is a 76 y.o. male with   1. Hepatocellular Carcinoma, unresectable, stage IIIA, with node metastasis  -Based on MRI liver 01/12/21 with Advanced cirrhotic changes involving the liver and Large infiltrating mass versus numerous confluent masses involving the right hepatic lobe. This is consistent with hepatocellular carcinoma. Associated thrombosed middle and portal veins. No definite left hepatic lobe lesions. AFP (01/14/21) 11,904.0. -His 01/15/21 CT of the chest did not show metastatic disease to the lungs. Staging workup completed. -He has unclear etiology of cirrhosis, which is followed by Dr. Therisa Doyne. Hep B surface antigen non reactive (05/15/20), ANA negative, Hep C ab non reactive. No alcohol abuse. -He is s/p Y 90 radioembolization on 03/08/21. His AFP has dropped significantly after Y90 -MRI scan from 05/29/21 showed good response in liver, but he developed hepatoduodenal ligament lymph node measuring 2.0 x 1.7 cm, likely metastatic disease   -given his extrahepatic metastasis, I recommend systemic treatment Tecentriq and Avastin. Pt has not been compliant with oral medicine, oral TKI will be challenge for him. He had a EGD in 07/2020, showed no significant varices, okay to proceed Avastin -He started Tecentriq and Avastin on 06/29/21. He has tolerated relatively well, with constipation. -CT AP on 11/28/21 showed: no convincing evidence of residual or recurrent enhancement; no new  enhancement in liver; similar enlarged portacaval lymph node. -His AFP has been rising lately, concerning for cancer progression. Chest CT 12/28/21 was negative. Plan to repeat CT abdomen in April  -He is clinically doing well overall. Lab reviewed, urine protein is 300; we will hold treatment today.   2. Symptom Management: Constipation, Central chest pain -constipation improved, he has lactulose.  -he reports a constant chest pain, rated about 5/10. Chest CT on 12/28/21 showed no acute findings or metastatic disease. -his BP has been more elevated in the clinic lately. I prescribed amlodipine for him on 01/03/22.  3. UTI -he reports urinary frequency and dysuria, new since his last visit on 01/03/22. We will obtain urine culture and UA today.   3. Scrotal swelling -developed following Y90 treatment -he is followed by [facility] for history of urinary retention and brought this to Dr. Keane Scrape attention on 04/30/21.  -his most recent scrotal US from 11/02/21, showed left epididymis enlargement, may be due to epididymitis, and extensive microlithiasis   4. Social Support, Language barrier -From the [place], was at a refugee camp in [place] -Has several family members living in single house. 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 Neither he nor his family speak [place]  -He speaks Swahili. He has [facility] Nurse, Honor Loh, who helps him. Her contact is [phone]   5. Portal vein thrombosis, Cirrhosis  -Seen with direct admission from 01/13/21-01/16/21 -Patient received heparin drip inpatient. Dr. Marin Olp did not recommend anticoagulation since the thrombus is tumor thrombus. -His liver cirrhosis is followed by Dr. Therisa Doyne in GI. Prior work up with EGD/Colonoscopy in 07/2020 which showed no varices  PLAN: -proceed with C9 Tecentriq today, hold beva due to high proteinuria  -UA was positive, I called in Macrobid  100 mg twice daily for 5 days -lab, flush, f/u, and C10 in 3 weeks, will order repeated CT scan on next visit    No problem-specific Assessment & Plan notes found for this encounter.   SUMMARY OF ONCOLOGIC HISTORY: Oncology History Overview Note  Cancer Staging Hepatocellular carcinoma [name]) Staging form: Liver, AJCC 8th Edition - Clinical stage from 02/09/2021: Stage IIIA (cT3, cN0, cM0) - Signed by Truitt Merle, MD on 02/09/2021 Stage prefix: Initial diagnosis    Hepatocellular carcinoma ([name])  05/12/2020 Imaging   MRI Liver  IMPRESSION: 1. Geographic lesion in the inferior medial aspect of the RIGHT hepatic lobe has some imaging features suggesting focal fatty infiltration. However, atypical enhancement pattern. Recommend follow-up contrast MRI in 3 to 6 months to re-evaluate. 2. No additional lesion liver. 3. Morphologic changes consistent with cirrhosis.   01/12/2021 Imaging   MRI Abdomen  IMPRESSION: 1. Advanced cirrhotic changes involving the liver. There is a large infiltrating mass versus numerous confluent masses involving the right hepatic lobe and consistent with hepatocellular carcinoma. Associated thrombosed middle and portal veins. No definite left hepatic lobe lesions. LI-RADS 5. 2. Borderline splenomegaly. 3. No ascites or abdominal wall hernia.     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 The ultrasound correlate of the previously demonstrated right liver tumor is heterogeneous tissue with ill-defined margin.   In addition to the right-sided tumor, there are several small hyperechoic foci of approximately 1 cm, potentially additional Landrum foci versus regenerative nodules.   Cirrhosis and splenomegaly   01/15/2021 Imaging   CT Chest  IMPRESSION: 1. No evidence of  metastatic disease in the chest. 2. Cirrhotic morphology of the liver with ill-defined, heterogeneous mass of the right lobe of the liver and occlusion of the right portal vein, in keeping with known hepatocellular carcinoma, better demonstrated by recent MR.   02/09/2021 Initial Diagnosis   Hepatocellular carcinoma ([place])   02/09/2021 Cancer Staging   Staging form: Liver, AJCC 8th Edition - Clinical stage from 02/09/2021: Stage IIIA (cT3, cN0, cM0) - Signed by Truitt Merle, MD on 02/09/2021 Stage prefix: Initial diagnosis    05/29/2021 Imaging   MRI Abdomen  IMPRESSION: 1. Interval decrease in size of the infiltrating mass in the right hepatic lobe showing heterogeneous arterial phase hyperenhancement. Persistent arterial phase enhancement can be seen early post y 58 embolization. No substantial restricted diffusion in the lesion on today's study. 2. Tiny focus of arterial phase hyperenhancement in the dome of the liver near the junction of segment IV and VIII. Unclear whether this was present previously given the marked motion degradation on the earlier study. Close attention on follow-up recommended. 3. 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 Subtraction postcontrast imaging suggests that there may be some enhancement in the thrombus suggesting tumor thrombus or a combination of tumor and bland thrombus. 4. Interval increase in size of the hepatoduodenal ligament lymph node measuring 2.0 x 1.7 cm. Likely metastatic disease,  continued close attention on follow-up recommended. 5. Markedly distended urinary bladder.   06/29/2021 -  Chemotherapy   Patient  is on Treatment Plan : LUNG Atezolizumab + Bevacizumab q21d Maintenance     11/28/2021 Imaging   EXAM: CT ABDOMEN AND PELVIS WITH CONTRAST  IMPRESSION: 1. Cirrhotic morphology of the liver with  redemonstrated post procedural findings of right hepatic lobe embolization resulting in substantial atrophy of the right lobe of the liver. 2. Generally unchanged appearance of a hypoenhancing mass in the peripheral right lobe following embolization, without convincing evidence of residual or recurrent contrast enhancement. 3. No new suspicious contrast enhancement in the liver. 4. No significant change in an enlarged hepatoduodenal or portacaval lymph node measuring 3.5 x 2.5 cm, likely a nodal metastasis. 5. Additional smaller, nonspecific porta hepatis and left retroperitoneal lymph nodes. Attention on follow-up. 6. Prostatomegaly with thickening of the urinary bladder wall and hyperenhancement of the mucosa. Correlate for urinalysis evidence of infectious or inflammatory cystitis   Aortic Atherosclerosis (ICD10-I70.0).      INTERVAL HISTORY:  Jim Little is here for a follow up and treatment of Jim Little. He was last seen by me on 01/03/2022. He presents to the clinic accompanied by his interpreter. He is clinically stable, reports intermittent chills, no fever.  He also reports urinary frequency and dysuria for the last week, no fever or chills.    All other systems were reviewed with the patient and are negative.  MEDICAL HISTORY:  Past Medical History:  Diagnosis Date   BPH (benign prostatic hyperplasia)    Cirrhosis ([place])    Hypertension    Splenomegaly     SURGICAL HISTORY: Past Surgical History:  Procedure Laterality Date   BIOPSY  08/22/2020   Procedure: BIOPSY;  Surgeon: Ronnette Juniper, MD;  Location: [facility];  Service: Gastroenterology;;   COLONOSCOPY WITH PROPOFOL N/A 08/22/2020   Procedure: COLONOSCOPY WITH PROPOFOL;  Surgeon: Ronnette Juniper, MD;  Location: [facility];  Service: Gastroenterology;  Laterality: N/A;   ESOPHAGOGASTRODUODENOSCOPY (EGD) WITH PROPOFOL N/A 08/22/2020   Procedure: ESOPHAGOGASTRODUODENOSCOPY (EGD) WITH PROPOFOL;  Surgeon: Ronnette Juniper, MD;  Location:  [facility];  Service: Gastroenterology;  Laterality: N/A;   IR ANGIOGRAM SELECTIVE EACH ADDITIONAL VESSEL  03/01/2021   IR ANGIOGRAM SELECTIVE EACH ADDITIONAL VESSEL  03/08/2021   IR ANGIOGRAM VISCERAL SELECTIVE  03/01/2021   IR ANGIOGRAM VISCERAL SELECTIVE  03/01/2021   IR ANGIOGRAM VISCERAL SELECTIVE  03/08/2021   IR EMBO TUMOR ORGAN ISCHEMIA INFARCT INC GUIDE ROADMAPPING  03/08/2021   IR RADIOLOGIST EVAL & MGMT  02/06/2021   IR RADIOLOGIST EVAL & MGMT  05/31/2021   IR US GUIDE VASC ACCESS RIGHT  03/01/2021   IR US GUIDE VASC ACCESS RIGHT  03/08/2021   UPPER GASTROINTESTINAL ENDOSCOPY  03/2018   [place] --candidiasis plus esophageal varices grade 2.  Treated with fluconazole and status post esophageal banding.   US ECHOCARDIOGRAPHY  10/2016   per overseas records from [place] - LVEF 68%; mildly calcified aortic valve, normal function otherwise    I have reviewed the social history and family history with the patient and they are unchanged from previous note.  ALLERGIES:  is allergic to pork-derived products.  MEDICATIONS:  Current Outpatient Medications  Medication Sig Dispense Refill   acetaminophen (TYLENOL) 500 MG tablet Take 500 mg by mouth 2 (two) times daily as needed for mild pain.     amLODipine (NORVASC) 10 MG tablet Take 1 tablet (10 mg total) by mouth daily. 90 tablet 0   lactulose (KRISTALOSE) 10 g packet TAKE 1 PACKET (10 G TOTAL) BY MOUTH AS NEEDED (CONSTIPATION). DISSOLVE IN EIGHT OUNCES  OF WATER 30 each 1   levothyroxine (SYNTHROID) 25 MCG tablet Take 3 tablets (75 mcg total) by mouth daily before breakfast. 90 tablet 1   nitrofurantoin, macrocrystal-monohydrate, (MACROBID) 100 MG capsule Take 1 capsule (100 mg total) by mouth 2 (two) times daily. 10 capsule 0   Olopatadine HCl (PAZEO) 0.7 % SOLN Use once daily in both eyes 2.5 mL 0   omeprazole (PRILOSEC) 40 MG capsule Take 1 capsule (40 mg total) by mouth daily. 90 capsule 3   tamsulosin (FLOMAX) 0.4 MG CAPS capsule  Take 1 capsule (0.4 mg total) by mouth daily. Please pack and mail to home 90 capsule 3   traMADol (ULTRAM) 50 MG tablet Take 1 tablet (50 mg total) by mouth every 12 (twelve) hours as needed for severe pain. 15 tablet 0   No current facility-administered medications for this visit.   Facility-Administered Medications Ordered in Other Visits  Medication Dose Route Frequency Provider Last Rate Last Admin   sodium chloride flush (NS) 0.9 % injection 10 mL  10 mL Intracatheter PRN Truitt Merle, MD   10 mL at 09/19/21 1604    PHYSICAL EXAMINATION: ECOG PERFORMANCE STATUS: 1 - Symptomatic but completely ambulatory  There were no vitals filed for this visit. Wt Readings from Last 3 Encounters:  01/03/22 153 lb 4.8 oz (69.5 kg)  12/14/21 149 lb 3.2 oz (67.7 kg)  11/22/21 148 lb 12.8 oz (67.5 kg)     GENERAL:alert, no distress and comfortable SKIN: skin color, texture, turgor are normal, no rashes or significant lesions EYES: normal, Conjunctiva are pink and non-injected, sclera clear Musculoskeletal:no cyanosis of digits and no clubbing  NEURO: alert & oriented x 3 with fluent speech, no focal motor/sensory deficits  LABORATORY DATA:  I have reviewed the data as listed CBC Latest Ref Rng & Units 01/23/2022 01/03/2022 12/14/2021  WBC 4.0 - 10.5 K/uL 4.5 3.1(L) 4.8  Hemoglobin 13.0 - 17.0 g/dL 13.5 13.0 13.1  Hematocrit 39.0 - 52.0 % 39.2 37.8(L) 38.4(L)  Platelets 150 - 400 K/uL 154 110(L) 128(L)     CMP Latest Ref Rng & Units 01/23/2022 01/03/2022 12/14/2021  Glucose 70 - 99 mg/dL 106(H) 115(H) 90  BUN 8 - 23 mg/dL 18 15 13   Creatinine 0.61 - 1.24 mg/dL 0.92 0.87 0.90  Sodium 135 - 145 mmol/L 137 136 136  Potassium 3.5 - 5.1 mmol/L 4.4 3.9 4.2  Chloride 98 - 111 mmol/L 104 106 105  CO2 22 - 32 mmol/L 25 27 25   Calcium 8.9 - 10.3 mg/dL 9.0 8.9 9.5  Total Protein 6.5 - 8.1 g/dL 8.4(H) 8.2(H) 8.7(H)  Total Bilirubin 0.3 - 1.2 mg/dL 0.9 0.9 0.9  Alkaline Phos 38 - 126 U/L 152(H) 165(H) 159(H)   AST 15 - 41 U/L 63(H) 74(H) 60(H)  ALT 0 - 44 U/L 32 45(H) 38      RADIOGRAPHIC STUDIES: I have personally reviewed the radiological images as listed and agreed with the findings in the report. No results found.    Orders Placed This Encounter  Procedures   Urinalysis, Complete w Microscopic    Add on today    Standing Status:   Future    Number of Occurrences:   1    Standing Expiration Date:   07/26/2022   All questions were answered. The patient knows to call the clinic with any problems, questions or concerns. No barriers to learning was detected. The total time spent in the appointment was 30 minutes.     Krista Blue  Burr Medico, MD 01/23/2022   I, Wilburn Mylar, am acting as scribe for Truitt Merle, MD.   I have reviewed the above documentation for accuracy and completeness, and I agree with the above.

## 2022-01-24 ENCOUNTER — Ambulatory Visit: Payer: Medicaid Other

## 2022-01-24 ENCOUNTER — Other Ambulatory Visit: Payer: Medicaid Other

## 2022-01-24 ENCOUNTER — Ambulatory Visit: Payer: Medicaid Other | Admitting: Hematology

## 2022-01-24 ENCOUNTER — Telehealth: Payer: Self-pay

## 2022-01-24 LAB — AFP TUMOR MARKER: AFP, Serum, Tumor Marker: 9337 ng/mL — ABNORMAL HIGH (ref 0.0–8.4)

## 2022-01-24 NOTE — Telephone Encounter (Signed)
[facility] contacted. Appointment scheduled  for March 13th at 11 am. Patient called and made aware. ? ?Honor Loh RN BSn PCCN  ?[place] Nurse ?[phone]-cell ?[phone]-office ? ?

## 2022-01-27 LAB — URINE CULTURE: Culture: >=100000 — AB

## 2022-01-28 ENCOUNTER — Telehealth: Payer: Self-pay

## 2022-01-28 NOTE — Telephone Encounter (Signed)
LVM for Jim Little to contact Dr. Ernestina Penna office regarding pt's urine specimen test results.  LVM stating that pt's urine specimen test came back positive for bacteria and Dr. Burr Medico has prescribed an antibiotic on 01/23/2022 called Macrobid.  Dr. Burr Medico wants to know has the pt picked up the prescription and is taking the abx.  Instructed [name] to please contact Dr. Ernestina Penna office stating whether or not the pt is taking the macrobid.  Awaiting [name]'s response d/t pt does not speak [place]. ?

## 2022-01-31 NOTE — Congregational Nurse Program (Signed)
?  Dept: [phone] ? ? ?Congregational Nurse Program Note ? ?Date of Encounter:  ? ?Past Medical History: ?Past Medical History:  ?Diagnosis Date  ? BPH (benign prostatic hyperplasia)   ? Cirrhosis ([place])   ? Hypertension   ? Splenomegaly   ? ? ?Encounter Details: ? CNP Questionnaire - 01/30/22 1200   ? ?  ? Questionnaire  ? Do you give verbal consent to treat you today? Yes   ? Location Patient Served  [facility]   ? Visit Setting Home   ? Patient Status Refugee   ? Insurance Medicaid   ? Insurance Referral N/A   ? Medication Have Medication Insecurities;Provided Medication Assistance;Referred to Medication Assistance   ? Medical Provider Yes   ? Screening Referrals N/A   ? Medical Referral N/A   ? Medical Appointment Made N/A   ? Food N/A   ? Transportation Need transportation assistance   ? Housing/Utilities Referred to housing/utility assistance program;Referred to homeless shelter, day center   ? Interpersonal Safety N/A   ? Intervention Advocate;Navigate Healthcare System;Case Management;Counsel;Educate;Support   ? ED Visit Averted Yes   ? Life-Saving Intervention Made N/A   ? ?  ?  ? ?  ? ?01/30/21 12pm ?Home visit completed. ? ?Patient medications inspected. He still has a lot of left over medications and patient is feeling overwhelmed. He has just received a new delivery . I will call pharmacy  to hold further delivery of Norvasc, flomax and synthroid until he is done with what he has.Advised on compliance. ? ?He has Macrobid dated 01/23/22 prescribed for UTI. Patient educated on how to take [place] and instructed to complete the regimen.  ? ?Honor Loh RN BSn PCCN  ?[place] Nurse ?[phone]-cell ?[phone]-office ? ? ? ?

## 2022-01-31 NOTE — Congregational Nurse Program (Deleted)
?  Dept: [phone] ? ? ?Congregational Nurse Program Note ? ?Date of Encounter: 01/31/2022 ? ?Past Medical History: ?Past Medical History:  ?Diagnosis Date  ? BPH (benign prostatic hyperplasia)   ? Cirrhosis ([place])   ? Hypertension   ? Splenomegaly   ? ? ?Encounter Details: ? ? ? ? ?

## 2022-02-04 ENCOUNTER — Telehealth: Payer: Self-pay

## 2022-02-04 NOTE — Telephone Encounter (Signed)
Patient called with appointment reminder scheduled today with [facility]. ? ?Honor Loh RN BSn PCCN  ?[place] Nurse ?[phone]-cell ?[phone]-office ?  ?

## 2022-02-07 ENCOUNTER — Telehealth: Payer: Self-pay

## 2022-02-07 NOTE — Telephone Encounter (Signed)
Antibiotics ordered by [facility]  not yet delivered to patient. [employer]  contacted who stated that medication will be delivered to patient home tomorrow. ? ?Honor Loh RN BSn PCCN  ?[place] Nurse ?[phone]-cell ?[phone]-office ? ?

## 2022-02-09 NOTE — Congregational Nurse Program (Signed)
?  Dept: [phone] ? ? ?Congregational Nurse Program Note ? ?Date of Encounter: 02/09/2022 ? ?Past Medical History: ?Past Medical History:  ?Diagnosis Date  ? BPH (benign prostatic hyperplasia)   ? Cirrhosis ([place])   ? Hypertension   ? Splenomegaly   ? ? ?Encounter Details: ? CNP Questionnaire - 02/08/22 1500   ? ?  ? Questionnaire  ? Do you give verbal consent to treat you today? Yes   ? Location Patient Served  [facility]   ? Visit Setting Home   ? Patient Status Refugee   ? Insurance Medicaid   ? Insurance Referral N/A   ? Medication Have Medication Insecurities;Provided Medication Assistance;Referred to Medication Assistance   ? Medical Provider Yes   ? Screening Referrals N/A   ? Medical Referral N/A   ? Medical Appointment Made N/A   ? Food N/A   ? Transportation Need transportation assistance   ? Housing/Utilities No permanent housing   ? Interpersonal Safety N/A   ? Intervention Advocate;Navigate Healthcare System;Case Management;Counsel;Educate;Support   ? ED Visit Averted Yes   ? Life-Saving Intervention Made N/A   ? ?  ?  ? ?  ? ? ?Received a call from [facility] with urine culture results. MD advised patient to take  Nitrofurantoin '100mg'$  BID. Patient to stop taking Keflex. Both antibiotics delivered today. Education on Nitrofurantoin provided and patient verbalized understanding. Keflex will be taken back to outpatient pharmacy for disposal. ? ?Honor Loh RN BSn PCCN  ?[place] Nurse ?[phone]-cell ?[phone]-office ? ? ?

## 2022-02-13 ENCOUNTER — Telehealth: Payer: Self-pay

## 2022-02-13 NOTE — Telephone Encounter (Signed)
Pt. Called and given an appointment reminder for 02/14/22 at 8:00 am with the urologist. Transportation assistance was provided.  ? ?Honor Loh RN BSn PCCN  ?[place] Nurse ?[phone]-cell ?[phone]-office  ?

## 2022-02-14 ENCOUNTER — Inpatient Hospital Stay: Payer: Medicaid Other

## 2022-02-14 ENCOUNTER — Other Ambulatory Visit: Payer: Self-pay | Admitting: Family Medicine

## 2022-02-14 ENCOUNTER — Inpatient Hospital Stay (HOSPITAL_BASED_OUTPATIENT_CLINIC_OR_DEPARTMENT_OTHER): Payer: Medicaid Other | Admitting: Hematology

## 2022-02-14 ENCOUNTER — Other Ambulatory Visit: Payer: Self-pay

## 2022-02-14 VITALS — BP 175/79 | HR 85 | Temp 99.4°F | Resp 18

## 2022-02-14 DIAGNOSIS — C220 Liver cell carcinoma: Secondary | ICD-10-CM

## 2022-02-14 DIAGNOSIS — Z5112 Encounter for antineoplastic immunotherapy: Secondary | ICD-10-CM | POA: Diagnosis not present

## 2022-02-14 DIAGNOSIS — E039 Hypothyroidism, unspecified: Secondary | ICD-10-CM

## 2022-02-14 LAB — CMP (CANCER CENTER ONLY)
ALT: 29 U/L (ref 0–44)
AST: 54 U/L — ABNORMAL HIGH (ref 15–41)
Albumin: 3.5 g/dL (ref 3.5–5.0)
Alkaline Phosphatase: 152 U/L — ABNORMAL HIGH (ref 38–126)
Anion gap: 5 (ref 5–15)
BUN: 14 mg/dL (ref 8–23)
CO2: 25 mmol/L (ref 22–32)
Calcium: 9.2 mg/dL (ref 8.9–10.3)
Chloride: 105 mmol/L (ref 98–111)
Creatinine: 0.77 mg/dL (ref 0.61–1.24)
GFR, Estimated: >60 mL/min (ref >60)
Glucose, Bld: 160 mg/dL — ABNORMAL HIGH (ref 70–99)
Potassium: 4 mmol/L (ref 3.5–5.1)
Sodium: 135 mmol/L (ref 135–145)
Total Bilirubin: 0.7 mg/dL (ref 0.3–1.2)
Total Protein: 8.7 g/dL — ABNORMAL HIGH (ref 6.5–8.1)

## 2022-02-14 LAB — CBC WITH DIFFERENTIAL (CANCER CENTER ONLY)
Abs Immature Granulocytes: 0.02 10*3/uL (ref 0.00–0.07)
Basophils Absolute: 0 10*3/uL (ref 0.0–0.1)
Basophils Relative: 0 %
Eosinophils Absolute: 0.1 10*3/uL (ref 0.0–0.5)
Eosinophils Relative: 1 %
HCT: 38.1 % — ABNORMAL LOW (ref 39.0–52.0)
Hemoglobin: 12.8 g/dL — ABNORMAL LOW (ref 13.0–17.0)
Immature Granulocytes: 0 %
Lymphocytes Relative: 12 %
Lymphs Abs: 0.8 10*3/uL (ref 0.7–4.0)
MCH: 34 pg (ref 26.0–34.0)
MCHC: 33.6 g/dL (ref 30.0–36.0)
MCV: 101.1 fL — ABNORMAL HIGH (ref 80.0–100.0)
Monocytes Absolute: 1 10*3/uL (ref 0.1–1.0)
Monocytes Relative: 15 %
Neutro Abs: 5.1 10*3/uL (ref 1.7–7.7)
Neutrophils Relative %: 72 %
Platelet Count: 123 10*3/uL — ABNORMAL LOW (ref 150–400)
RBC: 3.77 MIL/uL — ABNORMAL LOW (ref 4.22–5.81)
RDW: 14.9 % (ref 11.5–15.5)
WBC Count: 7.1 10*3/uL (ref 4.0–10.5)
nRBC: 0 % (ref 0.0–0.2)

## 2022-02-14 LAB — TOTAL PROTEIN, URINE DIPSTICK: Protein, ur: 300 mg/dL — AB

## 2022-02-14 LAB — T4, FREE: Free T4: 0.6 ng/dL — ABNORMAL LOW (ref 0.61–1.12)

## 2022-02-14 MED ORDER — SODIUM CHLORIDE 0.9 % IV SOLN: Freq: Once | INTRAVENOUS | Status: AC

## 2022-02-14 MED ORDER — SODIUM CHLORIDE 0.9 % IV SOLN
1200.0000 mg | Freq: Once | INTRAVENOUS | Status: AC
  Administered 2022-02-14: 1200 mg via INTRAVENOUS
  Filled 2022-02-14: qty 20

## 2022-02-14 NOTE — Patient Instructions (Signed)
[facility]ICAL [facility]COLOG[facility]  Discharge Instructions: °Thank you for choosing [facility] to provide your oncology and hematology care.  ° °If you have a lab appointment with the Cancer Center, please go directly to the Cancer Center and check in at the registration area. °  °Wear comfortable clothing and clothing appropriate for easy access to any Portacath or PICC line.  ° °We strive to give you quality time with your provider. You may need to reschedule your appointment if you arrive late (15 or more minutes).  Arriving late affects you and other patients whose appointments are after yours.  Also, if you miss three or more appointments without notifying the office, you may be dismissed from the clinic at the provider’s discretion.    °  °For prescription refill requests, have your pharmacy contact our office and allow 72 hours for refills to be completed.   ° °Today you received the following chemotherapy and/or immunotherapy agents: Atezolizumab.     °  °To help prevent nausea and vomiting after your treatment, we encourage you to take your nausea medication as directed. ° °BELOW ARE SYMPTOMS THAT SHOULD BE REPORTED IMMEDIATELY: °*FEVER GREATER THAN 100.4 F (38 °C) OR HIGHER °*CHILLS OR SWEATING °*NAUSEA AND VOMITING THAT IS NOT CONTROLLED WITH YOUR NAUSEA MEDICATION °*UNUSUAL SHORTNESS OF BREATH °*UNUSUAL BRUISING OR BLEEDING °*URINARY PROBLEMS (pain or burning when urinating, or frequent urination) °*BOWEL PROBLEMS (unusual diarrhea, constipation, pain near the anus) °TENDERNESS IN MOUTH AND THROAT WITH OR WITHOUT PRESENCE OF ULCERS (sore throat, sores in mouth, or a toothache) °UNUSUAL RASH, SWELLING OR PAIN  °UNUSUAL VAGINAL DISCHARGE OR ITCHING  ° °Items with * indicate a potential emergency and should be followed up as soon as possible or go to the Emergency Department if any problems should occur. ° °Please show the CHEMOTHERAPY ALERT CARD or IMMUNOTHERAPY ALERT CARD at  check-in to the Emergency Department and triage nurse. ° °Should you have questions after your visit or need to cancel or reschedule your appointment, please contact [facility]  Dept: [phone]  and follow the prompts.  Office hours are 8:00 a.m. to 4:30 p.m. Monday - Friday. Please note that voicemails left after 4:00 p.m. may not be returned until the following business day.  We are closed weekends and major holidays. You have access to a nurse at all times for urgent questions. Please call the main number to the clinic Dept: [phone] and follow the prompts. ° ° °For any non-urgent questions, you may also contact your provider using MyChart. We now offer e-Visits for anyone 18 and older to request care online for non-urgent symptoms. For details visit [email or web address]. °  °Also download the MyChart app! Go to the app store, search "MyChart", open the app, select Outlook, and log in with your MyChart username and password. ° °Due to Covid, a mask is required upon entering the hospital/clinic. If you do not have a mask, one will be given to you upon arrival. For doctor visits, patients may have 1 support person aged 18 or older with them. For treatment visits, patients cannot have anyone with them due to current Covid guidelines and our immunocompromised population.  ° °

## 2022-02-14 NOTE — Progress Notes (Addendum)
?[place]   ?[phone] [phone]   ?Clinic Follow up Note  ? ?Patient Care Team: ?[name], MD as PCP - General (Family Medicine) ?Jonnie Finner, RN (Inactive) as Oncology Nurse Navigator ?Heilingoetter, Tobe Sos, PA-C as Librarian, academic (Physician Environmental consultant) ?Truitt Merle, MD as Consulting Physician (Oncology) ? ?Date of Service:  02/14/2022 ? ?CHIEF COMPLAINT: f/u of [place] ? ?CURRENT THERAPY:  ?Tecentriq and Avastin, q3weeks, starting 06/29/21 ? ?ASSESSMENT & PLAN:  ?Jim Little is a 76 y.o. male with  ? ?1. Hepatocellular Carcinoma, unresectable, stage IIIA, with node metastasis  ?-Based on MRI liver 01/12/21 with Advanced cirrhotic changes involving the liver and Large infiltrating mass versus numerous confluent masses involving the right hepatic lobe. This is consistent with hepatocellular carcinoma. Associated thrombosed middle and portal veins. No definite left hepatic lobe lesions. AFP (01/14/21) 11,904.0. ?-His 01/15/21 CT of the chest did not show metastatic disease to the lungs. Staging workup completed. ?-He has unclear etiology of cirrhosis, which is followed by Dr. Therisa Doyne. Hep B surface antigen non reactive (05/15/20), ANA negative, Hep C ab non reactive. No alcohol abuse. ?-He is s/p Y 90 radioembolization on 03/08/21. His AFP has dropped significantly after Y90 ?-MRI scan from 05/29/21 showed good response in liver, but he developed hepatoduodenal ligament lymph node measuring 2.0 x 1.7 cm, likely metastatic disease   ?-given his extrahepatic metastasis, I recommend systemic treatment Tecentriq and Avastin. Pt has not been compliant with oral medicine, oral TKI will be challenge for him. He had a EGD in 07/2020, showed no significant varices, okay to proceed Avastin ?-He started Tecentriq and Avastin on 06/29/21. He has tolerated relatively well, with constipation. ?-CT AP on 11/28/21 showed: no convincing evidence of residual or recurrent enhancement; no new  enhancement in liver; similar enlarged portacaval lymph node. ?-His AFP has been rising lately, concerning for cancer progression. Chest CT 12/28/21 was negative. Plan to repeat CT abdomen in April  ?-He is clinically doing well overall. Lab reviewed, urine protein is 300; we will proceed with tecentriq alone today. ?  ?2. Symptom Management: Constipation, Central chest pain ?-constipation improved, he has lactulose.  ?-he reports a constant chest pain, rated about 5/10. Chest CT on 12/28/21 showed no acute findings or metastatic disease. ?-his BP has been more elevated in the clinic lately. I prescribed amlodipine for him on 01/03/22. He tells me today he takes it every day except on days he comes here. ?  ?3. UTI, h/o of urinary retention ?-UA on 01/03/22 was positive. He was treated with a 5 day course of Macrobid. ?-he had foley catheter placed on 3/13, and it was removed today. ?-LUTS improved. He is followed by [facility]. ?  ?4. Scrotal swelling ?-developed following Y90 treatment ?-his scrotal US from 11/02/21, showed left epididymis enlargement, may be due to epididymitis, and extensive microlithiasis ?-f/u with urology  ?  ?5. Social Support, Language barrier ?-From the [place], was at a refugee camp in [place] ?-Has several family members living in single house. 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 Neither he nor his family speak [place]  ?-He speaks Swahili. He has [facility] Nurse, Honor Loh, who helps him. Her contact is [phone] ?  ?6. Portal vein thrombosis, Cirrhosis  ?-Seen with direct admission from 01/13/21-01/16/21 ?-Patient received heparin drip inpatient. Dr. Marin Olp did not recommend anticoagulation since the thrombus is tumor thrombus. ?-His liver cirrhosis is followed by  Dr. Therisa Doyne in GI. Prior work up with EGD/Colonoscopy in 07/2020 which showed no varices  ?  ?  ?PLAN: ?-proceed with C10 Tecentriq today, hold  beva due to high proteinuria  ?-f/u and C11 in 3 weeks, with lab and CT scan several days before ? ? ?No problem-specific Assessment & Plan notes found for this encounter. ? ? ?SUMMARY OF ONCOLOGIC HISTORY: ?Oncology History Overview Note  ?Cancer Staging ?Hepatocellular carcinoma ([place]) ?Staging form: Liver, AJCC 8th Edition ?- Clinical stage from 02/09/2021: Stage IIIA (cT3, cN0, cM0) - Signed by Truitt Merle, MD on 02/09/2021 ?Stage prefix: Initial diagnosis ? ?  ?Hepatocellular carcinoma ([place])  ?05/12/2020 Imaging  ? MRI Liver  ?IMPRESSION: ?1. Geographic lesion in the inferior medial aspect of the RIGHT ?hepatic lobe has some imaging features suggesting focal fatty ?infiltration. However, atypical enhancement pattern. Recommend ?follow-up contrast MRI in 3 to 6 months to re-evaluate. ?2. No additional lesion liver. ?3. Morphologic changes consistent with cirrhosis. ?  ?01/12/2021 Imaging  ? MRI Abdomen  ?IMPRESSION: ?1. Advanced cirrhotic changes involving the liver. There is a large ?infiltrating mass versus numerous confluent masses involving the ?right hepatic lobe and consistent with hepatocellular carcinoma. ?Associated thrombosed middle and portal veins. No definite left ?hepatic lobe lesions. LI-RADS 5. ?2. Borderline splenomegaly. ?3. No ascites or abdominal wall hernia. ?  ?  ?01/14/2021 Imaging  ? US Liver Doppler  ?IMPRESSION: ?Directed duplex of the hepatic vasculature confirms right portal ?vein and right hepatic vein occlusion, presumably from tumor ?thrombus given the right-sided liver tumor on MRI. The ultrasound ?correlate of the previously demonstrated right liver tumor is ?heterogeneous tissue with ill-defined margin. ?  ?In addition to the right-sided tumor, there are several small ?hyperechoic foci of approximately 1 cm, potentially additional HCC ?foci versus regenerative nodules. ?  ?Cirrhosis and splenomegaly ?  ?01/15/2021 Imaging  ? CT Chest  ?IMPRESSION: ?1. No evidence of metastatic disease in  the chest. ?2. Cirrhotic morphology of the liver with ill-defined, heterogeneous ?mass of the right lobe of the liver and occlusion of the right ?portal vein, in keeping with known hepatocellular carcinoma, better ?demonstrated by recent MR. ?  ?02/09/2021 Initial Diagnosis  ? Hepatocellular carcinoma ([place]) ?  ?02/09/2021 Cancer Staging  ? Staging form: Liver, AJCC 8th Edition ?- Clinical stage from 02/09/2021: Stage IIIA (cT3, cN0, cM0) - Signed by Truitt Merle, MD on 02/09/2021 ?Stage prefix: Initial diagnosis ? ?  ?05/29/2021 Imaging  ? MRI Abdomen ? ?IMPRESSION: ?1. Interval decrease in size of the infiltrating mass in the right ?hepatic lobe showing heterogeneous arterial phase hyperenhancement. Persistent arterial phase enhancement can be seen early post y 70 embolization. No substantial restricted diffusion in the lesion on today's study. ?2. Tiny focus of arterial phase hyperenhancement in the dome of the liver near the junction of segment IV and VIII. Unclear whether this was present previously given the marked motion degradation on the earlier study. Close attention on follow-up recommended. ?3. 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 Subtraction postcontrast imaging suggests that there may be some enhancement in the thrombus suggesting tumor thrombus or a combination of tumor and bland thrombus. ?4. Interval increase in size of the hepatoduodenal ligament lymph ?node measuring 2.0 x 1.7 cm. Likely metastatic disease,  continued close attention on follow-up recommended. ?5. Markedly distended urinary bladder. ?  ?06/29/2021 -  Chemotherapy  ?  Patient is on Treatment Plan : LUNG Atezolizumab + Bevacizumab q21d Maintenance  ?   ?11/28/2021 Imaging  ? EXAM: ?CT ABDOMEN AND PELVIS WITH CONTRAST ? ?IMPRESSION: ?1. Cirrhotic morphology of the liver with redemonstrated  post ?procedural findings of right hepatic lobe embolization resulting in ?substantial atrophy of the right lobe of the liver. ?2. Generally unchanged appearance of a hypoenhancing mass in the peripheral right lobe f

## 2022-02-14 NOTE — Congregational Nurse Program (Signed)
?  Dept: [phone] ? ? ?Congregational Nurse Program Note ? ?Date of Encounter: 02/14/2022 ? ?Past Medical History: ?Past Medical History:  ?Diagnosis Date  ? BPH (benign prostatic hyperplasia)   ? Cirrhosis ([place])   ? Hypertension   ? Splenomegaly   ? ? ?Encounter Details: ? CNP Questionnaire - 02/14/22 1109   ? ?  ? Questionnaire  ? Do you give verbal consent to treat you today? Yes   ? Location Patient Served  [facility]   ? Visit Setting Home   ? Patient Status Refugee   ? Insurance Medicaid   ? Insurance Referral N/A   ? Medication Have Medication Insecurities;Provided Medication Assistance;Referred to Medication Assistance   ? Medical Provider Yes   ? Screening Referrals N/A   ? Medical Referral N/A   ? Medical Appointment Made N/A   ? Food N/A   ? Transportation Need transportation assistance   ? Housing/Utilities No permanent housing   ? Interpersonal Safety N/A   ? Intervention Advocate;Navigate Healthcare System;Case Management;Counsel;Educate;Support   ? ED Visit Averted Yes   ? Life-Saving Intervention Made N/A   ? ?  ?  ? ?  ? ?Transportation assistance provided to and from Urology appointment today. Foley catheter removed by urology today. ?Patient scheduled to see oncology today. Transportation arrangements made. ? ?Honor Loh RN BSn PCCN  ?[place] Nurse ?[phone]-cell ?[phone]-office ? ? ?

## 2022-02-15 ENCOUNTER — Telehealth: Payer: Self-pay | Admitting: Hematology

## 2022-02-15 NOTE — Telephone Encounter (Signed)
Scheduled per 3/23 los, pt's daughter has been called and confirmed  ?

## 2022-02-16 ENCOUNTER — Encounter: Payer: Self-pay | Admitting: Hematology

## 2022-02-19 NOTE — Congregational Nurse Program (Signed)
?  Dept: [phone] ? ? ?Congregational Nurse Program Note ? ?Date of Encounter: 02/19/2022 ? ?Past Medical History: ?Past Medical History:  ?Diagnosis Date  ? BPH (benign prostatic hyperplasia)   ? Cirrhosis ([place])   ? Hypertension   ? Splenomegaly   ? ? ?Encounter Details: ? CNP Questionnaire - 02/19/22 1056   ? ?  ? Questionnaire  ? Do you give verbal consent to treat you today? Yes   ? Location Patient Served  [facility]   ? Visit Setting Phone/Text/Email   ? Patient Status Refugee   ? Insurance Medicaid   ? Insurance Referral Medicaid   ? Medication Have Medication Insecurities;Provided Medication Assistance;Referred to Medication Assistance   ? Medical Provider Yes   ? Screening Referrals N/A   ? Medical Referral N/A   ? Medical Appointment Made Other   ? Food N/A   ? Transportation Need transportation assistance;Provided transportation assistance;Referred to transportation service   ? Housing/Utilities No permanent housing   ? Interpersonal Safety N/A   ? Intervention Advocate;Navigate Healthcare System;Case Management;Counsel;Educate;Support   ? ED Visit Averted Yes   ? Life-Saving Intervention Made N/A   ? ?  ?  ? ?  ? ?Transportation scheduled with medicaid.  ? ?Honor Loh RN BSn PCCN  ?[place] Nurse ?[phone]-cell ?[phone]-office ? ? ? ? ?

## 2022-02-22 ENCOUNTER — Telehealth: Payer: Self-pay

## 2022-02-22 NOTE — Telephone Encounter (Signed)
Family has moved, address changed in Epic and pharmacy ? ?[name] Donzel Romack RN BSn PCCN  ?[place] Nurse ?[phone]-cell ?[phone]-office ? ?

## 2022-03-06 ENCOUNTER — Telehealth: Payer: Self-pay

## 2022-03-06 ENCOUNTER — Ambulatory Visit (HOSPITAL_COMMUNITY)
Admission: RE | Admit: 2022-03-06 | Discharge: 2022-03-06 | Disposition: A | Discharge status: Home / Self Care | Payer: Medicaid Other | Source: Ambulatory Visit | Attending: Hematology | Admitting: Hematology

## 2022-03-06 DIAGNOSIS — C220 Liver cell carcinoma: Secondary | ICD-10-CM | POA: Diagnosis present

## 2022-03-06 MED ORDER — SODIUM CHLORIDE (PF) 0.9 % IJ SOLN
INTRAMUSCULAR | Status: AC
  Filled 2022-03-06: qty 50

## 2022-03-06 MED ORDER — IOHEXOL 300 MG/ML  SOLN
100.0000 mL | Freq: Once | INTRAMUSCULAR | Status: AC | PRN
  Administered 2022-03-06: 100 mL via INTRAVENOUS

## 2022-03-06 NOTE — Telephone Encounter (Signed)
Pt contacted with appt reminder.  Transportation assistance provided via taxi voucher from [employer] research (physical sciences).  ? ?Honor Loh RN BSn PCCN  ?[place] Nurse ?[phone]-cell ?[phone]-office  ?

## 2022-03-06 NOTE — Telephone Encounter (Signed)
Called insurance provider to confirm transportation previously scheduled.  Pt has moved recently and we needed to change his address so transportation goes to correct address. Insurance provider unable to change address without patient's verbal consent. Transportation not confirmed for next appointment.   Pt is unable to pay for his own transportation, will reach out to congregational nurse office for further assistance.  ? ?Honor Loh RN BSn PCCN  ?[place] Nurse ?[phone]-cell ?[phone]-office  ?

## 2022-03-08 ENCOUNTER — Other Ambulatory Visit (HOSPITAL_COMMUNITY): Payer: Self-pay

## 2022-03-08 ENCOUNTER — Emergency Department (HOSPITAL_COMMUNITY): Admission: EM | Admit: 2022-03-08 | Payer: Medicaid Other | Source: Home / Self Care

## 2022-03-08 ENCOUNTER — Telehealth: Payer: Self-pay | Admitting: Family Medicine

## 2022-03-08 ENCOUNTER — Other Ambulatory Visit: Payer: Self-pay

## 2022-03-08 ENCOUNTER — Telehealth: Payer: Self-pay | Admitting: Pharmacist

## 2022-03-08 ENCOUNTER — Encounter: Payer: Self-pay | Admitting: Hematology

## 2022-03-08 ENCOUNTER — Inpatient Hospital Stay (HOSPITAL_BASED_OUTPATIENT_CLINIC_OR_DEPARTMENT_OTHER): Payer: Medicaid Other | Admitting: Hematology

## 2022-03-08 ENCOUNTER — Inpatient Hospital Stay: Payer: Medicaid Other | Attending: Hematology

## 2022-03-08 ENCOUNTER — Inpatient Hospital Stay: Payer: Medicaid Other

## 2022-03-08 VITALS — BP 142/86 | HR 76 | Temp 98.8°F | Resp 18 | Ht 66.0 in | Wt 151.0 lb

## 2022-03-08 DIAGNOSIS — I81 Portal vein thrombosis: Secondary | ICD-10-CM | POA: Insufficient documentation

## 2022-03-08 DIAGNOSIS — N5089 Other specified disorders of the male genital organs: Secondary | ICD-10-CM | POA: Insufficient documentation

## 2022-03-08 DIAGNOSIS — C220 Liver cell carcinoma: Secondary | ICD-10-CM

## 2022-03-08 DIAGNOSIS — K5900 Constipation, unspecified: Secondary | ICD-10-CM | POA: Diagnosis not present

## 2022-03-08 DIAGNOSIS — K7460 Unspecified cirrhosis of liver: Secondary | ICD-10-CM | POA: Insufficient documentation

## 2022-03-08 DIAGNOSIS — I10 Essential (primary) hypertension: Secondary | ICD-10-CM | POA: Diagnosis not present

## 2022-03-08 DIAGNOSIS — N390 Urinary tract infection, site not specified: Secondary | ICD-10-CM | POA: Diagnosis not present

## 2022-03-08 DIAGNOSIS — E063 Autoimmune thyroiditis: Secondary | ICD-10-CM

## 2022-03-08 DIAGNOSIS — R079 Chest pain, unspecified: Secondary | ICD-10-CM | POA: Diagnosis not present

## 2022-03-08 DIAGNOSIS — Z79899 Other long term (current) drug therapy: Secondary | ICD-10-CM | POA: Insufficient documentation

## 2022-03-08 DIAGNOSIS — E039 Hypothyroidism, unspecified: Secondary | ICD-10-CM

## 2022-03-08 LAB — CBC WITH DIFFERENTIAL (CANCER CENTER ONLY)
Abs Immature Granulocytes: 0 10*3/uL (ref 0.00–0.07)
Basophils Absolute: 0 10*3/uL (ref 0.0–0.1)
Basophils Relative: 0 %
Eosinophils Absolute: 0.2 10*3/uL (ref 0.0–0.5)
Eosinophils Relative: 4 %
HCT: 36.8 % — ABNORMAL LOW (ref 39.0–52.0)
Hemoglobin: 12.3 g/dL — ABNORMAL LOW (ref 13.0–17.0)
Immature Granulocytes: 0 %
Lymphocytes Relative: 25 %
Lymphs Abs: 0.9 10*3/uL (ref 0.7–4.0)
MCH: 33.8 pg (ref 26.0–34.0)
MCHC: 33.4 g/dL (ref 30.0–36.0)
MCV: 101.1 fL — ABNORMAL HIGH (ref 80.0–100.0)
Monocytes Absolute: 0.6 10*3/uL (ref 0.1–1.0)
Monocytes Relative: 16 %
Neutro Abs: 1.9 10*3/uL (ref 1.7–7.7)
Neutrophils Relative %: 55 %
Platelet Count: 129 10*3/uL — ABNORMAL LOW (ref 150–400)
RBC: 3.64 MIL/uL — ABNORMAL LOW (ref 4.22–5.81)
RDW: 15.2 % (ref 11.5–15.5)
WBC Count: 3.4 10*3/uL — ABNORMAL LOW (ref 4.0–10.5)
nRBC: 0 % (ref 0.0–0.2)

## 2022-03-08 LAB — CMP (CANCER CENTER ONLY)
ALT: 31 U/L (ref 0–44)
AST: 60 U/L — ABNORMAL HIGH (ref 15–41)
Albumin: 3.4 g/dL — ABNORMAL LOW (ref 3.5–5.0)
Alkaline Phosphatase: 145 U/L — ABNORMAL HIGH (ref 38–126)
Anion gap: 4 — ABNORMAL LOW (ref 5–15)
BUN: 13 mg/dL (ref 8–23)
CO2: 26 mmol/L (ref 22–32)
Calcium: 8.9 mg/dL (ref 8.9–10.3)
Chloride: 105 mmol/L (ref 98–111)
Creatinine: 0.89 mg/dL (ref 0.61–1.24)
GFR, Estimated: >60 mL/min
Glucose, Bld: 97 mg/dL (ref 70–99)
Potassium: 4.3 mmol/L (ref 3.5–5.1)
Sodium: 135 mmol/L (ref 135–145)
Total Bilirubin: 0.7 mg/dL (ref 0.3–1.2)
Total Protein: 8.7 g/dL — ABNORMAL HIGH (ref 6.5–8.1)

## 2022-03-08 LAB — TSH: TSH: 9.504 u[IU]/mL — ABNORMAL HIGH (ref 0.320–4.118)

## 2022-03-08 LAB — T4, FREE: Free T4: 0.56 ng/dL — ABNORMAL LOW (ref 0.61–1.12)

## 2022-03-08 MED ORDER — LEVOTHYROXINE SODIUM 88 MCG PO TABS: 88.0000 ug | ORAL_TABLET | ORAL | 1 refills | Status: DC

## 2022-03-08 MED ORDER — LENVATINIB (12 MG DAILY DOSE) 3 X 4 MG PO CPPK
12.0000 mg | ORAL_CAPSULE | Freq: Every day | ORAL | 0 refills | Status: DC
  Filled 2022-03-08: qty 90, 30d supply, fill #0
  Filled 2022-03-08: qty 90, 90d supply, fill #0

## 2022-03-08 MED ORDER — LENVATINIB (12 MG DAILY DOSE) 3 X 4 MG PO CPPK
12.0000 mg | ORAL_CAPSULE | Freq: Every day | ORAL | 0 refills | Status: DC
  Filled 2022-03-08: qty 90, 90d supply, fill #0

## 2022-03-08 NOTE — Telephone Encounter (Signed)
TSH slightly elevated--has been on 75 mcg for 2 months. Will increase to 88 mcg. New Rx to pharmacy for dose.  ? ?[name]--could you please let Mr. Byas check dose next time you see this patient?  ? ?Thank you ?[name], MD  ?Family Medicine Teaching Service  ? ?

## 2022-03-08 NOTE — Telephone Encounter (Signed)
Oral Oncology Pharmacist Encounter ? ?Received new prescription for Lenvima (lenvatinib) for the treatment of hepatocellular carcinoma, unresectable, planned duration until disease progression or unacceptable drug toxicity. ? ?CBC w/ Diff and CMP from 03/08/22 assessed, no baseline dose adjustments required at this time. Prescription dose and frequency assessed for appropriateness. Appropriate for therapy initiation. Dose of 12 mg/d is appropriate given patient is >= 60kg. ? ?Current medication list in Epic reviewed, no relevant/significant ?DDIs with Lenvima identified. ? ?Evaluated chart and no patient barriers to medication adherence noted.  ? ?Patient agreement for treatment documented in MD note on 03/08/22. ? ?Prescription has been e-scribed to the [facility] for benefits analysis and approval. ? ?Oral Oncology Clinic will continue to follow for insurance authorization, copayment issues, initial counseling and start date. ? ?Leron Croak, PharmD, BCPS ?Hematology/Oncology Clinical Pharmacist ?[facility] Oral Chemotherapy Navigation Clinics ?[phone] ?03/08/2022 12:23 PM ? ?

## 2022-03-08 NOTE — Telephone Encounter (Addendum)
Oral Chemotherapy Pharmacist Encounter ? ?I spoke with patient, Jim Little, and interpreter for overview of: Lenvima (lenvatinib) for the treatment of hepatocellular carcinoma, unresectable, planned duration until disease progression or unacceptable toxicity.  ? ?Counseled patient on administration, dosing, side effects, monitoring, drug-food interactions, safe handling, storage, and disposal. ? ?Patient will dose-escalate Lenvima when starting. For the first two days of starting the [facility], he will take 4 mg capsules, 2 capsules (8 mg total) by mouth once daily. On the third day and thereafter, if tolerated, he will increase to 3 capsules (12 mg total) by mouth once daily, with or without food, at approximately the same time each day.  ? ?Lenvima start date: 03/13/22 ? ?Adverse effects include but are not limited to: hypertension, hand-foot syndrome, diarrhea, joint pain, fatigue, headache, decreased blood counts, changes in electrolytes and cardiac conduction issues.   ? ?Patient will obtain anti diarrheal and alert the office of 4 or more loose stools above baseline. ? ?Patient instructed to notify office of any upcoming invasive procedures. [name] will be held for 6 days prior to scheduled surgery, restart based on healing and clinical judgement.  ? ?Reviewed with patient importance of keeping a medication schedule and plan for any missed doses. No barriers to medication adherence identified. ? ?Medication reconciliation performed and medication/allergy list updated. ? ?Insurance authorization for [name] has been obtained. ?This will ship from the [facility] on 03/11/22 to deliver to patient's home on 03/12/22. ? ?Patient informed the pharmacy will reach out 5-7 days prior to needing next fill of Lenvima to coordinate continued medication acquisition to prevent break in therapy. ? ?All questions answered. ? ?[name] voiced understanding and appreciation.  ? ?Medication education  handout will be placed in mail for patient. Patient knows to call the office with questions or concerns. Oral Chemotherapy Clinic phone number will be provided to patient.  ? ?Leron Croak, PharmD, BCPS ?Hematology/Oncology Clinical Pharmacist ?[facility] Oral Chemotherapy Navigation Clinics ?[phone] ?03/08/2022 12:30 PM ? ?

## 2022-03-08 NOTE — Congregational Nurse Program (Signed)
?  Dept: [phone] ? ? ?Congregational Nurse Program Note ? ?Date of Encounter: 03/08/2022 ? ?Past Medical History: ?Past Medical History:  ?Diagnosis Date  ? BPH (benign prostatic hyperplasia)   ? Cirrhosis ([place])   ? Hypertension   ? Splenomegaly   ? ? ?Encounter Details: ? CNP Questionnaire - 03/08/22 1400   ? ?  ? Questionnaire  ? Do you give verbal consent to treat you today? Yes   ? Location Patient Served  [facility]   ? Visit Setting Phone/Text/Email   ? Patient Status Refugee   ? Insurance Medicaid   ? Insurance Referral Medicaid   ? Medication Have Medication Insecurities;Provided Medication Assistance;Referred to Medication Assistance   ? Medical Provider Yes   ? Screening Referrals N/A   ? Medical Referral N/A   ? Medical Appointment Made Other   ? Food N/A   ? Transportation Need transportation assistance;Provided transportation assistance;Referred to transportation service   ? Housing/Utilities No permanent housing   ? Interpersonal Safety N/A   ? Intervention Advocate;Navigate Healthcare System;Case Management;Counsel;Educate;Support   ? ED Visit Averted Yes   ? Life-Saving Intervention Made N/A   ? ?  ?  ? ?  ? ?Ride to return home after was booked by Dr Ernestina Penna office staff. Unfortunately patient was dropped at his old address [address] instead of his new address [address]I am unable to access the transportation portal to update the address. I will contact [facility] transportation services. I have personally called [employer] to drop patient to the right address and confirmed that the patient arrived safely. ? ?Honor Loh RN BSn PCCN  ?[place] Nurse ?[phone]-cell ?[phone]-office ? ? ? ?

## 2022-03-08 NOTE — Progress Notes (Signed)
?[place]   ?Telephone:([phone]) [phone] [phone]   ?Clinic Follow up Note  ? ?Patient Care Team: ?[name], MD as PCP - General (Family Medicine) ?Jonnie Finner, RN (Inactive) as Oncology Nurse Navigator ?Heilingoetter, Tobe Sos, PA-C as Librarian, academic (Physician Environmental consultant) ?Truitt Merle, MD as Consulting Physician (Oncology) ? ?Date of Service:  03/08/2022 ? ?CHIEF COMPLAINT: f/u of [place] ? ?CURRENT THERAPY:  ?To start Lenvatinib ? ?ASSESSMENT & PLAN:  ?Jim Little is a 76 y.o. male with  ? ?1. Hepatocellular Carcinoma, unresectable, stage IIIA, with node metastasis  ?-Based on MRI liver 01/12/21 with Advanced cirrhotic changes involving the liver and Large infiltrating mass versus numerous confluent masses involving the right hepatic lobe. This is consistent with hepatocellular carcinoma. Associated thrombosed middle and portal veins. No definite left hepatic lobe lesions. AFP (01/14/21) 11,904.0. ?-His 01/15/21 CT of the chest did not show metastatic disease to the lungs. Staging workup completed. ?-He has unclear etiology of cirrhosis, which is followed by Dr. Therisa Doyne. Hep B surface antigen non reactive (05/15/20), ANA negative, Hep C ab non reactive. No alcohol abuse. ?-He is s/p Y 90 radioembolization on 03/08/21. His AFP has dropped significantly after Y90 ?-MRI scan from 05/29/21 showed good response in liver, but he developed hepatoduodenal ligament lymph node measuring 2.0 x 1.7 cm, likely metastatic disease   ?-given his extrahepatic metastasis, I recommend systemic treatment Tecentriq and Avastin. Pt has not been compliant with oral medicine, oral TKI will be challenge for him. He had a EGD in 07/2020, showed no significant varices, okay to proceed Avastin ?-He started Tecentriq and Avastin on 06/29/21. He has tolerated relatively well, with constipation. ?-CT AP on 11/28/21 showed: no convincing evidence of residual or recurrent enhancement; no new enhancement in liver; similar  enlarged portacaval lymph node. ?-His AFP has been rising lately, concerning for cancer progression. Chest CT 12/28/21 was negative. CT AP on 03/06/22 showed: unchanged right liver mass; significant enlargement of porta hepatis node; unchanged smaller RP nodes. I reviewed the results and images with him today. ?-I discussed changing treatment with him today, options include an intravenous ramucirumab or several oral TKIs. I recommend changing to lenvatinib.  I discussed the benefit and potential side effects with him, he agrees to proceed.  I will have our oral pharmacist speak to him via WebEx here, and we will contact his congregational nurse [name] to be involved in the conversation. ?-Given his well-controlled cancer in the liver, and only progressed in portal lymph node, I will discuss his case in GI conference to see if he is a candidate for radiation to the node  ?  ?2. Symptom Management: Constipation, Central chest pain ?-constipation improved, he has lactulose.  ?-he reports a constant chest pain, rated about 5/10. Chest CT on 12/28/21 showed no acute findings or metastatic disease. ?-his BP has been more elevated in the clinic lately. I prescribed amlodipine for him on 01/03/22. He previously told me he takes it every day except on days he comes here. ?  ?3. UTI, h/o of urinary retention ?-UA on 01/03/22 was positive. He was treated with a 5 day course of Macrobid. ?-he had foley catheter placed on 3/13, and it was removed 02/14/22. ?-LUTS improved. He is followed by [facility]. ?  ?4. Scrotal swelling ?-developed following Y90 treatment ?-his scrotal US from 11/02/21, showed left epididymis enlargement, may be due to epididymitis, and extensive microlithiasis ?-f/u with urology  ?  ?5. Social Support, Language barrier ?-From  the [place], was at a refugee camp in [place] ?-Has several family members living in single house. 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 Neither he nor his family speak [place]  ?-He speaks Swahili. He has [facility]ational Nurse, Honor Loh, who helps him. Her contact is [phone] ?  ?6. Portal vein thrombosis, Cirrhosis  ?-Seen with direct admission from 01/13/21-01/16/21 ?-Patient received heparin drip inpatient. Dr. Marin Olp did not recommend anticoagulation since the thrombus is tumor thrombus. ?-His liver cirrhosis is followed by Dr. Therisa Doyne in GI. Prior work up with EGD/Colonoscopy in 07/2020 which showed no varices  ?  ?  ?PLAN: ?-CT scan reviewed, unfortunately he has disease progression.   ?-Cancel treatment today ?-start lenvatinib, I called in today, he will start at 8 mg daily for first 2 weeks, if tolerates well, will increase to full dose at 12 mg daily ?-lab and f/u in 3 weeks ? ? ?No problem-specific Assessment & Plan notes found for this encounter. ? ? ?SUMMARY OF ONCOLOGIC HISTORY: ?Oncology History Overview Note  ?Cancer Staging ?Hepatocellular carcinoma ([name]) ?Staging form: Liver, AJCC 8th Edition ?- Clinical stage from 02/09/2021: Stage IIIA (cT3, cN0, cM0) - Signed by Truitt Merle, MD on 02/09/2021 ?Stage prefix: Initial diagnosis ? ?  ?Hepatocellular carcinoma ([place])  ?05/12/2020 Imaging  ? MRI Liver  ?IMPRESSION: ?1. Geographic lesion in the inferior medial aspect of the RIGHT ?hepatic lobe has some imaging features suggesting focal fatty ?infiltration. However, atypical enhancement pattern. Recommend ?follow-up contrast MRI in 3 to 6 months to re-evaluate. ?2. No additional lesion liver. ?3. Morphologic changes consistent with cirrhosis. ?  ?01/12/2021 Imaging  ? MRI Abdomen  ?IMPRESSION: ?1. Advanced cirrhotic changes involving the liver. There is a large ?infiltrating mass versus numerous confluent masses involving the ?right hepatic lobe and consistent with hepatocellular carcinoma. ?Associated thrombosed middle and portal veins. No definite left ?hepatic lobe lesions. LI-RADS 5. ?2. Borderline  splenomegaly. ?3. No ascites or abdominal wall hernia. ?  ?  ?01/14/2021 Imaging  ? US Liver Doppler  ?IMPRESSION: ?Directed duplex of the hepatic vasculature confirms right portal ?vein and right hepatic vein occlusion, presumably from tumor ?thrombus given the right-sided liver tumor on MRI. The ultrasound ?correlate of the previously demonstrated right liver tumor is ?heterogeneous tissue with ill-defined margin. ?  ?In addition to the right-sided tumor, there are several small ?hyperechoic foci of approximately 1 cm, potentially additional HCC ?foci versus regenerative nodules. ?  ?Cirrhosis and splenomegaly ?  ?01/15/2021 Imaging  ? CT Chest  ?IMPRESSION: ?1. No evidence of metastatic disease in the chest. ?2. Cirrhotic morphology of the liver with ill-defined, heterogeneous ?mass of the right lobe of the liver and occlusion of the right ?portal vein, in keeping with known hepatocellular carcinoma, better ?demonstrated by recent MR. ?  ?02/09/2021 Initial Diagnosis  ? Hepatocellular carcinoma ([place]) ? ?  ?02/09/2021 Cancer Staging  ? Staging form: Liver, AJCC 8th Edition ?- Clinical stage from 02/09/2021: Stage IIIA (cT3, cN0, cM0) - Signed by Truitt Merle, MD on 02/09/2021 ?Stage prefix: Initial diagnosis ? ?  ?05/29/2021 Imaging  ? MRI Abdomen ? ?IMPRESSION: ?1. Interval decrease in size of the infiltrating mass in the right ?hepatic lobe showing heterogeneous arterial phase hyperenhancement. Persistent arterial phase enhancement can be seen early post y 41 embolization. No substantial restricted diffusion in the lesion on today's study. ?2. Tiny focus of arterial phase hyperenhancement in the dome of the liver near the junction of  segment IV and VIII. Unclear whether this was present previously given the marked motion degradation on the earlier study. Close attention on follow-up recommended. ?3. Thrombosis of the right hepatic and portal vein again noted, now with filling defect in the IVC at the hepatic vein  confluence ?extending up the IVC to the level of the IVC/RA junction. Subtraction postcontrast imaging suggests that there may be some enhancement in the thrombus suggesting tumor thrombus or a combination of tumor and bland

## 2022-03-09 ENCOUNTER — Telehealth: Payer: Self-pay | Admitting: Family Medicine

## 2022-03-09 DIAGNOSIS — Z5982 Transportation insecurity: Secondary | ICD-10-CM

## 2022-03-09 DIAGNOSIS — C220 Liver cell carcinoma: Secondary | ICD-10-CM

## 2022-03-09 LAB — AFP TUMOR MARKER: AFP, Serum, Tumor Marker: 11973 ng/mL — ABNORMAL HIGH (ref 0.0–8.4)

## 2022-03-09 NOTE — Telephone Encounter (Signed)
The patient speaks Swahili as their primary language.  An interpreter was used for the entire visit. Spoke with patient and wife. Reviewed change in dosing.  ?All questions answered.  ? ?While on phone with wife, she reported that as hospital has cut transportation services and the cost of taxi is too high. Will place another CCM referral to see if we can help family get set up with Medicaid transport.  ? ?[name], MD  ?Family Medicine Teaching Service  ? ?

## 2022-03-11 ENCOUNTER — Other Ambulatory Visit (HOSPITAL_COMMUNITY): Payer: Self-pay

## 2022-03-12 ENCOUNTER — Telehealth: Payer: Self-pay

## 2022-03-12 ENCOUNTER — Telehealth: Payer: Self-pay | Admitting: Hematology

## 2022-03-12 NOTE — Telephone Encounter (Signed)
I have contacted [employer] regarding Levothyroxine 88 mcg. This medication has not been delivered to patient yet. I have requested a delivery as soon as possible. ? ?Honor Loh RN BSn PCCN  ?[place] Nurse ?[phone]-cell ?[phone]-office ? ?

## 2022-03-12 NOTE — Telephone Encounter (Signed)
Scheduled follow-up appointment per 4/14 los. [name] Acupuncturist) is aware. ?

## 2022-03-13 ENCOUNTER — Telehealth: Payer: Self-pay

## 2022-03-13 NOTE — Telephone Encounter (Signed)
Confirmed that Lenvatinib was delivered yesterday. Patient self-administered first dose of 2 capsules today 03-13-2022. Advised to take 2 capsules tomorrow per instructions, and advised to take 3 capsules daily thereafter. Patient verbalized understanding. ? ?Honor Loh RN BSn PCCN  ?[place] Nurse ?[phone]-cell ?[phone]-office ? ?

## 2022-03-15 ENCOUNTER — Other Ambulatory Visit (HOSPITAL_COMMUNITY): Payer: Self-pay

## 2022-03-15 ENCOUNTER — Other Ambulatory Visit: Payer: Self-pay | Admitting: Family Medicine

## 2022-03-15 MED ORDER — AMLODIPINE BESYLATE 10 MG PO TABS: 10.0000 mg | ORAL_TABLET | Freq: Every day | ORAL | 3 refills | Status: AC

## 2022-03-17 NOTE — Congregational Nurse Program (Signed)
?  Dept: [phone] ? ? ?Congregational Nurse Program Note ? ?Date of Encounter: 03/15/22 ? ?Past Medical History: ?Past Medical History:  ?Diagnosis Date  ? BPH (benign prostatic hyperplasia)   ? Cirrhosis ([place])   ? Hypertension   ? Splenomegaly   ? ? ?Encounter Details: ? CNP Questionnaire - 03/15/22 1000   ? ?  ? Questionnaire  ? Do you give verbal consent to treat you today? Yes   ? Location Patient Served  [facility]   ? Visit Setting Home   ? Patient Status Refugee   ? Insurance Medicaid   ? Insurance Referral Medicaid   ? Medication Have Medication Insecurities;Provided Medication Assistance;Referred to Medication Assistance   ? Medical Provider Yes   ? Screening Referrals N/A   ? Medical Referral N/A   ? Medical Appointment Made Other   ? Food N/A   ? Transportation Need transportation assistance;Provided transportation assistance;Referred to transportation service   ? Housing/Utilities No permanent housing   ? Interpersonal Safety N/A   ? Intervention Advocate;Navigate Healthcare System;Case Management;Counsel;Educate;Support   ? ED Visit Averted Yes   ? Life-Saving Intervention Made N/A   ? ?  ?  ? ?  ? ?03/15/22- Home visit done and medication inspected. Noted that patient run out amlodipine. Dr Owens Shark made aware and ordered.[facility] will deliver on Monday. ? ?Honor Loh RN BSN PCCN  ?[place] Nurse ?[phone]-cell ?[phone]-office ? ? ? ?

## 2022-03-18 NOTE — Congregational Nurse Program (Signed)
?  Dept: [phone] ? ? ?Congregational Nurse Program Note ? ?Date of Encounter: 03/18/2022 ? ?Past Medical History: ?Past Medical History:  ?Diagnosis Date  ? BPH (benign prostatic hyperplasia)   ? Cirrhosis ([place])   ? Hypertension   ? Splenomegaly   ? ? ?Encounter Details: ? CNP Questionnaire - 03/18/22 1900   ? ?  ? Questionnaire  ? Do you give verbal consent to treat you today? Yes   ? Location Patient Served  [facility]   ? Visit Setting Phone/Text/Email   ? Patient Status Refugee   ? Insurance Medicaid   ? Insurance Referral Medicaid   ? Medication Have Medication Insecurities;Provided Medication Assistance;Referred to Medication Assistance   ? Medical Provider Yes   ? Screening Referrals N/A   ? Medical Referral N/A   ? Medical Appointment Made Other   ? Food N/A   ? Transportation Need transportation assistance;Provided transportation assistance;Referred to transportation service   ? Housing/Utilities No permanent housing   ? Interpersonal Safety N/A   ? Intervention Advocate;Navigate Healthcare System;Case Management;Counsel;Educate;Support   ? ED Visit Averted Yes   ? Life-Saving Intervention Made N/A   ? ?  ?  ? ?  ? ?I have contacted patient and confirmed that Amlodipine has been delivered to patient. Advised patient to take medication daily as ordered and start today. ? ?Honor Loh RN BSN PCCN  ?[place] Nurse ?[phone]-cell ?[phone]-office ? ? ? ?

## 2022-03-20 ENCOUNTER — Other Ambulatory Visit: Payer: Self-pay

## 2022-03-20 NOTE — Progress Notes (Signed)
The proposed treatment discussed in conference is for discussion purpose only and is not a binding recommendation.  The patients have not been physically examined, or presented with their treatment options.  Therefore, final treatment plans cannot be decided.  

## 2022-03-21 ENCOUNTER — Other Ambulatory Visit: Payer: Self-pay

## 2022-03-21 NOTE — Patient Outreach (Signed)
Care Coordination ? ?03/21/2022 ? ?[name] ?1946/09/02 ?[id number] ? ? ?Medicaid Managed Care  ? ?Unsuccessful Outreach Note ? ?03/21/2022 ?Name: Jim Little MRN: [id number] DOB: 04/23/1946 ? ?Referred by: [name], MD ?Reason for referral : High Risk Managed Medicaid (MM Social work Unsuccessful telephone outreach) ? ? ?An unsuccessful telephone outreach was attempted today. The patient was referred to the case management team for assistance with care management and care coordination.  ? ?Follow Up Plan: The care management team will reach out to the patient again over the next 7 days.  ? ?Mickel Fuchs, BSW, MHA ?[place]  ?High Risk Managed Medicaid Team  ?[phone]  ?

## 2022-03-21 NOTE — Patient Instructions (Signed)
Visit Information ? ?Mr. Jim Little  - as a part of your Medicaid benefit, you are eligible for care management and care coordination services at no cost or copay. I was unable to reach you by phone today but would be happy to help you with your health related needs. Please feel free to call me @ [phone] ? ?A member of the Managed Medicaid care management team will reach out to you again over the next 7 days.  ? ?Mickel Fuchs, BSW, MHA ?[place]  ?High Risk Managed Medicaid Team  ?[phone]  ?

## 2022-03-26 ENCOUNTER — Other Ambulatory Visit (HOSPITAL_COMMUNITY): Payer: Self-pay

## 2022-03-27 NOTE — Congregational Nurse Program (Signed)
?  Dept: [phone] ? ? ?Congregational Nurse Program Note ? ?Date of Encounter: 03/27/2022 ? ?Past Medical History: ?Past Medical History:  ?Diagnosis Date  ? BPH (benign prostatic hyperplasia)   ? Cirrhosis ([place])   ? Hypertension   ? Splenomegaly   ? ? ?Encounter Details: ? CNP Questionnaire - 03/26/22 1430   ? ?  ? Questionnaire  ? Do you give verbal consent to treat you today? Yes   ? Location Patient Served  [facility]   ? Visit Setting Home   ? Patient Status Refugee   ? Insurance Medicaid   ? Insurance Referral Medicaid   ? Medication Have Medication Insecurities;Provided Medication Assistance   ? Medical Provider Yes   ? Screening Referrals N/A   ? Medical Referral N/A   ? Medical Appointment Made Other   ? Food N/A   ? Transportation Need transportation assistance   ? Housing/Utilities No permanent housing   ? Interpersonal Safety N/A   ? Intervention Advocate;Navigate Healthcare System;Case Management;Counsel;Educate;Support   ? ED Visit Averted Yes   ? Life-Saving Intervention Made N/A   ? ?  ?  ? ?  ? ? ?Home visit completed. Medication inspected.Taking all medication as ordered.Interpreted pharmacy call regarding lenvatinib 12 mg daily dose. Patient has been compliant with this medication and reports no issues. ? ?Honor Loh RN BSN PCCN  ?[place] Nurse ?[phone]-cell ?[phone]-office ? ? ?

## 2022-03-28 ENCOUNTER — Other Ambulatory Visit: Payer: Self-pay | Admitting: Hematology

## 2022-03-28 ENCOUNTER — Other Ambulatory Visit (HOSPITAL_COMMUNITY): Payer: Self-pay

## 2022-03-28 DIAGNOSIS — C220 Liver cell carcinoma: Secondary | ICD-10-CM

## 2022-03-28 MED ORDER — LENVIMA (12 MG DAILY DOSE) 3 X 4 MG PO CPPK
12.0000 mg | ORAL_CAPSULE | Freq: Every day | ORAL | 0 refills | Status: DC
  Filled 2022-04-01: qty 90, 90d supply, fill #0

## 2022-03-31 NOTE — Progress Notes (Deleted)
[place]   Telephone:(336) [phone] Fax:(336) [phone]   Clinic Follow up Note   Patient Care Team: [name], MD as PCP - General (Family Medicine) Jonnie Finner, RN (Inactive) as Oncology Nurse Navigator Heilingoetter, Tobe Sos, PA-C as Physician Assistant (Physician Assistant) Truitt Merle, MD as Consulting Physician (Oncology) 03/31/2022  CHIEF COMPLAINT: Follow up [place]  SUMMARY OF ONCOLOGIC HISTORY: Oncology History Overview Note  Cancer Staging Hepatocellular carcinoma [facility]) Staging form: Liver, AJCC 8th Edition - Clinical stage from 02/09/2021: Stage IIIA (cT3, cN0, cM0) - Signed by Truitt Merle, MD on 02/09/2021 Stage prefix: Initial diagnosis    Hepatocellular carcinoma ([place])  05/12/2020 Imaging   MRI Liver  IMPRESSION: 1. Geographic lesion in the inferior medial aspect of the RIGHT hepatic lobe has some imaging features suggesting focal fatty infiltration. However, atypical enhancement pattern. Recommend follow-up contrast MRI in 3 to 6 months to re-evaluate. 2. No additional lesion liver. 3. Morphologic changes consistent with cirrhosis.   01/12/2021 Imaging   MRI Abdomen  IMPRESSION: 1. Advanced cirrhotic changes involving the liver. There is a large infiltrating mass versus numerous confluent masses involving the right hepatic lobe and consistent with hepatocellular carcinoma. Associated thrombosed middle and portal veins. No definite left hepatic lobe lesions. LI-RADS 5. 2. Borderline splenomegaly. 3. No ascites or abdominal wall hernia.     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 The ultrasound correlate of the previously demonstrated right liver tumor is heterogeneous tissue with ill-defined margin.   In addition to the right-sided tumor, there  are several small hyperechoic foci of approximately 1 cm, potentially additional [place] foci versus regenerative nodules.   Cirrhosis and splenomegaly   01/15/2021 Imaging   CT Chest  IMPRESSION: 1. No evidence of metastatic disease in the chest. 2. Cirrhotic morphology of the liver with ill-defined, heterogeneous mass of the right lobe of the liver and occlusion of the right portal vein, in keeping with known hepatocellular carcinoma, better demonstrated by recent MR.   02/09/2021 Initial Diagnosis   Hepatocellular carcinoma ([place])    02/09/2021 Cancer Staging   Staging form: Liver, AJCC 8th Edition - Clinical stage from 02/09/2021: Stage IIIA (cT3, cN0, cM0) - Signed by Truitt Merle, MD on 02/09/2021 Stage prefix: Initial diagnosis    05/29/2021 Imaging   MRI Abdomen  IMPRESSION: 1. Interval decrease in size of the infiltrating mass in the right hepatic lobe showing heterogeneous arterial phase hyperenhancement. Persistent arterial phase enhancement can be seen early post y 28 embolization. No substantial restricted diffusion in the lesion on today's study. 2. Tiny focus of arterial phase hyperenhancement in the dome of the liver near the junction of segment IV and VIII. Unclear whether this was present previously given the marked motion degradation on the earlier study. Close attention on follow-up recommended. 3. 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 Subtraction postcontrast imaging suggests that there may be some enhancement in the thrombus suggesting tumor thrombus or a combination of tumor and bland thrombus. 4. Interval increase in size of the hepatoduodenal ligament lymph node measuring 2.0 x 1.7 cm. Likely metastatic disease,  continued close attention on follow-up recommended. 5. Markedly distended urinary bladder.   06/29/2021 -  Chemotherapy   Patient is on Treatment Plan : LUNG Atezolizumab + Bevacizumab q21d Maintenance       11/28/2021 Imaging   EXAM: CT ABDOMEN AND PELVIS WITH CONTRAST  IMPRESSION: 1. Cirrhotic morphology of the liver with redemonstrated post procedural findings of right hepatic lobe embolization resulting in substantial atrophy of the right lobe of the liver. 2. Generally unchanged appearance of a hypoenhancing mass in the peripheral right lobe following embolization, without convincing evidence of residual or recurrent contrast enhancement. 3. No new suspicious contrast enhancement in the liver. 4. No significant change in an enlarged hepatoduodenal or portacaval lymph node measuring 3.5 x 2.5 cm, likely a nodal metastasis. 5. Additional smaller, nonspecific porta hepatis and left retroperitoneal lymph nodes. Attention on follow-up. 6. Prostatomegaly with thickening of the urinary bladder wall and hyperenhancement of the mucosa. Correlate for urinalysis evidence of infectious or inflammatory cystitis   Aortic Atherosclerosis (ICD10-I70.0).   03/06/2022 Imaging   EXAM: CT ABDOMEN AND PELVIS WITH CONTRAST  IMPRESSION: 1. Cirrhotic morphology of the liver. Unchanged appearance of a hypoenhancing mass of the posterior right lobe of the liver without arterial hyperenhancement following radioembolization. The right portal vein remains occluded. 2. No new arterial hyperenhancement in the liver. 3. Significant interval enlargement of a bulky porta hepatis node, measuring 5.4 x 3.8 cm, previously 3.5 x 2.5 cm, consistent with worsened nodal metastatic disease. Unchanged smaller porta hepatis and left retroperitoneal lymph nodes, which remain nonspecific although suspicious for metastases. 4. Thickening and hyperenhancement of the urinary bladder, again suggestive of nonspecific infectious or inflammatory cystitis, although possibly related to chronic outlet obstruction. Correlate with  urinalysis if clinically referable signs and symptoms are present.     CURRENT THERAPY: Lenvatinib, starting 02/2022  INTERVAL HISTORY: Ms. Popwell returns for follow up as scheduled. Last seen by Dr. Burr Medico 03/08/22 and began lenvatinib.    REVIEW OF SYSTEMS:   Constitutional: Denies fevers, chills or abnormal weight loss Eyes: Denies blurriness of vision Ears, nose, mouth, throat, and face: Denies mucositis or sore throat Respiratory: Denies cough, dyspnea or wheezes Cardiovascular: Denies palpitation, chest discomfort or lower extremity swelling Gastrointestinal:  Denies nausea, heartburn or change in bowel habits Skin: Denies abnormal skin rashes Lymphatics: Denies new lymphadenopathy or easy bruising Neurological:Denies numbness, tingling or new weaknesses Behavioral/Psych: Mood is stable, no new changes  All other systems were reviewed with the patient and are negative.  MEDICAL HISTORY:  Past Medical History:  Diagnosis Date   BPH (benign prostatic hyperplasia)    Cirrhosis ([place])    Hypertension    Splenomegaly     SURGICAL HISTORY: Past Surgical History:  Procedure Laterality Date   BIOPSY  08/22/2020   Procedure: BIOPSY;  Surgeon: Ronnette Juniper, MD;  Location: [facility];  Service: Gastroenterology;;   COLONOSCOPY WITH PROPOFOL N/A 08/22/2020   Procedure: COLONOSCOPY WITH PROPOFOL;  Surgeon: Ronnette Juniper, MD;  Location: [facility];  Service: Gastroenterology;  Laterality: N/A;   ESOPHAGOGASTRODUODENOSCOPY (EGD) WITH PROPOFOL N/A 08/22/2020   Procedure: ESOPHAGOGASTRODUODENOSCOPY (EGD) WITH PROPOFOL;  Surgeon: Ronnette Juniper, MD;  Location: [facility];  Service: Gastroenterology;  Laterality: N/A;   IR ANGIOGRAM SELECTIVE EACH ADDITIONAL VESSEL  03/01/2021   IR ANGIOGRAM SELECTIVE EACH ADDITIONAL VESSEL  03/08/2021   IR ANGIOGRAM VISCERAL SELECTIVE  03/01/2021   IR ANGIOGRAM VISCERAL SELECTIVE  03/01/2021   IR ANGIOGRAM VISCERAL SELECTIVE  03/08/2021   IR EMBO TUMOR ORGAN  ISCHEMIA INFARCT INC GUIDE ROADMAPPING  03/08/2021   IR RADIOLOGIST EVAL & MGMT  02/06/2021  IR RADIOLOGIST EVAL & MGMT  05/31/2021   IR US GUIDE VASC ACCESS RIGHT  03/01/2021   IR US GUIDE VASC ACCESS RIGHT  03/08/2021   UPPER GASTROINTESTINAL ENDOSCOPY  03/2018   [place] --candidiasis plus esophageal varices grade 2.  Treated with fluconazole and status post esophageal banding.   US ECHOCARDIOGRAPHY  10/2016   per overseas records from [place] - LVEF 68%; mildly calcified aortic valve, normal function otherwise    I have reviewed the social history and family history with the patient and they are unchanged from previous note.  ALLERGIES:  is allergic to pork-derived products.  MEDICATIONS:  Current Outpatient Medications  Medication Sig Dispense Refill   acetaminophen (TYLENOL) 500 MG tablet Take 500 mg by mouth 2 (two) times daily as needed for mild pain.     amLODipine (NORVASC) 10 MG tablet Take 1 tablet (10 mg total) by mouth daily. 90 tablet 3   lactulose (KRISTALOSE) 10 g packet TAKE 1 PACKET (10 G TOTAL) BY MOUTH AS NEEDED (CONSTIPATION). DISSOLVE IN EIGHT OUNCES OF WATER 30 each 1   lenvatinib 12 mg daily dose (LENVIMA, 12 MG DAILY DOSE,) 3 x 4 MG capsule Take 12 mg by mouth daily. Take as instructed by MD 90 capsule 0   levothyroxine (SYNTHROID) 88 MCG tablet Take 1 tablet (88 mcg total) by mouth every morning. 30 minutes before food 60 tablet 1   nitrofurantoin, macrocrystal-monohydrate, (MACROBID) 100 MG capsule Take 1 capsule (100 mg total) by mouth 2 (two) times daily. 10 capsule 0   Olopatadine HCl (PAZEO) 0.7 % SOLN Use once daily in both eyes 2.5 mL 0   omeprazole (PRILOSEC) 40 MG capsule Take 1 capsule (40 mg total) by mouth daily. 90 capsule 3   tamsulosin (FLOMAX) 0.4 MG CAPS capsule Take 1 capsule (0.4 mg total) by mouth daily. Please pack and mail to home 90 capsule 3   traMADol (ULTRAM) 50 MG tablet Take 1 tablet (50 mg total) by mouth every 12 (twelve) hours  as needed for severe pain. 15 tablet 0   No current facility-administered medications for this visit.   Facility-Administered Medications Ordered in Other Visits  Medication Dose Route Frequency Provider Last Rate Last Admin   sodium chloride flush (NS) 0.9 % injection 10 mL  10 mL Intracatheter PRN Truitt Merle, MD   10 mL at 09/19/21 1604    PHYSICAL EXAMINATION: ECOG PERFORMANCE STATUS: {CHL ONC ECOG [phone]}  There were no vitals filed for this visit. There were no vitals filed for this visit.  GENERAL:alert, no distress and comfortable SKIN: skin color, texture, turgor are normal, no rashes or significant lesions EYES: normal, Conjunctiva are pink and non-injected, sclera clear OROPHARYNX:no exudate, no erythema and lips, buccal mucosa, and tongue normal  NECK: supple, thyroid normal size, non-tender, without nodularity LYMPH:  no palpable lymphadenopathy in the cervical, axillary or inguinal LUNGS: clear to auscultation and percussion with normal breathing effort HEART: regular rate & rhythm and no murmurs and no lower extremity edema ABDOMEN:abdomen soft, non-tender and normal bowel sounds Musculoskeletal:no cyanosis of digits and no clubbing  NEURO: alert & oriented x 3 with fluent speech, no focal motor/sensory deficits  LABORATORY DATA:  I have reviewed the data as listed    Latest Ref Rng & Units 03/08/2022   10:32 AM 02/14/2022    1:24 PM 01/23/2022    1:35 PM  CBC  WBC 4.0 - 10.5 K/uL 3.4   7.1   4.5  Hemoglobin 13.0 - 17.0 g/dL 12.3   12.8   13.5    Hematocrit 39.0 - 52.0 % 36.8   38.1   39.2    Platelets 150 - 400 K/uL 129   123   154          Latest Ref Rng & Units 03/08/2022   10:32 AM 02/14/2022    1:24 PM 01/23/2022    1:35 PM  CMP  Glucose 70 - 99 mg/dL 97   160   106    BUN 8 - 23 mg/dL '13   14   18    '$ Creatinine 0.61 - 1.24 mg/dL 0.89   0.77   0.92    Sodium 135 - 145 mmol/L 135   135   137    Potassium 3.5 - 5.1 mmol/L 4.3   4.0   4.4     Chloride 98 - 111 mmol/L 105   105   104    CO2 22 - 32 mmol/L '26   25   25    '$ Calcium 8.9 - 10.3 mg/dL 8.9   9.2   9.0    Total Protein 6.5 - 8.1 g/dL 8.7   8.7   8.4    Total Bilirubin 0.3 - 1.2 mg/dL 0.7   0.7   0.9    Alkaline Phos 38 - 126 U/L 145   152   152    AST 15 - 41 U/L 60   54   63    ALT 0 - 44 U/L 31   29   32        RADIOGRAPHIC STUDIES: I have personally reviewed the radiological images as listed and agreed with the findings in the report. No results found.   ASSESSMENT & PLAN:  No problem-specific Assessment & Plan notes found for this encounter.   No orders of the defined types were placed in this encounter.  All questions were answered. The patient knows to call the clinic with any problems, questions or concerns. No barriers to learning was detected. I spent {CHL ONC TIME VISIT - WUJWJ:[id number]} counseling the patient face to face. The total time spent in the appointment was {CHL ONC TIME VISIT - OZHYQ:[id number]} and more than 50% was on counseling and review of test results     Alla Feeling, NP 03/31/22

## 2022-04-01 ENCOUNTER — Other Ambulatory Visit: Payer: Self-pay

## 2022-04-01 ENCOUNTER — Other Ambulatory Visit (HOSPITAL_COMMUNITY): Payer: Self-pay

## 2022-04-01 ENCOUNTER — Telehealth: Payer: Self-pay | Admitting: Nurse Practitioner

## 2022-04-01 NOTE — Patient Outreach (Signed)
Care Coordination ? ?04/01/2022 ? ?[name] ?June 05, 1946 ?[id number] ? ? ?Medicaid Managed Care  ? ?Unsuccessful Outreach Note ? ?04/01/2022 ?Name: Jim Little MRN: [id number] DOB: [birth year] ? ?Referred by: [name], MD ?Reason for referral : High Risk Managed Medicaid (MM Social work Unsuccessful telephone outreach) ? ? ?A second unsuccessful telephone outreach was attempted today. The patient was referred to the case management team for assistance with care management and care coordination.  ? ?Follow Up Plan: The care management team will reach out to the patient again over the next 7 days.  ? ?Mickel Fuchs, BSW, MHA ?[place]  ?High Risk Managed Medicaid Team  ?[phone]  ?

## 2022-04-01 NOTE — Patient Instructions (Signed)
Visit Information ? ?Mr. Jim Little  - as a part of your Medicaid benefit, you are eligible for care management and care coordination services at no cost or copay. I was unable to reach you by phone today but would be happy to help you with your health related needs. Please feel free to call me @ [phone] ?A member of the Managed Medicaid care management team will reach out to you again over the next 7 days.  ? ?Mickel Fuchs, BSW, MHA ?[place]  ?High Risk Managed Medicaid Team  ?[phone]  ?

## 2022-04-01 NOTE — Telephone Encounter (Signed)
Rescheduled tomorrow's appointment per 5/8 secure chat due to provider no available. [facility] is aware of changes.  ?

## 2022-04-01 NOTE — Progress Notes (Signed)
Jim Little ?OFFICE PROGRESS NOTE ? ?Jim Malay, MD ?[place] ?[place] [address] ? ?DIAGNOSIS: f/u of Jim Little ? ?Oncology History Overview Note  ?Cancer Staging ?Hepatocellular carcinoma ([place]) ?Staging form: Liver, AJCC 8th Edition ?- Clinical stage from 02/09/2021: Stage IIIA (cT3, cN0, cM0) - Signed by Truitt Merle, MD on 02/09/2021 ?Stage prefix: Initial diagnosis ? ?  ?Hepatocellular carcinoma ([place])  ?05/12/2020 Imaging  ? MRI Liver  ?IMPRESSION: ?1. Geographic lesion in the inferior medial aspect of the RIGHT ?hepatic lobe has some imaging features suggesting focal fatty ?infiltration. However, atypical enhancement pattern. Recommend ?follow-up contrast MRI in 3 to 6 months to re-evaluate. ?2. No additional lesion liver. ?3. Morphologic changes consistent with cirrhosis. ?  ?01/12/2021 Imaging  ? MRI Abdomen  ?IMPRESSION: ?1. Advanced cirrhotic changes involving the liver. There is a large ?infiltrating mass versus numerous confluent masses involving the ?right hepatic lobe and consistent with hepatocellular carcinoma. ?Associated thrombosed middle and portal veins. No definite left ?hepatic lobe lesions. LI-RADS 5. ?2. Borderline splenomegaly. ?3. No ascites or abdominal wall hernia. ?  ?  ?01/14/2021 Imaging  ? US Liver Doppler  ?IMPRESSION: ?Directed duplex of the hepatic vasculature confirms right portal ?vein and right hepatic vein occlusion, presumably from tumor ?thrombus given the right-sided liver tumor on MRI. The ultrasound ?correlate of the previously demonstrated right liver tumor is ?heterogeneous tissue with ill-defined margin. ?  ?In addition to the right-sided tumor, there are several small ?hyperechoic foci of approximately 1 cm, potentially additional HCC ?foci versus regenerative nodules. ?  ?Cirrhosis and splenomegaly ?  ?01/15/2021 Imaging  ? CT Chest  ?IMPRESSION: ?1. No evidence of metastatic disease in the chest. ?2. Cirrhotic morphology of the liver with ill-defined,  heterogeneous ?mass of the right lobe of the liver and occlusion of the right ?portal vein, in keeping with known hepatocellular carcinoma, better ?demonstrated by recent MR. ?  ?02/09/2021 Initial Diagnosis  ? Hepatocellular carcinoma ([place]) ? ?  ?02/09/2021 Cancer Staging  ? Staging form: Liver, AJCC 8th Edition ?- Clinical stage from 02/09/2021: Stage IIIA (cT3, cN0, cM0) - Signed by Truitt Merle, MD on 02/09/2021 ?Stage prefix: Initial diagnosis ? ?  ?05/29/2021 Imaging  ? MRI Abdomen ? ?IMPRESSION: ?1. Interval decrease in size of the infiltrating mass in the right ?hepatic lobe showing heterogeneous arterial phase hyperenhancement. Persistent arterial phase enhancement can be seen early post y 5 embolization. No substantial restricted diffusion in the lesion on today's study. ?2. Tiny focus of arterial phase hyperenhancement in the dome of the liver near the junction of segment IV and VIII. Unclear whether this was present previously given the marked motion degradation on the earlier study. Close attention on follow-up recommended. ?3. 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 Subtraction postcontrast imaging suggests that there may be some enhancement in the thrombus suggesting tumor thrombus or a combination of tumor and bland thrombus. ?4. Interval increase in size of the hepatoduodenal ligament lymph ?node measuring 2.0 x 1.7 cm. Likely metastatic disease,  continued close attention on follow-up recommended. ?5. Markedly distended urinary bladder. ?  ?06/29/2021 -  Chemotherapy  ? Patient is on Treatment Plan : LUNG Atezolizumab + Bevacizumab q21d Maintenance  ? ?  ?  ?11/28/2021 Imaging  ? EXAM: ?CT ABDOMEN AND PELVIS WITH CONTRAST ? ?IMPRESSION: ?1. Cirrhotic morphology of the liver with  redemonstrated post ?procedural findings of right hepatic lobe embolization resulting  in ?substantial atrophy of the right lobe of the liver. ?2. Generally unchanged appearance of a hypoenhancing mass in the peripheral right lobe following embolization, without convincing ?evidence of residual or recurrent contrast enhancement. ?3. No new suspicious contrast enhancement in the liver. ?4. No significant change in an enlarged hepatoduodenal or portacaval lymph node measuring 3.5 x 2.5 cm, likely a nodal metastasis. ?5. Additional smaller, nonspecific porta hepatis and left ?retroperitoneal lymph nodes. Attention on follow-up. ?6. Prostatomegaly with thickening of the urinary bladder wall and ?hyperenhancement of the mucosa. Correlate for urinalysis evidence of infectious or inflammatory cystitis ?  ?Aortic Atherosclerosis (ICD10-I70.0). ?  ?03/06/2022 Imaging  ? EXAM: ?CT ABDOMEN AND PELVIS WITH CONTRAST ? ?IMPRESSION: ?1. Cirrhotic morphology of the liver. Unchanged appearance of a ?hypoenhancing mass of the posterior right lobe of the liver without ?arterial hyperenhancement following radioembolization. The right ?portal vein remains occluded. ?2. No new arterial hyperenhancement in the liver. ?3. Significant interval enlargement of a bulky porta hepatis node, ?measuring 5.4 x 3.8 cm, previously 3.5 x 2.5 cm, consistent with ?worsened nodal metastatic disease. Unchanged smaller porta hepatis and left retroperitoneal lymph nodes, which remain nonspecific although suspicious for metastases. ?4. Thickening and hyperenhancement of the urinary bladder, again ?suggestive of nonspecific infectious or inflammatory cystitis, ?although possibly related to chronic outlet obstruction. Correlate ?with urinalysis if clinically referable signs and symptoms are ?present. ?  ? ? ?CURRENT THERAPY: -lenvatinib 8 mg daily for first 2 weeks, if tolerates well, will increase to full dose at 12 mg daily. First dose 03/13/22. Status post 3.5 weeks of treatment.  ? ?INTERVAL HISTORY: ?Jim Little 76 y.o. male returns to the  clinic today for a follow-up visit. He presents alone and the Ipad interpretor service was used. Patient last saw Dr. Burr Medico on 03/08/2022.  The patient had a restaging scan at that time which showed some evidence of disease progression.  Dr. Burr Medico recommended changing the patient's treatment to lenvatinib.  His first dose of treatment was on 03/13/22 per chart review. He completed the initial two weeks of 8 mg daily well without any appreciable adverse side effects. He increased his dose to full dose 12 mg daily. He also is tolerating this well without any concerning complaints.  Dr. Burr Medico also discussed the patient's case at the GI cancer conference to see if he is a candidate for radiation to the enlarging lymph node. I will follow up to see what was discussed. Otherwise the patient denies any new concerning complaints today.  Denies any recent fever, chills, night sweats, unexplained weight loss. He thinks he gained a few pounds. Denies any nausea, vomiting, diarrhea, or constipation.  Denies any abdominal pain.  Denies any jaundice or dark urine. He notes he is bladder incontinent. He denied any dysuria, malodorous, or cloudy urine. He did have an episode of incontinence in the clinic today. Denies any rashes or skin changes.  The patient is here today for evaluation and repeat blood work. ? ?MEDICAL HISTORY: ?Past Medical History:  ?Diagnosis Date  ? BPH (benign prostatic hyperplasia)   ? Cirrhosis ([place])   ? Hypertension   ? Splenomegaly   ? ? ?ALLERGIES:  is allergic to pork-derived products. ? ?MEDICATIONS:  ?Current Outpatient Medications  ?Medication Sig Dispense Refill  ? acetaminophen (TYLENOL) 500 MG tablet Take 500 mg by mouth 2 (two) times daily as needed for mild pain.    ? amLODipine (NORVASC) 10 MG tablet Take  1 tablet (10 mg total) by mouth daily. 90 tablet 3  ? lactulose (KRISTALOSE) 10 g packet TAKE 1 PACKET (10 G TOTAL) BY MOUTH AS NEEDED (CONSTIPATION). DISSOLVE IN EIGHT OUNCES OF WATER 30 each 1  ?  lenvatinib 12 mg daily dose (LENVIMA, 12 MG DAILY DOSE,) 3 x 4 MG capsule Take 12 mg by mouth daily. Take as instructed by MD 90 capsule 0  ? levothyroxine (SYNTHROID) 88 MCG tablet Take 1 tablet (88 mcg tot

## 2022-04-02 ENCOUNTER — Inpatient Hospital Stay: Payer: Medicaid Other

## 2022-04-02 ENCOUNTER — Other Ambulatory Visit: Payer: Self-pay

## 2022-04-02 ENCOUNTER — Inpatient Hospital Stay: Payer: Medicaid Other | Attending: Hematology

## 2022-04-02 ENCOUNTER — Inpatient Hospital Stay: Payer: Medicaid Other | Admitting: Nurse Practitioner

## 2022-04-02 ENCOUNTER — Inpatient Hospital Stay (HOSPITAL_BASED_OUTPATIENT_CLINIC_OR_DEPARTMENT_OTHER): Payer: Medicaid Other | Admitting: Physician Assistant

## 2022-04-02 ENCOUNTER — Other Ambulatory Visit (HOSPITAL_COMMUNITY): Payer: Self-pay

## 2022-04-02 ENCOUNTER — Telehealth: Payer: Self-pay

## 2022-04-02 DIAGNOSIS — I10 Essential (primary) hypertension: Secondary | ICD-10-CM | POA: Insufficient documentation

## 2022-04-02 DIAGNOSIS — N5089 Other specified disorders of the male genital organs: Secondary | ICD-10-CM | POA: Insufficient documentation

## 2022-04-02 DIAGNOSIS — K5900 Constipation, unspecified: Secondary | ICD-10-CM | POA: Insufficient documentation

## 2022-04-02 DIAGNOSIS — C220 Liver cell carcinoma: Secondary | ICD-10-CM | POA: Diagnosis not present

## 2022-04-02 DIAGNOSIS — N390 Urinary tract infection, site not specified: Secondary | ICD-10-CM | POA: Insufficient documentation

## 2022-04-02 DIAGNOSIS — K7460 Unspecified cirrhosis of liver: Secondary | ICD-10-CM | POA: Insufficient documentation

## 2022-04-02 DIAGNOSIS — I81 Portal vein thrombosis: Secondary | ICD-10-CM | POA: Insufficient documentation

## 2022-04-02 DIAGNOSIS — C772 Secondary and unspecified malignant neoplasm of intra-abdominal lymph nodes: Secondary | ICD-10-CM | POA: Diagnosis not present

## 2022-04-02 DIAGNOSIS — Z79899 Other long term (current) drug therapy: Secondary | ICD-10-CM | POA: Diagnosis not present

## 2022-04-02 LAB — CMP (CANCER CENTER ONLY)
ALT: 39 U/L (ref 0–44)
AST: 67 U/L — ABNORMAL HIGH (ref 15–41)
Albumin: 3.5 g/dL (ref 3.5–5.0)
Alkaline Phosphatase: 145 U/L — ABNORMAL HIGH (ref 38–126)
Anion gap: 5 (ref 5–15)
BUN: 11 mg/dL (ref 8–23)
CO2: 27 mmol/L (ref 22–32)
Calcium: 9.4 mg/dL (ref 8.9–10.3)
Chloride: 105 mmol/L (ref 98–111)
Creatinine: 0.88 mg/dL (ref 0.61–1.24)
GFR, Estimated: >60 mL/min (ref >60)
Glucose, Bld: 73 mg/dL (ref 70–99)
Potassium: 4.2 mmol/L (ref 3.5–5.1)
Sodium: 137 mmol/L (ref 135–145)
Total Bilirubin: 0.7 mg/dL (ref 0.3–1.2)
Total Protein: 8.9 g/dL — ABNORMAL HIGH (ref 6.5–8.1)

## 2022-04-02 LAB — CBC WITH DIFFERENTIAL (CANCER CENTER ONLY)
Abs Immature Granulocytes: 0.01 10*3/uL (ref 0.00–0.07)
Basophils Absolute: 0 10*3/uL (ref 0.0–0.1)
Basophils Relative: 0 %
Eosinophils Absolute: 0.1 10*3/uL (ref 0.0–0.5)
Eosinophils Relative: 3 %
HCT: 38.5 % — ABNORMAL LOW (ref 39.0–52.0)
Hemoglobin: 13 g/dL (ref 13.0–17.0)
Immature Granulocytes: 0 %
Lymphocytes Relative: 29 %
Lymphs Abs: 1.4 10*3/uL (ref 0.7–4.0)
MCH: 33.7 pg (ref 26.0–34.0)
MCHC: 33.8 g/dL (ref 30.0–36.0)
MCV: 99.7 fL (ref 80.0–100.0)
Monocytes Absolute: 0.8 10*3/uL (ref 0.1–1.0)
Monocytes Relative: 18 %
Neutro Abs: 2.3 10*3/uL (ref 1.7–7.7)
Neutrophils Relative %: 50 %
Platelet Count: 115 10*3/uL — ABNORMAL LOW (ref 150–400)
RBC: 3.86 MIL/uL — ABNORMAL LOW (ref 4.22–5.81)
RDW: 14.7 % (ref 11.5–15.5)
WBC Count: 4.7 10*3/uL (ref 4.0–10.5)
nRBC: 0 % (ref 0.0–0.2)

## 2022-04-02 MED ORDER — LENVIMA (12 MG DAILY DOSE) 3 X 4 MG PO CPPK
12.0000 mg | ORAL_CAPSULE | Freq: Every day | ORAL | 0 refills | Status: DC
  Filled 2022-04-02: qty 90, 30d supply, fill #0

## 2022-04-02 NOTE — Telephone Encounter (Signed)
Patient called with appointment  reminder, transportation confirmed. ? ?Honor Loh RN BSN PCCN  ?[facility] Nurse ?[phone]-cell ?[phone]-office ? ?

## 2022-04-03 ENCOUNTER — Other Ambulatory Visit: Payer: Self-pay | Admitting: Physician Assistant

## 2022-04-03 DIAGNOSIS — R599 Enlarged lymph nodes, unspecified: Secondary | ICD-10-CM

## 2022-04-03 DIAGNOSIS — C220 Liver cell carcinoma: Secondary | ICD-10-CM

## 2022-04-03 NOTE — Progress Notes (Signed)
I spoke to Dr. Burr Medico. At GI cancer conference, it was recommended that the patient undergo radiation to the enlarging porta hepatis node on most recent scan. I have placed the referral. I also spoke with the patient's congregational nurse. She is aware of referral and will pass this on to the patient. She knows to expect a phone call with the appointment as she is the point of contact.  ?

## 2022-04-04 ENCOUNTER — Other Ambulatory Visit (HOSPITAL_COMMUNITY): Payer: Self-pay

## 2022-04-08 ENCOUNTER — Telehealth: Payer: Self-pay

## 2022-04-08 NOTE — Telephone Encounter (Signed)
Contacted patient with appointment reminder with urology for today. ? ?Honor Loh RN BSN PCCN  ?[place] Nurse ?[phone]-cell ?[phone]-office ? ?

## 2022-04-08 NOTE — Progress Notes (Signed)
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 ?Jim Little presented for restaging scan at that time which showed some evidence of disease progression. ? ?CT AP 03/06/2022: Unchanged appearance of a ?hypoenhancing mass of the posterior right lobe of the liver without ?arterial hyperenhancement following radioembolization.  Significant interval enlargement of a bulky porta hepatis node, measuring 5.4 x 3.8 cm, previously 3.5 x 2.5 cm. ? ? ?Past/Anticipated interventions by surgeon, if any:  ? ? ?Past/Anticipated interventions by medical oncology, if any:  ?Cassandra Heilingoetter P.A. 04/02/2022: ? -Dr. Burr Medico recommended changing the patient's treatment to lenvatinib.  His first dose of treatment was on 03/13/22 per chart review. He completed the initial two weeks of 8 mg daily well without any appreciable adverse side effects. He increased his dose to full dose 12 mg daily. He also is tolerating this well without any concerning complaints.   ?-Dr. Burr Medico also discussed the patient's case at the GI cancer conference to see if he is a candidate for radiation to the enlarging lymph node. ? ? ? ?Weight changes, if any: Weight is stable. ? ?Bowel/Bladder complaints, if any: No changes with bowels or bladder. ? ?Nausea / Vomiting, if any: No ? ?Pain issues, if any:  No ? ?Appetite: He is eating well. ? ?SAFETY ISSUES: ?Prior radiation? Y90 radio-embolization on 03/08/2021 ?Pacemaker/ICD? No ?Possible current pregnancy? N/a ?Is the patient on methotrexate? No ? ?Current Complaints/Details: ?  ?

## 2022-04-09 ENCOUNTER — Ambulatory Visit
Admission: RE | Admit: 2022-04-09 | Discharge: 2022-04-09 | Disposition: A | Discharge status: Home / Self Care | Payer: Medicaid Other | Source: Ambulatory Visit | Attending: Radiation Oncology | Admitting: Radiation Oncology

## 2022-04-09 ENCOUNTER — Other Ambulatory Visit: Payer: Self-pay

## 2022-04-09 ENCOUNTER — Telehealth: Payer: Self-pay

## 2022-04-09 ENCOUNTER — Encounter: Payer: Self-pay | Admitting: Radiation Oncology

## 2022-04-09 VITALS — BP 155/60 | HR 89 | Temp 98.4°F | Resp 17 | Ht 66.0 in | Wt 151.2 lb

## 2022-04-09 DIAGNOSIS — K7460 Unspecified cirrhosis of liver: Secondary | ICD-10-CM | POA: Diagnosis not present

## 2022-04-09 DIAGNOSIS — C220 Liver cell carcinoma: Secondary | ICD-10-CM | POA: Insufficient documentation

## 2022-04-09 DIAGNOSIS — I10 Essential (primary) hypertension: Secondary | ICD-10-CM | POA: Insufficient documentation

## 2022-04-09 DIAGNOSIS — R161 Splenomegaly, not elsewhere classified: Secondary | ICD-10-CM | POA: Insufficient documentation

## 2022-04-09 DIAGNOSIS — N400 Enlarged prostate without lower urinary tract symptoms: Secondary | ICD-10-CM | POA: Insufficient documentation

## 2022-04-09 DIAGNOSIS — Z79899 Other long term (current) drug therapy: Secondary | ICD-10-CM | POA: Diagnosis not present

## 2022-04-09 NOTE — Progress Notes (Signed)
?Radiation Oncology         [phone] ?________________________________ ? ?Name: Jim Little        MRN: [id number]  ?Date of Service: 04/09/2022 DOB: Jul 18, 1946 ? ?[id number], Jim Simmonds, MD  Truitt Merle, MD    ? ?REFERRING PHYSICIAN: Truitt Merle, MD ? ? 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 ? ? ?HISTORY OF PRESENT ILLNESS: [name] is a 76 y.o. male seen at the request of Dr. Burr Medico for diagnosis of progressive hepatocellular carcinoma.  The patient was originally diagnosed in 2001 with a lesion in the inferior right hepatic lobe, unfortunately it progressed in surveillance, and in February 2022 was classified as radiographically consistent with hepatocellular carcinoma bilateral RADS criteria.  No evidence of metastatic disease was noted and it was staged as T3N0 disease.  He began Avastin and immunotherapy in August 2022, serial scans have shown decrease and subsequent stability in the right hepatic lobe mass, but development of porta hepatis adenopathy in July 2022.  His systemic therapy was changed his most recent scan on 03/06/2022 showed significant interval enlargement to 5.4 cm.  His case was discussed in GI oncology conference and he is seen to consider radiotherapy options to the porta hepatis adenopathy. ? ? ? ?PREVIOUS RADIATION THERAPY: No ? ? ?PAST MEDICAL HISTORY:  ?Past Medical History:  ?Diagnosis Date  ? BPH (benign prostatic hyperplasia)   ? Cirrhosis ([place])   ? Hypertension   ? Splenomegaly   ?   ? ? ?PAST SURGICAL HISTORY: ?Past Surgical History:  ?Procedure Laterality Date  ? BIOPSY  08/22/2020  ? Procedure: BIOPSY;  Surgeon: Ronnette Juniper, MD;  Location: [facility];  Service: Gastroenterology;;  ? COLONOSCOPY WITH PROPOFOL N/A 08/22/2020  ? Procedure: COLONOSCOPY WITH PROPOFOL;  Surgeon: Ronnette Juniper, MD;  Location: [facility];  Service: Gastroenterology;  Laterality: N/A;  ? ESOPHAGOGASTRODUODENOSCOPY (EGD) WITH PROPOFOL N/A 08/22/2020  ? Procedure:  ESOPHAGOGASTRODUODENOSCOPY (EGD) WITH PROPOFOL;  Surgeon: Ronnette Juniper, MD;  Location: [facility];  Service: Gastroenterology;  Laterality: N/A;  ? IR ANGIOGRAM SELECTIVE EACH ADDITIONAL VESSEL  03/01/2021  ? IR ANGIOGRAM SELECTIVE EACH ADDITIONAL VESSEL  03/08/2021  ? IR ANGIOGRAM VISCERAL SELECTIVE  03/01/2021  ? IR ANGIOGRAM VISCERAL SELECTIVE  03/01/2021  ? IR ANGIOGRAM VISCERAL SELECTIVE  03/08/2021  ? IR EMBO TUMOR ORGAN ISCHEMIA INFARCT INC GUIDE ROADMAPPING  03/08/2021  ? IR RADIOLOGIST EVAL & MGMT  02/06/2021  ? IR RADIOLOGIST EVAL & MGMT  05/31/2021  ? IR US GUIDE VASC ACCESS RIGHT  03/01/2021  ? IR US GUIDE VASC ACCESS RIGHT  03/08/2021  ? UPPER GASTROINTESTINAL ENDOSCOPY  03/2018  ? [place] --candidiasis plus esophageal varices grade 2.  Treated with fluconazole and status post esophageal banding.  ? US ECHOCARDIOGRAPHY  10/2016  ? per overseas records from [place] - LVEF 68%; mildly calcified aortic valve, normal function otherwise  ? ? ? ?FAMILY HISTORY: History reviewed. No pertinent family history. ? ? ?SOCIAL HISTORY:  reports that he has never smoked. He has never used smokeless tobacco. He reports that he does not currently use drugs. He reports that he does not drink alcohol. The patient is originally from [place]. He was a fisherman and a Industrial/product designer. He lives in [place] with his wife, 35 young children, and several other adult children. ? ? ?ALLERGIES: Pork-derived products ? ? ?MEDICATIONS:  ?Current Outpatient Medications  ?Medication Sig Dispense Refill  ? acetaminophen (TYLENOL) 500 MG tablet Take 500 mg by mouth 2 (  two) times daily as needed for mild pain.    ? amLODipine (NORVASC) 10 MG tablet Take 1 tablet (10 mg total) by mouth daily. 90 tablet 3  ? lactulose (KRISTALOSE) 10 g packet TAKE 1 PACKET (10 G TOTAL) BY MOUTH AS NEEDED (CONSTIPATION). DISSOLVE IN EIGHT OUNCES OF WATER 30 each 1  ? lenvatinib 12 mg daily dose (LENVIMA, 12 MG DAILY DOSE,) 3 x 4 MG capsule Take  12 mg by mouth daily. Take as instructed by MD 90 capsule 0  ? levothyroxine (SYNTHROID) 88 MCG tablet Take 1 tablet (88 mcg total) by mouth every morning. 30 minutes before food 60 tablet 1  ? nitrofurantoin, macrocrystal-monohydrate, (MACROBID) 100 MG capsule Take 1 capsule (100 mg total) by mouth 2 (two) times daily. 10 capsule 0  ? Olopatadine HCl (PAZEO) 0.7 % SOLN Use once daily in both eyes 2.5 mL 0  ? omeprazole (PRILOSEC) 40 MG capsule Take 1 capsule (40 mg total) by mouth daily. 90 capsule 3  ? tamsulosin (FLOMAX) 0.4 MG CAPS capsule Take 1 capsule (0.4 mg total) by mouth daily. Please pack and mail to home 90 capsule 3  ? traMADol (ULTRAM) 50 MG tablet Take 1 tablet (50 mg total) by mouth every 12 (twelve) hours as needed for severe pain. 15 tablet 0  ? ?No current facility-administered medications for this encounter.  ? ?Facility-Administered Medications Ordered in Other Encounters  ?Medication Dose Route Frequency Provider Last Rate Last Admin  ? sodium chloride flush (NS) 0.9 % injection 10 mL  10 mL Intracatheter PRN Truitt Merle, MD   10 mL at 09/19/21 1604  ? ? ? ?REVIEW OF SYSTEMS: On review of systems, the patient reports that he is doing pretty well and tolerating his current medical therapy with lenvatinib. He is not having any abdominal pain, nausea, vomiting, changes in bowel habits, jaundice or pruritis. No other complaints are verbalized.  ? ?  ? ?PHYSICAL EXAM:  ?Wt Readings from Last 3 Encounters:  ?04/09/22 151 lb 3.2 oz (68.6 kg)  ?03/08/22 151 lb (68.5 kg)  ?01/03/22 153 lb 4.8 oz (69.5 kg)  ? ?Temp Readings from Last 3 Encounters:  ?04/09/22 98.4 ?F (36.9 ?C)  ?04/02/22 97.7 ?F (36.5 ?C) (Tympanic)  ?03/08/22 98.8 ?F (37.1 ?C) (Oral)  ? ?BP Readings from Last 3 Encounters:  ?04/09/22 (!) 155/60  ?04/02/22 (!) 157/96  ?03/08/22 (!) 142/86  ? ?Pulse Readings from Last 3 Encounters:  ?04/09/22 89  ?04/02/22 84  ?03/08/22 76  ? ?Pain Assessment ?Pain Score: 0-No pain/10 ? ?In general this is  a well appearing African male in no acute distress.  He's alert and oriented x4 and appropriate throughout the examination. Cardiopulmonary assessment is negative for acute distress and he exhibits normal effort.  ? ? ? ?ECOG = 0 ? ?0 - Asymptomatic (Fully active, able to carry on all predisease activities without restriction) ? ?1 - Symptomatic but completely ambulatory (Restricted in physically strenuous activity but ambulatory and able to carry out work of a light or sedentary nature. For example, light housework, office work) ? ?2 - Symptomatic, <50% in bed during the day (Ambulatory and capable of all self care but unable to carry out any work activities. Up and about more than 50% of waking hours) ? ?3 - Symptomatic, >50% in bed, but not bedbound (Capable of only limited self-care, confined to bed or chair 50% or more of waking hours) ? ?4 - Bedbound (Completely disabled. Cannot carry on any self-care. Totally confined  to bed or chair) ? ?5 - Death ? ? Oken MM, Creech RH, Tormey DC, et al. [phone]). "Toxicity and response criteria of the [facility]". [place] Oncol. 5 (6): 649-55 ? ? ? ?LABORATORY DATA:  ?Lab Results  ?Component Value Date  ? WBC 4.7 04/02/2022  ? HGB 13.0 04/02/2022  ? HCT 38.5 (L) 04/02/2022  ? MCV 99.7 04/02/2022  ? PLT 115 (L) 04/02/2022  ? ?Lab Results  ?Component Value Date  ? NA 137 04/02/2022  ? K 4.2 04/02/2022  ? CL 105 04/02/2022  ? CO2 27 04/02/2022  ? ?Lab Results  ?Component Value Date  ? ALT 39 04/02/2022  ? AST 67 (H) 04/02/2022  ? GGT 150 (H) 03/06/2020  ? ALKPHOS 145 (H) 04/02/2022  ? BILITOT 0.7 04/02/2022  ? ?  ? ?RADIOGRAPHY: No results found. ?   ? ?IMPRESSION/PLAN: ?1. Progressive Stage IIIA, cT3N0M0, Hepatocellular Carcinoma of the Right liver with portohepatic nodal progression. Dr. Lisbeth Renshaw discusses the patient's course to date and reviews the rationale for stereotactic body radiotherapy (SBRT) to the abdominal adenopathy. We discussed the  risks, benefits, short, and long term effects of radiotherapy, as well as the curative intent, and the patient is interested in proceeding. Dr. Lisbeth Renshaw discusses the delivery and logistics of radiotherapy and anticipates a co

## 2022-04-09 NOTE — Telephone Encounter (Signed)
Contacted patient with appointment reminder today at 0930. I will schedule a ride with [employer]. ? ?Honor Loh RN BSN PCCN  ?[place] Nurse ?[phone]-cell ?[phone]-office ? ?

## 2022-04-10 ENCOUNTER — Other Ambulatory Visit (HOSPITAL_COMMUNITY): Payer: Self-pay

## 2022-04-11 ENCOUNTER — Other Ambulatory Visit: Payer: Self-pay

## 2022-04-11 NOTE — Patient Outreach (Signed)
Care Coordination  04/11/2022  [name] 06-18-1946 [id number]   Medicaid Managed Care   Unsuccessful Outreach Note  04/11/2022 Name: Jim Little MRN: [id number] DOB: 06-10-1946  Referred by: [name], MD Reason for referral : High Risk Managed Medicaid (MM social work unsuccessful telephone outreach)   Third unsuccessful telephone outreach was attempted today. The patient was referred to the case management team for assistance with care management and care coordination. The patient's primary care provider has been notified of our unsuccessful attempts to make or maintain contact with the patient. The care management team is pleased to engage with this patient at any time in the future should he/she be interested in assistance from the care management team.   Follow Up Plan: A HIPAA compliant phone message was left for the patient providing contact information and requesting a return call.   Mickel Fuchs, BSW, [place] Managed Medicaid Team  [phone]

## 2022-04-11 NOTE — Patient Instructions (Signed)
Visit Information  Mr. Janyce Llanos  - as a part of your Medicaid benefit, you are eligible for care management and care coordination services at no cost or copay. I was unable to reach you by phone today but would be happy to help you with your health related needs. Please feel free to call me @ [place], BSW, [place] Medicaid Team  [phone]

## 2022-04-19 NOTE — Progress Notes (Unsigned)
Jim Little OFFICE PROGRESS NOTE  [name], MD [address] [place] [address]  DIAGNOSIS: f/u of [facility]  Oncology History Overview Note  Cancer Staging Hepatocellular carcinoma [facility]) Staging form: Liver, AJCC 8th Edition - Clinical stage from 02/09/2021: Stage IIIA (cT3, cN0, cM0) - Signed by Truitt Merle, MD on 02/09/2021 Stage prefix: Initial diagnosis    Hepatocellular carcinoma ([place])  05/12/2020 Imaging   MRI Liver  IMPRESSION: 1. Geographic lesion in the inferior medial aspect of the RIGHT hepatic lobe has some imaging features suggesting focal fatty infiltration. However, atypical enhancement pattern. Recommend follow-up contrast MRI in 3 to 6 months to re-evaluate. 2. No additional lesion liver. 3. Morphologic changes consistent with cirrhosis.   01/12/2021 Imaging   MRI Abdomen  IMPRESSION: 1. Advanced cirrhotic changes involving the liver. There is a large infiltrating mass versus numerous confluent masses involving the right hepatic lobe and consistent with hepatocellular carcinoma. Associated thrombosed middle and portal veins. No definite left hepatic lobe lesions. LI-RADS 5. 2. Borderline splenomegaly. 3. No ascites or abdominal wall hernia.     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 The ultrasound correlate of the previously demonstrated right liver tumor is heterogeneous tissue with ill-defined margin.   In addition to the right-sided tumor, there are several small hyperechoic foci of approximately 1 cm, potentially additional Stanton foci versus regenerative nodules.   Cirrhosis and splenomegaly   01/15/2021 Imaging   CT Chest  IMPRESSION: 1. No evidence of metastatic disease in the chest. 2. Cirrhotic morphology of the liver with ill-defined,  heterogeneous mass of the right lobe of the liver and occlusion of the right portal vein, in keeping with known hepatocellular carcinoma, better demonstrated by recent MR.   02/09/2021 Initial Diagnosis   Hepatocellular carcinoma ([place])    02/09/2021 Cancer Staging   Staging form: Liver, AJCC 8th Edition - Clinical stage from 02/09/2021: Stage IIIA (cT3, cN0, cM0) - Signed by Truitt Merle, MD on 02/09/2021 Stage prefix: Initial diagnosis    05/29/2021 Imaging   MRI Abdomen  IMPRESSION: 1. Interval decrease in size of the infiltrating mass in the right hepatic lobe showing heterogeneous arterial phase hyperenhancement. Persistent arterial phase enhancement can be seen early post y 10 embolization. No substantial restricted diffusion in the lesion on today's study. 2. Tiny focus of arterial phase hyperenhancement in the dome of the liver near the junction of segment IV and VIII. Unclear whether this was present previously given the marked motion degradation on the earlier study. Close attention on follow-up recommended. 3. 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 Subtraction postcontrast imaging suggests that there may be some enhancement in the thrombus suggesting tumor thrombus or a combination of tumor and bland thrombus. 4. Interval increase in size of the hepatoduodenal ligament lymph node measuring 2.0 x 1.7 cm. Likely metastatic disease,  continued close attention on follow-up recommended. 5. Markedly distended urinary bladder.   06/29/2021 -  Chemotherapy   Patient is on Treatment Plan : LUNG Atezolizumab + Bevacizumab q21d Maintenance      11/28/2021 Imaging   EXAM: CT ABDOMEN AND PELVIS WITH CONTRAST  IMPRESSION: 1. Cirrhotic morphology of the liver with redemonstrated  post procedural findings of right hepatic lobe embolization resulting  in substantial atrophy of the right lobe of the liver. 2. Generally unchanged appearance of a hypoenhancing mass in the peripheral right lobe following embolization, without convincing evidence of residual or recurrent contrast enhancement. 3. No new suspicious contrast enhancement in the liver. 4. No significant change in an enlarged hepatoduodenal or portacaval lymph node measuring 3.5 x 2.5 cm, likely a nodal metastasis. 5. Additional smaller, nonspecific porta hepatis and left retroperitoneal lymph nodes. Attention on follow-up. 6. Prostatomegaly with thickening of the urinary bladder wall and hyperenhancement of the mucosa. Correlate for urinalysis evidence of infectious or inflammatory cystitis   Aortic Atherosclerosis (ICD10-I70.0).   03/06/2022 Imaging   EXAM: CT ABDOMEN AND PELVIS WITH CONTRAST  IMPRESSION: 1. Cirrhotic morphology of the liver. Unchanged appearance of a hypoenhancing mass of the posterior right lobe of the liver without arterial hyperenhancement following radioembolization. The right portal vein remains occluded. 2. No new arterial hyperenhancement in the liver. 3. Significant interval enlargement of a bulky porta hepatis node, measuring 5.4 x 3.8 cm, previously 3.5 x 2.5 cm, consistent with worsened nodal metastatic disease. Unchanged smaller porta hepatis and left retroperitoneal lymph nodes, which remain nonspecific although suspicious for metastases. 4. Thickening and hyperenhancement of the urinary bladder, again suggestive of nonspecific infectious or inflammatory cystitis, although possibly related to chronic outlet obstruction. Correlate with urinalysis if clinically referable signs and symptoms are present.      CURRENT THERAPY: -lenvatinib 8 mg daily for first 2 weeks, if tolerates well, will increase to full dose at 12 mg daily. First dose 03/13/22. Status post 5 weeks of treatment.   INTERVAL HISTORY: Jim Little 76 y.o. male returns to the  clinic for a follow up visit with his interpretor.  When the patient last saw Dr. Burr Medico on 03/08/2022, he had a restaging scan at that time which showed some evidence of disease progression.  Dr. Burr Medico recommended changing the patient's treatment to lenvatinib.  His first dose of treatment was on 03/13/22 per chart review.  He completed the initial two weeks of 8 mg daily well without any appreciable adverse side effects. He increased his dose to full dose 12 mg daily.  It is unclear if the patient is taking this medicine correctly as he told me he takes 2 tablets in the morning and 2 at night.  He has a Automotive engineer that helps him with his medications.  He also is tolerating this well without any concerning complaints.  Dr. Burr Medico also discussed the patient's case at the GI cancer conference to see if he is a candidate for radiation to the enlarging lymph node.  The patient had appointment with radiation oncology on 04/09/2022. The patient does not remember this appointment or if radiation was discussed.   Otherwise the patient denies any new concerning complaints today.  He thinks he was seen in the interval for a possible fever received an antibiotic.  The patient is not able to tell me who prescribed this or what it was for.  I do not see any other office visits in my EMR.  Denies any recent chills, night sweats, unexplained weight loss.  He reports a good appetite.  Denies any nausea, vomiting, diarrhea, or constipation.  Denies any abdominal pain.  Denies any jaundice or dark urine. He notes he is bladder incontinent.  Denies any rashes or skin changes.  The patient is here today for evaluation and repeat blood work.   MEDICAL HISTORY: Past  Medical History:  Diagnosis Date   BPH (benign prostatic hyperplasia)    Cirrhosis (HCC)    Hypertension    Splenomegaly     ALLERGIES:  is allergic to pork-derived products.  MEDICATIONS:  Current Outpatient Medications  Medication Sig Dispense Refill    acetaminophen (TYLENOL) 500 MG tablet Take 500 mg by mouth 2 (two) times daily as needed for mild pain.     amLODipine (NORVASC) 10 MG tablet Take 1 tablet (10 mg total) by mouth daily. 90 tablet 3   lactulose (KRISTALOSE) 10 g packet TAKE 1 PACKET (10 G TOTAL) BY MOUTH AS NEEDED (CONSTIPATION). DISSOLVE IN EIGHT OUNCES OF WATER 30 each 1   lenvatinib 12 mg daily dose (LENVIMA, 12 MG DAILY DOSE,) 3 x 4 MG capsule Take 12 mg by mouth daily. Take as instructed by MD 90 capsule 0   levothyroxine (SYNTHROID) 88 MCG tablet Take 1 tablet (88 mcg total) by mouth every morning. 30 minutes before food 60 tablet 1   nitrofurantoin, macrocrystal-monohydrate, (MACROBID) 100 MG capsule Take 1 capsule (100 mg total) by mouth 2 (two) times daily. 10 capsule 0   Olopatadine HCl (PAZEO) 0.7 % SOLN Use once daily in both eyes 2.5 mL 0   omeprazole (PRILOSEC) 40 MG capsule Take 1 capsule (40 mg total) by mouth daily. 90 capsule 3   tamsulosin (FLOMAX) 0.4 MG CAPS capsule Take 1 capsule (0.4 mg total) by mouth daily. Please pack and mail to home 90 capsule 3   traMADol (ULTRAM) 50 MG tablet Take 1 tablet (50 mg total) by mouth every 12 (twelve) hours as needed for severe pain. 15 tablet 0   No current facility-administered medications for this visit.   Facility-Administered Medications Ordered in Other Visits  Medication Dose Route Frequency Provider Last Rate Last Admin   sodium chloride flush (NS) 0.9 % injection 10 mL  10 mL Intracatheter PRN Truitt Merle, MD   10 mL at 09/19/21 1604    SURGICAL HISTORY:  Past Surgical History:  Procedure Laterality Date   BIOPSY  08/22/2020   Procedure: BIOPSY;  Surgeon: Ronnette Juniper, MD;  Location: [facility];  Service: Gastroenterology;;   COLONOSCOPY WITH PROPOFOL N/A 08/22/2020   Procedure: COLONOSCOPY WITH PROPOFOL;  Surgeon: Ronnette Juniper, MD;  Location: [facility];  Service: Gastroenterology;  Laterality: N/A;   ESOPHAGOGASTRODUODENOSCOPY (EGD) WITH PROPOFOL N/A  08/22/2020   Procedure: ESOPHAGOGASTRODUODENOSCOPY (EGD) WITH PROPOFOL;  Surgeon: Ronnette Juniper, MD;  Location: [facility];  Service: Gastroenterology;  Laterality: N/A;   IR ANGIOGRAM SELECTIVE EACH ADDITIONAL VESSEL  03/01/2021   IR ANGIOGRAM SELECTIVE EACH ADDITIONAL VESSEL  03/08/2021   IR ANGIOGRAM VISCERAL SELECTIVE  03/01/2021   IR ANGIOGRAM VISCERAL SELECTIVE  03/01/2021   IR ANGIOGRAM VISCERAL SELECTIVE  03/08/2021   IR EMBO TUMOR ORGAN ISCHEMIA INFARCT INC GUIDE ROADMAPPING  03/08/2021   IR RADIOLOGIST EVAL & MGMT  02/06/2021   IR RADIOLOGIST EVAL & MGMT  05/31/2021   IR US GUIDE VASC ACCESS RIGHT  03/01/2021   IR US GUIDE VASC ACCESS RIGHT  03/08/2021   UPPER GASTROINTESTINAL ENDOSCOPY  03/2018   [place] --candidiasis plus esophageal varices grade 2.  Treated with fluconazole and status post esophageal banding.   US ECHOCARDIOGRAPHY  10/2016   per overseas records from [place] - LVEF 68%; mildly calcified aortic valve, normal function otherwise    REVIEW OF SYSTEMS:   Constitutional: Negative for appetite change, chills, fatigue, fever and unexpected weight change.  HENT: Negative for mouth sores,  nosebleeds, sore throat and trouble swallowing.   Eyes: Negative for eye problems and icterus.  Respiratory: Negative for cough, hemoptysis, shortness of breath and wheezing.   Cardiovascular: Negative for chest pain and leg swelling.  Gastrointestinal: Negative for abdominal pain, constipation, diarrhea, nausea and vomiting.  Genitourinary: Positive for bladder incontinence. Negative for difficulty urinating, dysuria, frequency and hematuria.   Musculoskeletal: Negative for back pain, gait problem, neck pain and neck stiffness.  Skin: Negative for itching and rash.  Neurological: Negative for dizziness, extremity weakness, gait problem, headaches, light-headedness and seizures.  Hematological: Negative for adenopathy. Does not bruise/bleed easily.  Psychiatric/Behavioral: Negative for  confusion, depression and sleep disturbance. The patient is not nervous/anxious.     PHYSICAL EXAMINATION:  Blood pressure 135/76, pulse 86, temperature 97.6 F (36.4 C), temperature source Tympanic, weight 152 lb 11.2 oz (69.3 kg), SpO2 97 %.  ECOG PERFORMANCE STATUS: 1-2  Physical Exam  Constitutional: Oriented to person, place, and time and well-developed, well-nourished, and in no distress. HENT:  Head: Normocephalic and atraumatic.  Mouth/Throat: Oropharynx is clear and moist. No oropharyngeal exudate.  Eyes: Conjunctivae are normal. Right eye exhibits no discharge. Left eye exhibits no discharge. No scleral icterus.  Neck: Normal range of motion. Neck supple.  Cardiovascular: Normal rate, regular rhythm, normal heart sounds and intact distal pulses.   Pulmonary/Chest: Effort normal and breath sounds normal. No respiratory distress. No wheezes. No rales.  Abdominal: Soft. Bowel sounds are normal. Exhibits no distension and no mass. There is no tenderness.  Musculoskeletal: Normal range of motion. Exhibits no edema.  Lymphadenopathy:    No cervical adenopathy.  Neurological: Alert and oriented to person, place, and time. Exhibits normal muscle tone. Examined in the wheelchair.  Skin: Skin is warm and dry. No rash noted. Not diaphoretic. No erythema. No pallor.  Psychiatric: Mood, memory and judgment normal.  Vitals reviewed.  LABORATORY DATA: Lab Results  Component Value Date   WBC 5.1 04/24/2022   HGB 13.6 04/24/2022   HCT 39.6 04/24/2022   MCV 100.3 (H) 04/24/2022   PLT 135 (L) 04/24/2022      Chemistry      Component Value Date/Time   NA 137 04/24/2022 1415   NA 142 12/04/2020 1137   K 4.3 04/24/2022 1415   CL 106 04/24/2022 1415   CO2 24 04/24/2022 1415   BUN 17 04/24/2022 1415   BUN 6 (L) 12/04/2020 1137   CREATININE 0.71 04/24/2022 1415      Component Value Date/Time   CALCIUM 9.1 04/24/2022 1415   ALKPHOS 144 (H) 04/24/2022 1415   AST 57 (H) 04/24/2022  1415   ALT 28 04/24/2022 1415   BILITOT 0.7 04/24/2022 1415       RADIOGRAPHIC STUDIES:  No results found.   ASSESSMENT/PLAN:  Jim Little is a 76 y.o. male with    1. Hepatocellular Carcinoma, unresectable, stage IIIA, with node metastasis  -Based on MRI liver 01/12/21 with Advanced cirrhotic changes involving the liver and Large infiltrating mass versus numerous confluent masses involving the right hepatic lobe. This is consistent with hepatocellular carcinoma. Associated thrombosed middle and portal veins. No definite left hepatic lobe lesions. AFP (01/14/21) 11,904.0. -His 01/15/21 CT of the chest did not show metastatic disease to the lungs. Staging workup completed. -He has unclear etiology of cirrhosis, which is followed by Dr. Therisa Doyne. Hep B surface antigen non reactive (05/15/20), ANA negative, Hep C ab non reactive. No alcohol abuse. -He is s/p Y 90 radioembolization on 03/08/21. His AFP  has dropped significantly after Y90 -MRI scan from 05/29/21 showed good response in liver, but he developed hepatoduodenal ligament lymph node measuring 2.0 x 1.7 cm, likely metastatic disease   -given his extrahepatic metastasis, Dr. Burr Medico recommend systemic treatment Tecentriq and Avastin. Pt has not been compliant with oral medicine, oral TKI will be challenge for him. He had a EGD in 07/2020, showed no significant varices, okay to proceed Avastin -He started Tecentriq and Avastin on 06/29/21. He has tolerated relatively well, with constipation. -CT AP on 11/28/21 showed: no convincing evidence of residual or recurrent enhancement; no new enhancement in liver; similar enlarged portacaval lymph node. -His AFP has been rising lately, concerning for cancer progression. Chest CT 12/28/21 was negative. CT AP on 03/06/22 showed: unchanged right liver mass; significant enlargement of porta hepatis node; unchanged smaller RP nodes. -Dr. Burr Medico discussed changing treatment with him at his last appointment. She recommend  changing to lenvatinib.   -Given his well-controlled cancer in the liver, and only progressed in portal lymph node,Dr. Burr Medico discussed his case in GI conference to see if he is a candidate for radiation to the node.  -The patient saw a radiation on 04/09/2022.  The patient does not recall this.  I will reach out to radiation oncology and see if he is supposed to be undergoing radiation at this time.  I do not see any appointment scheduled. -The patient started lenvatinib on ~03/13/22. He has tolearted the 8 mg dose.  He should have been taking 12 mg p.o. daily.  Unclear if the patient is taking this correctly.  We just discussed with the patient's congressional nurse who also mentions that the patient has been taking 4 tablets daily which would equate to 16 mg which is not the correct dose.  We will reach out to clarify that the patient's dose should be 12 mg p.o. daily which is 3 tablets. -Per Lacie NP, repeat imaging will be arranged after 2-4 months of treatment. Imaging will likely be arranged at next appointment. -Labs were reviewed. Recommend that he continue with treatment at the same dose.  -I will arrange for labs and follow up with Dr. Burr Medico in 4 weeks.  -Reiterated the dosing with the patient and his interpreter today.   2. Symptom Management: Constipation, Central chest pain -constipation improved, he has lactulose.  -he reports a constant chest pain, rated about 5/10. Chest CT on 12/28/21 showed no acute findings or metastatic disease. -his BP has been more elevated in the clinic lately. Dr. Burr Medico prescribed amlodipine for him on 01/03/22. He previously told Dr. Ernestina Penna previous note indicates that he takes it every day except on days he comes here.   3. UTI, h/o of urinary retention -UA on 01/03/22 was positive. He was treated with a 5 day course of Macrobid. -he had foley catheter placed on 3/13, and it was removed 02/14/22. -LUTS improved. He is followed by [facility]. -He denied dysuria,  cloudy, or malodorous urine today.    4. Scrotal swelling -developed following Y90 treatment -his scrotal US from 11/02/21, showed left epididymis enlargement, may be due to epididymitis, and extensive microlithiasis -f/u with urology    5. Social Support, Language barrier -From the [place], was at a refugee camp in [place] -Has several family members living in single house. He is the primary caretaker for his 42 year old granddaughter with cerebral palsy. Neither he nor his family speak [place]  -He speaks Swahili. He has [employer] Scientist, research (physical sciences), [name]  [name], who helps him. Her contact is [phone]   6. Portal vein thrombosis, Cirrhosis  -Seen with direct admission from 01/13/21-01/16/21 -Patient received heparin drip inpatient. Dr. Marin Olp did not recommend anticoagulation since the thrombus is tumor thrombus. -His liver cirrhosis is followed by Dr. Therisa Doyne in GI. Prior work up with EGD/Colonoscopy in 07/2020 which showed no varices      PLAN: -Continue with full dose at 12 mg daily.  We will reach out to his congressional nurse who helps him with his medications to ensure he is taking the correct dose.  He is tolerating well without any appreciable adverse side effects.  -lab and f/u in 4 weeks -We will reach out to radiation oncology to see if he is supposed to be undergoing radiation to the enlarging lymph nodes.  No orders of the defined types were placed in this encounter.    The total time spent in the appointment was 20-29 minutes.   Jim Caba L Yetta Marceaux, PA-C 04/24/22

## 2022-04-24 ENCOUNTER — Inpatient Hospital Stay (HOSPITAL_BASED_OUTPATIENT_CLINIC_OR_DEPARTMENT_OTHER): Payer: Medicaid Other | Admitting: Physician Assistant

## 2022-04-24 ENCOUNTER — Inpatient Hospital Stay: Payer: Medicaid Other

## 2022-04-24 ENCOUNTER — Other Ambulatory Visit: Payer: Self-pay

## 2022-04-24 DIAGNOSIS — C220 Liver cell carcinoma: Secondary | ICD-10-CM

## 2022-04-24 LAB — CBC WITH DIFFERENTIAL (CANCER CENTER ONLY)
Abs Immature Granulocytes: 0.01 10*3/uL (ref 0.00–0.07)
Basophils Absolute: 0 10*3/uL (ref 0.0–0.1)
Basophils Relative: 0 %
Eosinophils Absolute: 0.2 10*3/uL (ref 0.0–0.5)
Eosinophils Relative: 4 %
HCT: 39.6 % (ref 39.0–52.0)
Hemoglobin: 13.6 g/dL (ref 13.0–17.0)
Immature Granulocytes: 0 %
Lymphocytes Relative: 23 %
Lymphs Abs: 1.2 10*3/uL (ref 0.7–4.0)
MCH: 34.4 pg — ABNORMAL HIGH (ref 26.0–34.0)
MCHC: 34.3 g/dL (ref 30.0–36.0)
MCV: 100.3 fL — ABNORMAL HIGH (ref 80.0–100.0)
Monocytes Absolute: 0.6 10*3/uL (ref 0.1–1.0)
Monocytes Relative: 12 %
Neutro Abs: 3.1 10*3/uL (ref 1.7–7.7)
Neutrophils Relative %: 61 %
Platelet Count: 135 10*3/uL — ABNORMAL LOW (ref 150–400)
RBC: 3.95 MIL/uL — ABNORMAL LOW (ref 4.22–5.81)
RDW: 15 % (ref 11.5–15.5)
WBC Count: 5.1 10*3/uL (ref 4.0–10.5)
nRBC: 0 % (ref 0.0–0.2)

## 2022-04-24 LAB — CMP (CANCER CENTER ONLY)
ALT: 28 U/L (ref 0–44)
AST: 57 U/L — ABNORMAL HIGH (ref 15–41)
Albumin: 3.4 g/dL — ABNORMAL LOW (ref 3.5–5.0)
Alkaline Phosphatase: 144 U/L — ABNORMAL HIGH (ref 38–126)
Anion gap: 7 (ref 5–15)
BUN: 17 mg/dL (ref 8–23)
CO2: 24 mmol/L (ref 22–32)
Calcium: 9.1 mg/dL (ref 8.9–10.3)
Chloride: 106 mmol/L (ref 98–111)
Creatinine: 0.71 mg/dL (ref 0.61–1.24)
GFR, Estimated: >60 mL/min (ref >60)
Glucose, Bld: 155 mg/dL — ABNORMAL HIGH (ref 70–99)
Potassium: 4.3 mmol/L (ref 3.5–5.1)
Sodium: 137 mmol/L (ref 135–145)
Total Bilirubin: 0.7 mg/dL (ref 0.3–1.2)
Total Protein: 7.4 g/dL (ref 6.5–8.1)

## 2022-04-24 LAB — TSH: TSH: 4.569 u[IU]/mL — ABNORMAL HIGH (ref 0.350–4.500)

## 2022-04-25 ENCOUNTER — Other Ambulatory Visit (HOSPITAL_COMMUNITY): Payer: Self-pay

## 2022-04-25 LAB — AFP TUMOR MARKER: AFP, Serum, Tumor Marker: 10177 ng/mL — ABNORMAL HIGH (ref 0.0–8.4)

## 2022-04-30 ENCOUNTER — Other Ambulatory Visit (HOSPITAL_COMMUNITY): Payer: Self-pay

## 2022-05-05 ENCOUNTER — Telehealth: Payer: Self-pay

## 2022-05-05 NOTE — Telephone Encounter (Signed)
Patient called with appointment reminder. Transportation assistance provided.  [name] Kewanda Poland RN BSN [place] Nurse [phone]

## 2022-05-06 ENCOUNTER — Ambulatory Visit
Admission: RE | Admit: 2022-05-06 | Discharge: 2022-05-06 | Disposition: A | Discharge status: Home / Self Care | Payer: Medicaid Other | Source: Ambulatory Visit | Attending: Radiation Oncology | Admitting: Radiation Oncology

## 2022-05-06 ENCOUNTER — Telehealth: Payer: Self-pay

## 2022-05-06 ENCOUNTER — Other Ambulatory Visit: Payer: Self-pay

## 2022-05-06 ENCOUNTER — Ambulatory Visit: Admission: RE | Admit: 2022-05-06 | Payer: Medicaid Other | Source: Ambulatory Visit | Admitting: Radiation Oncology

## 2022-05-06 VITALS — BP 158/81 | HR 71 | Temp 98.2°F | Resp 18

## 2022-05-06 DIAGNOSIS — Z87440 Personal history of urinary (tract) infections: Secondary | ICD-10-CM | POA: Insufficient documentation

## 2022-05-06 DIAGNOSIS — C778 Secondary and unspecified malignant neoplasm of lymph nodes of multiple regions: Secondary | ICD-10-CM | POA: Insufficient documentation

## 2022-05-06 DIAGNOSIS — K7460 Unspecified cirrhosis of liver: Secondary | ICD-10-CM | POA: Diagnosis not present

## 2022-05-06 DIAGNOSIS — C220 Liver cell carcinoma: Secondary | ICD-10-CM | POA: Insufficient documentation

## 2022-05-06 DIAGNOSIS — Z510 Encounter for antineoplastic radiation therapy: Secondary | ICD-10-CM | POA: Diagnosis not present

## 2022-05-06 DIAGNOSIS — K5900 Constipation, unspecified: Secondary | ICD-10-CM | POA: Insufficient documentation

## 2022-05-06 DIAGNOSIS — I81 Portal vein thrombosis: Secondary | ICD-10-CM | POA: Diagnosis not present

## 2022-05-06 DIAGNOSIS — I10 Essential (primary) hypertension: Secondary | ICD-10-CM | POA: Insufficient documentation

## 2022-05-06 MED ORDER — SODIUM CHLORIDE 0.9% FLUSH
10.0000 mL | Freq: Once | INTRAVENOUS | Status: AC
  Administered 2022-05-06: 10 mL via INTRAVENOUS

## 2022-05-06 NOTE — Telephone Encounter (Signed)
Transportation assistance scheduled to go back home.I have also scheduled daily transportation for radiation starting June 22nd.  [name] Oviya Ammar RN BSN [place] Nurse [phone]

## 2022-05-06 NOTE — Progress Notes (Signed)
Has armband been applied?  Yes  Does patient have an allergy to IV contrast dye?: No   Has patient ever received premedication for IV contrast dye?: n/a  Does patient take metformin?: No  If patient does take metformin when was the last dose: n/a  Date of lab work: 04/24/2022 BUN: 17 CR: 0.71 eGfr: >60  IV site: RAC  Has IV site been added to flowsheet?  Yes  BP (!) 158/81 (BP Location: Right Arm)   Pulse 71   Temp 98.2 F (36.8 C)   Resp 18   SpO2 99%

## 2022-05-07 ENCOUNTER — Other Ambulatory Visit (HOSPITAL_COMMUNITY): Payer: Self-pay

## 2022-05-07 ENCOUNTER — Other Ambulatory Visit: Payer: Self-pay | Admitting: Physician Assistant

## 2022-05-07 ENCOUNTER — Telehealth: Payer: Self-pay

## 2022-05-07 DIAGNOSIS — C220 Liver cell carcinoma: Secondary | ICD-10-CM

## 2022-05-07 MED ORDER — LENVIMA (12 MG DAILY DOSE) 3 X 4 MG PO CPPK
12.0000 mg | ORAL_CAPSULE | Freq: Every day | ORAL | 0 refills | Status: DC
  Filled 2022-05-07: qty 90, 30d supply, fill #0

## 2022-05-07 NOTE — Telephone Encounter (Signed)
Called pharmacy for medication refill. Contacted PCP for omeprazole refill. Other medications will be delivered this afternoon.   [name] Yehudis Monceaux RN BSN [name] Nurse [phone]

## 2022-05-09 ENCOUNTER — Other Ambulatory Visit (HOSPITAL_COMMUNITY): Payer: Self-pay

## 2022-05-12 ENCOUNTER — Encounter (HOSPITAL_COMMUNITY): Payer: Self-pay | Admitting: Internal Medicine

## 2022-05-12 ENCOUNTER — Telehealth: Payer: Self-pay

## 2022-05-12 ENCOUNTER — Other Ambulatory Visit: Payer: Self-pay

## 2022-05-12 ENCOUNTER — Emergency Department (HOSPITAL_COMMUNITY): Payer: Medicaid Other

## 2022-05-12 ENCOUNTER — Inpatient Hospital Stay (HOSPITAL_COMMUNITY)
Admission: EM | Admit: 2022-05-12 | Discharge: 2022-05-16 | DRG: 689 | Disposition: A | Discharge status: Home Health | Payer: Medicaid Other | Attending: Internal Medicine | Admitting: Internal Medicine

## 2022-05-12 DIAGNOSIS — K7460 Unspecified cirrhosis of liver: Secondary | ICD-10-CM | POA: Diagnosis present

## 2022-05-12 DIAGNOSIS — H1089 Other conjunctivitis: Secondary | ICD-10-CM | POA: Diagnosis present

## 2022-05-12 DIAGNOSIS — R338 Other retention of urine: Secondary | ICD-10-CM | POA: Diagnosis present

## 2022-05-12 DIAGNOSIS — N400 Enlarged prostate without lower urinary tract symptoms: Secondary | ICD-10-CM | POA: Diagnosis present

## 2022-05-12 DIAGNOSIS — Z79890 Hormone replacement therapy (postmenopausal): Secondary | ICD-10-CM

## 2022-05-12 DIAGNOSIS — H109 Unspecified conjunctivitis: Secondary | ICD-10-CM | POA: Diagnosis present

## 2022-05-12 DIAGNOSIS — E032 Hypothyroidism due to medicaments and other exogenous substances: Secondary | ICD-10-CM | POA: Diagnosis present

## 2022-05-12 DIAGNOSIS — I10 Essential (primary) hypertension: Secondary | ICD-10-CM | POA: Diagnosis present

## 2022-05-12 DIAGNOSIS — G9341 Metabolic encephalopathy: Secondary | ICD-10-CM | POA: Diagnosis present

## 2022-05-12 DIAGNOSIS — R55 Syncope and collapse: Principal | ICD-10-CM

## 2022-05-12 DIAGNOSIS — N401 Enlarged prostate with lower urinary tract symptoms: Secondary | ICD-10-CM | POA: Diagnosis present

## 2022-05-12 DIAGNOSIS — R42 Dizziness and giddiness: Secondary | ICD-10-CM

## 2022-05-12 DIAGNOSIS — E538 Deficiency of other specified B group vitamins: Secondary | ICD-10-CM | POA: Diagnosis present

## 2022-05-12 DIAGNOSIS — Z91014 Allergy to mammalian meats: Secondary | ICD-10-CM

## 2022-05-12 DIAGNOSIS — K219 Gastro-esophageal reflux disease without esophagitis: Secondary | ICD-10-CM | POA: Diagnosis present

## 2022-05-12 DIAGNOSIS — Z79899 Other long term (current) drug therapy: Secondary | ICD-10-CM

## 2022-05-12 DIAGNOSIS — N3000 Acute cystitis without hematuria: Principal | ICD-10-CM | POA: Diagnosis present

## 2022-05-12 DIAGNOSIS — Z8505 Personal history of malignant neoplasm of liver: Secondary | ICD-10-CM

## 2022-05-12 DIAGNOSIS — R4182 Altered mental status, unspecified: Secondary | ICD-10-CM

## 2022-05-12 DIAGNOSIS — B9620 Unspecified Escherichia coli [E. coli] as the cause of diseases classified elsewhere: Secondary | ICD-10-CM | POA: Diagnosis present

## 2022-05-12 DIAGNOSIS — D696 Thrombocytopenia, unspecified: Secondary | ICD-10-CM | POA: Diagnosis present

## 2022-05-12 DIAGNOSIS — Z8619 Personal history of other infectious and parasitic diseases: Secondary | ICD-10-CM

## 2022-05-12 DIAGNOSIS — K5900 Constipation, unspecified: Secondary | ICD-10-CM | POA: Diagnosis present

## 2022-05-12 DIAGNOSIS — C220 Liver cell carcinoma: Secondary | ICD-10-CM | POA: Diagnosis present

## 2022-05-12 DIAGNOSIS — I81 Portal vein thrombosis: Secondary | ICD-10-CM | POA: Diagnosis present

## 2022-05-12 DIAGNOSIS — B9689 Other specified bacterial agents as the cause of diseases classified elsewhere: Secondary | ICD-10-CM | POA: Diagnosis present

## 2022-05-12 LAB — MAGNESIUM: Magnesium: 2.1 mg/dL (ref 1.7–2.4)

## 2022-05-12 LAB — TSH: TSH: 10.888 u[IU]/mL — ABNORMAL HIGH (ref 0.350–4.500)

## 2022-05-12 LAB — LACTIC ACID, PLASMA
Lactic Acid, Venous: 2.2 mmol/L (ref 0.5–1.9)
Lactic Acid, Venous: 1.7 mmol/L (ref 0.5–1.9)

## 2022-05-12 LAB — COMPREHENSIVE METABOLIC PANEL
ALT: 25 U/L (ref 0–44)
AST: 54 U/L — ABNORMAL HIGH (ref 15–41)
Albumin: 3.3 g/dL — ABNORMAL LOW (ref 3.5–5.0)
Alkaline Phosphatase: 115 U/L (ref 38–126)
Anion gap: 6 (ref 5–15)
BUN: 11 mg/dL (ref 8–23)
CO2: 23 mmol/L (ref 22–32)
Calcium: 8.5 mg/dL — ABNORMAL LOW (ref 8.9–10.3)
Chloride: 106 mmol/L (ref 98–111)
Creatinine, Ser: 0.89 mg/dL (ref 0.61–1.24)
GFR, Estimated: >60 mL/min (ref >60)
Glucose, Bld: 139 mg/dL — ABNORMAL HIGH (ref 70–99)
Potassium: 3.9 mmol/L (ref 3.5–5.1)
Sodium: 135 mmol/L (ref 135–145)
Total Bilirubin: 0.8 mg/dL (ref 0.3–1.2)
Total Protein: 7.9 g/dL (ref 6.5–8.1)

## 2022-05-12 LAB — URINALYSIS, ROUTINE W REFLEX MICROSCOPIC
Bilirubin Urine: NEGATIVE
Glucose, UA: NEGATIVE mg/dL
Ketones, ur: NEGATIVE mg/dL
Nitrite: POSITIVE — AB
Protein, ur: NEGATIVE mg/dL
Specific Gravity, Urine: 1.006 (ref 1.005–1.030)
WBC, UA: >50 WBC/hpf — ABNORMAL HIGH (ref 0–5)
pH: 6 (ref 5.0–8.0)

## 2022-05-12 LAB — CBC WITH DIFFERENTIAL/PLATELET
Abs Immature Granulocytes: 0.01 10*3/uL (ref 0.00–0.07)
Basophils Absolute: 0 10*3/uL (ref 0.0–0.1)
Basophils Relative: 0 %
Eosinophils Absolute: 0.1 10*3/uL (ref 0.0–0.5)
Eosinophils Relative: 3 %
HCT: 39.9 % (ref 39.0–52.0)
Hemoglobin: 12.9 g/dL — ABNORMAL LOW (ref 13.0–17.0)
Immature Granulocytes: 0 %
Lymphocytes Relative: 17 %
Lymphs Abs: 0.8 10*3/uL (ref 0.7–4.0)
MCH: 34.4 pg — ABNORMAL HIGH (ref 26.0–34.0)
MCHC: 32.3 g/dL (ref 30.0–36.0)
MCV: 106.4 fL — ABNORMAL HIGH (ref 80.0–100.0)
Monocytes Absolute: 0.6 10*3/uL (ref 0.1–1.0)
Monocytes Relative: 14 %
Neutro Abs: 3 10*3/uL (ref 1.7–7.7)
Neutrophils Relative %: 66 %
Platelets: 134 10*3/uL — ABNORMAL LOW (ref 150–400)
RBC: 3.75 MIL/uL — ABNORMAL LOW (ref 4.22–5.81)
RDW: 14.9 % (ref 11.5–15.5)
WBC: 4.5 10*3/uL (ref 4.0–10.5)
nRBC: 0 % (ref 0.0–0.2)

## 2022-05-12 LAB — AMMONIA: Ammonia: 42 umol/L — ABNORMAL HIGH (ref 9–35)

## 2022-05-12 LAB — TROPONIN I (HIGH SENSITIVITY)
Troponin I (High Sensitivity): 11 ng/L (ref <18)
Troponin I (High Sensitivity): 11 ng/L (ref <18)

## 2022-05-12 LAB — LIPASE, BLOOD: Lipase: 32 U/L (ref 11–51)

## 2022-05-12 LAB — CK: Total CK: 96 U/L (ref 49–397)

## 2022-05-12 LAB — PROTIME-INR
INR: 1.2 (ref 0.8–1.2)
Prothrombin Time: 15 seconds (ref 11.4–15.2)

## 2022-05-12 MED ORDER — SODIUM CHLORIDE 0.9 % IV BOLUS
1000.0000 mL | Freq: Once | INTRAVENOUS | Status: AC
  Administered 2022-05-12: 1000 mL via INTRAVENOUS

## 2022-05-12 MED ORDER — SODIUM CHLORIDE 0.9 % IV SOLN
1.0000 g | Freq: Once | INTRAVENOUS | Status: AC
  Administered 2022-05-12: 1 g via INTRAVENOUS
  Filled 2022-05-12: qty 10

## 2022-05-12 NOTE — ED Notes (Signed)
Patient transported to CT 

## 2022-05-12 NOTE — ED Triage Notes (Addendum)
Pt to ED via EMS from home c/o Heat Exhaustion, family thought pt not acting self. apparently pt has no AC in home, had fans running , per EMS house extremely hot. Pt c/o feeling "bad and hot" also reports feeling dizzy. #18LAC 416m COLD fluid bolus given by EMS. Pt reports feeling better after leaving hot environment. .Marland KitchenNSR- Last VS: 136/74, HR 80, 96%RA, CBG 132. Primary Language Swahili. Hx: HTN, Hep C. [name] : [phone]

## 2022-05-12 NOTE — Telephone Encounter (Signed)
[employer] Video call completed. Patient wife  was able to show me the thermostat via video. Temperature reading 84 degrees. Thermostat is in off position. Instructed patient daughter in law to turn the thermostat to cool position and to adjust temperature down to 73 degrees. I will check in with them in an hour.   [name] Laina Guerrieri RN BSN [place] Nurse [phone]

## 2022-05-12 NOTE — ED Triage Notes (Signed)
Per EMS- patient is in a house with no air conditioning. Patient has a history of liver cancer. EMS reports when they first arrived the patient was confused, but oriented after receiving 250 ml NS.

## 2022-05-12 NOTE — ED Provider Notes (Signed)
[facility] DEPT Provider Note   CSN: [id number] Arrival date & time: 05/12/22  1545     History  Chief Complaint  Patient presents with   Heat Exposure    Jim Little is a 76 y.o. male.  The history is provided by the patient and medical records. No language interpreter was used.  Loss of Consciousness Episode history:  Single Most recent episode:  Today Timing:  Unable to specify Progression:  Unchanged Chronicity:  New Witnessed: yes   Relieved by:  Nothing Worsened by:  Nothing Ineffective treatments:  None tried Associated symptoms: headaches (1/10 mild ha on R side) and malaise/fatigue   Associated symptoms: no anxiety, no chest pain, no confusion, no diaphoresis, no difficulty breathing, no dizziness, no fever, no focal weakness, no nausea, no palpitations, no recent fall, no recent injury, no recent surgery, no rectal bleeding, no seizures, no shortness of breath, no vomiting and no weakness        Home Medications Prior to Admission medications   Medication Sig Start Date End Date Taking? Authorizing Provider  acetaminophen (TYLENOL) 500 MG tablet Take 500 mg by mouth 2 (two) times daily as needed for mild pain. Patient not taking: Reported on 04/24/2022    [provider]  amLODipine (NORVASC) 10 MG tablet Take 1 tablet (10 mg total) by mouth daily. 03/15/22   [name], MD  lactulose (KRISTALOSE) 10 g packet TAKE 1 PACKET (10 G TOTAL) BY MOUTH AS NEEDED (CONSTIPATION). DISSOLVE IN EIGHT OUNCES OF WATER Patient not taking: Reported on 04/24/2022 10/10/21   Truitt Merle, MD  lenvatinib 12 mg daily dose (LENVIMA, 12 MG DAILY DOSE,) 3 x 4 MG capsule Take 12 mg by mouth daily. Take as instructed by MD 05/07/22   Truitt Merle, MD  levothyroxine (SYNTHROID) 88 MCG tablet Take 1 tablet (88 mcg total) by mouth every morning. 30 minutes before food 03/08/22   [name], MD  Olopatadine HCl (PAZEO) 0.7 % SOLN Use once daily in both  eyes Patient not taking: Reported on 04/24/2022 11/01/21   [name], MD  omeprazole (PRILOSEC) 40 MG capsule Take 1 capsule (40 mg total) by mouth daily. 03/10/21   [name], MD  tamsulosin (FLOMAX) 0.4 MG CAPS capsule Take 1 capsule (0.4 mg total) by mouth daily. Please pack and mail to home 01/31/21   [name], MD  traMADol (ULTRAM) 50 MG tablet Take 1 tablet (50 mg total) by mouth every 12 (twelve) hours as needed for severe pain. 11/01/21   Truitt Merle, MD      Allergies    Pork-derived products    Review of Systems   Review of Systems  Constitutional:  Positive for chills, fatigue and malaise/fatigue. Negative for diaphoresis and fever.  Eyes:  Negative for visual disturbance.  Respiratory:  Negative for cough, chest tightness, shortness of breath and wheezing.   Cardiovascular:  Positive for syncope. Negative for chest pain and palpitations.  Gastrointestinal:  Negative for abdominal pain, constipation, diarrhea, nausea and vomiting.  Genitourinary:  Positive for dysuria.  Musculoskeletal:  Negative for back pain, neck pain and neck stiffness.  Skin:  Negative for rash and wound.  Neurological:  Positive for syncope, light-headedness and headaches (1/10 mild ha on R side). Negative for dizziness, focal weakness, seizures, facial asymmetry and weakness.  Psychiatric/Behavioral:  Negative for agitation and confusion.   All other systems reviewed and are negative.   Physical Exam Updated Vital Signs BP 133/74  Pulse 76   Temp 98.3 F (36.8 C) (Rectal)   Resp 16   Ht '5\' 6"'$  (1.676 m) Comment: per record  Wt 68.9 kg Comment: Per record  SpO2 100%   BMI 24.53 kg/m  Physical Exam Vitals and nursing note reviewed.  Constitutional:      General: He is not in acute distress.    Appearance: He is well-developed. He is not ill-appearing, toxic-appearing or diaphoretic.  HENT:     Head: Normocephalic and atraumatic.     Nose: No congestion or rhinorrhea.      Mouth/Throat:     Mouth: Mucous membranes are dry.     Pharynx: No oropharyngeal exudate or posterior oropharyngeal erythema.  Eyes:     Extraocular Movements: Extraocular movements intact.     Conjunctiva/sclera: Conjunctivae normal.     Pupils: Pupils are equal, round, and reactive to light.  Neck:     Vascular: No carotid bruit.  Cardiovascular:     Rate and Rhythm: Normal rate and regular rhythm.     Heart sounds: No murmur heard. Pulmonary:     Effort: Pulmonary effort is normal. No respiratory distress.     Breath sounds: Normal breath sounds.  Abdominal:     Palpations: Abdomen is soft.     Tenderness: There is no abdominal tenderness. There is no right CVA tenderness, left CVA tenderness, guarding or rebound.  Musculoskeletal:        General: No swelling or tenderness.     Cervical back: Neck supple. No tenderness.     Right lower leg: No edema.     Left lower leg: No edema.  Skin:    General: Skin is warm and dry.     Capillary Refill: Capillary refill takes less than 2 seconds.     Coloration: Skin is not pale.     Findings: No erythema or rash.  Neurological:     General: No focal deficit present.     Mental Status: He is alert.     Sensory: No sensory deficit.     Motor: No weakness.  Psychiatric:        Mood and Affect: Mood normal.     ED Results / Procedures / Treatments   Labs (all labs ordered are listed, but only abnormal results are displayed) Labs Reviewed  CBC WITH DIFFERENTIAL/PLATELET - Abnormal; Notable for the following components:      Result Value   RBC 3.75 (*)    Hemoglobin 12.9 (*)    MCV 106.4 (*)    MCH 34.4 (*)    Platelets 134 (*)    All other components within normal limits  COMPREHENSIVE METABOLIC PANEL - Abnormal; Notable for the following components:   Glucose, Bld 139 (*)    Calcium 8.5 (*)    Albumin 3.3 (*)    AST 54 (*)    All other components within normal limits  LACTIC ACID, PLASMA - Abnormal; Notable for the  following components:   Lactic Acid, Venous 2.2 (*)    All other components within normal limits  AMMONIA - Abnormal; Notable for the following components:   Ammonia 42 (*)    All other components within normal limits  TSH - Abnormal; Notable for the following components:   TSH 10.888 (*)    All other components within normal limits  URINALYSIS, ROUTINE W REFLEX MICROSCOPIC - Abnormal; Notable for the following components:   APPearance HAZY (*)    Hgb urine dipstick SMALL (*)  Nitrite POSITIVE (*)    Leukocytes,Ua MODERATE (*)    WBC, UA >50 (*)    Bacteria, UA MANY (*)    All other components within normal limits  URINE CULTURE  LACTIC ACID, PLASMA  LIPASE, BLOOD  PROTIME-INR  MAGNESIUM  CK  TROPONIN I (HIGH SENSITIVITY)  TROPONIN I (HIGH SENSITIVITY)    EKG EKG Interpretation  Date/Time:  Sunday May 12 2022 16:22:55 EDT Ventricular Rate:  85 PR Interval:  154 QRS Duration: 76 QT Interval:  361 QTC Calculation: 430 R Axis:   10 Text Interpretation: Sinus rhythm Abnormal R-wave progression, early transition Consider left ventricular hypertrophy Borderline T abnormalities, inferior leads when compared to prior, more artifact but previous t wave inversions are improved. No STEMI Confirmed by Antony Blackbird [phone]) on 05/12/2022 4:34:42 PM  Radiology CT HEAD WO CONTRAST (5MM)  Result Date: 05/12/2022 CLINICAL DATA:  Headache EXAM: CT HEAD WITHOUT CONTRAST TECHNIQUE: Contiguous axial images were obtained from the base of the skull through the vertex without intravenous contrast. RADIATION DOSE REDUCTION: This exam was performed according to the departmental dose-optimization program which includes automated exposure control, adjustment of the mA and/or kV according to patient size and/or use of iterative reconstruction technique. COMPARISON:  05/11/2020 FINDINGS: Brain: No evidence of acute infarction, hemorrhage, hydrocephalus, extra-axial collection or mass lesion/mass  effect. Moderate low-density changes within the periventricular and subcortical white matter compatible with chronic microvascular ischemic change. Mild diffuse cerebral volume loss. Vascular: No hyperdense vessel or unexpected calcification. Skull: Normal. Negative for fracture or focal lesion. Sinuses/Orbits: Unchanged left frontal sinus osteoma. Mucosal thickening within the ethmoid air cells. Other: None. IMPRESSION: 1. No acute intracranial abnormality. 2. Chronic microvascular ischemic change and cerebral volume loss. Electronically Signed   By: Davina Poke D.O.   On: 05/12/2022 17:11   DG Chest Portable 1 View  Result Date: 05/12/2022 CLINICAL DATA:  Syncope.  History of hepatocellular carcinoma EXAM: PORTABLE CHEST 1 VIEW COMPARISON:  CT chest 12/28/21 FINDINGS: Stable cardiomediastinal contours. The lung volumes are low and there is mild atelectasis identified in the left lung base. No signs of pleural effusion or edema. No airspace opacities. Calcified granuloma is noted within the right lower lobe. Visualized osseous structures are unremarkable. Both lungs are clear. The visualized skeletal structures are unremarkable. IMPRESSION: Low lung volumes and left base atelectasis. Electronically Signed   By: Kerby Moors M.D.   On: 05/12/2022 17:00    Procedures Procedures    Medications Ordered in ED Medications  sodium chloride 0.9 % bolus 1,000 mL (0 mLs Intravenous Stopped 05/12/22 2007)  cefTRIAXone (ROCEPHIN) 1 g in sodium chloride 0.9 % 100 mL IVPB (1 g Intravenous New Bag/Given 05/12/22 2008)    ED Course/ Medical Decision Making/ A&P                           Medical Decision Making Amount and/or Complexity of Data Reviewed Labs: ordered. Radiology: ordered.  Risk Decision regarding hospitalization.    Cassey Hurrell is a 76 y.o. male with a past medical history significant for hypertension, hepatocellular carcinoma currently on chemotherapy presents with feeling hot,  lightheadedness, near syncope, darkened urine, and altered mental status per family.  According to EMS, patient was inside in a very hot house and was acting confused and fatigued and had a near syncopal episode.  He was taken outside and given some cold fluids with EMS and that started to improve his symptoms but is still  feeling ill.  He is denying any chest pain or shortness of breath but felt like he was going to pass out.  Denies any new neck pain but does report some mild right-sided headache.  No neck stiffness reported.  He denies any nausea or vomiting but says that his urine was darker.  He denies any constipation or diarrhea.  He feels often that something is wrong with this fatigue.  On exam, lungs were clear and chest was nontender.  Abdomen was nontender and I did hear bowel sounds.  Back and flanks nontender.  Patient has dry mucous membranes and is very warm to the touch.  Rectal temperature was afebrile but oral temperature was 99.  Clinically I do suspect that this hot environment may have made him feel worse and got him dehydrated however with the darkened urine and chemotherapy use and near syncope/lightheaded episode I am concerned we need to work-up to look for electrolyte abnormality or occult infection.  Patient will have urinalysis, labs, will get a head CT with the headache, and will get chest x-ray to look for occult infection.  He will be given some more fluids and get cultures.   8:29 PM Patient's urinalysis does show evidence of UTI.  Lactic acid nonelevated.  He does not have a leukocytosis.  He was warm to the touch and had an oral temperature of 99 initially.  TSH is slightly elevated.  Ammonia slightly elevated.  CK nonelevated.  CBC shows no leukocytosis and only mild anemia.  CT of the head did not show acute abnormality and chest x-ray did not show pneumonia.  Other labs overall reassuring.  Clinically I am concerned that the patient has been feeling fatigued, tired,  and per EMS report from family "acting different" because of this UTI.  With his age and altered mental status/lightheaded/near syncope and chemotherapy use with this UTI, I do feel he needs admission for IV antibiotics.  Patient given Rocephin and we will call for admission for further management of altered mental status in the setting of UTI.         Final Clinical Impression(s) / ED Diagnoses Final diagnoses:  Near syncope  Acute cystitis without hematuria  Lightheadedness  Altered mental status, unspecified altered mental status type     Clinical Impression: 1. Near syncope   2. Acute cystitis without hematuria   3. Lightheadedness   4. Altered mental status, unspecified altered mental status type     Disposition: Admit  This note was prepared with assistance of Dragon voice recognition software. Occasional wrong-word or sound-a-like substitutions may have occurred due to the inherent limitations of voice recognition software.     Christoher Drudge, Gwenyth Allegra, MD 05/12/22 [phone]

## 2022-05-12 NOTE — Telephone Encounter (Signed)
Contacted by patient`s son Theadora Rama via phone. Stated that his dad became unresponsive while sitting outside. EMS was already on site and attending to patient. Assisted EMS with Swahili interpretation. Offered support to patient and family. Patient transported to [facility] ED.  [name] Isay Perleberg RN BSN [place] Nurse [phone]

## 2022-05-12 NOTE — ED Notes (Signed)
[place]allee Swahili interpretor used to assist with triage and communication , pt encouraged to provide urine sample per MD order, condom cath placed on pt.

## 2022-05-13 ENCOUNTER — Encounter (HOSPITAL_COMMUNITY): Payer: Self-pay | Admitting: Internal Medicine

## 2022-05-13 DIAGNOSIS — G9341 Metabolic encephalopathy: Secondary | ICD-10-CM | POA: Diagnosis not present

## 2022-05-13 DIAGNOSIS — N3000 Acute cystitis without hematuria: Secondary | ICD-10-CM | POA: Diagnosis present

## 2022-05-13 DIAGNOSIS — C220 Liver cell carcinoma: Secondary | ICD-10-CM

## 2022-05-13 DIAGNOSIS — N400 Enlarged prostate without lower urinary tract symptoms: Secondary | ICD-10-CM | POA: Diagnosis present

## 2022-05-13 DIAGNOSIS — K219 Gastro-esophageal reflux disease without esophagitis: Secondary | ICD-10-CM

## 2022-05-13 DIAGNOSIS — H109 Unspecified conjunctivitis: Secondary | ICD-10-CM | POA: Diagnosis present

## 2022-05-13 DIAGNOSIS — B9689 Other specified bacterial agents as the cause of diseases classified elsewhere: Secondary | ICD-10-CM | POA: Diagnosis present

## 2022-05-13 DIAGNOSIS — E032 Hypothyroidism due to medicaments and other exogenous substances: Secondary | ICD-10-CM

## 2022-05-13 DIAGNOSIS — I10 Essential (primary) hypertension: Secondary | ICD-10-CM

## 2022-05-13 LAB — COMPREHENSIVE METABOLIC PANEL
ALT: 22 U/L (ref 0–44)
AST: 56 U/L — ABNORMAL HIGH (ref 15–41)
Albumin: 3.1 g/dL — ABNORMAL LOW (ref 3.5–5.0)
Alkaline Phosphatase: 112 U/L (ref 38–126)
Anion gap: 6 (ref 5–15)
BUN: 11 mg/dL (ref 8–23)
CO2: 25 mmol/L (ref 22–32)
Calcium: 8.7 mg/dL — ABNORMAL LOW (ref 8.9–10.3)
Chloride: 109 mmol/L (ref 98–111)
Creatinine, Ser: 0.79 mg/dL (ref 0.61–1.24)
GFR, Estimated: >60 mL/min (ref >60)
Glucose, Bld: 113 mg/dL — ABNORMAL HIGH (ref 70–99)
Potassium: 4.1 mmol/L (ref 3.5–5.1)
Sodium: 140 mmol/L (ref 135–145)
Total Bilirubin: 0.8 mg/dL (ref 0.3–1.2)
Total Protein: 7.4 g/dL (ref 6.5–8.1)

## 2022-05-13 LAB — CBC WITH DIFFERENTIAL/PLATELET
Abs Immature Granulocytes: 0.01 10*3/uL (ref 0.00–0.07)
Basophils Absolute: 0 10*3/uL (ref 0.0–0.1)
Basophils Relative: 0 %
Eosinophils Absolute: 0.1 10*3/uL (ref 0.0–0.5)
Eosinophils Relative: 3 %
HCT: 37.6 % — ABNORMAL LOW (ref 39.0–52.0)
Hemoglobin: 12.3 g/dL — ABNORMAL LOW (ref 13.0–17.0)
Immature Granulocytes: 0 %
Lymphocytes Relative: 17 %
Lymphs Abs: 0.8 10*3/uL (ref 0.7–4.0)
MCH: 34.6 pg — ABNORMAL HIGH (ref 26.0–34.0)
MCHC: 32.7 g/dL (ref 30.0–36.0)
MCV: 105.6 fL — ABNORMAL HIGH (ref 80.0–100.0)
Monocytes Absolute: 0.8 10*3/uL (ref 0.1–1.0)
Monocytes Relative: 18 %
Neutro Abs: 2.8 10*3/uL (ref 1.7–7.7)
Neutrophils Relative %: 62 %
Platelets: 127 10*3/uL — ABNORMAL LOW (ref 150–400)
RBC: 3.56 MIL/uL — ABNORMAL LOW (ref 4.22–5.81)
RDW: 14.7 % (ref 11.5–15.5)
WBC: 4.5 10*3/uL (ref 4.0–10.5)
nRBC: 0 % (ref 0.0–0.2)

## 2022-05-13 LAB — FOLATE: Folate: 5.5 ng/mL — ABNORMAL LOW (ref >5.9)

## 2022-05-13 LAB — VITAMIN B12: Vitamin B-12: 512 pg/mL (ref 180–914)

## 2022-05-13 LAB — MAGNESIUM: Magnesium: 2.1 mg/dL (ref 1.7–2.4)

## 2022-05-13 MED ORDER — TAMSULOSIN HCL 0.4 MG PO CAPS
0.4000 mg | ORAL_CAPSULE | Freq: Every day | ORAL | Status: DC
  Administered 2022-05-13 – 2022-05-15 (×3): 0.4 mg via ORAL
  Filled 2022-05-13 (×3): qty 1

## 2022-05-13 MED ORDER — POLYETHYLENE GLYCOL 3350 17 G PO PACK: 17.0000 g | PACK | Freq: Every day | ORAL | Status: DC | PRN

## 2022-05-13 MED ORDER — SODIUM CHLORIDE 0.9 % IV SOLN: INTRAVENOUS | Status: DC

## 2022-05-13 MED ORDER — LENVATINIB (12 MG DAILY DOSE) 3 X 4 MG PO CPPK: 12.0000 mg | ORAL_CAPSULE | Freq: Every day | ORAL | Status: DC

## 2022-05-13 MED ORDER — FOLIC ACID 5 MG/ML IJ SOLN
1.0000 mg | Freq: Every day | INTRAMUSCULAR | Status: DC
Start: 2022-05-13 — End: 2022-05-13

## 2022-05-13 MED ORDER — SODIUM CHLORIDE 0.9 % IV SOLN: 1.0000 g | INTRAVENOUS | Status: DC

## 2022-05-13 MED ORDER — ACETAMINOPHEN 650 MG RE SUPP: 650.0000 mg | Freq: Four times a day (QID) | RECTAL | Status: DC | PRN

## 2022-05-13 MED ORDER — AMLODIPINE BESYLATE 10 MG PO TABS
10.0000 mg | ORAL_TABLET | Freq: Every day | ORAL | Status: DC
  Administered 2022-05-13 – 2022-05-16 (×4): 10 mg via ORAL
  Filled 2022-05-13 (×4): qty 1

## 2022-05-13 MED ORDER — LACTULOSE 10 GM/15ML PO SOLN
20.0000 g | Freq: Two times a day (BID) | ORAL | Status: DC
  Administered 2022-05-13 – 2022-05-16 (×7): 20 g via ORAL
  Filled 2022-05-13 (×7): qty 30

## 2022-05-13 MED ORDER — PANTOPRAZOLE SODIUM 40 MG PO TBEC
40.0000 mg | DELAYED_RELEASE_TABLET | Freq: Every day | ORAL | Status: DC
  Administered 2022-05-13 – 2022-05-16 (×4): 40 mg via ORAL
  Filled 2022-05-13 (×4): qty 1

## 2022-05-13 MED ORDER — SODIUM CHLORIDE 0.9 % IV SOLN
1.0000 g | Freq: Three times a day (TID) | INTRAVENOUS | Status: DC
  Administered 2022-05-13 – 2022-05-16 (×10): 1 g via INTRAVENOUS
  Filled 2022-05-13 (×6): qty 20
  Filled 2022-05-13: qty 1
  Filled 2022-05-13 (×3): qty 20
  Filled 2022-05-13: qty 1
  Filled 2022-05-13: qty 20

## 2022-05-13 MED ORDER — LEVOTHYROXINE SODIUM 88 MCG PO TABS
88.0000 ug | ORAL_TABLET | Freq: Every day | ORAL | Status: DC
  Administered 2022-05-13 – 2022-05-16 (×4): 88 ug via ORAL
  Filled 2022-05-13 (×4): qty 1

## 2022-05-13 MED ORDER — ONDANSETRON HCL 4 MG/2ML IJ SOLN: 4.0000 mg | Freq: Four times a day (QID) | INTRAMUSCULAR | Status: DC | PRN

## 2022-05-13 MED ORDER — CEFTRIAXONE SODIUM 1 G IJ SOLR: 1.0000 g | Freq: Once | INTRAMUSCULAR | Status: DC

## 2022-05-13 MED ORDER — FOLIC ACID 5 MG/ML IJ SOLN
1.0000 mg | Freq: Every day | INTRAMUSCULAR | Status: DC
  Administered 2022-05-13: 1 mg via INTRAVENOUS
  Filled 2022-05-13 (×2): qty 0.2

## 2022-05-13 MED ORDER — POLYMYXIN B-TRIMETHOPRIM 10000-0.1 UNIT/ML-% OP SOLN
1.0000 [drp] | Freq: Four times a day (QID) | OPHTHALMIC | Status: DC
  Administered 2022-05-13 – 2022-05-16 (×14): 1 [drp] via OPHTHALMIC
  Filled 2022-05-13: qty 10

## 2022-05-13 MED ORDER — RIVAROXABAN 10 MG PO TABS
10.0000 mg | ORAL_TABLET | Freq: Every day | ORAL | Status: DC
  Administered 2022-05-13 – 2022-05-16 (×4): 10 mg via ORAL
  Filled 2022-05-13 (×4): qty 1

## 2022-05-13 MED ORDER — ONDANSETRON HCL 4 MG PO TABS: 4.0000 mg | ORAL_TABLET | Freq: Four times a day (QID) | ORAL | Status: DC | PRN

## 2022-05-13 MED ORDER — ACETAMINOPHEN 325 MG PO TABS: 650.0000 mg | ORAL_TABLET | Freq: Four times a day (QID) | ORAL | Status: DC | PRN

## 2022-05-13 NOTE — Assessment & Plan Note (Addendum)
   Vague abdominal discomfort with urinalysis that is markedly abnormal concerning for urinary tract infection   This could potentially be the cause of the patient's encephalopathy  Treating with intravenous ceftriaxone for now  Blood and urine cultures obtained  Gentle intravenous hydration  Antibiotics to be de-escalated based on these results

## 2022-05-13 NOTE — Assessment & Plan Note (Signed)
   Patient presenting with 1 week history of progressively worsening lethargy confusion and poor oral intake  Thought to be multifactorial due to volume depletion and urinary tract infection  Hydrating patient gently with intravenous isotonic fluids  Treating underlying infection with intravenous ceftriaxone  Obtaining vitamin B12, folate, TSH, ammonia, noncontrast CT imaging of the head  Monitoring for symptomatic improvement

## 2022-05-13 NOTE — Progress Notes (Signed)
Plan of care communicated to the patient by using Interpreter  Kathlen Brunswick with # [phone] . Patient verbalized understanding.

## 2022-05-13 NOTE — Assessment & Plan Note (Addendum)
   Continue home regimen of Flomax  Due to degree of lower abdominal tenderness on exam will obtain bladder scan to ensure patient does not exhibiting recurrent urinary retention.

## 2022-05-13 NOTE — Progress Notes (Signed)
Pt c/o inability to empty bladder. Bladder scan performed with 200 ml reading. Reported to primary RN Tad Moore.

## 2022-05-13 NOTE — Assessment & Plan Note (Signed)
.   Resume home regimen of Synthroid 

## 2022-05-13 NOTE — Assessment & Plan Note (Signed)
Continuing home regimen of daily PPI therapy.  

## 2022-05-13 NOTE — Plan of Care (Signed)
Problem: Education: Goal: Knowledge of General Education information will improve Description: Including pain rating scale, medication(s)/side effects and non-pharmacologic comfort measures Outcome: Progressing   Problem: Health Behavior/Discharge Planning: Goal: Ability to manage health-related needs will improve Outcome: Progressing   Problem: Clinical Measurements: Goal: Ability to maintain clinical measurements within normal limits will improve Outcome: [place], RN 05/13/22 7:57 PM

## 2022-05-13 NOTE — Progress Notes (Signed)
Approximately an hour spent in patient's room discussing his medical history and offering emotional support.   Patient reported that he was diagnosed with Syphilis in the past. He began crying as he explained that he has 13 wives back in [place] and multiple disabled children. Patient is concerned that his disease has caused his current UTI and also the debility of his children. He reports retaining huge amounts of urine in the past and barely dribbling it out. Patient unable to clearly state how long this problem has existed.   Patient is hoping for answers regarding Syphilis, how it may have impacted his children, and also expresses concern about any necessary treatments moving forward.   Dr. Tyrell Antonio, SW [place], and director Chasity Hearn noted via electronic communication for follow-up as appropriate.  Ivan Anchors, RN 05/13/22 6:40 PM

## 2022-05-13 NOTE — Assessment & Plan Note (Addendum)
   Receiving ongoing Lenvatinib therapy which will be continued here  Adding Dr. Burr Medico to the treatment team to follow along during this hospitalization, her input is appreciated.

## 2022-05-13 NOTE — Assessment & Plan Note (Signed)
.   Resume patients home regimen of oral antihypertensives . Titrate antihypertensive regimen as necessary to achieve adequate BP control . PRN intravenous antihypertensives for excessively elevated blood pressure   

## 2022-05-13 NOTE — Evaluation (Signed)
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  Clinical Impression  Pt admitted with above diagnosis.  Pt currently with functional limitations due to the deficits listed below (see PT Problem List). Pt will benefit from skilled PT to increase their independence and safety with mobility to allow discharge to the venue listed below.   Pt reports generalized weakness and typically using "walking stick" at home.  Pt assisted with ambulating in room with RW.  Pt would benefit from RW and HHPT upon d/c.  Pt reports he plans to return home and states his family can assist him if needed.     Recommendations for follow up therapy are one component of a multi-disciplinary discharge planning process, led by the attending physician.  Recommendations may be updated based on patient status, additional functional criteria and insurance authorization.  Follow Up Recommendations Home health PT    Assistance Recommended at Discharge PRN  Patient can return home with the following  A little help with walking and/or transfers;A little help with bathing/dressing/bathroom;Help with stairs or ramp for entrance;Assist for transportation    Equipment Recommendations Rolling walker (2 wheels)  Recommendations for Other Services       Functional Status Assessment Patient has had a recent decline in their functional status and demonstrates the ability to make significant improvements in function in a reasonable and predictable amount of time.     Precautions / Restrictions         Mobility  Bed Mobility Overal bed mobility: Needs Assistance Bed Mobility: Supine to Sit     Supine to sit: HOB elevated, Supervision     General bed mobility comments: increased time and effort    Transfers Overall transfer level: Needs assistance Equipment used: Rolling walker (2 wheels) Transfers: Sit to/from Stand Sit to Stand: Min guard           General transfer comment: min/guard for safety    Ambulation/Gait Ambulation/Gait assistance: Min guard Gait Distance (Feet): 25 Feet Assistive device: Rolling walker (2 wheels) Gait Pattern/deviations: Step-through pattern, Decreased stride length, Trunk flexed Gait velocity: decr     General Gait Details: pt ambulated within room (ipad interpreter) and utilized RW due to reported weakness, fatigued quickly  Science writer    Modified Rankin (Stroke Patients Only)       Balance Overall balance assessment: Needs assistance         Standing balance support: Bilateral upper extremity supported, Reliant on assistive device for balance, During functional activity   Standing balance comment: pt utilized RW today due to generalized weakness, typically uses "walking stick"; no unsteadiness or LOB observed, pt denies falls                             Pertinent Vitals/Pain Pain Assessment Pain Assessment: No/denies pain    Home Living Family/patient expects to be discharged to:: Private residence Living Arrangements: Spouse/significant other Available Help at Discharge: Family Type of Home: House         Home Layout: Able to live on main level with bedroom/bathroom Home  Equipment: Kasandra Knudsen - single point      Prior Function Prior Level of Function : Independent/Modified Independent             Mobility Comments: pt states he uses a "walking stick"       Hand Dominance        Extremity/Trunk Assessment        Lower Extremity Assessment Lower  Extremity Assessment: Generalized weakness       Communication   Communication: Prefers language other than English;Interpreter utilized  Cognition Arousal/Alertness: Awake/alert Behavior During Therapy: Flat affect Overall Cognitive Status: Difficult to assess                                 General Comments: answers questions appropriately        General Comments      Exercises     Assessment/Plan    PT Assessment Patient needs continued PT services  PT Problem List Decreased strength;Decreased activity tolerance;Decreased mobility;Decreased balance;Decreased knowledge of use of DME       PT Treatment Interventions Gait training;DME instruction;Therapeutic exercise;Balance training;Functional mobility training;Therapeutic activities;Patient/family education    PT Goals (Current goals can be found in the Care Plan section)  Acute Rehab PT Goals PT Goal Formulation: With patient Time For Goal Achievement: 05/27/22 Potential to Achieve Goals: Good    Frequency Min 3X/week     Co-evaluation               AM-PAC PT "6 Clicks" Mobility  Outcome Measure Help needed turning from your back to your side while in a flat bed without using bedrails?: A Little Help needed moving from lying on your back to sitting on the side of a flat bed without using bedrails?: A Little Help needed moving to and from a bed to a chair (including a wheelchair)?: A Little Help needed standing up from a chair using your arms (e.g., wheelchair or bedside chair)?: A Little Help needed to walk in hospital room?: A Little Help needed climbing 3-5 steps with a railing? : A Lot 6 Click Score: 17    End of Session   Activity Tolerance: Patient tolerated treatment well Patient left: in chair;with call bell/phone within reach (pt aware to use call bell for assist out of recliner (used interpreter entire session))   PT Visit Diagnosis: Other abnormalities of gait and mobility  (R26.89)    Time: 8889-1694 PT Time Calculation (min) (ACUTE ONLY): 19 min   Charges:   PT Evaluation $PT Eval Low Complexity: 1 Low        Kati PT, DPT Acute Rehabilitation Services Pager: [phone] Office: [phone]   Myrtis Hopping [place] 05/13/2022, 4:10 PM

## 2022-05-13 NOTE — Progress Notes (Addendum)
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  History obtained from staff and EMS notes.  Patient has been experience generalized malaise, weakness for the last week.  He has been experiencing an episode of confusion.  Poor oral intake. On EMS arrival finding patient appearing confused in a house that was extremely hot with not working air conditioning.  Evaluation in the ED revealed urinalysis suggestive of UTI patient was started on IV antibiotics and IV fluids.     Assessment & Plan:   Principal Problem:   Acute metabolic encephalopathy Active Problems:   Acute cystitis without hematuria   Essential hypertension   Drug-induced hypothyroidism   Hepatocellular carcinoma (HCC)   BPH (benign prostatic hyperplasia)   GERD without esophagitis   1-Acute Metabolic Encephalopathy: Presents with poor oral intake. Lethargic.  Treating for infection.  Continue with IV fluids.  B 12: 521, ammonia 42 mildly elevated.  CT head: No acute intracranial abnormality. Chronic microvascular ischemic change and cerebral volume loss. He is alert, and oriented today.   Cystitis: UA with positive nitrates, more than 50 WBC>  H/O ESBL,  start Meropenem.  Follow urine culture.   Hypertension: Continue with Norvasc.   Drug induced Hypothyroidism: Continue with synthroid.   Hepatocellular Carcinoma;  Holding chemo due to acute infection.  Dr Burr Medico added to treatment team  Portal vein thrombosis. No anticoagulation recommend by oncology, since is tumor thrombus.  Folic acid deficiency; start folic acid.   BPH: Continue with  Flomax.   GERD: PPI  Patient told Nurse he has a history of  Syphilis:  Will need to further investigate. Will check RPR  I dont see history in chart.   Estimated body mass index is 24.53 kg/m as calculated from the following:   Height as of this encounter: '5\' 6"'$  (1.676 m).   Weight as of this encounter: 68.9 kg.   DVT prophylaxis: Xarelto. No pork products due to religious believes.  Code Status: Full code Family Communication: no family at bedside.  Disposition Plan:  Status is: Observation The patient remains OBS appropriate and will d/c before 2 midnights.    Consultants:  None  Procedures:  Noen  Antimicrobials:    Subjective: He is alert, use translator . He denies pain. He doesn't like food here. He wants to go home.   Objective: Vitals:   05/12/22 2100 05/12/22 2256 05/13/22 0054 05/13/22 0532  BP: 128/72 (!) 154/77 (!) 144/77 (!) 142/79  Pulse: 72 77 73 75  Resp: '15 18 18 18  '$ Temp: 98 F (36.7 C) 98.5 F (36.9 C) 98.5 F (36.9 C) 98.4 F (36.9 C)  TempSrc:  Oral Oral Oral  SpO2: 100% 99% 99% 99%  Weight:      Height:        Intake/Output Summary (Last 24 hours) at 05/13/2022 0733 Last data filed at 05/13/2022 0600 Gross per 24 hour  Intake 1008.36 ml  Output 1400 ml  Net -391.64 ml   Filed Weights   05/12/22 1614  Weight: 68.9 kg    Examination:  General exam: Appears calm and comfortable  Respiratory system: Clear to auscultation. Respiratory effort normal. Cardiovascular system: S1 & S2 heard, RRR. No JVD, murmurs, rubs,  gallops or clicks. No pedal edema. Gastrointestinal system: Abdomen is nondistended, soft and nontender. No organomegaly or masses felt. Normal bowel sounds heard. Central nervous system: Alert  Extremities: Symmetric 5 x 5 power.    Data Reviewed: I have personally reviewed following labs and imaging studies  CBC: Recent Labs  Lab 05/12/22 1628 05/13/22 0444  WBC 4.5 4.5  NEUTROABS 3.0 2.8  HGB 12.9* 12.3*   HCT 39.9 37.6*  MCV 106.4* 105.6*  PLT 134* 510*   Basic Metabolic Panel: Recent Labs  Lab 05/12/22 1628 05/13/22 0444  NA 135 140  K 3.9 4.1  CL 106 109  CO2 23 25  GLUCOSE 139* 113*  BUN 11 11  CREATININE 0.89 0.79  CALCIUM 8.5* 8.7*  MG 2.1 2.1   GFR: Estimated Creatinine Clearance: 68.7 mL/min (by C-G formula based on SCr of 0.79 mg/dL). Liver Function Tests: Recent Labs  Lab 05/12/22 1628 05/13/22 0444  AST 54* 56*  ALT 25 22  ALKPHOS 115 112  BILITOT 0.8 0.8  PROT 7.9 7.4  ALBUMIN 3.3* 3.1*   Recent Labs  Lab 05/12/22 1628  LIPASE 32   Recent Labs  Lab 05/12/22 1628  AMMONIA 42*   Coagulation Profile: Recent Labs  Lab 05/12/22 1628  INR 1.2   Cardiac Enzymes: Recent Labs  Lab 05/12/22 1628  CKTOTAL 96   BNP (last 3 results) No results for input(s): "PROBNP" in the last 8760 hours. HbA1C: No results for input(s): "HGBA1C" in the last 72 hours. CBG: No results for input(s): "GLUCAP" in the last 168 hours. Lipid Profile: No results for input(s): "CHOL", "HDL", "LDLCALC", "TRIG", "CHOLHDL", "LDLDIRECT" in the last 72 hours. Thyroid Function Tests: Recent Labs    05/12/22 1628  TSH 10.888*   Anemia Panel: Recent Labs    05/13/22 0444  VITAMINB12 512  FOLATE 5.5*   Sepsis Labs: Recent Labs  Lab 05/12/22 1628 05/12/22 1833  LATICACIDVEN 2.2* 1.7    No results found for this or any previous visit (from the past 240 hour(s)).       Radiology Studies: CT HEAD WO CONTRAST (5MM)  Result Date: 05/12/2022 CLINICAL DATA:  Headache EXAM: CT HEAD WITHOUT CONTRAST TECHNIQUE: Contiguous axial images were obtained from the base of the skull through the vertex without intravenous contrast. RADIATION DOSE REDUCTION: This exam was performed according to the departmental dose-optimization program which includes automated exposure control, adjustment of the mA and/or kV according to patient size and/or use of iterative reconstruction  technique. COMPARISON:  05/11/2020 FINDINGS: Brain: No evidence of acute infarction, hemorrhage, hydrocephalus, extra-axial collection or mass lesion/mass effect. Moderate low-density changes within the periventricular and subcortical white matter compatible with chronic microvascular ischemic change. Mild diffuse cerebral volume loss. Vascular: No hyperdense vessel or unexpected calcification. Skull: Normal. Negative for fracture or focal lesion. Sinuses/Orbits: Unchanged left frontal sinus osteoma. Mucosal thickening within the ethmoid air cells. Other: None. IMPRESSION: 1. No acute intracranial abnormality. 2. Chronic microvascular ischemic change and cerebral volume loss. Electronically Signed   By: Davina Poke D.O.   On: 05/12/2022 17:11   DG Chest Portable 1 View  Result Date: 05/12/2022 CLINICAL DATA:  Syncope.  History of hepatocellular carcinoma EXAM: PORTABLE CHEST 1 VIEW COMPARISON:  CT chest 12/28/21 FINDINGS: Stable cardiomediastinal contours. The lung volumes are low and there is mild atelectasis identified in the left lung base. No signs of pleural effusion or edema. No airspace opacities. Calcified granuloma is noted within the right lower lobe. Visualized osseous structures are  unremarkable. Both lungs are clear. The visualized skeletal structures are unremarkable. IMPRESSION: Low lung volumes and left base atelectasis. Electronically Signed   By: Kerby Moors M.D.   On: 05/12/2022 17:00        Scheduled Meds:  amLODipine  10 mg Oral Daily   lenvatinib 12 mg daily dose  12 mg Oral Daily   levothyroxine  88 mcg Oral Q0600   pantoprazole  40 mg Oral Daily   rivaroxaban  10 mg Oral Daily   tamsulosin  0.4 mg Oral Daily   Continuous Infusions:  sodium chloride 75 mL/hr at 05/13/22 0553   cefTRIAXone (ROCEPHIN)  IV       LOS: 0 days    Time spent: 35 Minutes.     Elmarie Shiley, MD [facility]   If 7PM-7AM, please contact  night-coverage [email or web address]  05/13/2022, 7:33 AM

## 2022-05-13 NOTE — H&P (Addendum)
History and Physical    Patient: Jim Little MRN: [id number] DOA: 05/12/2022  Date of Service: the patient was seen and examined on 05/13/2022  Patient coming from: Home via EMS  Chief Complaint:  Chief Complaint  Patient presents with   Heat Exposure   Dizziness    HPI:   75 year old Swahili speaking male with past medical history of cirrhosis (of unclear etiology) with hepatocellular carcinoma (Stage IIIA, on Leenvatinib since 03/13/2022), portal vein thrombosis (originally hospitalized 12/2020, not on anticoagulation), hypertension benign hepatic hyperplasia presenting to [facility] emergency department via EMS with complaints of confusion.  Patient is a poor historian.  Majority of history has been obtained from discussions with emergency department staff as well as review of triage and EMS notes.  My discussions with the patient were via a Swahili Financial planner.  Patient has been experiencing generalized malaise and weakness for at least the past week.  Over the span of time patient has been experiencing episodes of confusion.  Patient symptoms continue to worsen, associated with poor oral intake.  Patient has not been experiencing any fevers, shortness of breath, cough or diarrhea as of late.   Patient symptoms continue to worsen worrying family with family contacted EMS stating that the patient was "not acting right."  EMS arrived finding patient appearing confused in a house that was extremely hot with no working air conditioning.  Patient was brought into [facility] long hospital emergency permit for evaluation  Upon evaluation in the emergency department initial work-up revealed a urinalysis suggestive of urinary tract infection.  Patient was initiated on intravenous ceftriaxone and normal saline on the hospital scrip was then called to assess the patient for admission to the hospital.    Review of Systems: Review of Systems  Unable to perform ROS: Mental status  change     Past Medical History:  Diagnosis Date   BPH (benign prostatic hyperplasia)    Cirrhosis ([place])    Hypertension    Splenomegaly     Past Surgical History:  Procedure Laterality Date   BIOPSY  08/22/2020   Procedure: BIOPSY;  Surgeon: Ronnette Juniper, MD;  Location: [facility] ENDOSCOPY;  Service: Gastroenterology;;   COLONOSCOPY WITH PROPOFOL N/A 08/22/2020   Procedure: COLONOSCOPY WITH PROPOFOL;  Surgeon: Ronnette Juniper, MD;  Location: [facility] ENDOSCOPY;  Service: Gastroenterology;  Laterality: N/A;   ESOPHAGOGASTRODUODENOSCOPY (EGD) WITH PROPOFOL N/A 08/22/2020   Procedure: ESOPHAGOGASTRODUODENOSCOPY (EGD) WITH PROPOFOL;  Surgeon: Ronnette Juniper, MD;  Location: [facility] ENDOSCOPY;  Service: Gastroenterology;  Laterality: N/A;   IR ANGIOGRAM SELECTIVE EACH ADDITIONAL VESSEL  03/01/2021   IR ANGIOGRAM SELECTIVE EACH ADDITIONAL VESSEL  03/08/2021   IR ANGIOGRAM VISCERAL SELECTIVE  03/01/2021   IR ANGIOGRAM VISCERAL SELECTIVE  03/01/2021   IR ANGIOGRAM VISCERAL SELECTIVE  03/08/2021   IR EMBO TUMOR ORGAN ISCHEMIA INFARCT INC GUIDE ROADMAPPING  03/08/2021   IR RADIOLOGIST EVAL & MGMT  02/06/2021   IR RADIOLOGIST EVAL & MGMT  05/31/2021   IR US GUIDE VASC ACCESS RIGHT  03/01/2021   IR US GUIDE VASC ACCESS RIGHT  03/08/2021   UPPER GASTROINTESTINAL ENDOSCOPY  03/2018   [place] --candidiasis plus esophageal varices grade 2.  Treated with fluconazole and status post esophageal banding.   US ECHOCARDIOGRAPHY  10/2016   per overseas records from [place] - LVEF 68%; mildly calcified aortic valve, normal function otherwise    Social History:  reports that he has never smoked. He has never used smokeless tobacco. He reports that he does not  currently use drugs. He reports that he does not drink alcohol.  Allergies  Allergen Reactions   Pork-Derived Products     Religious reasons.Patient is a muslim    Family History  Problem Relation Age of Onset   Heart disease Neg Hx     Prior to Admission medications    Medication Sig Start Date End Date Taking? Authorizing Provider  amLODipine (NORVASC) 10 MG tablet Take 1 tablet (10 mg total) by mouth daily. 03/15/22  Yes [name], MD  lenvatinib 12 mg daily dose (LENVIMA, 12 MG DAILY DOSE,) 3 x 4 MG capsule Take 12 mg by mouth daily. Take as instructed by MD 05/07/22  Yes Truitt Merle, MD  levothyroxine (SYNTHROID) 88 MCG tablet Take 1 tablet (88 mcg total) by mouth every morning. 30 minutes before food 03/08/22  Yes [name], MD  acetaminophen (TYLENOL) 500 MG tablet Take 500 mg by mouth 2 (two) times daily as needed for mild pain. Patient not taking: Reported on 04/24/2022    [provider]  lactulose (KRISTALOSE) 10 g packet TAKE 1 PACKET (10 G TOTAL) BY MOUTH AS NEEDED (CONSTIPATION). DISSOLVE IN EIGHT OUNCES OF WATER Patient not taking: Reported on 04/24/2022 10/10/21   Truitt Merle, MD  Olopatadine HCl (PAZEO) 0.7 % SOLN Use once daily in both eyes Patient not taking: Reported on 04/24/2022 11/01/21   [name], MD  omeprazole (PRILOSEC) 40 MG capsule Take 1 capsule (40 mg total) by mouth daily. Patient not taking: Reported on 05/12/2022 03/10/21   [name], MD  tamsulosin (FLOMAX) 0.4 MG CAPS capsule Take 1 capsule (0.4 mg total) by mouth daily. Please pack and mail to home Patient not taking: Reported on 05/12/2022 01/31/21   [name], MD  traMADol (ULTRAM) 50 MG tablet Take 1 tablet (50 mg total) by mouth every 12 (twelve) hours as needed for severe pain. Patient not taking: Reported on 05/12/2022 11/01/21   Truitt Merle, MD    Physical Exam:  Vitals:   05/12/22 2100 05/12/22 2256 05/13/22 0054 05/13/22 0532  BP: 128/72 (!) 154/77 (!) 144/77 (!) 142/79  Pulse: 72 77 73 75  Resp: '15 18 18 18  '$ Temp: 98 F (36.7 C) 98.5 F (36.9 C) 98.5 F (36.9 C) 98.4 F (36.9 C)  TempSrc:  Oral Oral Oral  SpO2: 100% 99% 99% 99%  Weight:      Height:        Constitutional: Lethargic but arousable, oriented x3 no associated  distress.   Skin: no rashes, no lesions, poor skin turgor noted. Eyes: Notable injected sclera bilaterally with ongoing serous discharge of both eyes throughout the interview.  Pupils are equally reactive to light.  No evidence of scleral icterus or conjunctival pallor.  ENMT: Dry mucous membranes noted.  Posterior pharynx clear of any exudate or lesions.   Neck: normal, supple, no masses, no thyromegaly.  No evidence of jugular venous distension.   Respiratory: clear to auscultation bilaterally, no wheezing, no crackles. Normal respiratory effort. No accessory muscle use.  Cardiovascular: Regular rate and rhythm, no murmurs / rubs / gallops. No extremity edema. 2+ pedal pulses. No carotid bruits.  Chest:   Nontender without crepitus or deformity.   Back:   Nontender without crepitus or deformity. Abdomen: Substantial lower abdominal tenderness noted on exam.  Abdomen is soft however.  No evidence of intra-abdominal masses.  Positive bowel sounds noted in all quadrants.   Musculoskeletal: No joint deformity upper and lower extremities.  Good ROM, no contractures. Normal muscle tone.  Neurologic: CN 2-12 grossly intact. Sensation intact.  Patient moving all 4 extremities spontaneously.  Patient is following all commands.  Patient is responsive to verbal stimuli.   Psychiatric: Patient exhibits normal mood with appropriate affect.  Patient seems to possess insight as to their current situation.    Data Reviewed:  I have personally reviewed and interpreted labs, imaging.  Significant findings are:  CBC revealing white blood cell count of 4.5, hemoglobin 12.9, hematocrit 39.9, platelet count 134. Chemistry revealing sodium 135, potassium 3.9, chloride 106, glucose 139, BUN 11, creatinine 0.9. First troponin 2.2, second troponin 1.7. CT head without contrast personally reviewed revealing no acute intracranial abnormality.  EKG: Personally reviewed.  Rhythm is normal sinus rhythm with heart rate of  85 bpm.  No dynamic ST segment changes appreciated.   Assessment and Plan: * Acute metabolic encephalopathy Patient presenting with 1 week history of progressively worsening lethargy confusion and poor oral intake Thought to be multifactorial due to volume depletion and urinary tract infection Hydrating patient gently with intravenous isotonic fluids Treating underlying infection with intravenous ceftriaxone Obtaining vitamin B12, folate, TSH, ammonia, noncontrast CT imaging of the head Monitoring for symptomatic improvement  Acute cystitis without hematuria Vague abdominal discomfort with urinalysis that is markedly abnormal concerning for urinary tract infection  This could potentially be the cause of the patient's encephalopathy Treating with intravenous ceftriaxone for now Blood and urine cultures obtained Gentle intravenous hydration Antibiotics to be de-escalated based on these results  Bacterial conjunctivitis of both eyes Evidence of ongoing cloudy discharge from both eyes on exam. Eyes are injected with patient complaining of irritation of the eyes ongoing for at least several weeks Multiple possibilities including bacterial conjunctivitis or allergic conjunctivitis. Furthermore, patient vaguely described a sort of medical intervention in his eyes in [place] in the recent past although he was unable to communicate the details We will try a trial of Polytrim eyedrops 4 times daily to both eyes for 7 days and monitor for any evidence of symptomatic improvement.  Patient would likely also benefit from outpatient ophthalmology evaluation  Essential hypertension Resume patients home regimen of oral antihypertensives Titrate antihypertensive regimen as necessary to achieve adequate BP control PRN intravenous antihypertensives for excessively elevated blood pressure    Drug-induced hypothyroidism Resume home regimen of Synthroid    BPH (benign prostatic hyperplasia) Continue  home regimen of Flomax Due to degree of lower abdominal tenderness on exam will obtain bladder scan to ensure patient does not exhibiting recurrent urinary retention.  Hepatocellular carcinoma ([place]) Receiving ongoing Lenvatinib therapy which will be continued here Adding Dr. Burr Medico to the treatment team to follow along during this hospitalization, her input is appreciated.  GERD without esophagitis Continuing home regimen of daily PPI therapy.     Code Status:  Full code  code status decision has been confirmed with: patient   Consults: None  Severity of Illness:  The appropriate patient status for this patient is OBSERVATION. Observation status is judged to be reasonable and necessary in order to provide the required intensity of service to ensure the patient's safety. The patient's presenting symptoms, physical exam findings, and initial radiographic and laboratory data in the context of their medical condition is felt to place them at decreased risk for further clinical deterioration. Furthermore, it is anticipated that the patient will be medically stable for discharge from the hospital within 2 midnights of admission.   Author:  Vernelle Emerald MD  05/13/2022  8:40 AM

## 2022-05-13 NOTE — Assessment & Plan Note (Signed)
   Evidence of ongoing cloudy discharge from both eyes on exam.  Eyes are injected with patient complaining of irritation of the eyes ongoing for at least several weeks  Multiple possibilities including bacterial conjunctivitis or allergic conjunctivitis.  Furthermore, patient vaguely described a sort of medical intervention in his eyes in [place] in the recent past although he was unable to communicate the details  We will try a trial of Polytrim eyedrops 4 times daily to both eyes for 7 days and monitor for any evidence of symptomatic improvement.  Patient would likely also benefit from outpatient ophthalmology evaluation

## 2022-05-14 ENCOUNTER — Telehealth: Payer: Self-pay

## 2022-05-14 ENCOUNTER — Other Ambulatory Visit (HOSPITAL_COMMUNITY): Payer: Self-pay

## 2022-05-14 DIAGNOSIS — I10 Essential (primary) hypertension: Secondary | ICD-10-CM | POA: Diagnosis present

## 2022-05-14 DIAGNOSIS — Z91014 Allergy to mammalian meats: Secondary | ICD-10-CM | POA: Diagnosis not present

## 2022-05-14 DIAGNOSIS — G9341 Metabolic encephalopathy: Secondary | ICD-10-CM | POA: Diagnosis present

## 2022-05-14 DIAGNOSIS — C220 Liver cell carcinoma: Secondary | ICD-10-CM | POA: Diagnosis present

## 2022-05-14 DIAGNOSIS — K5900 Constipation, unspecified: Secondary | ICD-10-CM | POA: Diagnosis present

## 2022-05-14 DIAGNOSIS — D696 Thrombocytopenia, unspecified: Secondary | ICD-10-CM | POA: Diagnosis present

## 2022-05-14 DIAGNOSIS — H1089 Other conjunctivitis: Secondary | ICD-10-CM | POA: Diagnosis present

## 2022-05-14 DIAGNOSIS — E538 Deficiency of other specified B group vitamins: Secondary | ICD-10-CM | POA: Diagnosis present

## 2022-05-14 DIAGNOSIS — E032 Hypothyroidism due to medicaments and other exogenous substances: Secondary | ICD-10-CM | POA: Diagnosis present

## 2022-05-14 DIAGNOSIS — Z79890 Hormone replacement therapy (postmenopausal): Secondary | ICD-10-CM | POA: Diagnosis not present

## 2022-05-14 DIAGNOSIS — R42 Dizziness and giddiness: Secondary | ICD-10-CM | POA: Diagnosis present

## 2022-05-14 DIAGNOSIS — Z79899 Other long term (current) drug therapy: Secondary | ICD-10-CM | POA: Diagnosis not present

## 2022-05-14 DIAGNOSIS — Z8505 Personal history of malignant neoplasm of liver: Secondary | ICD-10-CM | POA: Diagnosis not present

## 2022-05-14 DIAGNOSIS — B9620 Unspecified Escherichia coli [E. coli] as the cause of diseases classified elsewhere: Secondary | ICD-10-CM | POA: Diagnosis present

## 2022-05-14 DIAGNOSIS — Z8619 Personal history of other infectious and parasitic diseases: Secondary | ICD-10-CM | POA: Diagnosis not present

## 2022-05-14 DIAGNOSIS — K7460 Unspecified cirrhosis of liver: Secondary | ICD-10-CM | POA: Diagnosis present

## 2022-05-14 DIAGNOSIS — K219 Gastro-esophageal reflux disease without esophagitis: Secondary | ICD-10-CM | POA: Diagnosis present

## 2022-05-14 DIAGNOSIS — N401 Enlarged prostate with lower urinary tract symptoms: Secondary | ICD-10-CM | POA: Diagnosis present

## 2022-05-14 DIAGNOSIS — I81 Portal vein thrombosis: Secondary | ICD-10-CM | POA: Diagnosis present

## 2022-05-14 DIAGNOSIS — R338 Other retention of urine: Secondary | ICD-10-CM | POA: Diagnosis present

## 2022-05-14 DIAGNOSIS — N3000 Acute cystitis without hematuria: Secondary | ICD-10-CM | POA: Diagnosis present

## 2022-05-14 LAB — BASIC METABOLIC PANEL
Anion gap: 4 — ABNORMAL LOW (ref 5–15)
BUN: 11 mg/dL (ref 8–23)
CO2: 22 mmol/L (ref 22–32)
Calcium: 8.4 mg/dL — ABNORMAL LOW (ref 8.9–10.3)
Chloride: 109 mmol/L (ref 98–111)
Creatinine, Ser: 0.87 mg/dL (ref 0.61–1.24)
GFR, Estimated: >60 mL/min (ref >60)
Glucose, Bld: 120 mg/dL — ABNORMAL HIGH (ref 70–99)
Potassium: 3.8 mmol/L (ref 3.5–5.1)
Sodium: 135 mmol/L (ref 135–145)

## 2022-05-14 LAB — CBC
HCT: 33.8 % — ABNORMAL LOW (ref 39.0–52.0)
Hemoglobin: 11.8 g/dL — ABNORMAL LOW (ref 13.0–17.0)
MCH: 35.9 pg — ABNORMAL HIGH (ref 26.0–34.0)
MCHC: 34.9 g/dL (ref 30.0–36.0)
MCV: 102.7 fL — ABNORMAL HIGH (ref 80.0–100.0)
Platelets: 98 10*3/uL — ABNORMAL LOW (ref 150–400)
RBC: 3.29 MIL/uL — ABNORMAL LOW (ref 4.22–5.81)
RDW: 14.6 % (ref 11.5–15.5)
WBC: 4.5 10*3/uL (ref 4.0–10.5)
nRBC: 0 % (ref 0.0–0.2)

## 2022-05-14 LAB — HIV ANTIBODY (ROUTINE TESTING W REFLEX): HIV Screen 4th Generation wRfx: NONREACTIVE

## 2022-05-14 LAB — RPR: RPR Ser Ql: NONREACTIVE

## 2022-05-14 MED ORDER — FOLIC ACID 1 MG PO TABS
1.0000 mg | ORAL_TABLET | Freq: Every day | ORAL | Status: DC
Start: 2022-05-14 — End: 2022-05-16
  Administered 2022-05-14 – 2022-05-16 (×3): 1 mg via ORAL
  Filled 2022-05-14 (×3): qty 1

## 2022-05-14 NOTE — Telephone Encounter (Signed)
Called patient to offer support during hospitalization. Remains in patient status getting IV antibiotics for UTI.Possible DC 1-2 days.I will reschedule radiation treatment accordingly. Upon discharge, please send discharge prescription to [facility] and call his [place] 336 [phone] for payments discharge education and transportation.  [name] Tatia Petrucci RN BSN [place] Nurse [phone] [phone]

## 2022-05-14 NOTE — TOC Initial Note (Signed)
Transition of Care [facility]) - Initial/Assessment Note    Patient Details  Name: Jim Little MRN: [id number] Date of Birth: [birth year]  Transition of Care [facility]) CM/SW Contact:    Vassie Moselle, LCSW Phone Number: 05/14/2022, 11:23 AM  Clinical Narrative:                 Met with pt and discussed plans for discharge. Pt is agreeable to home health services. Pt shares that he and his family moved to [place] around 2 years ago. He states that they have been struggling to care for themselves as his family has many medical needs. He has two young children who have disabilities that require a lot of attention and care. He has no supports in this area and has not been connected to resources in the area prior to now. He states that he is the family breadwinner however, he has been unable to work and he is concerned about paying his rent and other financial needs. CSW provided this pt with resources in the area that he can be connected with for support with food, finances, childcare, healthcare, interpreters, immigration/refugee assistance, being connected with community and other supports.   Pt requires rw and Danielle with [employer] has delivered this to pt's room. CSW has reached out to [facility] agencies in attempt to [place] for this pt however, there is currently no [place] agency that is able to provide [facility] services for this pt due to insurance. CSW will explore OPPT as an option for this pt.    Expected Discharge Plan: [place] Barriers to Discharge: Continued Medical Work up   Patient Goals and CMS Choice Patient states their goals for this hospitalization and ongoing recovery are:: To return home   Choice offered to / list presented to : Patient  Expected Discharge Plan and Services Expected Discharge Plan: [place] In-house Referral: Clinical Social Work Discharge Planning Services: CM Consult Post Acute Care Choice: Durable Medical Equipment, Home Health Living  arrangements for the past 2 months: Single Family Home                 DME Arranged: Walker rolling DME Agency: [employer] Date DME Agency Contacted: 05/14/22 Time DME Agency Contacted: 92 Representative spoke with at DME Agency: [place] Arranged: PT          Prior Living Arrangements/Services Living arrangements for the past 2 months: [place] with:: Spouse, Minor Children Patient language and need for interpreter reviewed:: Yes Do you feel safe going back to the place where you live?: Yes      Need for Family Participation in Patient Care: No (Comment) Care giver support system in place?: No (comment)   Criminal Activity/Legal Involvement Pertinent to Current Situation/Hospitalization: No - Comment as needed  Activities of Daily Living Home Assistive Devices/Equipment: None ADL Screening (condition at time of admission) Patient's cognitive ability adequate to safely complete daily activities?: Yes Is the patient deaf or have difficulty hearing?: No Does the patient have difficulty seeing, even when wearing glasses/contacts?: No Does the patient have difficulty concentrating, remembering, or making decisions?: No Patient able to express need for assistance with ADLs?: Yes Does the patient have difficulty dressing or bathing?: No Independently performs ADLs?: Yes (appropriate for developmental age) Does the patient have difficulty walking or climbing stairs?: No Weakness of Legs: None Weakness of Arms/Hands: None  Permission Sought/Granted  Emotional Assessment Appearance:: Appears stated age Attitude/Demeanor/Rapport: Crying, Engaged, Gracious Affect (typically observed): Overwhelmed, Tearful/Crying Orientation: : Oriented to Self, Oriented to Place, Oriented to  Time, Oriented to Situation Alcohol / Substance Use: Not Applicable Psych Involvement: No (comment)  Admission diagnosis:  Lightheadedness [R42] Acute cystitis  without hematuria [N30.00] Near syncope [R55] Altered mental status, unspecified altered mental status type [Y63.78] Acute metabolic encephalopathy [H88.50] Patient Active Problem List   Diagnosis Date Noted   Acute cystitis without hematuria 05/13/2022   BPH (benign prostatic hyperplasia) 05/13/2022   GERD without esophagitis 05/13/2022   Bacterial conjunctivitis of both eyes 27/74/1287   Acute metabolic encephalopathy 86/76/7209   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   Scrotal hernia 04/11/2021   Scrotal pain 03/27/2021   Infective urethritis 03/27/2021   Hepatocellular carcinoma ([place]) 02/09/2021   Abdominal pain 02/09/2021   Hypertension 02/09/2021   Weight loss 02/09/2021   Portal vein thrombosis 01/13/2021   Urinary retention 05/11/2020   Thrombocytopenia ([place]) 05/11/2020   Bilateral cataracts 07/15/2018   Constipation 05/06/2018   Refugee health examination 04/16/2018   Splenomegaly 04/16/2018   Chronic back pain 04/16/2018   Hepatic cirrhosis ([place]) 04/16/2018   Essential hypertension 04/16/2018   PCP:  [name], MD Pharmacy:   [place], [place] [address] Phone: [phone] Fax: [phone]  [place] [address] [place] [address] Phone: [phone] Fax: [phone]     Social Determinants of Health (SDOH) Interventions    Readmission Risk Interventions     No data to display

## 2022-05-14 NOTE — Progress Notes (Signed)
PROGRESS NOTE    Jim Little  WGY:[id number] DOB: [birth year] DOA: 05/12/2022 PCP: [name], MD   Brief Narrative: 76 year old with past medical history significant for cirrhosis (unclear etiology), with hepatocellular carcinoma (stage IIIa on Leenvatinib since 03/13/2022), portal vein thrombosis (hospitalized 10/2021, not on anticoagulation), hypertension, presented to [facility] with complaint of confusion.  History obtained from staff and EMS notes.  Patient has been experience generalized malaise, weakness for the last week.  He has been experiencing an episode of confusion.  Poor oral intake. On EMS arrival finding patient appearing confused in a house that was extremely hot with not working air conditioning.  Evaluation in the ED revealed urinalysis suggestive of UTI patient was started on IV antibiotics and IV fluids. He has prior history of ESBL. He was started on Meropenem. Awaiting urine cultures results prior to discharge.    Assessment & Plan:   Principal Problem:   Acute metabolic encephalopathy Active Problems:   Acute cystitis without hematuria   Bacterial conjunctivitis of both eyes   Essential hypertension   Drug-induced hypothyroidism   Hepatocellular carcinoma (HCC)   BPH (benign prostatic hyperplasia)   GERD without esophagitis   1-Acute Metabolic Encephalopathy: Presents with poor oral intake. Lethargic.  Treating for infection.  Continue with IV fluids.  B 12: 521, ammonia 42 mildly elevated.  CT head: No acute intracranial abnormality. Chronic microvascular ischemic change and cerebral volume loss. He is alert, and oriented. Improved.   Cystitis: UA with positive nitrates, more than 50 WBC>  H/O ESBL,  started Meropenem.  Follow urine culture. Growing gram negative rods.  Awaiting urine culture results to determine antibiotics at discharge.   Hypertension: Continue with Norvasc.   Drug induced Hypothyroidism: Continue with synthroid.    Hepatocellular Carcinoma;  Holding chemo due to acute infection.  Dr Burr Medico added to treatment team  Portal vein thrombosis. No anticoagulation recommend by oncology, since is tumor thrombus. Monitor thrombocytopenia.   Folic acid deficiency; start folic acid.   BPH: Continue with Flomax.   GERD: PPI  Patient told Nurse he has a history of  Syphilis:  RPR: Non reactive He relates that was years ago. He doesn't have any questions for me.    Estimated body mass index is 24.53 kg/m as calculated from the following:   Height as of this encounter: '5\' 6"'$  (1.676 m).   Weight as of this encounter: 68.9 kg.   DVT prophylaxis: Xarelto. No pork products due to religious believes.  Code Status: Full code Family Communication: no family at bedside.  Disposition Plan:  Status is: Observation The patient remains OBS appropriate and will d/c before 2 midnights.    Consultants:  None  Procedures:  [place]  Antimicrobials:    Subjective: Use I pad translator.  He denies pain. His main concern is t go home. I explain to him we need to wait for urine culture results to determine which antibiotics will send him home with.   Objective: Vitals:   05/13/22 1900 05/14/22 0536 05/14/22 1009 05/14/22 1339  BP: (!) 150/89 125/77 139/78 140/77  Pulse: 80 81 82 82  Resp:    16  Temp: 98.9 F (37.2 C) 99.2 F (37.3 C)  98.4 F (36.9 C)  TempSrc: Oral Oral  Oral  SpO2: 100% 99%  99%  Weight:      Height:        Intake/Output Summary (Last 24 hours) at 05/14/2022 1550 Last data filed at 05/14/2022 1500 Gross per 24 hour  Intake 2472.64 ml  Output 900 ml  Net 1572.64 ml    Filed Weights   05/12/22 1614  Weight: 68.9 kg    Examination:  General exam: NAD Respiratory system: CTA Cardiovascular system: S,1  S  2 RRR Gastrointestinal system: BS present, soft, nt Central nervous system: Alert, follows command Extremities: No edema    Data Reviewed: I have personally  reviewed following labs and imaging studies  CBC: Recent Labs  Lab 05/12/22 1628 05/13/22 0444 05/14/22 0715  WBC 4.5 4.5 4.5  NEUTROABS 3.0 2.8  --   HGB 12.9* 12.3* 11.8*  HCT 39.9 37.6* 33.8*  MCV 106.4* 105.6* 102.7*  PLT 134* 127* 98*    Basic Metabolic Panel: Recent Labs  Lab 05/12/22 1628 05/13/22 0444 05/14/22 0904  NA 135 140 135  K 3.9 4.1 3.8  CL 106 109 109  CO2 '23 25 22  '$ GLUCOSE 139* 113* 120*  BUN '11 11 11  '$ CREATININE 0.89 0.79 0.87  CALCIUM 8.5* 8.7* 8.4*  MG 2.1 2.1  --     GFR: Estimated Creatinine Clearance: 63.1 mL/min (by C-G formula based on SCr of 0.87 mg/dL). Liver Function Tests: Recent Labs  Lab 05/12/22 1628 05/13/22 0444  AST 54* 56*  ALT 25 22  ALKPHOS 115 112  BILITOT 0.8 0.8  PROT 7.9 7.4  ALBUMIN 3.3* 3.1*    Recent Labs  Lab 05/12/22 1628  LIPASE 32    Recent Labs  Lab 05/12/22 1628  AMMONIA 42*    Coagulation Profile: Recent Labs  Lab 05/12/22 1628  INR 1.2    Cardiac Enzymes: Recent Labs  Lab 05/12/22 1628  CKTOTAL 96    BNP (last 3 results) No results for input(s): "PROBNP" in the last 8760 hours. HbA1C: No results for input(s): "HGBA1C" in the last 72 hours. CBG: No results for input(s): "GLUCAP" in the last 168 hours. Lipid Profile: No results for input(s): "CHOL", "HDL", "LDLCALC", "TRIG", "CHOLHDL", "LDLDIRECT" in the last 72 hours. Thyroid Function Tests: Recent Labs    05/12/22 1628  TSH 10.888*    Anemia Panel: Recent Labs    05/13/22 0444  VITAMINB12 512  FOLATE 5.5*    Sepsis Labs: Recent Labs  Lab 05/12/22 1628 05/12/22 1833  LATICACIDVEN 2.2* 1.7     Recent Results (from the past 240 hour(s))  Urine Culture     Status: Abnormal (Preliminary result)   Collection Time: 05/12/22  6:44 PM   Specimen: Urine, Clean Catch  Result Value Ref Range Status   Specimen Description   Final    URINE, CLEAN CATCH Performed at [facility], [place] [address] [place], [place] [address]    Special Requests   Final    NONE Performed at [facility], [place] [address], [place], [place] [address]    Culture >=100,000 COLONIES/mL GRAM NEGATIVE RODS (A)  Final   Report Status PENDING  Incomplete  Culture, blood (Routine X 2) w Reflex to ID Panel     Status: None (Preliminary result)   Collection Time: 05/13/22  5:02 AM   Specimen: BLOOD  Result Value Ref Range Status   Specimen Description   Final    BLOOD RIGHT ANTECUBITAL Performed at [place] [address], [place], [place] [address]    Special Requests   Final    BOTTLES DRAWN AEROBIC ONLY Blood Culture adequate volume Performed at [place] [address] [place], [place] [address]  Culture   Final    NO GROWTH < 24 HOURS Performed at [facility], [place] [address], [place], [place] [address]    Report Status PENDING  Incomplete  Culture, blood (Routine X 2) w Reflex to ID Panel     Status: None (Preliminary result)   Collection Time: 05/13/22  5:03 AM   Specimen: BLOOD RIGHT HAND  Result Value Ref Range Status   Specimen Description   Final    BLOOD RIGHT HAND Performed at [facility], [place] [address] [place], [place] [address]    Special Requests   Final    BOTTLES DRAWN AEROBIC ONLY Blood Culture adequate volume Performed at [place] [address], [place], [place] [address]    Culture   Final    NO GROWTH < 24 HOURS Performed at [place] [address], [place], [place] [address]    Report Status PENDING  Incomplete         Radiology Studies: CT HEAD WO CONTRAST (5MM)  Result Date: 05/12/2022 CLINICAL DATA:  Headache EXAM: CT HEAD WITHOUT CONTRAST TECHNIQUE: Contiguous axial images were obtained from the base of the skull through the vertex without intravenous contrast. RADIATION DOSE REDUCTION: This exam was performed according to the departmental  dose-optimization program which includes automated exposure control, adjustment of the mA and/or kV according to patient size and/or use of iterative reconstruction technique. COMPARISON:  05/11/2020 FINDINGS: Brain: No evidence of acute infarction, hemorrhage, hydrocephalus, extra-axial collection or mass lesion/mass effect. Moderate low-density changes within the periventricular and subcortical white matter compatible with chronic microvascular ischemic change. Mild diffuse cerebral volume loss. Vascular: No hyperdense vessel or unexpected calcification. Skull: Normal. Negative for fracture or focal lesion. Sinuses/Orbits: Unchanged left frontal sinus osteoma. Mucosal thickening within the ethmoid air cells. Other: None. IMPRESSION: 1. No acute intracranial abnormality. 2. Chronic microvascular ischemic change and cerebral volume loss. Electronically Signed   By: Davina Poke D.O.   On: 05/12/2022 17:11   DG Chest Portable 1 View  Result Date: 05/12/2022 CLINICAL DATA:  Syncope.  History of hepatocellular carcinoma EXAM: PORTABLE CHEST 1 VIEW COMPARISON:  CT chest 12/28/21 FINDINGS: Stable cardiomediastinal contours. The lung volumes are low and there is mild atelectasis identified in the left lung base. No signs of pleural effusion or edema. No airspace opacities. Calcified granuloma is noted within the right lower lobe. Visualized osseous structures are unremarkable. Both lungs are clear. The visualized skeletal structures are unremarkable. IMPRESSION: Low lung volumes and left base atelectasis. Electronically Signed   By: Kerby Moors M.D.   On: 05/12/2022 17:00        Scheduled Meds:  amLODipine  10 mg Oral Daily   folic acid  1 mg Oral Daily   lactulose  20 g Oral BID   levothyroxine  88 mcg Oral Q0600   pantoprazole  40 mg Oral Daily   rivaroxaban  10 mg Oral Daily   tamsulosin  0.4 mg Oral Daily   trimethoprim-polymyxin b  1 drop Both Eyes QID   Continuous Infusions:  sodium  chloride 75 mL/hr at 05/14/22 1012   meropenem (MERREM) IV 1 g (05/14/22 1014)     LOS: 0 days    Time spent: 35 Minutes.     Elmarie Shiley, MD [facility]   If 7PM-7AM, please contact night-coverage [email or web address]  05/14/2022, 3:50 PM

## 2022-05-15 ENCOUNTER — Other Ambulatory Visit (HOSPITAL_COMMUNITY): Payer: Self-pay

## 2022-05-15 DIAGNOSIS — G9341 Metabolic encephalopathy: Secondary | ICD-10-CM | POA: Diagnosis not present

## 2022-05-15 LAB — BASIC METABOLIC PANEL
Anion gap: 5 (ref 5–15)
BUN: 9 mg/dL (ref 8–23)
CO2: 26 mmol/L (ref 22–32)
Calcium: 8.6 mg/dL — ABNORMAL LOW (ref 8.9–10.3)
Chloride: 109 mmol/L (ref 98–111)
Creatinine, Ser: 0.75 mg/dL (ref 0.61–1.24)
GFR, Estimated: >60 mL/min (ref >60)
Glucose, Bld: 105 mg/dL — ABNORMAL HIGH (ref 70–99)
Potassium: 3.9 mmol/L (ref 3.5–5.1)
Sodium: 140 mmol/L (ref 135–145)

## 2022-05-15 LAB — CBC
HCT: 34.4 % — ABNORMAL LOW (ref 39.0–52.0)
Hemoglobin: 11.3 g/dL — ABNORMAL LOW (ref 13.0–17.0)
MCH: 34.6 pg — ABNORMAL HIGH (ref 26.0–34.0)
MCHC: 32.8 g/dL (ref 30.0–36.0)
MCV: 105.2 fL — ABNORMAL HIGH (ref 80.0–100.0)
Platelets: 113 10*3/uL — ABNORMAL LOW (ref 150–400)
RBC: 3.27 MIL/uL — ABNORMAL LOW (ref 4.22–5.81)
RDW: 14.6 % (ref 11.5–15.5)
WBC: 4.3 10*3/uL (ref 4.0–10.5)
nRBC: 0 % (ref 0.0–0.2)

## 2022-05-15 MED ORDER — TAMSULOSIN HCL 0.4 MG PO CAPS
0.8000 mg | ORAL_CAPSULE | Freq: Every day | ORAL | Status: DC
  Administered 2022-05-16: 0.8 mg via ORAL
  Filled 2022-05-15: qty 2

## 2022-05-15 NOTE — TOC Transition Note (Signed)
Transition of Care [facility]) - CM/SW Discharge Note   Patient Details  Name: Jim Little MRN: [id number] Date of Birth: 03/26/1946  Transition of Care [facility]) CM/SW Contact:  Vassie Moselle, LCSW Phone Number: 05/15/2022, 1:39 PM   Clinical Narrative:    Met with pt and discussed barriers in obtaining [facility] services. Pt had his case worker, Honor Loh [phone]) on the phone for the beginning of the interaction. She states that they have tried to have home health services for this pt in the past however, services failed due to the language barrier. Both pt and case worker are in agreement for pt to complete OPPT as there is a better access to interpretation services in the office based setting. Pt is able to use medicaid transportation to get to his appointments and states his case worker has assisted in setting this up for him in the past. A referral has been made for OPPT for this pt. Pt had rw delivered to room.   Pt has also been referred for palliative care through [place].   No further TOC needs identified at this time. TOC is signing off. If further needs arise please re-consult TOC.  Final next level of care: Home/Self Care Barriers to Discharge: Barriers Resolved, No Home Care Agency will accept this patient   Patient Goals and CMS Choice Patient states their goals for this hospitalization and ongoing recovery are:: To return home   Choice offered to / list presented to : Patient  Discharge Placement                       Discharge Plan and Services In-house Referral: Clinical Social Work Discharge Planning Services: CM Consult Post Acute Care Choice: Durable Medical Equipment, Home Health          DME Arranged: Walker rolling DME Agency: [employer] Date DME Agency Contacted: 05/14/22 Time DME Agency Contacted: 10 Representative spoke with at DME Agency: [place]: PT          Social Determinants of Health ([place]) Interventions      Readmission Risk Interventions    05/15/2022    1:37 PM  Readmission Risk Prevention Plan  Post Dischage Appt Complete  Medication Screening Complete  Transportation Screening Complete

## 2022-05-15 NOTE — Progress Notes (Signed)
Physical Therapy Treatment Patient Details Name: Jim Little MRN: [id number] DOB: July 06, 1946 Today's Date: 05/15/2022   History of Present Illness 76 year old Swahili speaking male with past medical history of cirrhosis (of unclear etiology) with hepatocellular carcinoma (Stage IIIA, on Leenvatinib since 03/13/2022), portal vein thrombosis (originally hospitalized 12/2020, not on anticoagulation), hypertension, benign hepatic hyperplasia and admitted 05/12/22 for acute metabolic encephalopathy.    PT Comments    Pt assisted with ambulating in hallway and using RW.  Current plan is for d/c home with OPPT services as interpreter is more accessible.    Recommendations for follow up therapy are one component of a multi-disciplinary discharge planning process, led by the attending physician.  Recommendations may be updated based on patient status, additional functional criteria and insurance authorization.  Follow Up Recommendations  Home health PT (plan for OPPT as interpreter more easily accessible per [facility] notes)     Assistance Recommended at Discharge PRN  Patient can return home with the following A little help with walking and/or transfers;A little help with bathing/dressing/bathroom;Help with stairs or ramp for entrance;Assist for transportation   Equipment Recommendations  Rolling walker (2 wheels)    Recommendations for Other Services       Precautions / Restrictions Precautions Precautions: Fall     Mobility  Bed Mobility Overal bed mobility: Needs Assistance Bed Mobility: Supine to Sit     Supine to sit: HOB elevated, Supervision     General bed mobility comments: increased time and effort    Transfers Overall transfer level: Needs assistance Equipment used: Rolling walker (2 wheels) Transfers: Sit to/from Stand Sit to Stand: Min guard           General transfer comment: min/guard for safety    Ambulation/Gait Ambulation/Gait assistance: Min guard, Min  assist Gait Distance (Feet): 350 Feet Assistive device: Rolling walker (2 wheels) Gait Pattern/deviations: Step-through pattern, Decreased stride length, Trunk flexed Gait velocity: decr     General Gait Details: brought ipad intrepreter along, pt denies dizziness, ambulated to tolerance, heavy reliance on UE support through RW however steady with RW, light assist with negotiating RW around objects   Stairs             Wheelchair Mobility    Modified Rankin (Stroke Patients Only)       Balance                                            Cognition Arousal/Alertness: Awake/alert Behavior During Therapy: Flat affect Overall Cognitive Status: Difficult to assess                                 General Comments: answers questions appropriately; utilized Jim Little [phone]?)        Exercises      General Comments        Pertinent Vitals/Pain Pain Assessment Pain Assessment: No/denies pain    Home Living                          Prior Function            PT Goals (current goals can now be found in the care plan section) Progress towards PT goals: Progressing toward goals    Frequency    Min 3X/week  PT Plan Current plan remains appropriate    Co-evaluation              AM-PAC PT "6 Clicks" Mobility   Outcome Measure  Help needed turning from your back to your side while in a flat bed without using bedrails?: A Little Help needed moving from lying on your back to sitting on the side of a flat bed without using bedrails?: A Little Help needed moving to and from a bed to a chair (including a wheelchair)?: A Little Help needed standing up from a chair using your arms (e.g., wheelchair or bedside chair)?: A Little Help needed to walk in hospital room?: A Little Help needed climbing 3-5 steps with a railing? : A Lot 6 Click Score: 17    End of Session   Activity Tolerance: Patient  tolerated treatment well Patient left: in chair;with call bell/phone within reach;with chair alarm set   PT Visit Diagnosis: Other abnormalities of gait and mobility (R26.89)     Time: 2706-2376 PT Time Calculation (min) (ACUTE ONLY): 29 min  Charges:  $Gait Training: 23-37 mins                     {Kati PT, DPT Acute Rehabilitation Services Pager: [phone] Office: [place] 05/15/2022, 4:20 PM

## 2022-05-15 NOTE — Plan of Care (Signed)

## 2022-05-15 NOTE — Progress Notes (Signed)
[facility] Liaison Note  Notified by [facility] of patient/family request of [facility] Paliative services.  [facility] hospital liaison will follow patient for discharge disposition.   Please call with any questions/concerns.    Thank you for the opportunity to participate in this patient's care.   Daphene Calamity, MSW [facility] Liaison  [phone]

## 2022-05-15 NOTE — Progress Notes (Signed)
PROGRESS NOTE    Jim Little  SFK:[id number] DOB: August 20, 1946 DOA: 05/12/2022 PCP: [name], MD     Brief Narrative:   H/o hepatocellular carcinoma (Stage IIIA, on Leenvatinib since 03/13/2022), portal vein thrombosis (originally hospitalized 12/2020, not on anticoagulation), hypertension, BPH,  brought to the ED by EMS due to confusion.  Subjective:  Architectural technologist used,  Also talked to patient's  case manager over the phone Per patient's case manager patient had foley placement twice in the recent past due to urinary retention, he follows urology Dr Claudia Desanctis  Patient reports feeling better, does not appear to have confusion today He denies pain PVR 171   Per case manager patient Suppose to start XRT for liver tomorrow Want TOC pharmacy at [place]:  Principal Problem:   Acute metabolic encephalopathy Active Problems:   Acute cystitis without hematuria   Bacterial conjunctivitis of both eyes   Essential hypertension   Drug-induced hypothyroidism   Hepatocellular carcinoma ([place])   BPH (benign prostatic hyperplasia)   GERD without esophagitis    Assessment and Plan:   * Acute metabolic encephalopathy Patient presenting with 1 week history of progressively worsening lethargy confusion and poor oral intake CT head no acute findings, Likely due to volume depletion and urinary tract infection, improving/ resolved   Acute cystitis without hematuria with h/o BPH/urinary retention -History of MDR ESBL -Blood culture no growth , urine culture positive for E. coli  -Continue current meropenem, follow-up on final urine culture result  - pvr 171, increase flomax  Bacterial conjunctivitis of both eyes  a trial of Polytrim eyedrops 4 times daily to both eyes for 7 days and monitor for any evidence of symptomatic improvement.  Patient would likely also benefit from outpatient ophthalmology evaluation  Essential hypertension BP stable on current  regimen  GERD without esophagitis Continuing home meds daily PPI therapy.   Hypothyroidism From lvenvatinib? Tsh elevated, will discuss with patient regarding increase Synthroid   Hepatocellular carcinoma ([place]) Receiving ongoing Lenvatinib therapy which will be continued here  Folic acid deficiency; start folic acid  Patient told Nurse he has a history of  Syphilis:  RPR: Non reactive He relates that was years ago  I have Reviewed nursing notes, Vitals, pain scores, I/o's, Lab results and  imaging results since pt's last encounter, details please see discussion above  I ordered the following labs:  Unresulted Labs (From admission, onward)    None        DVT prophylaxis: rivaroxaban (XARELTO) tablet 10 mg Start: 05/13/22 1000 rivaroxaban (XARELTO) tablet 10 mg   Code Status:   Code Status: Full Code  Family Communication: case Freight forwarder over the phone Disposition:    Dispo: The patient is from: home              Anticipated d/c is to: home              Anticipated d/c date is: pending urine culture   Antimicrobials:   Anti-infectives (From admission, onward)    Start     Dose/Rate Route Frequency Ordered Stop   05/13/22 2200  cefTRIAXone (ROCEPHIN) 1 g in sodium chloride 0.9 % 100 mL IVPB  Status:  Discontinued        1 g 200 mL/hr over 30 Minutes Intravenous  Once 05/13/22 0434 05/13/22 0800   05/13/22 2000  cefTRIAXone (ROCEPHIN) 1 g in sodium chloride 0.9 % 100 mL IVPB  Status:  Discontinued  1 g 200 mL/hr over 30 Minutes Intravenous Every 24 hours 05/13/22 0800 05/13/22 0930   05/13/22 1000  meropenem (MERREM) 1 g in sodium chloride 0.9 % 100 mL IVPB        1 g 200 mL/hr over 30 Minutes Intravenous Every 8 hours 05/13/22 0930     05/12/22 2000  cefTRIAXone (ROCEPHIN) 1 g in sodium chloride 0.9 % 100 mL IVPB        1 g 200 mL/hr over 30 Minutes Intravenous  Once 05/12/22 1947 05/12/22 2038           Objective: Vitals:   05/15/22 0453 05/15/22  1006 05/15/22 1315 05/15/22 2100  BP: (!) 150/77 (!) 162/78 (!) 156/82 135/77  Pulse: 65 68 73 70  Resp: '14  14 17  '$ Temp: 98.3 F (36.8 C)  99 F (37.2 C) 98.3 F (36.8 C)  TempSrc: Oral  Oral   SpO2: 99%  100% 100%  Weight:      Height:        Intake/Output Summary (Last 24 hours) at 05/15/2022 2152 Last data filed at 05/15/2022 2113 Gross per 24 hour  Intake 340 ml  Output 1850 ml  Net -1510 ml   Filed Weights   05/12/22 1614  Weight: 68.9 kg    Examination:  General exam: alert, awake, communicative,calm, NAD Respiratory system: Clear to auscultation. Respiratory effort normal. Cardiovascular system:  RRR.  Gastrointestinal system: Abdomen is nondistended, soft and nontender.  Normal bowel sounds heard. Central nervous system: Alert and oriented. No focal neurological deficits. Extremities:  no edema Skin: No rashes, lesions or ulcers Psychiatry: Judgement and insight appear normal. Mood & affect appropriate.     Data Reviewed: I have personally reviewed  labs and visualized  imaging studies since the last encounter and formulate the plan        Scheduled Meds:  amLODipine  10 mg Oral Daily   folic acid  1 mg Oral Daily   lactulose  20 g Oral BID   levothyroxine  88 mcg Oral Q0600   pantoprazole  40 mg Oral Daily   rivaroxaban  10 mg Oral Daily   [START ON 05/16/2022] tamsulosin  0.8 mg Oral Daily   trimethoprim-polymyxin b  1 drop Both Eyes QID   Continuous Infusions:  sodium chloride 75 mL/hr at 05/15/22 1524   meropenem (MERREM) IV 1 g (05/15/22 1721)     LOS: 1 day    Florencia Reasons, MD PhD FACP [facility]  Available via Epic secure chat 7am-7pm for nonurgent issues Please page for urgent issues To page the attending provider between 7A-7P or the covering provider during after hours 7P-7A, please log into the web site [email or web address] and access using universal [place] password for that web site. If you do not have the password, please call  the hospital operator.    05/15/2022, 9:52 PM

## 2022-05-16 ENCOUNTER — Other Ambulatory Visit (HOSPITAL_COMMUNITY): Payer: Self-pay

## 2022-05-16 ENCOUNTER — Other Ambulatory Visit: Payer: Self-pay

## 2022-05-16 ENCOUNTER — Ambulatory Visit: Payer: Medicaid Other | Admitting: Family Medicine

## 2022-05-16 ENCOUNTER — Ambulatory Visit
Admission: RE | Admit: 2022-05-16 | Discharge: 2022-05-16 | Disposition: A | Discharge status: Home / Self Care | Payer: Medicaid Other | Source: Ambulatory Visit | Attending: Radiation Oncology | Admitting: Radiation Oncology

## 2022-05-16 DIAGNOSIS — G9341 Metabolic encephalopathy: Secondary | ICD-10-CM | POA: Diagnosis not present

## 2022-05-16 LAB — URINE CULTURE: Culture: >=100000 — AB

## 2022-05-16 LAB — RAD ONC ARIA SESSION SUMMARY
Course Elapsed Days: 0
Plan Fractions Treated to Date: 1
Plan Prescribed Dose Per Fraction: 3 Gy
Plan Total Fractions Prescribed: 10
Plan Total Prescribed Dose: 30 Gy
Reference Point Dosage Given to Date: 3 Gy
Reference Point Session Dosage Given: 3 Gy
Session Number: 1

## 2022-05-16 MED ORDER — FOLIC ACID 1 MG PO TABS
1.0000 mg | ORAL_TABLET | Freq: Every day | ORAL | 0 refills | Status: AC
  Filled 2022-05-16: qty 30, 30d supply, fill #0

## 2022-05-16 MED ORDER — SENNOSIDES-DOCUSATE SODIUM 8.6-50 MG PO TABS
1.0000 | ORAL_TABLET | Freq: Two times a day (BID) | ORAL | Status: DC
  Administered 2022-05-16: 1 via ORAL
  Filled 2022-05-16: qty 1

## 2022-05-16 MED ORDER — FOSFOMYCIN TROMETHAMINE 3 G PO PACK
3.0000 g | PACK | Freq: Once | ORAL | Status: AC
Start: 2022-05-16 — End: 2022-05-16
  Administered 2022-05-16: 3 g via ORAL
  Filled 2022-05-16: qty 3

## 2022-05-16 NOTE — Discharge Summary (Signed)
Discharge Summary  Jim Little ZLD:[id number] DOB: January 02, 1946  PCP: [name], MD  Admit date: 05/12/2022 Discharge date: 05/16/2022  Time spent: 86mns, more than 50% time spent on coordination of care.  Recommendations for Outpatient Follow-up:  F/u with PCP within a week  for hospital discharge follow up, repeat cbc/bmp at follow up F/u with urology Dr PClaudia DesanctisF/u with oncology Dr FBurr MedicoF/u with radiation oncology Outpatient PT Community palliative care referral made    Discharge Diagnoses:  Active Hospital Problems   Diagnosis Date Noted   Acute metabolic encephalopathy 090/30/0923   Priority: 1.   Acute cystitis without hematuria 05/13/2022    Priority: 2.   Bacterial conjunctivitis of both eyes 05/13/2022    Priority: 3.   Essential hypertension 04/16/2018    Priority: 4.   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    Priority: 5.   BPH (benign prostatic hyperplasia) 05/13/2022    Priority: 6.   Hepatocellular carcinoma ([facility] 02/09/2021    Priority: 6.   GERD without esophagitis 05/13/2022    Priority: 8.    Resolved Hospital Problems  No resolved problems to display.    Discharge Condition: stable  Diet recommendation: regular diet   Filed Weights   05/12/22 1614  Weight: 68.9 kg    History of present illness: ( per admitting MD Dr SCyd Silence  76year old Swahili speaking male with past medical history of cirrhosis (of unclear etiology) with hepatocellular carcinoma (Stage IIIA, on Leenvatinib since 03/13/2022), portal vein thrombosis (originally hospitalized 12/2020, not on anticoagulation), hypertension benign [facility]  Hospital Course:  Principal Problem:   Acute metabolic encephalopathy Active Problems:   Acute cystitis without hematuria   Bacterial conjunctivitis of both eyes   Essential hypertension   Drug-induced hypothyroidism    Hepatocellular carcinoma ([facility])   BPH (benign prostatic hyperplasia)   GERD without esophagitis   Assessment and Plan:  Acute metabolic encephalopathy -Patient presenting with 1 week history of progressively worsening lethargy confusion and poor oral intake -CT head no acute findings, Likely due to volume depletion and urinary tract infection, resolved     Acute cystitis without hematuria with h/o BPH/urinary retention required temporary foley x2 in the last few months -History of MDR ESBL -Blood culture no growth , urine culture positive for ESBL E. coli  -treated with 4 days of meropenem then one does of fosfomycin   - pvr 171, he denies symptom, his case manager in the community [name] who is an RTherapist, sportson this floor states that patient has not been taking flomax, encourage medication compliance through translation  -he is to follow up with urology    Bacterial conjunctivitis of both eyes  a trial of Polytrim eyedrops 4 times daily to both eyes for 7 days, reports symptomatic improvement.   -f/u with pcp , pcp to refer to  ophthalmology if needed    Essential hypertension BP stable on current regimen   GERD without esophagitis Continuing home meds daily PPI therapy.    Hypothyroidism From lvenvatinib? Tsh elevated, case manager states he is noncompliance with Synthroid Encourage meds compliance      Hepatocellular carcinoma ([facility] Receiving ongoing Lenvatinib therapy which will be continued here  Constipation Reports not compliance with lactulose, encourage meds compliance    Folic acid deficiency; started folic acid   Patient told Nurse he has a history of  Syphilis:  RPR: Non reactive He relates that was years ago  Discharge Exam: BP (!) 148/79   Pulse 67   Temp 98.6 F (37 C)   Resp 17   Ht '5\' 6"'$  (1.676 m) Comment: per record  Wt 68.9 kg Comment: Per record  SpO2 99%   BMI 24.53 kg/m   General: NAD, pleasant  Cardiovascular: RRR Respiratory:  CTABL    Discharge Instructions     Ambulatory referral to Physical Therapy   Complete by: As directed    Diet general   Complete by: As directed    Increase activity slowly   Complete by: As directed       Allergies as of 05/16/2022       Reactions   Pork-derived Products    Religious reasons.Patient is a muslim        Medication List     STOP taking these medications    acetaminophen 500 MG tablet Commonly known as: TYLENOL   traMADol 50 MG tablet Commonly known as: ULTRAM       TAKE these medications    amLODipine 10 MG tablet Commonly known as: NORVASC Take 1 tablet (10 mg total) by mouth daily.   folic acid 1 MG tablet Commonly known as: FOLVITE Take 1 tablet by mouth daily. Start taking on: May 17, 2022   lactulose 10 g packet Commonly known as: Kristalose TAKE 1 PACKET (10 G TOTAL) BY MOUTH AS NEEDED (CONSTIPATION). DISSOLVE IN EIGHT OUNCES OF WATER   Lenvima (12 MG Daily Dose) 3 x 4 MG capsule Generic drug: lenvatinib 12 mg daily dose Take 12 mg by mouth daily. Take as instructed by MD   levothyroxine 88 MCG tablet Commonly known as: SYNTHROID Take 1 tablet (88 mcg total) by mouth every morning. 30 minutes before food   omeprazole 40 MG capsule Commonly known as: PRILOSEC Take 1 capsule (40 mg total) by mouth daily.   Pazeo 0.7 % Soln Generic drug: Olopatadine HCl Use once daily in both eyes   tamsulosin 0.4 MG Caps capsule Commonly known as: FLOMAX Take 1 capsule (0.4 mg total) by mouth daily. Please pack and mail to home               Deylan  (From admission, onward)           Start     Ordered   05/14/22 1046  For home use only DME Walker rolling  Once       Question Answer Comment  Walker: With 5 Inch Wheels   Patient needs a walker to treat with the following condition Balance disorder      05/14/22 1046           Allergies  Allergen Reactions   Pork-Derived Products     Religious  reasons.Patient is a muslim    Follow-up Information     [name], MD Follow up on 05/23/2022.   Specialty: Family Medicine Why: hospital discharge follow up, pcp to refer to opthamolgist if eyes continue to have symptoms Contact information: [address] [address] [phone]         [facility] Follow up in 1 week(s).   Why: for bladder retention and recurrent uti Contact information: [facility] Follow up.   Specialty: Physical Medicine and Rehabilitation Why: Will call you with appointment for outpatient physical therapy Contact information: [address], [address]  [place] [phone]                 The results of significant diagnostics from this hospitalization (including imaging, microbiology, ancillary and laboratory) are listed below for reference.    Significant Diagnostic Studies: CT HEAD WO CONTRAST (5MM)  Result Date: 05/12/2022 CLINICAL DATA:  Headache EXAM: CT HEAD WITHOUT CONTRAST TECHNIQUE: Contiguous axial images were obtained from the base of the skull through the vertex without intravenous contrast. RADIATION DOSE REDUCTION: This exam was performed according to the departmental dose-optimization program which includes automated exposure control, adjustment of the mA and/or kV according to patient size and/or use of iterative reconstruction technique. COMPARISON:  05/11/2020 FINDINGS: Brain: No evidence of acute infarction, hemorrhage, hydrocephalus, extra-axial collection or mass lesion/mass effect. Moderate low-density changes within the periventricular and subcortical white matter compatible with chronic microvascular ischemic change. Mild diffuse cerebral volume loss. Vascular: No hyperdense vessel or unexpected calcification. Skull: Normal. Negative  for fracture or focal lesion. Sinuses/Orbits: Unchanged left frontal sinus osteoma. Mucosal thickening within the ethmoid air cells. Other: None. IMPRESSION: 1. No acute intracranial abnormality. 2. Chronic microvascular ischemic change and cerebral volume loss. Electronically Signed   By: Davina Poke D.O.   On: 05/12/2022 17:11   DG Chest Portable 1 View  Result Date: 05/12/2022 CLINICAL DATA:  Syncope.  History of hepatocellular carcinoma EXAM: PORTABLE CHEST 1 VIEW COMPARISON:  CT chest 12/28/21 FINDINGS: Stable cardiomediastinal contours. The lung volumes are low and there is mild atelectasis identified in the left lung base. No signs of pleural effusion or edema. No airspace opacities. Calcified granuloma is noted within the right lower lobe. Visualized osseous structures are unremarkable. Both lungs are clear. The visualized skeletal structures are unremarkable. IMPRESSION: Low lung volumes and left base atelectasis. Electronically Signed   By: Kerby Moors M.D.   On: 05/12/2022 17:00    Microbiology: Recent Results (from the past 240 hour(s))  Urine Culture     Status: Abnormal   Collection Time: 05/12/22  6:44 PM   Specimen: Urine, Clean Catch  Result Value Ref Range Status   Specimen Description   Final    URINE, CLEAN CATCH Performed at [facility], [place] [address], [place], [place] [address]    Special Requests   Final    NONE Performed at [facility], [place] [address], [place], [place] [address]    Culture (A)  Final    >=100,000 COLONIES/mL ESCHERICHIA COLI Confirmed Extended Spectrum Beta-Lactamase Producer (ESBL).  In bloodstream infections from ESBL organisms, carbapenems are preferred over piperacillin/tazobactam. They are shown to have a lower risk of mortality. MULTI-DRUG RESISTANT ORGANISM CRITICAL RESULT CALLED TO, READ BACK BY AND VERIFIED WITH: Lorelee Cover [phone] FCP Performed at [facility], [address], [place], [place] [address]    Report Status 05/16/2022 FINAL  Final   Organism ID, Bacteria ESCHERICHIA COLI (A)  Final      Susceptibility   Escherichia coli - MIC*    AMPICILLIN >=32 RESISTANT Resistant     CEFAZOLIN >=64 RESISTANT Resistant     CEFEPIME 16 RESISTANT Resistant     CEFTRIAXONE >=64 RESISTANT Resistant     CIPROFLOXACIN >=4 RESISTANT Resistant     GENTAMICIN >=16 RESISTANT Resistant     IMIPENEM <=0.25 SENSITIVE Sensitive     NITROFURANTOIN <=16 SENSITIVE Sensitive     TRIMETH/SULFA >=320 RESISTANT Resistant     AMPICILLIN/SULBACTAM >=32 RESISTANT Resistant  PIP/TAZO 16 SENSITIVE Sensitive     * >=100,000 COLONIES/mL ESCHERICHIA COLI  Culture, blood (Routine X 2) w Reflex to ID Panel     Status: None (Preliminary result)   Collection Time: 05/13/22  5:02 AM   Specimen: BLOOD  Result Value Ref Range Status   Specimen Description   Final    BLOOD RIGHT ANTECUBITAL Performed at [facility] [place] [address], [place], [place] [address]    Special Requests   Final    BOTTLES DRAWN AEROBIC ONLY Blood Culture adequate volume Performed at [facility] [address] [place], [place] [address]    Culture   Final    NO GROWTH 3 DAYS Performed at [facility], [place] [address], [place], [place] [address]    Report Status PENDING  Incomplete  Culture, blood (Routine X 2) w Reflex to ID Panel     Status: None (Preliminary result)   Collection Time: 05/13/22  5:03 AM   Specimen: BLOOD RIGHT HAND  Result Value Ref Range Status   Specimen Description   Final    BLOOD RIGHT HAND Performed at [facility], [place] [address] [place], [place] [address]    Special Requests   Final    BOTTLES DRAWN AEROBIC ONLY Blood Culture adequate volume Performed at [facility] [address] [place], [place] [address]    Culture   Final    NO GROWTH 3 DAYS Performed at [facility], [place] [address],  [place],  [address]    Report Status PENDING  Incomplete     Labs: Basic Metabolic Panel: Recent Labs  Lab 05/12/22 1628 05/13/22 0444 05/14/22 0904 05/15/22 0636  NA 135 140 135 140  K 3.9 4.1 3.8 3.9  CL 106 109 109 109  CO2 '23 25 22 26  '$ GLUCOSE 139* 113* 120* 105*  BUN '11 11 11 9  '$ CREATININE 0.89 0.79 0.87 0.75  CALCIUM 8.5* 8.7* 8.4* 8.6*  MG 2.1 2.1  --   --    Liver Function Tests: Recent Labs  Lab 05/12/22 1628 05/13/22 0444  AST 54* 56*  ALT 25 22  ALKPHOS 115 112  BILITOT 0.8 0.8  PROT 7.9 7.4  ALBUMIN 3.3* 3.1*   Recent Labs  Lab 05/12/22 1628  LIPASE 32   Recent Labs  Lab 05/12/22 1628  AMMONIA 42*   CBC: Recent Labs  Lab 05/12/22 1628 05/13/22 0444 05/14/22 0715 05/15/22 0636  WBC 4.5 4.5 4.5 4.3  NEUTROABS 3.0 2.8  --   --   HGB 12.9* 12.3* 11.8* 11.3*  HCT 39.9 37.6* 33.8* 34.4*  MCV 106.4* 105.6* 102.7* 105.2*  PLT 134* 127* 98* 113*   Cardiac Enzymes: Recent Labs  Lab 05/12/22 1628  CKTOTAL 96   BNP: BNP (last 3 results) No results for input(s): "BNP" in the last 8760 hours.  ProBNP (last 3 results) No results for input(s): "PROBNP" in the last 8760 hours.  CBG: No results for input(s): "GLUCAP" in the last 168 hours.  FURTHER DISCHARGE INSTRUCTIONS:   Get Medicines reviewed and adjusted: Please take all your medications with you for your next visit with your Primary MD   Laboratory/radiological data: Please request your Primary MD to go over all hospital tests and procedure/radiological results at the follow up, please ask your Primary MD to get all Hospital records sent to his/her office.   In some cases, they will be blood work, cultures and biopsy  results pending at the time of your discharge. Please request that your primary care M.D. goes through all the records of your hospital data and follows up on these results.   Also Note the following: If you experience worsening of your admission symptoms, develop  shortness of breath, life threatening emergency, suicidal or homicidal thoughts you must seek medical attention immediately by calling 911 or calling your MD immediately  if symptoms less severe.   You must read complete instructions/literature along with all the possible adverse reactions/side effects for all the Medicines you take and that have been prescribed to you. Take any new Medicines after you have completely understood and accpet all the possible adverse reactions/side effects.    Do not drive when taking Pain medications or sleeping medications (Benzodaizepines)   Do not take more than prescribed Pain, Sleep and Anxiety Medications. It is not advisable to combine anxiety,sleep and pain medications without talking with your primary care practitioner   Special Instructions: If you have smoked or chewed Tobacco  in the last 2 yrs please stop smoking, stop any regular Alcohol  and or any Recreational drug use.   Wear Seat belts while driving.   Please note: You were cared for by a hospitalist during your hospital stay. Once you are discharged, your primary care physician will handle any further medical issues. Please note that NO REFILLS for any discharge medications will be authorized once you are discharged, as it is imperative that you return to your primary care physician (or establish a relationship with a primary care physician if you do not have one) for your post hospital discharge needs so that they can reassess your need for medications and monitor your lab values.     Signed:  Florencia Reasons MD, PhD, FACP  [facility] 05/16/2022, 3:32 PM

## 2022-05-16 NOTE — Plan of Care (Signed)
°  Problem: Clinical Measurements: °Goal: Ability to maintain clinical measurements within normal limits will improve °Outcome: Progressing °  °Problem: Activity: °Goal: Risk for activity intolerance will decrease °Outcome: Progressing °  °Problem: Nutrition: °Goal: Adequate nutrition will be maintained °Outcome: Progressing °  °

## 2022-05-17 ENCOUNTER — Inpatient Hospital Stay: Payer: Medicaid Other

## 2022-05-17 ENCOUNTER — Ambulatory Visit
Admission: RE | Admit: 2022-05-17 | Discharge: 2022-05-17 | Disposition: A | Discharge status: Home / Self Care | Payer: Medicaid Other | Source: Ambulatory Visit | Attending: Radiation Oncology | Admitting: Radiation Oncology

## 2022-05-17 ENCOUNTER — Other Ambulatory Visit: Payer: Self-pay

## 2022-05-17 DIAGNOSIS — Z510 Encounter for antineoplastic radiation therapy: Secondary | ICD-10-CM | POA: Diagnosis present

## 2022-05-17 DIAGNOSIS — K5900 Constipation, unspecified: Secondary | ICD-10-CM | POA: Diagnosis not present

## 2022-05-17 DIAGNOSIS — K7460 Unspecified cirrhosis of liver: Secondary | ICD-10-CM | POA: Diagnosis not present

## 2022-05-17 DIAGNOSIS — C220 Liver cell carcinoma: Secondary | ICD-10-CM | POA: Diagnosis not present

## 2022-05-17 DIAGNOSIS — C778 Secondary and unspecified malignant neoplasm of lymph nodes of multiple regions: Secondary | ICD-10-CM | POA: Diagnosis not present

## 2022-05-17 DIAGNOSIS — I10 Essential (primary) hypertension: Secondary | ICD-10-CM | POA: Diagnosis not present

## 2022-05-17 DIAGNOSIS — Z87440 Personal history of urinary (tract) infections: Secondary | ICD-10-CM | POA: Diagnosis not present

## 2022-05-17 DIAGNOSIS — I81 Portal vein thrombosis: Secondary | ICD-10-CM | POA: Diagnosis not present

## 2022-05-17 LAB — RAD ONC ARIA SESSION SUMMARY
Course Elapsed Days: 1
Plan Fractions Treated to Date: 2
Plan Prescribed Dose Per Fraction: 3 Gy
Plan Total Fractions Prescribed: 10
Plan Total Prescribed Dose: 30 Gy
Reference Point Dosage Given to Date: 6 Gy
Reference Point Session Dosage Given: 3 Gy
Session Number: 2

## 2022-05-18 LAB — CULTURE, BLOOD (ROUTINE X 2)
Culture: NO GROWTH
Culture: NO GROWTH
Special Requests: ADEQUATE
Special Requests: ADEQUATE

## 2022-05-20 ENCOUNTER — Telehealth: Payer: Self-pay

## 2022-05-20 ENCOUNTER — Other Ambulatory Visit: Payer: Self-pay

## 2022-05-20 ENCOUNTER — Ambulatory Visit
Admission: RE | Admit: 2022-05-20 | Discharge: 2022-05-20 | Disposition: A | Discharge status: Home / Self Care | Payer: Medicaid Other | Source: Ambulatory Visit | Attending: Radiation Oncology | Admitting: Radiation Oncology

## 2022-05-20 DIAGNOSIS — Z510 Encounter for antineoplastic radiation therapy: Secondary | ICD-10-CM | POA: Diagnosis not present

## 2022-05-20 LAB — RAD ONC ARIA SESSION SUMMARY
Course Elapsed Days: 4
Plan Fractions Treated to Date: 3
Plan Prescribed Dose Per Fraction: 3 Gy
Plan Total Fractions Prescribed: 10
Plan Total Prescribed Dose: 30 Gy
Reference Point Dosage Given to Date: 9 Gy
Reference Point Session Dosage Given: 3 Gy
Session Number: 3

## 2022-05-20 NOTE — Telephone Encounter (Signed)
Transition Care Management Follow-up Telephone Call Date of discharge and from where: [facility] 05/12/22-05/16/22 How have you been since you were released from the hospital? Per Nicole Cella, interpretor, Patient had no complaints.  He has restarted radiation. Any questions or concerns? No  Items Reviewed: Did the pt receive and understand the discharge instructions provided? Yes  Medications obtained and verified? Yes - Patient can be non-compliant with medications.  When he gets a new medication, he stops all the old ones. Other? No  Any new allergies since your discharge? No  Dietary orders reviewed? Yes Do you have support at home? Yes -Spouse and children  Home Care and Equipment/Supplies: Were home health services ordered? no If so, what is the name of the agency? N/A  Has the agency set up a time to come to the patient's home? not applicable Were any new equipment or medical supplies ordered?  No What is the name of the medical supply agency? NA Were you able to get the supplies/equipment? not applicable Do you have any questions related to the use of the equipment or supplies? No  Functional Questionnaire: (I = Independent and D = Dependent) ADLs: I Bathing/Dressing- I  Meal Prep- D-spouse cooks  Eating- I  Maintaining continence- I (some bladder incontinence noted)  Transferring/Ambulation- I  Managing Meds- I  Follow up appointments reviewed:  PCP Hospital f/u appt confirmed? Yes  Scheduled to see Dr. Manson Passey on 05/23/22 @ 0930. Specialist Hospital f/u appt confirmed? Yes  Scheduled to see Radiation oncology on 05/20/22 @ 11:40.. Are transportation arrangements needed? No  If their condition worsens, is the pt aware to call PCP or go to the Emergency Dept.? Yes Was the patient provided with contact information for the PCP's office or ED? Yes Was to pt encouraged to call back with questions or concerns? Yes Jodelle Gross, RN, BSN, CCM Care Management  Coordinator Phone: [phone]/Fax: [phone]

## 2022-05-21 ENCOUNTER — Inpatient Hospital Stay
Admission: RE | Admit: 2022-05-21 | Discharge: 2022-05-21 | Disposition: A | Discharge status: Home / Self Care | Payer: Self-pay | Source: Ambulatory Visit | Attending: Radiation Oncology | Admitting: Radiation Oncology

## 2022-05-21 ENCOUNTER — Other Ambulatory Visit: Payer: Self-pay

## 2022-05-21 ENCOUNTER — Telehealth: Payer: Self-pay

## 2022-05-21 DIAGNOSIS — Z510 Encounter for antineoplastic radiation therapy: Secondary | ICD-10-CM | POA: Diagnosis not present

## 2022-05-21 LAB — RAD ONC ARIA SESSION SUMMARY
Course Elapsed Days: 5
Plan Fractions Treated to Date: 4
Plan Prescribed Dose Per Fraction: 3 Gy
Plan Total Fractions Prescribed: 10
Plan Total Prescribed Dose: 30 Gy
Reference Point Dosage Given to Date: 12 Gy
Reference Point Session Dosage Given: 3 Gy
Session Number: 4

## 2022-05-22 ENCOUNTER — Other Ambulatory Visit (HOSPITAL_COMMUNITY): Payer: Self-pay

## 2022-05-22 ENCOUNTER — Encounter: Payer: Self-pay | Admitting: Hematology

## 2022-05-22 ENCOUNTER — Inpatient Hospital Stay: Payer: Medicaid Other

## 2022-05-22 ENCOUNTER — Other Ambulatory Visit: Payer: Self-pay

## 2022-05-22 ENCOUNTER — Inpatient Hospital Stay (HOSPITAL_BASED_OUTPATIENT_CLINIC_OR_DEPARTMENT_OTHER): Payer: Medicaid Other | Admitting: Hematology

## 2022-05-22 ENCOUNTER — Telehealth: Payer: Self-pay

## 2022-05-22 ENCOUNTER — Ambulatory Visit
Admission: RE | Admit: 2022-05-22 | Discharge: 2022-05-22 | Disposition: A | Discharge status: Home / Self Care | Payer: Medicaid Other | Source: Ambulatory Visit | Attending: Radiation Oncology | Admitting: Radiation Oncology

## 2022-05-22 VITALS — BP 125/72 | HR 79 | Temp 98.2°F | Resp 18 | Ht 66.0 in | Wt 150.4 lb

## 2022-05-22 DIAGNOSIS — C220 Liver cell carcinoma: Secondary | ICD-10-CM | POA: Diagnosis not present

## 2022-05-22 DIAGNOSIS — Z510 Encounter for antineoplastic radiation therapy: Secondary | ICD-10-CM | POA: Diagnosis not present

## 2022-05-22 LAB — CBC WITH DIFFERENTIAL (CANCER CENTER ONLY)
Abs Immature Granulocytes: 0.01 10*3/uL (ref 0.00–0.07)
Basophils Absolute: 0 10*3/uL (ref 0.0–0.1)
Basophils Relative: 0 %
Eosinophils Absolute: 0.2 10*3/uL (ref 0.0–0.5)
Eosinophils Relative: 6 %
HCT: 38 % — ABNORMAL LOW (ref 39.0–52.0)
Hemoglobin: 13.1 g/dL (ref 13.0–17.0)
Immature Granulocytes: 0 %
Lymphocytes Relative: 21 %
Lymphs Abs: 0.6 10*3/uL — ABNORMAL LOW (ref 0.7–4.0)
MCH: 35.2 pg — ABNORMAL HIGH (ref 26.0–34.0)
MCHC: 34.5 g/dL (ref 30.0–36.0)
MCV: 102.2 fL — ABNORMAL HIGH (ref 80.0–100.0)
Monocytes Absolute: 0.4 10*3/uL (ref 0.1–1.0)
Monocytes Relative: 15 %
Neutro Abs: 1.6 10*3/uL — ABNORMAL LOW (ref 1.7–7.7)
Neutrophils Relative %: 58 %
Platelet Count: 151 10*3/uL (ref 150–400)
RBC: 3.72 MIL/uL — ABNORMAL LOW (ref 4.22–5.81)
RDW: 13.9 % (ref 11.5–15.5)
WBC Count: 2.7 10*3/uL — ABNORMAL LOW (ref 4.0–10.5)
nRBC: 0 % (ref 0.0–0.2)

## 2022-05-22 LAB — RAD ONC ARIA SESSION SUMMARY
Course Elapsed Days: 6
Plan Fractions Treated to Date: 5
Plan Prescribed Dose Per Fraction: 3 Gy
Plan Total Fractions Prescribed: 10
Plan Total Prescribed Dose: 30 Gy
Reference Point Dosage Given to Date: 15 Gy
Reference Point Session Dosage Given: 3 Gy
Session Number: 5

## 2022-05-22 LAB — CMP (CANCER CENTER ONLY)
ALT: 48 U/L — ABNORMAL HIGH (ref 0–44)
AST: 79 U/L — ABNORMAL HIGH (ref 15–41)
Albumin: 3.9 g/dL (ref 3.5–5.0)
Alkaline Phosphatase: 164 U/L — ABNORMAL HIGH (ref 38–126)
Anion gap: 6 (ref 5–15)
BUN: 9 mg/dL (ref 8–23)
CO2: 25 mmol/L (ref 22–32)
Calcium: 9.5 mg/dL (ref 8.9–10.3)
Chloride: 104 mmol/L (ref 98–111)
Creatinine: 0.62 mg/dL (ref 0.61–1.24)
GFR, Estimated: >60 mL/min (ref >60)
Glucose, Bld: 113 mg/dL — ABNORMAL HIGH (ref 70–99)
Potassium: 4.1 mmol/L (ref 3.5–5.1)
Sodium: 135 mmol/L (ref 135–145)
Total Bilirubin: 0.8 mg/dL (ref 0.3–1.2)
Total Protein: 8.8 g/dL — ABNORMAL HIGH (ref 6.5–8.1)

## 2022-05-22 MED ORDER — LENVIMA (12 MG DAILY DOSE) 3 X 4 MG PO CPPK
12.0000 mg | ORAL_CAPSULE | Freq: Every day | ORAL | 1 refills | Status: DC
  Filled 2022-05-22: qty 90, 90d supply, fill #0
  Filled 2022-06-03: qty 90, 30d supply, fill #0
  Filled 2022-07-16: qty 90, 30d supply, fill #1

## 2022-05-22 NOTE — Telephone Encounter (Signed)
Patient called with appointment reminder to see PCP on 05/23/22. Ride scheduled.  [name] Jahmire Ruffins RN BSN [place] Nurse [phone]

## 2022-05-22 NOTE — Progress Notes (Signed)
[facility]   Telephone:([phone]) [phone] [phone]   Clinic Follow up Note   Patient Care Team: [name], MD as PCP - General (Family Medicine) Jonnie Finner, RN (Inactive) as Oncology Nurse Navigator Heilingoetter, Tobe Sos, PA-C as Physician Assistant (Physician Assistant) Truitt Merle, MD as Consulting Physician (Oncology)  Date of Service:  05/22/2022  CHIEF COMPLAINT: f/u of [place]  CURRENT THERAPY:  -Lenvatinib 12 mg daily, starting 03/13/22 -palliative radiation to involved nodes, 6/22-05/30/22  ASSESSMENT & PLAN:  Jim Little is a 76 y.o. male with   1. Hepatocellular Carcinoma, unresectable, stage IIIA, with node metastasis  -Based on MRI liver 01/12/21 with Advanced cirrhotic changes involving the liver and Large infiltrating mass versus numerous confluent masses involving the right hepatic lobe. This is consistent with hepatocellular carcinoma. Associated thrombosed middle and portal veins. No definite left hepatic lobe lesions. AFP (01/14/21) 11,904.0. -His 01/15/21 CT of the chest did not show metastatic disease to the lungs. Staging workup completed. -He is s/p Y 90 radioembolization on 03/08/21. His AFP has dropped significantly after Y90 -MRI scan from 05/29/21 showed good response in liver, but he developed hepatoduodenal ligament lymph node measuring 2.0 x 1.7 cm, likely metastatic disease -He started Tecentriq and Avastin on 06/29/21. He  tolerated relatively well, with constipation. -due to rising AFP, CT AP obtained on 03/06/22 showed: unchanged right liver mass; significant enlargement of porta hepatis node; unchanged smaller RP nodes. -The patient started lenvatinib on ~03/13/22.  He states he is on '12mg'$  daily now and tolerating well  -He was recently hospitalized for UTI and metabolic encephalopathy, resolved now.  We will continue Lenvatinib  -labs reviewed, liver enzymes slightly elevated more than previously-- AST 79 and ALT 48-- and WBC 2.7.  Plan to continue lenvatinib with close monitoring. -He is currently on radiation to the metastatic portocaval lymph node, he is tolerating radiation well. -Plan to repeat CT scan in 2 to 3 months, will continue monitoring AFP.   2. Recurrent UTI, h/o of urinary retention -UA has been positive on 01/03/22, 01/23/22, 03/29/22 (at [facility]), and 05/12/22.   3. Social Support, Language barrier -From the [place], was at a refugee camp in [place] -Has several family members living in single house. He is the primary caretaker for his 53 year old granddaughter with cerebral palsy. Neither he nor his family speak [place]  -He speaks Swahili. He has [employer] [employer], Honor Loh, who helps him. Her contact is [phone] -he tells me today his wife(?) is planning on getting a job, which would leave his granddaughter without a caretaker.   4. Portal vein thrombosis, Cirrhosis  -Seen with direct admission from 01/13/21-01/16/21 -Patient received heparin drip inpatient. Dr. Marin Olp did not recommend anticoagulation since the thrombus is tumor thrombus. -He has unclear etiology of cirrhosis, which is followed by Dr. Therisa Doyne. Hep B surface antigen non reactive (05/15/20), ANA negative, Hep C ab non reactive. No alcohol abuse. Prior work up with EGD/Colonoscopy in 07/2020 which showed no varices      PLAN: -Continue lenvatinib 12 mg daily, refilled today  -continue daily radiation through 05/30/22 -lab and f/u in 4 weeks   No problem-specific Assessment & Plan notes found for this encounter.   SUMMARY OF ONCOLOGIC HISTORY: Oncology History Overview Note  Cancer Staging Hepatocellular carcinoma [facility]) Staging form: Liver, AJCC 8th Edition - Clinical stage from 02/09/2021: Stage IIIA (cT3, cN0, cM0) - Signed by Truitt Merle, MD on 02/09/2021 Stage prefix: Initial  diagnosis    Hepatocellular carcinoma ([place])  05/12/2020 Imaging   MRI Liver  IMPRESSION: 1. Geographic lesion in  the inferior medial aspect of the RIGHT hepatic lobe has some imaging features suggesting focal fatty infiltration. However, atypical enhancement pattern. Recommend follow-up contrast MRI in 3 to 6 months to re-evaluate. 2. No additional lesion liver. 3. Morphologic changes consistent with cirrhosis.   01/12/2021 Imaging   MRI Abdomen  IMPRESSION: 1. Advanced cirrhotic changes involving the liver. There is a large infiltrating mass versus numerous confluent masses involving the right hepatic lobe and consistent with hepatocellular carcinoma. Associated thrombosed middle and portal veins. No definite left hepatic lobe lesions. LI-RADS 5. 2. Borderline splenomegaly. 3. No ascites or abdominal wall hernia.     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 The ultrasound correlate of the previously demonstrated right liver tumor is heterogeneous tissue with ill-defined margin.   In addition to the right-sided tumor, there are several small hyperechoic foci of approximately 1 cm, potentially additional [place] foci versus regenerative nodules.   Cirrhosis and splenomegaly   01/15/2021 Imaging   CT Chest  IMPRESSION: 1. No evidence of metastatic disease in the chest. 2. Cirrhotic morphology of the liver with ill-defined, heterogeneous mass of the right lobe of the liver and occlusion of the right portal vein, in keeping with known hepatocellular carcinoma, better demonstrated by recent MR.   02/09/2021 Initial Diagnosis   Hepatocellular carcinoma ([place])   02/09/2021 Cancer Staging   Staging form: Liver, AJCC 8th Edition - Clinical stage from 02/09/2021: Stage IIIA (cT3, cN0, cM0) - Signed by Truitt Merle, MD on 02/09/2021 Stage prefix: Initial diagnosis   05/29/2021 Imaging   MRI Abdomen  IMPRESSION: 1.  Interval decrease in size of the infiltrating mass in the right hepatic lobe showing heterogeneous arterial phase hyperenhancement. Persistent arterial phase enhancement can be seen early post y 70 embolization. No substantial restricted diffusion in the lesion on today's study. 2. Tiny focus of arterial phase hyperenhancement in the dome of the liver near the junction of segment IV and VIII. Unclear whether this was present previously given the marked motion degradation on the earlier study. Close attention on follow-up recommended. 3. 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 Subtraction postcontrast imaging suggests that there may be some enhancement in the thrombus suggesting tumor thrombus or a combination of tumor and bland thrombus. 4. Interval increase in size of the hepatoduodenal ligament lymph node measuring 2.0 x 1.7 cm. Likely metastatic disease,  continued close attention on follow-up recommended. 5. Markedly distended urinary bladder.   06/29/2021 -  Chemotherapy   Patient is on Treatment Plan : LUNG Atezolizumab + Bevacizumab q21d Maintenance     11/28/2021 Imaging   EXAM: CT ABDOMEN AND PELVIS WITH CONTRAST  IMPRESSION: 1. Cirrhotic morphology of the liver with redemonstrated post procedural findings of right hepatic lobe embolization resulting in substantial atrophy of the right lobe of the liver. 2. Generally unchanged appearance of a hypoenhancing mass in the peripheral right lobe following embolization, without convincing evidence of residual or recurrent contrast enhancement. 3. No new suspicious contrast enhancement in the liver. 4. No significant change in an enlarged hepatoduodenal or portacaval lymph node  measuring 3.5 x 2.5 cm, likely a nodal metastasis. 5. Additional smaller, nonspecific porta hepatis and left retroperitoneal  lymph nodes. Attention on follow-up. 6. Prostatomegaly with thickening of the urinary bladder wall and hyperenhancement of the mucosa. Correlate for urinalysis evidence of infectious or inflammatory cystitis   Aortic Atherosclerosis (ICD10-I70.0).   03/06/2022 Imaging   EXAM: CT ABDOMEN AND PELVIS WITH CONTRAST  IMPRESSION: 1. Cirrhotic morphology of the liver. Unchanged appearance of a hypoenhancing mass of the posterior right lobe of the liver without arterial hyperenhancement following radioembolization. The right portal vein remains occluded. 2. No new arterial hyperenhancement in the liver. 3. Significant interval enlargement of a bulky porta hepatis node, measuring 5.4 x 3.8 cm, previously 3.5 x 2.5 cm, consistent with worsened nodal metastatic disease. Unchanged smaller porta hepatis and left retroperitoneal lymph nodes, which remain nonspecific although suspicious for metastases. 4. Thickening and hyperenhancement of the urinary bladder, again suggestive of nonspecific infectious or inflammatory cystitis, although possibly related to chronic outlet obstruction. Correlate with urinalysis if clinically referable signs and symptoms are present.      INTERVAL HISTORY:  Jim Little is here for a follow up of [place]. He was last seen by PA Cassie on 04/02/22. He presents to the clinic accompanied by an interpreter. He tells me he is unsure why he was in the hospital recently. He admits he is not taking the lenvatinib daily as prescribed.   All other systems were reviewed with the patient and are negative.  MEDICAL HISTORY:  Past Medical History:  Diagnosis Date   BPH (benign prostatic hyperplasia)    Cirrhosis ([place])    Hypertension    Splenomegaly     SURGICAL HISTORY: Past Surgical History:  Procedure Laterality Date   BIOPSY  08/22/2020   Procedure: BIOPSY;  Surgeon: Ronnette Juniper, MD;  Location: [facility];  Service: Gastroenterology;;   COLONOSCOPY WITH PROPOFOL N/A  08/22/2020   Procedure: COLONOSCOPY WITH PROPOFOL;  Surgeon: Ronnette Juniper, MD;  Location: [facility];  Service: Gastroenterology;  Laterality: N/A;   ESOPHAGOGASTRODUODENOSCOPY (EGD) WITH PROPOFOL N/A 08/22/2020   Procedure: ESOPHAGOGASTRODUODENOSCOPY (EGD) WITH PROPOFOL;  Surgeon: Ronnette Juniper, MD;  Location: [facility];  Service: Gastroenterology;  Laterality: N/A;   IR ANGIOGRAM SELECTIVE EACH ADDITIONAL VESSEL  03/01/2021   IR ANGIOGRAM SELECTIVE EACH ADDITIONAL VESSEL  03/08/2021   IR ANGIOGRAM VISCERAL SELECTIVE  03/01/2021   IR ANGIOGRAM VISCERAL SELECTIVE  03/01/2021   IR ANGIOGRAM VISCERAL SELECTIVE  03/08/2021   IR EMBO TUMOR ORGAN ISCHEMIA INFARCT INC GUIDE ROADMAPPING  03/08/2021   IR RADIOLOGIST EVAL & MGMT  02/06/2021   IR RADIOLOGIST EVAL & MGMT  05/31/2021   IR US GUIDE VASC ACCESS RIGHT  03/01/2021   IR US GUIDE VASC ACCESS RIGHT  03/08/2021   UPPER GASTROINTESTINAL ENDOSCOPY  03/2018   [place] --candidiasis plus esophageal varices grade 2.  Treated with fluconazole and status post esophageal banding.   US ECHOCARDIOGRAPHY  10/2016   per overseas records from [place] - LVEF 68%; mildly calcified aortic valve, normal function otherwise    I have reviewed the social history and family history with the patient and they are unchanged from previous note.  ALLERGIES:  is allergic to pork-derived products.  MEDICATIONS:  Current Outpatient Medications  Medication Sig Dispense Refill   amLODipine (NORVASC) 10 MG tablet Take 1 tablet (10 mg total) by mouth daily. 90 tablet 3   folic acid (FOLVITE) 1 MG tablet Take 1 tablet by mouth daily. 30 tablet 0  lactulose (KRISTALOSE) 10 g packet TAKE 1 PACKET (10 G TOTAL) BY MOUTH AS NEEDED (CONSTIPATION). DISSOLVE IN EIGHT OUNCES OF WATER (Patient not taking: Reported on 04/24/2022) 30 each 1   lenvatinib 12 mg daily dose (LENVIMA, 12 MG DAILY DOSE,) 3 x 4 MG capsule Take 12 mg by mouth daily. Take as instructed by MD 90 capsule 1    levothyroxine (SYNTHROID) 88 MCG tablet Take 1 tablet (88 mcg total) by mouth every morning. 30 minutes before food 60 tablet 1   Olopatadine HCl (PAZEO) 0.7 % SOLN Use once daily in both eyes (Patient not taking: Reported on 04/24/2022) 2.5 mL 0   omeprazole (PRILOSEC) 40 MG capsule Take 1 capsule (40 mg total) by mouth daily. (Patient not taking: Reported on 05/12/2022) 90 capsule 3   tamsulosin (FLOMAX) 0.4 MG CAPS capsule Take 1 capsule (0.4 mg total) by mouth daily. Please pack and mail to home (Patient not taking: Reported on 05/12/2022) 90 capsule 3   No current facility-administered medications for this visit.   Facility-Administered Medications Ordered in Other Visits  Medication Dose Route Frequency Provider Last Rate Last Admin   sodium chloride flush (NS) 0.9 % injection 10 mL  10 mL Intracatheter PRN Truitt Merle, MD   10 mL at 09/19/21 1604    PHYSICAL EXAMINATION: ECOG PERFORMANCE STATUS: 2 - Symptomatic, <50% confined to bed  Vitals:   05/22/22 1017  BP: 125/72  Pulse: 79  Resp: 18  Temp: 98.2 F (36.8 C)  SpO2: 99%   Wt Readings from Last 3 Encounters:  05/22/22 150 lb 6.4 oz (68.2 kg)  05/12/22 152 lb (68.9 kg)  04/24/22 152 lb 11.2 oz (69.3 kg)     GENERAL:alert, no distress and comfortable SKIN: skin color normal, no rashes or significant lesions EYES: normal, Conjunctiva are pink and non-injected, sclera clear  NEURO: alert & oriented x 3 with fluent speech  LABORATORY DATA:  I have reviewed the data as listed    Latest Ref Rng & Units 05/22/2022    9:50 AM 05/15/2022    6:36 AM 05/14/2022    7:15 AM  CBC  WBC 4.0 - 10.5 K/uL 2.7  4.3  4.5   Hemoglobin 13.0 - 17.0 g/dL 13.1  11.3  11.8   Hematocrit 39.0 - 52.0 % 38.0  34.4  33.8   Platelets 150 - 400 K/uL 151  113  98         Latest Ref Rng & Units 05/22/2022    9:50 AM 05/15/2022    6:36 AM 05/14/2022    9:04 AM  CMP  Glucose 70 - 99 mg/dL 113  105  120   BUN 8 - 23 mg/dL '9  9  11   '$ Creatinine 0.61  - 1.24 mg/dL 0.62  0.75  0.87   Sodium 135 - 145 mmol/L 135  140  135   Potassium 3.5 - 5.1 mmol/L 4.1  3.9  3.8   Chloride 98 - 111 mmol/L 104  109  109   CO2 22 - 32 mmol/L '25  26  22   '$ Calcium 8.9 - 10.3 mg/dL 9.5  8.6  8.4   Total Protein 6.5 - 8.1 g/dL 8.8     Total Bilirubin 0.3 - 1.2 mg/dL 0.8     Alkaline Phos 38 - 126 U/L 164     AST 15 - 41 U/L 79     ALT 0 - 44 U/L 48  RADIOGRAPHIC STUDIES: I have personally reviewed the radiological images as listed and agreed with the findings in the report. No results found.    No orders of the defined types were placed in this encounter.  All questions were answered. The patient knows to call the clinic with any problems, questions or concerns. No barriers to learning was detected. The total time spent in the appointment was 30 minutes.     Truitt Merle, MD 05/22/2022   I, Wilburn Mylar, am acting as scribe for Truitt Merle, MD.   I have reviewed the above documentation for accuracy and completeness, and I agree with the above.

## 2022-05-23 ENCOUNTER — Ambulatory Visit (INDEPENDENT_AMBULATORY_CARE_PROVIDER_SITE_OTHER): Payer: Medicaid Other | Admitting: Family Medicine

## 2022-05-23 ENCOUNTER — Encounter: Payer: Self-pay | Admitting: Family Medicine

## 2022-05-23 ENCOUNTER — Other Ambulatory Visit: Payer: Self-pay

## 2022-05-23 ENCOUNTER — Ambulatory Visit
Admission: RE | Admit: 2022-05-23 | Discharge: 2022-05-23 | Disposition: A | Discharge status: Home / Self Care | Payer: Medicaid Other | Source: Ambulatory Visit | Attending: Radiation Oncology | Admitting: Radiation Oncology

## 2022-05-23 ENCOUNTER — Telehealth: Payer: Self-pay

## 2022-05-23 VITALS — BP 125/80 | HR 82 | Resp 14 | Wt 149.0 lb

## 2022-05-23 DIAGNOSIS — R2681 Unsteadiness on feet: Secondary | ICD-10-CM

## 2022-05-23 DIAGNOSIS — I10 Essential (primary) hypertension: Secondary | ICD-10-CM

## 2022-05-23 DIAGNOSIS — C220 Liver cell carcinoma: Secondary | ICD-10-CM

## 2022-05-23 DIAGNOSIS — E032 Hypothyroidism due to medicaments and other exogenous substances: Secondary | ICD-10-CM | POA: Diagnosis not present

## 2022-05-23 DIAGNOSIS — Z510 Encounter for antineoplastic radiation therapy: Secondary | ICD-10-CM | POA: Diagnosis not present

## 2022-05-23 DIAGNOSIS — A048 Other specified bacterial intestinal infections: Secondary | ICD-10-CM | POA: Insufficient documentation

## 2022-05-23 DIAGNOSIS — R748 Abnormal levels of other serum enzymes: Secondary | ICD-10-CM

## 2022-05-23 DIAGNOSIS — Z23 Encounter for immunization: Secondary | ICD-10-CM | POA: Diagnosis not present

## 2022-05-23 DIAGNOSIS — E063 Autoimmune thyroiditis: Secondary | ICD-10-CM

## 2022-05-23 LAB — RAD ONC ARIA SESSION SUMMARY
Course Elapsed Days: 7
Plan Fractions Treated to Date: 6
Plan Prescribed Dose Per Fraction: 3 Gy
Plan Total Fractions Prescribed: 10
Plan Total Prescribed Dose: 30 Gy
Reference Point Dosage Given to Date: 18 Gy
Reference Point Session Dosage Given: 3 Gy
Session Number: 6

## 2022-05-23 MED ORDER — OMEPRAZOLE 40 MG PO CPDR: 40.0000 mg | DELAYED_RELEASE_CAPSULE | Freq: Two times a day (BID) | ORAL | 0 refills | Status: AC

## 2022-05-23 MED ORDER — DICLOFENAC SODIUM 1 % EX GEL
CUTANEOUS | 1 refills | Status: DC
Start: 2022-05-23 — End: 2022-07-16

## 2022-05-23 MED ORDER — BISMUTH/METRONIDAZ/TETRACYCLIN 140-125-125 MG PO CAPS: 3.0000 | ORAL_CAPSULE | Freq: Four times a day (QID) | ORAL | 0 refills | Status: DC

## 2022-05-23 MED ORDER — LEVOTHYROXINE SODIUM 88 MCG PO TABS: 88.0000 ug | ORAL_TABLET | ORAL | 1 refills | Status: DC

## 2022-05-23 NOTE — Assessment & Plan Note (Signed)
He was noncompliant with therapy through part of May and June.  He has since restarted he reports.  Sent refill for 88 mcg to pharmacy.  We will repeat a TSH in 4 to 6 weeks I do anticipate that treating his H. pylori with antibiotics could affect his thyroid supplementation.  We will monitor closely over next few weeks and dose adjust accordingly.

## 2022-05-23 NOTE — Telephone Encounter (Signed)
Received message from Dr. Owens Shark regarding order for rollator walker.   Community message sent to [employer].   Will await response.   Talbot Grumbling, RN

## 2022-05-23 NOTE — Assessment & Plan Note (Signed)
LFTs slightly elevated yesterday.  Repeat full hepatic function panel today.

## 2022-05-23 NOTE — Patient Instructions (Signed)
It was wonderful to see you today.  Please bring ALL of your medications with you to every visit.   Today we talked about: --Using a walker with wheels at home. Our nurses will work on getting this to your house.  - Treating your stomach infection. I have sent the pill pack we discussed to your pharmacy (Pylera). You take this four times a day. Also take omeprazole twice a day  -This may affect your thyroid test--we will check your thyroid with your cancer labs in 4-6 weeks   - We are repeating your liver tests   --[place] kitembezi chenye magurudumu nyumbani. [place] wetu [employer] ili kupata hii nyumbani kwako.  - [place] maambukizi ya tumbo lako. Nimetuma kifurushi cha vidonge tulichojadili kwenye duka lako la dawa (Pylera). [place] hii mara nne kwa siku. Pia chukua omeprazole Charissa Bash kwa siku  -Hii inaweza kuathiri mtihani wako wa tezi--tutaangalia Isaiah Blakes na maabara yako ya Rema Jasmine wiki 4-6  - Tunarudia vipimo vyako vya ini  Please follow up in 6 months   Thank you for choosing [facility].   Please call [phone] with any questions about today's appointment.  Please be sure to schedule follow up at the front  desk before you leave today.   [name], MD  Family Medicine

## 2022-05-23 NOTE — Progress Notes (Signed)
SUBJECTIVE:   CHIEF COMPLAINT: hospital follow up  HPI:  ,The patient speaks Sw[place]ili as their primary language.  An interpreter was used for the entire visit.    Jim Little is a 76 y.o.  with history notable for Stage IIA hepatocellular carcinoma with portal vein thrombus due to tumor burden, hypertension, H. 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  This is also a hospital follow-up.  The patient was recently admitted after being found at home altered and dehydrated.  He was admitted for altered mental status which quickly improved with hydration.  During his hospital stay he was also found to have an ESBL UTI which was treated with 4 days of meropenem but followed by single dose of fosfomycin.  Head CT performed was relatively unremarkable except for some small vessel disease.  The patient is right-hand dominant.  He reports his only complaint today is left-sided back pain and some left arm pain intermittently.  He reports when he rolls on his left shoulder that hurts.  He denies numbness weakness or falls on the left shoulder.  No prior trauma to the area.  He reports his left neck area sometimes hurts as well.  The patient reports his appetite is okay he is drinking ginger ale and water.  He denies abdominal pain, nausea, vomiting, chest pain, dyspnea.  He reports he is urinating well and denies any further constipation.  He is unsure of what medications he is taking currently.  These are delivered to his house.  Patient has a history of H. pylori that was not treated after being focused on his cancer care.  He endorses some intermittent dyspepsia.  No constipation diarrhea nausea vomiting or hematochezia or melena.    PERTINENT  PMH / PSH/Family/Social History : Hepatocellular carcinoma now on tyrosine kinase inhibitor- Lenvima  Also history of hypertension, he has had a history of a recent urethral stricture and is status post dilation from  this this is improved.  His H. pylori was actually diagnosed a year ago.  OBJECTIVE:   BP 125/80   Pulse 82   Resp 14   Wt 149 lb (67.6 kg)   SpO2 98%   BMI 24.05 kg/m   Today's weight:  Last Weight  Most recent update: 05/23/2022  9:28 AM    Weight  67.6 kg (149 lb)            Review of prior weights: [employer]   05/23/22 0927  Weight: 149 lb (67.6 kg)    Elderly gentleman in no distress.  His gait is unstable and antalgic.  He is using a 4 pronged walker today.  Cardiac exam regular rate and rhythm no murmurs rubs or gallops lungs are clear bilaterally.  Abdomen is mildly distended with some right upper quadrant pain that is very mild no epigastric pain.   MSK exam Some mild cervical spine kyphosis more pronounced thoracic kyphosis Reduced range of motion of his neck with tenderness to palpation along left trapezius.  He also has some tenderness to palpation along the left AC joint and subacromial area.  He has good range of motion of his glenohumeral joint on the left and right.  ASSESSMENT/PLAN:   Hepatocellular carcinoma ([facility]) LFTs slightly elevated yesterday.  Repeat full hepatic function panel today.  Drug-induced hypothyroidism He was noncompliant with therapy through part of May and June.  He has since restarted he reports.  Sent refill for 88 mcg to pharmacy.  We will repeat a TSH in 4 to 6 weeks I do anticipate that treating his H. pylori with antibiotics could affect his thyroid supplementation.  We will monitor closely over next few weeks and dose adjust accordingly.  H. pylori infection Interactions with his current therapies were checked.  This could affect the absorption of his thyroid medication will monitor his TSH accordingly.  Sent preferred therapy with Pylera to pharmacy.  Also will increase PPI to twice daily dosing.  Discussed with him and will message Jerral Bonito to see if she can assist with compliance.  Essential hypertension Blood pressure is  at goal today, appears to be quite elevated when he goes to the cancer center.  We will continue to monitor continue amlodipine for now.   Trapezius strain, suspected differential also includes rotator cuff tear we will trial diclofenac gel encouraged range of motion activities I suspect this may be in part due to his cane use with his hand.  See plan for unstable gait  Unstable gait likely due to deconditioning the setting of cancer as well as osteoarthritis discussed at length with him I am concerned about falls with the 4-prong walker.  Discussed and ordered a rollator.  HCM PCV20 today     [name], MD  [place]

## 2022-05-23 NOTE — Assessment & Plan Note (Signed)
Interactions with his current therapies were checked.  This could affect the absorption of his thyroid medication will monitor his TSH accordingly.  Sent preferred therapy with Pylera to pharmacy.  Also will increase PPI to twice daily dosing.  Discussed with him and will message Jerral Bonito to see if she can assist with compliance.

## 2022-05-23 NOTE — Assessment & Plan Note (Signed)
Blood pressure is at goal today, appears to be quite elevated when he goes to the cancer center.  We will continue to monitor continue amlodipine for now.

## 2022-05-24 ENCOUNTER — Other Ambulatory Visit (HOSPITAL_COMMUNITY): Payer: Self-pay

## 2022-05-24 ENCOUNTER — Ambulatory Visit
Admission: RE | Admit: 2022-05-24 | Discharge: 2022-05-24 | Disposition: A | Discharge status: Home / Self Care | Payer: Medicaid Other | Source: Ambulatory Visit | Attending: Radiation Oncology | Admitting: Radiation Oncology

## 2022-05-24 ENCOUNTER — Telehealth: Payer: Self-pay | Admitting: Hematology

## 2022-05-24 ENCOUNTER — Other Ambulatory Visit: Payer: Self-pay

## 2022-05-24 DIAGNOSIS — Z510 Encounter for antineoplastic radiation therapy: Secondary | ICD-10-CM | POA: Diagnosis not present

## 2022-05-24 LAB — HEPATIC FUNCTION PANEL
ALT: 44 IU/L (ref 0–44)
AST: 71 IU/L — ABNORMAL HIGH (ref 0–40)
Albumin: 3.9 g/dL (ref 3.7–4.7)
Alkaline Phosphatase: 182 IU/L — ABNORMAL HIGH (ref 44–121)
Bilirubin Total: 0.8 mg/dL (ref 0.0–1.2)
Bilirubin, Direct: 0.21 mg/dL (ref 0.00–0.40)
Total Protein: 8.6 g/dL — ABNORMAL HIGH (ref 6.0–8.5)

## 2022-05-24 LAB — RAD ONC ARIA SESSION SUMMARY
Course Elapsed Days: 8
Plan Fractions Treated to Date: 7
Plan Prescribed Dose Per Fraction: 3 Gy
Plan Total Fractions Prescribed: 10
Plan Total Prescribed Dose: 30 Gy
Reference Point Dosage Given to Date: 21 Gy
Reference Point Session Dosage Given: 3 Gy
Session Number: 7

## 2022-05-24 NOTE — Telephone Encounter (Signed)
Scheduled follow-up appointment per 6/28 los. [name] [name] [facility]) is aware.

## 2022-05-27 ENCOUNTER — Other Ambulatory Visit: Payer: Self-pay

## 2022-05-27 ENCOUNTER — Ambulatory Visit
Admission: RE | Admit: 2022-05-27 | Discharge: 2022-05-27 | Disposition: A | Discharge status: Home / Self Care | Payer: Medicaid Other | Source: Ambulatory Visit | Attending: Radiation Oncology | Admitting: Radiation Oncology

## 2022-05-27 DIAGNOSIS — Z87440 Personal history of urinary (tract) infections: Secondary | ICD-10-CM | POA: Insufficient documentation

## 2022-05-27 DIAGNOSIS — C220 Liver cell carcinoma: Secondary | ICD-10-CM | POA: Insufficient documentation

## 2022-05-27 DIAGNOSIS — I10 Essential (primary) hypertension: Secondary | ICD-10-CM | POA: Insufficient documentation

## 2022-05-27 DIAGNOSIS — Z510 Encounter for antineoplastic radiation therapy: Secondary | ICD-10-CM | POA: Insufficient documentation

## 2022-05-27 DIAGNOSIS — K5900 Constipation, unspecified: Secondary | ICD-10-CM | POA: Insufficient documentation

## 2022-05-27 DIAGNOSIS — I81 Portal vein thrombosis: Secondary | ICD-10-CM | POA: Diagnosis not present

## 2022-05-27 DIAGNOSIS — C778 Secondary and unspecified malignant neoplasm of lymph nodes of multiple regions: Secondary | ICD-10-CM | POA: Diagnosis not present

## 2022-05-27 DIAGNOSIS — K7460 Unspecified cirrhosis of liver: Secondary | ICD-10-CM | POA: Insufficient documentation

## 2022-05-27 LAB — RAD ONC ARIA SESSION SUMMARY
Course Elapsed Days: 11
Plan Fractions Treated to Date: 8
Plan Prescribed Dose Per Fraction: 3 Gy
Plan Total Fractions Prescribed: 10
Plan Total Prescribed Dose: 30 Gy
Reference Point Dosage Given to Date: 24 Gy
Reference Point Session Dosage Given: 3 Gy
Session Number: 8

## 2022-05-29 ENCOUNTER — Telehealth: Payer: Self-pay

## 2022-05-29 ENCOUNTER — Other Ambulatory Visit: Payer: Self-pay

## 2022-05-29 ENCOUNTER — Telehealth: Payer: Self-pay | Admitting: Family Medicine

## 2022-05-29 ENCOUNTER — Ambulatory Visit
Admission: RE | Admit: 2022-05-29 | Discharge: 2022-05-29 | Disposition: A | Discharge status: Home / Self Care | Payer: Medicaid Other | Source: Ambulatory Visit | Attending: Radiation Oncology | Admitting: Radiation Oncology

## 2022-05-29 DIAGNOSIS — Z510 Encounter for antineoplastic radiation therapy: Secondary | ICD-10-CM | POA: Diagnosis not present

## 2022-05-29 LAB — RAD ONC ARIA SESSION SUMMARY
Course Elapsed Days: 13
Plan Fractions Treated to Date: 9
Plan Prescribed Dose Per Fraction: 3 Gy
Plan Total Fractions Prescribed: 10
Plan Total Prescribed Dose: 30 Gy
Reference Point Dosage Given to Date: 27 Gy
Reference Point Session Dosage Given: 3 Gy
Session Number: 9

## 2022-05-29 NOTE — Telephone Encounter (Signed)
[facility] contacted for Flomax refill. Noted change and advised patient to take 2 capsules daily per orders.  [name] Chinenye Katzenberger RN BSN [place] Nurse [phone]

## 2022-05-29 NOTE — Telephone Encounter (Signed)
Attempted to contact patient's EC [name] [name] to schedule a Palliative Care consult appointment. No answer left a message to return call.

## 2022-05-29 NOTE — Congregational Nurse Program (Signed)
  Dept: [phone]   Congregational Nurse Program Note  Date of Encounter: 05/29/2022  Past Medical History: Past Medical History:  Diagnosis Date   BPH (benign prostatic hyperplasia)    Cirrhosis ([place])    Hypertension    Splenomegaly     Encounter Details:  CNP Questionnaire - 05/29/22 1259       Questionnaire   Do you give verbal consent to treat you today? Yes    Location Patient Served  [facility]    Visit Setting Hospital/Urgent Care    Patient Status Refugee    Insurance [facility]    Insurance Referral Medicaid    Medication Have Medication Insecurities;Provided Medication Assistance    Medical Provider Yes    Screening Referrals N/A    Medical Referral N/A    Medical Appointment Made Other    Food N/A    Transportation Need transportation assistance;Provided transportation assistance;Referred to transportation service    Housing/Utilities N/A    Interpersonal Safety N/A    Intervention Advocate;Navigate Healthcare System;Case Management;Counsel;Educate;Support    ED Visit Averted Yes    Life-Saving Intervention Made N/A           Home visit completed. Inspected his medications and noted to have left over medication. Wife stated that he has been non compliant. Re-educated about medication compliance. Pylera medication for H. Pylori has been delivered to patient home. Educated on how to administer medication. Both spouse and patient verbalized understanding. Patient advised to continue other medication as advised. Norvasc, flomax and omeprazole placed in a pill box organizer to be taken together daily at the same time.Thyroid medication to be taken daily before breakfast and left in its original packaging.Chemo treatment to be taken as advised and left in its original packaging. Patient is feeling overwhelmed by all the medication.He is running low on flomax and I will call for a refill. I have spoken with Lexine Baton from [facility] and initial palliative consultation with  be on July 10th. Patient made aware and ok with the date and time of this appointment.   [name] Lamesha Tibbits RN BSN [place] Nurse [phone]

## 2022-05-29 NOTE — Telephone Encounter (Signed)
The patient speaks Melene Muller as their primary language.  An interpreter was used for the entire visit.  Called patient with Swahili.  Called with hepatic panel, which is improving. [name] RN is helping with H. Pylori treatment. Will plan for home visit at 3 month follow up window.

## 2022-05-29 NOTE — Telephone Encounter (Signed)
Spoke with patient's EC Jim Little and scheduled a in person Palliative Consult for 06/03/22 @ 11 AM.   Consent obtained; updated Netsmart, Team List and Epic.

## 2022-05-30 ENCOUNTER — Other Ambulatory Visit: Payer: Self-pay

## 2022-05-30 ENCOUNTER — Telehealth: Payer: Self-pay

## 2022-05-30 ENCOUNTER — Encounter: Payer: Self-pay | Admitting: Radiation Oncology

## 2022-05-30 ENCOUNTER — Other Ambulatory Visit (HOSPITAL_COMMUNITY): Payer: Self-pay

## 2022-05-30 ENCOUNTER — Ambulatory Visit
Admission: RE | Admit: 2022-05-30 | Discharge: 2022-05-30 | Disposition: A | Discharge status: Home / Self Care | Payer: Medicaid Other | Source: Ambulatory Visit | Attending: Radiation Oncology | Admitting: Radiation Oncology

## 2022-05-30 DIAGNOSIS — Z510 Encounter for antineoplastic radiation therapy: Secondary | ICD-10-CM | POA: Insufficient documentation

## 2022-05-30 DIAGNOSIS — C220 Liver cell carcinoma: Secondary | ICD-10-CM | POA: Insufficient documentation

## 2022-05-30 LAB — RAD ONC ARIA SESSION SUMMARY
Course Elapsed Days: 14
Plan Fractions Treated to Date: 10
Plan Prescribed Dose Per Fraction: 3 Gy
Plan Total Fractions Prescribed: 10
Plan Total Prescribed Dose: 30 Gy
Reference Point Dosage Given to Date: 30 Gy
Reference Point Session Dosage Given: 3 Gy
Session Number: 10

## 2022-05-30 NOTE — Telephone Encounter (Signed)
Patient called with appointment reminder  Honor Loh RN BSN [place] Nurse [phone]

## 2022-06-02 NOTE — Progress Notes (Signed)
[place] Consult Note Telephone: [phone]  Fax: [phone]   Date of encounter: 06/02/22 3:01 PM PATIENT NAME: Jim Little [place] [phone]   [phone] (home)  DOB: [birth year] MRN: [id number] PRIMARY CARE PROVIDER:    [name], MD,  [address] [place] [address] [phone]  REFERRING PROVIDER:   [name], MD [address],  [place] [address] [phone]  RESPONSIBLE PARTY:    Contact Information     Name Relation Home Work Mobile   Jim Little Other [phone]  [phone]   Jim Little [phone]  [phone]   Jim Little [phone]  [phone]   Jim Little [phone]  [phone]        I met face to face with patient and family in his home. Palliative Care was asked to follow this patient by consultation request of  Jim Malay, MD to address advance care planning and complex medical decision making. This is the initial visit.                                     ASSESSMENT AND PLAN / RECOMMENDATIONS:   Advance Care Planning/Goals of Care: Goals include to maximize quality of life and symptom management. Patient/health care surrogate gave his/her permission to discuss.Our advance care planning conversation included a discussion about:    The value and importance of advance care planning  Experiences with loved ones who have been seriously ill or have died  Exploration of personal, cultural or spiritual beliefs that might influence medical decisions  Exploration of goals of care in the event of a sudden injury or illness  Identification  of a healthcare agent--His wife would be his decision maker.  2 eldest boys in household (stepsons) would decide.   Review and updating or creation of an  advance directive document . Discussed patient's faith.  He believes that when people die, if they've been good people, they  go to heaven, if they've been bad people, they go to hell.  He believes that God has given the doctors the knowledge to help patients.   If he is in poor health and not expected to improve, he believes that God will end his life at the right time regardless of what we doctors try to do.  He also understands that we would not want to put him through treatments that would only make his quality of life worse if they are not expected to make him better in the end.  He does not want to suffer. Decision not to resuscitate or to de-escalate disease focused treatments due to poor prognosis. CODE STATUS:  he wants the doctors to do what we can to help him.  If he's not getting better, he will die regardless.  I could not get a definitive answer about CPR itself.   He understands he has hepatocellular cancer that cannot be cured with mets to the lymph nodes.  He says he is taking whatever is recommended to him, but he does not understand what his prognosis is like.   He is stressed and overwhelmed by all of his medications.  Jim Little has put most of them that he can take together into a pillbox.  He needs synthroid refilled and his wife has placed his chemo medication somewhere separately and the children could not find it for pt when he asked them.  Pt was still not understanding how to properly take his meds (does not read english).  Reviewed with Jim Little and she was going to write him some directions in his native language about taking his levothyroxine before breakfast and his other pills from a single box of the pillbox after breakfast plus taking his lenvatinib 3 capsules daily (he has not taken it yet today).    He had randomly taken Saturday's pills this am after she filled the boxes last evening.    Symptom Management/Plan: 1. Hepatocellular carcinoma ([place]) -s/p XRT for nodal mets  -continues on lenvatinib--emphasized importance of daily use of 3 26m capsules for it to be effective -he denies any symptoms  of concern (no nausea, constipation (actually loose from med), abdominal pain, weakness or confusion) -not using lactulose due to already loose bms -h pylori tx completed reportedly  2. Portal vein thrombosis -not on anticoagulation (initially had heparin at hospital when dx'd)  3. Other cirrhosis of liver ([place] -preexisting -I question if he does have some hepatic encephalopathy due to confusion about meds, but it was difficult to tell--no asterixis -having loose bms from oral chemo  4. Palliative care by specialist -initiated ACP discussions as above with help of [name] interpreting -several social issues remain--some financial, is on medicaid, he is primary caregiver for his son who has some intellectual disability and someone else in home has cerebral palsy -several small children are in the home -his wife works -hopefully, between [employer]and I, we were able to solve the medication confusion today to prevent missed meds or errors in taking them -will ask social work to make next visit with interpreter to see him   Follow up Palliative Care Visit: Palliative care will continue to follow for complex medical decision making, advance care planning, and clarification of goals. Return 2-4 weeks by social work and then I can follow-up in about 8 weeks.   This visit was coded based on medical decision making (MDM).48 minutes spent on ACP  PPS: 50%  HOSPICE ELIGIBILITY/DIAGNOSIS: TBD  Chief Complaint: initial palliative care consult  HISTORY OF PRESENT ILLNESS:  AGari Hartsellis a 76y.o. year old male  with hepatocellular carcinoma, hepatic cirrhosis, splenomegaly, portal vein thrombosis, thrombocytopenia, htn, gerd, BPH with retention who [place]  Notes indicated he is the primary caregiver for a 357yo son with cerebral palsy.  I am seeing him with the help of DEarlie ServerMuhoro interpreting.  His wife is the only close family here that  can care for his son besides him.  She has diabetes.    Dr. FBurr Medicois his oncologist and he's also been receiving radation therapy through Dr. MLisbeth Renshaw  He was initially diagnosed with hepatocellular ca after MRI of the liver 01/22/21 showed advanced cirrhotic changes and a large infiltrating mass vs numerous confluent masses of the right hepatic lobe.  He also had thrombosis of his middle and portal veins and an AFP level 01/14/21 of [id number]  His initial staging CT did not show any metastatic disease.  He underwent Y90 radiomembolization in April 2022 and AFP dropped significantly.  MRI in July 2022 showed a good response in the liver, but a new hepatoduodenal ligament lymph node 2x1.7cm c/w metastatic disease.  He was treated with tecentriq and avastin 06/29/21 which was tolerated well outside of constipation.  His AFP rose so CT of abdomen 4/23 showed unchanged right liver mass, significant enlargement of his porta hepatis node and unchanged  smaller retroperitoneal nodes.  He then started lenvatinib and continued it since April.  He had a hospitalization in June with UTI and delirium.  When seen end of June, Dr. Burr Medico continued his lenvatinib and close monitoring as there was noted to be slight increase in AST to 79 and ALT to 48 with WBC 2.7.  He also received radiation thru 05/30/22 to the metastatic portocaval lymph nodes.  Plan is for repeat CT in 2-3 mos and AFP monitoring.  He was not faithfully taking his lenvatinib at his last onc appt.  He had heparin when thrombus initially identified, but no long-term anticoagulation for malignancy-related cause.  Also unclear cause of cirrhosis.   Per Dr. Burr Medico, he is followed by Dr. Therisa Doyne. Hep B surface antigen non reactive (05/15/20), ANA negative, Hep C ab non reactive. No alcohol abuse. Prior work up with EGD/Colonoscopy in 07/2020 which showed no varices   History obtained from review of EMR, discussion with primary team, and interview with family, facility  staff/caregiver and/or Mr. Thomason.  I reviewed available labs, medications, imaging, studies and related documents from the EMR.  Records reviewed and summarized above.   ROS Review of Systems  Constitutional:  Negative for chills, fever, malaise/fatigue and weight loss.  HENT:  Negative for congestion.   Respiratory:  Negative for cough and shortness of breath.   Cardiovascular:  Negative for chest pain, palpitations and leg swelling.  Gastrointestinal:  Positive for diarrhea. Negative for abdominal pain, blood in stool, constipation, melena, nausea and vomiting.  Genitourinary:  Negative for dysuria.  Musculoskeletal:  Negative for falls and joint pain.       Unsteady gait with LLE weakness, uses cane but still unsteady (rollator has been ordered by PCP)  Skin:  Negative for itching and rash.  Neurological:  Negative for dizziness and loss of consciousness.  Endo/Heme/Allergies:  Does not bruise/bleed easily.  Psychiatric/Behavioral:  Negative for depression. The patient is nervous/anxious.        May have some confusion, but he's not aware of it himself     Physical Exam: Current and past weights:  6/29 149 lb--has oscillated from 130s-152 lbs over past 3 yrs Physical Exam Vitals reviewed.  Constitutional:      Appearance: Normal appearance.     Comments: Slight buccal wasting and some atrophy of arm muscles noted  HENT:     Head: Normocephalic and atraumatic.  Cardiovascular:     Rate and Rhythm: Normal rate.  Pulmonary:     Effort: Pulmonary effort is normal.  Neurological:     Mental Status: He is alert.     Comments: Right leg weakness  Psychiatric:        Mood and Affect: Mood normal.      CURRENT PROBLEM LIST:  Patient Active Problem List   Diagnosis Date Noted   H. 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   Hepatocellular carcinoma ([place])  02/09/2021   Portal vein thrombosis 01/13/2021   Urinary retention 05/11/2020   Thrombocytopenia ([place]) 05/11/2020   Refugee health examination 04/16/2018   Splenomegaly 04/16/2018   Chronic back pain 04/16/2018   Hepatic cirrhosis ([place]) 04/16/2018   Essential hypertension 04/16/2018   PAST MEDICAL HISTORY:  Active Ambulatory Problems    Diagnosis Date Noted   Refugee health examination 04/16/2018   Splenomegaly 04/16/2018   Chronic back pain 04/16/2018   Hepatic cirrhosis ([place]) 04/16/2018   Essential hypertension  04/16/2018   Urinary retention 05/11/2020   Thrombocytopenia ([place]) 05/11/2020   Portal vein thrombosis 01/13/2021   Hepatocellular carcinoma ([place]) 02/09/2021   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 pylori infection 05/23/2022   Resolved Ambulatory Problems    Diagnosis Date Noted   Chronic knee pain 04/16/2018   Lower urinary tract symptoms (LUTS) 04/16/2018   Constipation 05/06/2018   Bilateral cataracts 07/15/2018   Sepsis ([place]) 05/11/2020   Fever 06/06/2020   Abdominal pain 02/09/2021   Hypertension 02/09/2021   Weight loss 02/09/2021   Scrotal pain 03/27/2021   Infective urethritis 03/27/2021   Scrotal hernia 27/78/2423   Acute metabolic encephalopathy 53/61/4431   Acute cystitis without hematuria 05/13/2022   Bacterial conjunctivitis of both eyes 05/13/2022   Past Medical History:  Diagnosis Date   Cirrhosis ([place])    SOCIAL HX:  Social History   Tobacco Use   Smoking status: Never   Smokeless tobacco: Never  Substance Use Topics   Alcohol use: Never   FAMILY HX:  Family History  Problem Relation Age of Onset   Heart disease Neg Hx       ALLERGIES:  Allergies  Allergen Reactions   Pork-Derived Products     Religious reasons.Patient is a muslim     PERTINENT MEDICATIONS:  Outpatient Encounter Medications as of 06/03/2022  Medication Sig    amLODipine (NORVASC) 10 MG tablet Take 1 tablet (10 mg total) by mouth daily.   Bismuth/Metronidaz/Tetracyclin (PYLERA) 140-125-125 MG CAPS Take 3 tablets by mouth in the morning, at noon, in the evening, and at bedtime.   diclofenac Sodium (VOLTAREN) 1 % GEL Apply to L shoulder area   folic acid (FOLVITE) 1 MG tablet Take 1 tablet by mouth daily.   lactulose (KRISTALOSE) 10 g packet TAKE 1 PACKET (10 G TOTAL) BY MOUTH AS NEEDED (CONSTIPATION). DISSOLVE IN EIGHT OUNCES OF WATER (Patient not taking: Reported on 04/24/2022)   lenvatinib 12 mg daily dose (LENVIMA, 12 MG DAILY DOSE,) 3 x 4 MG capsule Take 12 mg by mouth daily. Take as instructed by MD   levothyroxine (SYNTHROID) 88 MCG tablet Take 1 tablet (88 mcg total) by mouth every morning. 30 minutes before food   Olopatadine HCl (PAZEO) 0.7 % SOLN Use once daily in both eyes (Patient not taking: Reported on 04/24/2022)   omeprazole (PRILOSEC) 40 MG capsule Take 1 capsule (40 mg total) by mouth 2 (two) times daily.   tamsulosin (FLOMAX) 0.4 MG CAPS capsule Take 1 capsule (0.4 mg total) by mouth daily. Please pack and mail to home (Patient not taking: Reported on 05/12/2022)   Facility-Administered Encounter Medications as of 06/03/2022  Medication   sodium chloride flush (NS) 0.9 % injection 10 mL   Thank you for the opportunity to participate in the care of Mr. Nguyen.  The palliative care team will continue to follow. Please call our office at [phone] if we can be of additional assistance.   Hollace Kinnier, DO   COVID-19 PATIENT SCREENING TOOL Asked and negative response unless otherwise noted:  Have you had symptoms of covid, tested positive or been in contact with someone with symptoms/positive test in the past 5-10 days? no

## 2022-06-03 ENCOUNTER — Other Ambulatory Visit: Payer: Medicaid Other | Admitting: Internal Medicine

## 2022-06-03 ENCOUNTER — Other Ambulatory Visit (HOSPITAL_COMMUNITY): Payer: Self-pay

## 2022-06-03 ENCOUNTER — Encounter: Payer: Self-pay | Admitting: Internal Medicine

## 2022-06-03 DIAGNOSIS — C220 Liver cell carcinoma: Secondary | ICD-10-CM

## 2022-06-03 DIAGNOSIS — K7469 Other cirrhosis of liver: Secondary | ICD-10-CM

## 2022-06-03 DIAGNOSIS — Z515 Encounter for palliative care: Secondary | ICD-10-CM

## 2022-06-03 DIAGNOSIS — I81 Portal vein thrombosis: Secondary | ICD-10-CM

## 2022-06-04 ENCOUNTER — Encounter: Payer: Self-pay | Admitting: Hematology

## 2022-06-04 NOTE — Progress Notes (Signed)
                                                                                                                                                             Patient Name: Jim Little MRN: [id number] DOB: 1946-04-05 Referring Physician: Truitt Merle (Profile Not Attached) Date of Service: 05/30/2022 [facility] [place]                                                        End Of Treatment Note  Diagnoses: C22.0-Liver cell carcinoma  Cancer Staging:   Progressive Stage IIIA, cT3N0M0, Hepatocellular Carcinoma of the Right liver with portohepatic nodal progression.  Intent: Palliative  Radiation Treatment Dates: 05/16/2022 through 05/30/2022 Site Technique Total Dose (Gy) Dose per Fx (Gy) Completed Fx Beam Energies  Abdomen: Abd IMRT 30/30 3 10/10 6XFFF   Narrative: The patient tolerated radiation therapy relatively well.    Plan: The patient will receive a call in about one month from the radiation oncology department. He will continue follow up with Dr. Burr Medico as well.   ________________________________________________    Carola Rhine, [employer]

## 2022-06-06 ENCOUNTER — Other Ambulatory Visit (HOSPITAL_COMMUNITY): Payer: Self-pay

## 2022-06-17 ENCOUNTER — Other Ambulatory Visit: Payer: Self-pay

## 2022-06-19 ENCOUNTER — Telehealth: Payer: Self-pay

## 2022-06-19 NOTE — Telephone Encounter (Signed)
Called patient for an appointment reminder. Transportation scheduled.  [name] Quin Mcpherson RN BSN [place] Nurse [phone]

## 2022-06-20 ENCOUNTER — Inpatient Hospital Stay: Payer: Medicaid Other | Attending: Hematology | Admitting: Hematology

## 2022-06-20 ENCOUNTER — Encounter: Payer: Self-pay | Admitting: Hematology

## 2022-06-20 ENCOUNTER — Inpatient Hospital Stay: Payer: Medicaid Other

## 2022-06-20 ENCOUNTER — Other Ambulatory Visit: Payer: Self-pay

## 2022-06-20 VITALS — BP 142/74 | HR 75 | Temp 98.5°F | Resp 18 | Ht 66.0 in | Wt 149.4 lb

## 2022-06-20 DIAGNOSIS — C220 Liver cell carcinoma: Secondary | ICD-10-CM | POA: Diagnosis present

## 2022-06-20 DIAGNOSIS — Z79899 Other long term (current) drug therapy: Secondary | ICD-10-CM | POA: Insufficient documentation

## 2022-06-20 DIAGNOSIS — Z87440 Personal history of urinary (tract) infections: Secondary | ICD-10-CM | POA: Diagnosis not present

## 2022-06-20 DIAGNOSIS — Z923 Personal history of irradiation: Secondary | ICD-10-CM | POA: Insufficient documentation

## 2022-06-20 DIAGNOSIS — C779 Secondary and unspecified malignant neoplasm of lymph node, unspecified: Secondary | ICD-10-CM | POA: Insufficient documentation

## 2022-06-20 DIAGNOSIS — I81 Portal vein thrombosis: Secondary | ICD-10-CM | POA: Diagnosis not present

## 2022-06-20 DIAGNOSIS — I10 Essential (primary) hypertension: Secondary | ICD-10-CM | POA: Diagnosis not present

## 2022-06-20 DIAGNOSIS — K7460 Unspecified cirrhosis of liver: Secondary | ICD-10-CM | POA: Insufficient documentation

## 2022-06-20 LAB — CBC WITH DIFFERENTIAL (CANCER CENTER ONLY)
Abs Immature Granulocytes: 0.01 10*3/uL (ref 0.00–0.07)
Basophils Absolute: 0 10*3/uL (ref 0.0–0.1)
Basophils Relative: 0 %
Eosinophils Absolute: 0.2 10*3/uL (ref 0.0–0.5)
Eosinophils Relative: 8 %
HCT: 35 % — ABNORMAL LOW (ref 39.0–52.0)
Hemoglobin: 12.1 g/dL — ABNORMAL LOW (ref 13.0–17.0)
Immature Granulocytes: 0 %
Lymphocytes Relative: 19 %
Lymphs Abs: 0.5 10*3/uL — ABNORMAL LOW (ref 0.7–4.0)
MCH: 34.7 pg — ABNORMAL HIGH (ref 26.0–34.0)
MCHC: 34.6 g/dL (ref 30.0–36.0)
MCV: 100.3 fL — ABNORMAL HIGH (ref 80.0–100.0)
Monocytes Absolute: 0.5 10*3/uL (ref 0.1–1.0)
Monocytes Relative: 17 %
Neutro Abs: 1.5 10*3/uL — ABNORMAL LOW (ref 1.7–7.7)
Neutrophils Relative %: 56 %
Platelet Count: 86 10*3/uL — ABNORMAL LOW (ref 150–400)
RBC: 3.49 MIL/uL — ABNORMAL LOW (ref 4.22–5.81)
RDW: 13.5 % (ref 11.5–15.5)
WBC Count: 2.8 10*3/uL — ABNORMAL LOW (ref 4.0–10.5)
nRBC: 0 % (ref 0.0–0.2)

## 2022-06-20 LAB — CMP (CANCER CENTER ONLY)
ALT: 43 U/L (ref 0–44)
AST: 67 U/L — ABNORMAL HIGH (ref 15–41)
Albumin: 3.7 g/dL (ref 3.5–5.0)
Alkaline Phosphatase: 141 U/L — ABNORMAL HIGH (ref 38–126)
Anion gap: 4 — ABNORMAL LOW (ref 5–15)
BUN: 13 mg/dL (ref 8–23)
CO2: 26 mmol/L (ref 22–32)
Calcium: 9.3 mg/dL (ref 8.9–10.3)
Chloride: 108 mmol/L (ref 98–111)
Creatinine: 0.74 mg/dL (ref 0.61–1.24)
GFR, Estimated: >60 mL/min (ref >60)
Glucose, Bld: 118 mg/dL — ABNORMAL HIGH (ref 70–99)
Potassium: 3.9 mmol/L (ref 3.5–5.1)
Sodium: 138 mmol/L (ref 135–145)
Total Bilirubin: 0.8 mg/dL (ref 0.3–1.2)
Total Protein: 8.3 g/dL — ABNORMAL HIGH (ref 6.5–8.1)

## 2022-06-20 LAB — TSH: TSH: 3.131 u[IU]/mL (ref 0.350–4.500)

## 2022-06-20 LAB — T4, FREE: Free T4: 0.76 ng/dL (ref 0.61–1.12)

## 2022-06-20 NOTE — Progress Notes (Signed)
Jim Little   Telephone:([phone]) [phone] Fax:(336) [phone]   Clinic Follow up Note   Patient Care Team: [name], MD as PCP - General (Family Medicine) Jonnie Finner, RN (Inactive) as Oncology Nurse Navigator Heilingoetter, Tobe Sos, PA-C as Physician Assistant (Physician Assistant) Truitt Merle, MD as Consulting Physician (Oncology)  Date of Service:  06/20/2022  CHIEF COMPLAINT: f/u of [facility]  CURRENT THERAPY:  Lenvatinib 12 mg daily, starting 03/13/22  ASSESSMENT & PLAN:  Jim Little is a 76 y.o. male with   1. Hepatocellular Carcinoma, unresectable, stage IIIA, with node metastasis  -presented with nausea/vomiting, h/o splenomegaly and cirrhosis. Prior liver biopsy 07/14/20 was negative for malignancy. MRI liver on 01/12/21 showed Large infiltrating mass vs numerous confluent masses involving the right hepatic lobe. This is consistent with hepatocellular carcinoma. Baseline AFP 01/14/21 was 11,904. -staging chest CT 01/15/21 was negative -s/p Y 90 radioembolization on 03/08/21, his AFP dropped significantly -he developed metastatic disease is a hepatoduodenal ligament lymph node on MRI 05/29/21. He started [place] and Avastin on 06/29/21, tolerated relatively well with constipation. -due to rising AFP, CT AP obtained on 03/06/22 showed: unchanged right liver mass; significant enlargement of porta hepatis node; unchanged smaller RP nodes. -The patient started lenvatinib on ~03/13/22.  He states he is on '12mg'$  daily now and tolerating well  -s/p radiation to metastatic lymph nodes 6/22-05/30/22 -labs reviewed, liver enzymes improved, AST remains slightly elevated. He reports fatigue and weakness, and he notes he did not refill synthroid. I will check his thyroid function today to see if he needs to restart synthroid. -Plan to repeat CT scan in 1 months, will continue monitoring AFP.   2. Recurrent UTI, h/o of urinary retention -UA has been positive on 01/03/22, 01/23/22, 03/29/22  (at [facility]), and 05/12/22.   3. Social Support, Language barrier -From the [place], was at a refugee camp in [place] -Has several family members living in single house. 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 Neither he nor his family speak [place]  -He speaks Swahili. He has [facility], Honor Loh, who helps him. Her contact is [phone]   4. Portal vein thrombosis, Cirrhosis  -Seen with direct admission from 2/19-2/22/22 -Patient received heparin drip inpatient. Dr. Marin Olp did not recommend anticoagulation since the thrombus is tumor related. -He has unclear etiology of cirrhosis, which is followed by Dr. Therisa Doyne. Hep B surface antigen non reactive (05/15/20), ANA negative, Hep C ab non reactive. No alcohol abuse. Prior work up with EGD/Colonoscopy in 07/2020 showed no varices      PLAN: -Continue lenvatinib 12 mg daily -f/u in 4 weeks, with lab and CT several days before -will call [name] about his lab results, and restarting Synthroid   No problem-specific Assessment & Plan notes found for this encounter.   SUMMARY OF ONCOLOGIC HISTORY: Oncology History Overview Note  Cancer Staging Hepatocellular carcinoma [facility]) Staging form: Liver, AJCC 8th Edition - Clinical stage from 02/09/2021: Stage IIIA (cT3, cN0, cM0) - Signed by Truitt Merle, MD on 02/09/2021 Stage prefix: Initial diagnosis    Hepatocellular carcinoma ([place])  05/12/2020 Imaging   MRI Liver  IMPRESSION: 1. Geographic lesion in the inferior medial aspect of the RIGHT hepatic lobe has some imaging features suggesting focal fatty infiltration. However, atypical enhancement pattern. Recommend follow-up contrast MRI in 3 to 6 months to re-evaluate. 2. No additional lesion liver. 3. Morphologic changes consistent with cirrhosis.   01/12/2021 Imaging  MRI Abdomen  IMPRESSION: 1. Advanced cirrhotic changes involving the liver. There  is a large infiltrating mass versus numerous confluent masses involving the right hepatic lobe and consistent with hepatocellular carcinoma. Associated thrombosed middle and portal veins. No definite left hepatic lobe lesions. LI-RADS 5. 2. Borderline splenomegaly. 3. No ascites or abdominal wall hernia.     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 The ultrasound correlate of the previously demonstrated right liver tumor is heterogeneous tissue with ill-defined margin.   In addition to the right-sided tumor, there are several small hyperechoic foci of approximately 1 cm, potentially additional [place] foci versus regenerative nodules.   Cirrhosis and splenomegaly   01/15/2021 Imaging   CT Chest  IMPRESSION: 1. No evidence of metastatic disease in the chest. 2. Cirrhotic morphology of the liver with ill-defined, heterogeneous mass of the right lobe of the liver and occlusion of the right portal vein, in keeping with known hepatocellular carcinoma, better demonstrated by recent MR.   02/09/2021 Initial Diagnosis   Hepatocellular carcinoma ([place])   02/09/2021 Cancer Staging   Staging form: Liver, AJCC 8th Edition - Clinical stage from 02/09/2021: Stage IIIA (cT3, cN0, cM0) - Signed by Truitt Merle, MD on 02/09/2021 Stage prefix: Initial diagnosis   05/29/2021 Imaging   MRI Abdomen  IMPRESSION: 1. Interval decrease in size of the infiltrating mass in the right hepatic lobe showing heterogeneous arterial phase hyperenhancement. Persistent arterial phase enhancement can be seen early post y 25 embolization. No substantial restricted diffusion in the lesion on today's study. 2. Tiny focus of arterial phase hyperenhancement in the dome of the liver near the junction of segment IV and VIII. Unclear whether this  was present previously given the marked motion degradation on the earlier study. Close attention on follow-up recommended. 3. 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 Subtraction postcontrast imaging suggests that there may be some enhancement in the thrombus suggesting tumor thrombus or a combination of tumor and bland thrombus. 4. Interval increase in size of the hepatoduodenal ligament lymph node measuring 2.0 x 1.7 cm. Likely metastatic disease,  continued close attention on follow-up recommended. 5. Markedly distended urinary bladder.   06/29/2021 -  Chemotherapy   Patient is on Treatment Plan : LUNG Atezolizumab + Bevacizumab q21d Maintenance     11/28/2021 Imaging   EXAM: CT ABDOMEN AND PELVIS WITH CONTRAST  IMPRESSION: 1. Cirrhotic morphology of the liver with redemonstrated post procedural findings of right hepatic lobe embolization resulting in substantial atrophy of the right lobe of the liver. 2. Generally unchanged appearance of a hypoenhancing mass in the peripheral right lobe following embolization, without convincing evidence of residual or recurrent contrast enhancement. 3. No new suspicious contrast enhancement in the liver. 4. No significant change in an enlarged hepatoduodenal or portacaval lymph node measuring 3.5 x 2.5 cm, likely a nodal metastasis. 5. Additional smaller, nonspecific porta hepatis and left retroperitoneal lymph nodes. Attention on follow-up. 6. Prostatomegaly with thickening of the urinary bladder wall and hyperenhancement of the mucosa. Correlate for urinalysis evidence of infectious or inflammatory cystitis   Aortic Atherosclerosis (ICD10-I70.0).   03/06/2022 Imaging   EXAM: CT ABDOMEN AND PELVIS WITH CONTRAST  IMPRESSION: 1. Cirrhotic morphology  of the liver. Unchanged appearance of a hypoenhancing mass  of the posterior right lobe of the liver without arterial hyperenhancement following radioembolization. The right portal vein remains occluded. 2. No new arterial hyperenhancement in the liver. 3. Significant interval enlargement of a bulky porta hepatis node, measuring 5.4 x 3.8 cm, previously 3.5 x 2.5 cm, consistent with worsened nodal metastatic disease. Unchanged smaller porta hepatis and left retroperitoneal lymph nodes, which remain nonspecific although suspicious for metastases. 4. Thickening and hyperenhancement of the urinary bladder, again suggestive of nonspecific infectious or inflammatory cystitis, although possibly related to chronic outlet obstruction. Correlate with urinalysis if clinically referable signs and symptoms are present.      INTERVAL HISTORY:  Jim Little is here for a follow up of [place]. He was last seen by me on 05/22/22. He presents to the clinic accompanied by an interpreter. He reports he is doing well except for heaviness in his butt. He notes his right leg is somewhat weak. He reports he walks some but gets tired after only 5-10 feet. He notes this has been the same for a while; he does not attribute it to the lenvatinib. He notes he has not taken synthroid for about 3 weeks. He also reports some pain to his mid/upper back.   All other systems were reviewed with the patient and are negative.  MEDICAL HISTORY:  Past Medical History:  Diagnosis Date   BPH (benign prostatic hyperplasia)    Cirrhosis ([place])    Hypertension    Splenomegaly     SURGICAL HISTORY: Past Surgical History:  Procedure Laterality Date   BIOPSY  08/22/2020   Procedure: BIOPSY;  Surgeon: Ronnette Juniper, MD;  Location: [facility];  Service: Gastroenterology;;   COLONOSCOPY WITH PROPOFOL N/A 08/22/2020   Procedure: COLONOSCOPY WITH PROPOFOL;  Surgeon: Ronnette Juniper, MD;  Location: [facility];  Service: Gastroenterology;  Laterality: N/A;   ESOPHAGOGASTRODUODENOSCOPY (EGD) WITH  PROPOFOL N/A 08/22/2020   Procedure: ESOPHAGOGASTRODUODENOSCOPY (EGD) WITH PROPOFOL;  Surgeon: Ronnette Juniper, MD;  Location: [facility];  Service: Gastroenterology;  Laterality: N/A;   IR ANGIOGRAM SELECTIVE EACH ADDITIONAL VESSEL  03/01/2021   IR ANGIOGRAM SELECTIVE EACH ADDITIONAL VESSEL  03/08/2021   IR ANGIOGRAM VISCERAL SELECTIVE  03/01/2021   IR ANGIOGRAM VISCERAL SELECTIVE  03/01/2021   IR ANGIOGRAM VISCERAL SELECTIVE  03/08/2021   IR EMBO TUMOR ORGAN ISCHEMIA INFARCT INC GUIDE ROADMAPPING  03/08/2021   IR RADIOLOGIST EVAL & MGMT  02/06/2021   IR RADIOLOGIST EVAL & MGMT  05/31/2021   IR US GUIDE VASC ACCESS RIGHT  03/01/2021   IR US GUIDE VASC ACCESS RIGHT  03/08/2021   UPPER GASTROINTESTINAL ENDOSCOPY  03/2018   [place] --candidiasis plus esophageal varices grade 2.  Treated with fluconazole and status post esophageal banding.   US ECHOCARDIOGRAPHY  10/2016   per overseas records from [place] - LVEF 68%; mildly calcified aortic valve, normal function otherwise    I have reviewed the social history and family history with the patient and they are unchanged from previous note.  ALLERGIES:  is allergic to pork-derived products.  MEDICATIONS:  Current Outpatient Medications  Medication Sig Dispense Refill   amLODipine (NORVASC) 10 MG tablet Take 1 tablet (10 mg total) by mouth daily. 90 tablet 3   Bismuth/Metronidaz/Tetracyclin (PYLERA) 140-125-125 MG CAPS Take 3 tablets by mouth in the morning, at noon, in the evening, and at bedtime. 120 capsule 0   diclofenac Sodium (VOLTAREN) 1 % GEL Apply to L shoulder area 350 g 1  folic acid (FOLVITE) 1 MG tablet Take 1 tablet by mouth daily. 30 tablet 0   lactulose (KRISTALOSE) 10 g packet TAKE 1 PACKET (10 G TOTAL) BY MOUTH AS NEEDED (CONSTIPATION). DISSOLVE IN EIGHT OUNCES OF WATER (Patient not taking: Reported on 04/24/2022) 30 each 1   lenvatinib 12 mg daily dose (LENVIMA, 12 MG DAILY DOSE,) 3 x 4 MG capsule Take 12 mg by mouth daily. Take  as instructed by MD 90 capsule 1   levothyroxine (SYNTHROID) 88 MCG tablet Take 1 tablet (88 mcg total) by mouth every morning. 30 minutes before food 60 tablet 1   Olopatadine HCl (PAZEO) 0.7 % SOLN Use once daily in both eyes (Patient not taking: Reported on 04/24/2022) 2.5 mL 0   omeprazole (PRILOSEC) 40 MG capsule Take 1 capsule (40 mg total) by mouth 2 (two) times daily. 60 capsule 0   tamsulosin (FLOMAX) 0.4 MG CAPS capsule Take 1 capsule (0.4 mg total) by mouth daily. Please pack and mail to home (Patient not taking: Reported on 05/12/2022) 90 capsule 3   No current facility-administered medications for this visit.   Facility-Administered Medications Ordered in Other Visits  Medication Dose Route Frequency Provider Last Rate Last Admin   sodium chloride flush (NS) 0.9 % injection 10 mL  10 mL Intracatheter PRN Truitt Merle, MD   10 mL at 09/19/21 1604    PHYSICAL EXAMINATION: ECOG PERFORMANCE STATUS: 3 - Symptomatic, >50% confined to bed  Vitals:   06/20/22 1350  BP: (!) 142/74  Pulse: 75  Resp: 18  Temp: 98.5 F (36.9 C)  SpO2: 99%   Wt Readings from Last 3 Encounters:  06/20/22 149 lb 6.4 oz (67.8 kg)  05/23/22 149 lb (67.6 kg)  05/22/22 150 lb 6.4 oz (68.2 kg)     GENERAL:alert, no distress and comfortable SKIN: skin color, texture, turgor are normal, no rashes or significant lesions EYES: normal, Conjunctiva are pink and non-injected, sclera clear LUNGS: clear to auscultation and percussion with normal breathing effort HEART: regular rate & rhythm and no murmurs and no lower extremity edema NEURO: alert & oriented x 3 with fluent speech, no focal motor/sensory deficits  LABORATORY DATA:  I have reviewed the data as listed    Latest Ref Rng & Units 06/20/2022    1:13 PM 05/22/2022    9:50 AM 05/15/2022    6:36 AM  CBC  WBC 4.0 - 10.5 K/uL 2.8  2.7  4.3   Hemoglobin 13.0 - 17.0 g/dL 12.1  13.1  11.3   Hematocrit 39.0 - 52.0 % 35.0  38.0  34.4   Platelets 150 - 400  K/uL 86  151  113         Latest Ref Rng & Units 06/20/2022    1:13 PM 05/23/2022   10:05 AM 05/22/2022    9:50 AM  CMP  Glucose 70 - 99 mg/dL 118   113   BUN 8 - 23 mg/dL 13   9   Creatinine 0.61 - 1.24 mg/dL 0.74   0.62   Sodium 135 - 145 mmol/L 138   135   Potassium 3.5 - 5.1 mmol/L 3.9   4.1   Chloride 98 - 111 mmol/L 108   104   CO2 22 - 32 mmol/L 26   25   Calcium 8.9 - 10.3 mg/dL 9.3   9.5   Total Protein 6.5 - 8.1 g/dL 8.3  8.6  8.8   Total Bilirubin 0.3 - 1.2 mg/dL 0.8  0.8  0.8   Alkaline Phos 38 - 126 U/L 141  182  164   AST 15 - 41 U/L 67  71  79   ALT 0 - 44 U/L 43  44  48       RADIOGRAPHIC STUDIES: I have personally reviewed the radiological images as listed and agreed with the findings in the report. No results found.    Orders Placed This Encounter  Procedures   CT ABDOMEN PELVIS W CONTRAST    Standing Status:   Future    Standing Expiration Date:   06/21/2023    Order Specific Question:   If indicated for the ordered procedure, I authorize the administration of contrast media per Radiology protocol    Answer:   Yes    Order Specific Question:   Preferred imaging location?    Answer:   [facility]    Order Specific Question:   Release to patient    Answer:   Immediate    Order Specific Question:   Is Oral Contrast requested for this exam?    Answer:   Yes, Per Radiology protocol   TSH    Standing Status:   Standing    Number of Occurrences:   10    Standing Expiration Date:   06/21/2023   T4, free    Standing Status:   Standing    Number of Occurrences:   10    Standing Expiration Date:   06/21/2023   All questions were answered. The patient knows to call the clinic with any problems, questions or concerns. No barriers to learning was detected. The total time spent in the appointment was 30 minutes.     Truitt Merle, MD 06/20/2022   I, Wilburn Mylar, am acting as scribe for Truitt Merle, MD.   I have reviewed the above documentation for  accuracy and completeness, and I agree with the above.

## 2022-06-20 NOTE — Progress Notes (Signed)
Received referral from provider regarding patient with concerns of bills received. Provider to have interpreter contact me directly with patient present.  Interpreter called and states patient has received some bills and has some questions. Asked interpreter to ask patient if they still have Medicaid and she states yes. Asked if patient has bills present and she said no. Advised interpreter once patient has bill present, to have patient contact number on the bill with interpreter present to confirm Medicaid coverage is being billed for that service and determine where balance may be coming from. She verbalized understanding.  They have my direct contact name and number for any additional financial questions or concerns. I will also send message to [facility] for follow up.

## 2022-06-20 NOTE — Telephone Encounter (Signed)
Patient called with appointment reminder.  [name] Kenika Sahm RN BSN [place] Nurse [phone]

## 2022-06-21 ENCOUNTER — Other Ambulatory Visit: Payer: Self-pay

## 2022-06-21 LAB — AFP TUMOR MARKER: AFP, Serum, Tumor Marker: 2996 ng/mL — ABNORMAL HIGH (ref 0.0–8.4)

## 2022-06-24 ENCOUNTER — Other Ambulatory Visit: Payer: Medicaid Other

## 2022-06-24 DIAGNOSIS — Z515 Encounter for palliative care: Secondary | ICD-10-CM

## 2022-06-25 ENCOUNTER — Other Ambulatory Visit (HOSPITAL_COMMUNITY): Payer: Self-pay

## 2022-06-25 NOTE — Progress Notes (Signed)
COMMUNITY PALLIATIVE CARE SW NOTE  PATIENT NAME: Elwood Bazinet DOB: 02/28/1946 MRN: [id number]  PRIMARY CARE PROVIDER: [name], MD  RESPONSIBLE PARTY:  Acct ID - Guarantor Home Phone Work Phone Relationship Acct Type  [id number] VERL, KITSON [phone]  Self P/F     [address] [place], [place] [address]   SOCIAL WORK TELEPHONE   PC SW completed a follow-up call with patient PCG/translator-[name] to assess financial needs or concerns. [name] advised that she has been working closely with patient and currently there are no financial needs at this time. Patient is on medicaid and is receiving public assistance. He has extended family member living in the home with him that assist with the financial up keep of the home. [name] advised that she was not aware of any needs patient had at this time, but would follow-up with him. She will contact SW for additional needs, questions or concerns.    [address] [place], [place]

## 2022-06-26 ENCOUNTER — Telehealth: Payer: Self-pay

## 2022-06-26 ENCOUNTER — Other Ambulatory Visit: Payer: Self-pay

## 2022-06-26 NOTE — Telephone Encounter (Signed)
Called patients with appointment reminders for 08-21 and 08-30.  [name] Armend Hochstatter RN BSN [place] Nurse [phone]

## 2022-07-01 ENCOUNTER — Other Ambulatory Visit (HOSPITAL_COMMUNITY): Payer: Self-pay

## 2022-07-02 ENCOUNTER — Telehealth: Payer: Self-pay

## 2022-07-02 NOTE — Telephone Encounter (Signed)
Patient stated that he has medical bills needing assistance. Upon review, patient has $4 co pays for his doctor visits. Patient is willing to pay this using his credit card. I will assist patient to make these payments using his credit card. His wife has started working at the [employer] and patient receives SSI check monthly.His wife and step sons are concerned that he is sending "all his money" to extended family members in [place]. I will continue to assist as needed.  [name] Ashna Dorough RN BSN [place] Nurse [phone]

## 2022-07-09 ENCOUNTER — Telehealth: Payer: Self-pay

## 2022-07-09 NOTE — Telephone Encounter (Signed)
Transportation assistance schedule for the 2 oncoming appointments.  [name] Dimple Bastyr RN BSN [place] Nurse [phone]

## 2022-07-11 ENCOUNTER — Other Ambulatory Visit (HOSPITAL_COMMUNITY): Payer: Self-pay

## 2022-07-12 ENCOUNTER — Telehealth: Payer: Self-pay

## 2022-07-12 NOTE — Telephone Encounter (Signed)
Patient informed of CT scan appointment. Education provided on contrast oral intake.  [name] Shalaina Guardiola RN BSN [place] Nurse [phone]

## 2022-07-15 ENCOUNTER — Inpatient Hospital Stay: Payer: Medicaid Other

## 2022-07-16 ENCOUNTER — Other Ambulatory Visit (HOSPITAL_COMMUNITY): Payer: Self-pay

## 2022-07-16 ENCOUNTER — Other Ambulatory Visit: Payer: Self-pay | Admitting: *Deleted

## 2022-07-16 MED ORDER — DICLOFENAC SODIUM 1 % EX GEL: CUTANEOUS | 1 refills | Status: DC

## 2022-07-17 NOTE — Congregational Nurse Program (Signed)
  Dept: [phone]   Congregational Nurse Program Note  Date of Encounter: 07/17/2022  Past Medical History: Past Medical History:  Diagnosis Date   BPH (benign prostatic hyperplasia)    Cirrhosis ([place])    Hypertension    Splenomegaly     Encounter Details:  CNP Questionnaire - 07/16/22 0921       Questionnaire   Do you give verbal consent to treat you today? Yes    Location Patient Served  [place]    Visit Setting Home    Patient Status Refugee    Insurance [facility]    Insurance Referral Medicaid    Medication Have Medication Insecurities;Provided Medication Assistance    Medical Provider Yes    Screening Referrals N/A    Medical Referral N/A    Medical Appointment Made Other    Food N/A    Transportation Need transportation assistance;Provided transportation assistance;Referred to transportation service    Housing/Utilities N/A    Interpersonal Safety N/A    Intervention Advocate;Navigate Healthcare System;Case Management;Counsel;Educate;Support    ED Visit Averted Yes    Life-Saving Intervention Made N/A            07/16/22 Home visit completed.Medication inspected. Patient has unopened medication Amlodipine, Flomax and Synthriod. Assisted to pack Amlodipine,Flomax and Folic acid  medications in pill boxes to take daily in the evening. Instructed to take synthroid in the mornings before breakfast.Patient informed me that he has run out of chemotherapy drug. Contacted specialty pharmacy, informed that patient should still have 8 days for medication remaining. Delivery set for next week. Ride scheduled for [facility] visit on 07/17/22.  [name] Gabbrielle Mcnicholas RN BSN [place] Nurse [phone]

## 2022-07-18 ENCOUNTER — Encounter: Payer: Self-pay | Admitting: Family Medicine

## 2022-07-18 ENCOUNTER — Other Ambulatory Visit (HOSPITAL_COMMUNITY): Payer: Self-pay

## 2022-07-18 DIAGNOSIS — N35919 Unspecified urethral stricture, male, unspecified site: Secondary | ICD-10-CM | POA: Insufficient documentation

## 2022-07-19 ENCOUNTER — Ambulatory Visit (HOSPITAL_COMMUNITY): Payer: Medicaid Other

## 2022-07-19 ENCOUNTER — Other Ambulatory Visit: Payer: Self-pay

## 2022-07-19 ENCOUNTER — Inpatient Hospital Stay: Payer: Medicaid Other | Attending: Hematology

## 2022-07-20 ENCOUNTER — Other Ambulatory Visit: Payer: Self-pay

## 2022-07-23 ENCOUNTER — Telehealth: Payer: Self-pay

## 2022-07-23 NOTE — Telephone Encounter (Signed)
Patient called with appointment reminder.Transportation scheduled.  [name] Blakely Gluth RN BSN [place] Nurse [phone]

## 2022-07-24 ENCOUNTER — Inpatient Hospital Stay: Payer: Medicaid Other | Admitting: Hematology

## 2022-07-24 ENCOUNTER — Telehealth: Payer: Self-pay

## 2022-07-24 NOTE — Telephone Encounter (Signed)
Patient reminded of radiology appointment sheduled for 07/25/2022 at 2 pm at [place] dept.   Also, patient informed that today`s appointment  with oncology will be rescheduled to later date after the CT scan is done.  [name] Shontia Gillooly RN BSN [place] Nurse [phone]

## 2022-07-25 ENCOUNTER — Ambulatory Visit (HOSPITAL_COMMUNITY)
Admission: RE | Admit: 2022-07-25 | Discharge: 2022-07-25 | Disposition: A | Discharge status: Home / Self Care | Payer: Medicaid Other | Source: Ambulatory Visit | Attending: Hematology | Admitting: Hematology

## 2022-07-25 ENCOUNTER — Telehealth: Payer: Self-pay | Admitting: Hematology

## 2022-07-25 DIAGNOSIS — C220 Liver cell carcinoma: Secondary | ICD-10-CM | POA: Insufficient documentation

## 2022-07-25 MED ORDER — IOHEXOL 300 MG/ML  SOLN
100.0000 mL | Freq: Once | INTRAMUSCULAR | Status: AC | PRN
Start: 2022-07-25 — End: 2022-07-25
  Administered 2022-07-25: 100 mL via INTRAVENOUS

## 2022-07-25 NOTE — Telephone Encounter (Signed)
Contacted patient to scheduled appointments. Left message with appointment details and a call back number if patient had any questions or could not accommodate the time we provided.   

## 2022-07-26 ENCOUNTER — Other Ambulatory Visit: Payer: Self-pay

## 2022-07-31 ENCOUNTER — Telehealth: Payer: Self-pay

## 2022-07-31 ENCOUNTER — Other Ambulatory Visit: Payer: Self-pay

## 2022-07-31 ENCOUNTER — Other Ambulatory Visit (HOSPITAL_COMMUNITY): Payer: Self-pay

## 2022-07-31 MED ORDER — LACTULOSE 10 GM/15ML PO SOLN
10.0000 g | Freq: Every day | ORAL | 1 refills | Status: DC | PRN
  Filled 2022-07-31: qty 450, 30d supply, fill #0
  Filled 2022-10-09: qty 450, 30d supply, fill #1

## 2022-07-31 NOTE — Progress Notes (Signed)
Jim Little called stating that the pt is there in their pharmacy requesting a refill for his Lactulose 10gm packets.  The pharmacist is asking for a new prescription d/t they do not stock the packets of Lactulose.  Pharmacist stated they stock the solution.  Pharmacist also stated that they pt is getting a grant to assist with his medications therefore his prescriptions are being filled at [facility].  Dr. Burr Medico gave verbal order for Lactulose 10gm/65m PRN daily 4553m  [facility]

## 2022-07-31 NOTE — Telephone Encounter (Signed)
Patient called with appointment reminder. Transportation scheduled with [employer] for September 8th.  [name] Cordell Coke RN BSN [place] Nurse [phone]

## 2022-07-31 NOTE — Telephone Encounter (Signed)
Patient is c/o constipation, has run out of lactulose. I have called for refills. Will pick up and deliver to home.  [name] Martita Brumm RN BSN [place] Nurse [phone]

## 2022-08-02 ENCOUNTER — Inpatient Hospital Stay: Payer: Medicaid Other | Admitting: Hematology

## 2022-08-02 ENCOUNTER — Emergency Department (HOSPITAL_COMMUNITY)
Admission: EM | Admit: 2022-08-02 | Discharge: 2022-08-02 | Disposition: A | Discharge status: Home / Self Care | Payer: Medicaid Other | Attending: Emergency Medicine | Admitting: Emergency Medicine

## 2022-08-02 ENCOUNTER — Inpatient Hospital Stay: Payer: Medicaid Other | Attending: Hematology

## 2022-08-02 ENCOUNTER — Emergency Department (HOSPITAL_COMMUNITY): Payer: Medicaid Other

## 2022-08-02 ENCOUNTER — Telehealth (HOSPITAL_COMMUNITY): Payer: Self-pay | Admitting: Emergency Medicine

## 2022-08-02 ENCOUNTER — Encounter (HOSPITAL_COMMUNITY): Payer: Self-pay

## 2022-08-02 ENCOUNTER — Other Ambulatory Visit (HOSPITAL_COMMUNITY): Payer: Self-pay

## 2022-08-02 ENCOUNTER — Telehealth: Payer: Self-pay

## 2022-08-02 ENCOUNTER — Other Ambulatory Visit: Payer: Self-pay

## 2022-08-02 DIAGNOSIS — N390 Urinary tract infection, site not specified: Secondary | ICD-10-CM | POA: Diagnosis not present

## 2022-08-02 DIAGNOSIS — U071 COVID-19: Secondary | ICD-10-CM | POA: Insufficient documentation

## 2022-08-02 DIAGNOSIS — R509 Fever, unspecified: Secondary | ICD-10-CM | POA: Diagnosis present

## 2022-08-02 LAB — CBC WITH DIFFERENTIAL/PLATELET
Abs Immature Granulocytes: 0.01 10*3/uL (ref 0.00–0.07)
Basophils Absolute: 0 10*3/uL (ref 0.0–0.1)
Basophils Relative: 0 %
Eosinophils Absolute: 0 10*3/uL (ref 0.0–0.5)
Eosinophils Relative: 1 %
HCT: 36.6 % — ABNORMAL LOW (ref 39.0–52.0)
Hemoglobin: 12.3 g/dL — ABNORMAL LOW (ref 13.0–17.0)
Immature Granulocytes: 0 %
Lymphocytes Relative: 12 %
Lymphs Abs: 0.4 10*3/uL — ABNORMAL LOW (ref 0.7–4.0)
MCH: 35.1 pg — ABNORMAL HIGH (ref 26.0–34.0)
MCHC: 33.6 g/dL (ref 30.0–36.0)
MCV: 104.6 fL — ABNORMAL HIGH (ref 80.0–100.0)
Monocytes Absolute: 0.8 10*3/uL (ref 0.1–1.0)
Monocytes Relative: 25 %
Neutro Abs: 2 10*3/uL (ref 1.7–7.7)
Neutrophils Relative %: 62 %
Platelets: 93 10*3/uL — ABNORMAL LOW (ref 150–400)
RBC: 3.5 MIL/uL — ABNORMAL LOW (ref 4.22–5.81)
RDW: 13.9 % (ref 11.5–15.5)
WBC: 3.1 10*3/uL — ABNORMAL LOW (ref 4.0–10.5)
nRBC: 0 % (ref 0.0–0.2)

## 2022-08-02 LAB — LIPASE, BLOOD: Lipase: 23 U/L (ref 11–51)

## 2022-08-02 LAB — URINALYSIS, ROUTINE W REFLEX MICROSCOPIC
Bilirubin Urine: NEGATIVE
Glucose, UA: NEGATIVE mg/dL
Ketones, ur: NEGATIVE mg/dL
Nitrite: POSITIVE — AB
Protein, ur: 100 mg/dL — AB
Specific Gravity, Urine: 1.023 (ref 1.005–1.030)
WBC, UA: >50 WBC/hpf — ABNORMAL HIGH (ref 0–5)
pH: 6 (ref 5.0–8.0)

## 2022-08-02 LAB — COMPREHENSIVE METABOLIC PANEL
ALT: 51 U/L — ABNORMAL HIGH (ref 0–44)
AST: 80 U/L — ABNORMAL HIGH (ref 15–41)
Albumin: 3.4 g/dL — ABNORMAL LOW (ref 3.5–5.0)
Alkaline Phosphatase: 137 U/L — ABNORMAL HIGH (ref 38–126)
Anion gap: 4 — ABNORMAL LOW (ref 5–15)
BUN: 11 mg/dL (ref 8–23)
CO2: 24 mmol/L (ref 22–32)
Calcium: 8.9 mg/dL (ref 8.9–10.3)
Chloride: 107 mmol/L (ref 98–111)
Creatinine, Ser: 0.84 mg/dL (ref 0.61–1.24)
GFR, Estimated: >60 mL/min (ref >60)
Glucose, Bld: 100 mg/dL — ABNORMAL HIGH (ref 70–99)
Potassium: 4.1 mmol/L (ref 3.5–5.1)
Sodium: 135 mmol/L (ref 135–145)
Total Bilirubin: 0.7 mg/dL (ref 0.3–1.2)
Total Protein: 7.9 g/dL (ref 6.5–8.1)

## 2022-08-02 LAB — RESP PANEL BY RT-PCR (FLU A&B, COVID) ARPGX2
Influenza A by PCR: NEGATIVE
Influenza B by PCR: NEGATIVE
SARS Coronavirus 2 by RT PCR: POSITIVE — AB

## 2022-08-02 LAB — PROTIME-INR
INR: 1.3 — ABNORMAL HIGH (ref 0.8–1.2)
Prothrombin Time: 16.4 seconds — ABNORMAL HIGH (ref 11.4–15.2)

## 2022-08-02 LAB — LACTIC ACID, PLASMA: Lactic Acid, Venous: 1 mmol/L (ref 0.5–1.9)

## 2022-08-02 MED ORDER — NIRMATRELVIR/RITONAVIR (PAXLOVID)TABLET
3.0000 | ORAL_TABLET | Freq: Two times a day (BID) | ORAL | 0 refills | Status: AC
  Filled 2022-08-02 – 2022-08-03 (×2): qty 30, 5d supply, fill #0

## 2022-08-02 MED ORDER — NITROFURANTOIN MONOHYD MACRO 100 MG PO CAPS
100.0000 mg | ORAL_CAPSULE | Freq: Once | ORAL | Status: AC
  Administered 2022-08-02: 100 mg via ORAL
  Filled 2022-08-02: qty 1

## 2022-08-02 MED ORDER — NITROFURANTOIN MONOHYD MACRO 100 MG PO CAPS: 100.0000 mg | ORAL_CAPSULE | Freq: Two times a day (BID) | ORAL | 0 refills | Status: DC

## 2022-08-02 NOTE — ED Provider Notes (Signed)
[facility] DEPT Provider Note   CSN: [id number] Arrival date & time: 08/02/22  8563     History  Chief Complaint  Patient presents with   Fever    Deagen Krass is a 76 y.o. male.  Patient is a 76 year old male with a past medical history of [place] and cirrhosis status post completion of chemotherapy presenting to the emergency department with fever.  The patient states that he has had subjective fevers at home for the past 4 days.  He states that he was seen at the oncology clinic this morning and his temperature was 100 F and they recommended that he come to the emergency department for further evaluation.  He states that he has had associated body aches and a mild sore throat.  He denies any cough or congestion, chest pain or shortness of breath.  He denies any abdominal pain but reports early satiety.  He denies any nausea, vomiting, diarrhea or constipation.  He states he has had multiple UTIs in the past but states he had a stent placed and has had no problems since then and denies any current dysuria or hematuria.  He denies any rash.  He denies any known sick contacts.  The history is provided by the patient. A language interpreter was used Warden/ranger).  Fever      Home Medications Prior to Admission medications   Medication Sig Start Date End Date Taking? Authorizing Provider  amLODipine (NORVASC) 10 MG tablet Take 1 tablet (10 mg total) by mouth daily. 03/15/22   [name], MD  Bismuth/Metronidaz/Tetracyclin [facility]) [phone] MG CAPS Take 3 tablets by mouth in the morning, at noon, in the evening, and at bedtime. 05/23/22   [name], MD  diclofenac Sodium (VOLTAREN) 1 % GEL Apply to L shoulder area 07/16/22   [name], MD  folic acid (FOLVITE) 1 MG tablet Take 1 tablet by mouth daily. 05/17/22   Florencia Reasons, MD  lactulose (CHRONULAC) 10 GM/15ML solution Take 15 mLs (10 g total) by mouth daily as needed for severe constipation or  moderate constipation. 07/31/22   Truitt Merle, MD  lenvatinib 12 mg daily dose (LENVIMA, 12 MG DAILY DOSE,) 3 x 4 MG capsule Take 12 mg by mouth daily. Take as instructed by MD 05/22/22   Truitt Merle, MD  levothyroxine (SYNTHROID) 88 MCG tablet Take 1 tablet (88 mcg total) by mouth every morning. 30 minutes before food 05/23/22   [name], MD  Olopatadine HCl (PAZEO) 0.7 % SOLN Use once daily in both eyes Patient not taking: Reported on 04/24/2022 11/01/21   [name], MD  omeprazole (PRILOSEC) 40 MG capsule Take 1 capsule (40 mg total) by mouth 2 (two) times daily. 05/23/22   [name], MD  tamsulosin (FLOMAX) 0.4 MG CAPS capsule Take 1 capsule (0.4 mg total) by mouth daily. Please pack and mail to home Patient not taking: Reported on 05/12/2022 01/31/21   [name], MD      Allergies    Pork-derived products    Review of Systems   Review of Systems  Constitutional:  Positive for fever.    Physical Exam Updated Vital Signs BP (!) 176/98 (BP Location: Left Arm)   Pulse 76   Temp 98.8 F (37.1 C) (Oral)   Resp 18   SpO2 99%  Physical Exam Vitals and nursing note reviewed.  Constitutional:      General: He is not in acute distress.  Appearance: Normal appearance.  HENT:     Head: Normocephalic and atraumatic.     Nose: Nose normal.     Mouth/Throat:     Mouth: Mucous membranes are moist.     Pharynx: Oropharynx is clear.     Comments: No tonsillar swelling or exudates, uvula midline, normal phonation, no trismus Eyes:     Extraocular Movements: Extraocular movements intact.     Conjunctiva/sclera: Conjunctivae normal.     Pupils: Pupils are equal, round, and reactive to light.  Cardiovascular:     Rate and Rhythm: Normal rate and regular rhythm.     Pulses: Normal pulses.     Heart sounds: Normal heart sounds.  Pulmonary:     Effort: Pulmonary effort is normal.     Breath sounds: Normal breath sounds.  Abdominal:     General: There is distension (mild,  palpable fluid wave).     Palpations: Abdomen is soft.     Tenderness: There is no abdominal tenderness. There is no right CVA tenderness or left CVA tenderness.  Musculoskeletal:        General: Normal range of motion.     Cervical back: Normal range of motion and neck supple.     Right lower leg: No edema.     Left lower leg: No edema.  Skin:    General: Skin is warm and dry.     Coloration: Skin is not jaundiced.  Neurological:     General: No focal deficit present.     Mental Status: He is alert and oriented to person, place, and time.  Psychiatric:        Mood and Affect: Mood normal.        Behavior: Behavior normal.     ED Results / Procedures / Treatments   Labs (all labs ordered are listed, but only abnormal results are displayed) Labs Reviewed - No data to display  EKG None  Radiology No results found.  Procedures Procedures    Medications Ordered in ED Medications - No data to display  ED Course/ Medical Decision Making/ A&P Clinical Course as of 08/02/22 1228  Fri Aug 02, 2022  1018 Bedside ultrasound was performed and patient had no ascites on FAST exam making SBP unlikely.  Remainder of his work-up is pending at this time. [VK]  1225 Upon reevaluation, patient's urine is consistent with UTI with nitrites, leukocytes and bacteria.  Urine culture was sent as the patient has history of multidrug-resistant UTIs.  Previous urine cultures have been sensitive to Macrobid and the patient has been successfully treated with Macrobid in the last year.  He will be given his first dose here and a prescription to go home with.  He has no signs of sepsis or any complications from his UTI requiring admission.  He is not neutropenic.  Patient was given strict return precautions and all his questions were answered.  He was recommended follow-up with his primary doctor. [VK]    Clinical Course User Index [VK] Ottie Glazier, DO                           Medical  Decision Making This patient presents to the ED with chief complaint(s) of fever and body aches with pertinent past medical history of [place] and cirrhosis which further complicates the presenting complaint. The complaint involves an extensive differential diagnosis and also carries with it a high risk of complications and morbidity.  The differential diagnosis includes sepsis, from possible pneumonia, UTI, SBP, COVID.  The patient has no point tenderness in his abdomen making appendicitis, Coley cystitis or diverticulitis less likely, he has no CVA tenderness making pyelonephritis less likely  Additional history obtained: Additional history obtained from N/A Records reviewed outpatient oncology and palliative care records  ED Course and Reassessment: Upon patient's arrival here he is afebrile though reportedly took Tylenol prior to arrival and is otherwise well-appearing.  He has no obvious source of infection on his exam.  He will have a sepsis work-up initiated including chest x-ray, urine and lab work and will have bedside ultrasound to evaluate for ascites and possible SBP.  Independent labs interpretation:  The following labs were independently interpreted: UA positive for UTI, no signs of neutropenia or sepsis  Independent visualization of imaging: - I independently visualized the following imaging with scope of interpretation limited to determining acute life threatening conditions related to emergency care: Bedside ultrasound, which revealed no ascites  Consultation: - Consulted or discussed management/test interpretation w/ external professional: N/A  Consideration for admission or further workup: Patient has no emergent conditions that require admission at this time.  He is stable for discharge home with outpatient primary care follow-up and was given strict return precautions Social Determinants of health: N/A    Amount and/or Complexity of Data Reviewed Labs:  ordered. Radiology: ordered.  Risk Prescription drug management.          Final Clinical Impression(s) / ED Diagnoses Final diagnoses:  None    Rx / DC Orders ED Discharge Orders     None         Ottie Glazier, DO 08/02/22 1229

## 2022-08-02 NOTE — Telephone Encounter (Signed)
[facility] contacted and confirmed that medication will be delivered to patient`s house today.  [name] Cashay Manganelli RN BSN [facility] Nurse [phone]

## 2022-08-02 NOTE — ED Notes (Signed)
Interpreter for this patient had to leave for another appointment and will call when she is done to see if we still need her, Jocelyne [phone].

## 2022-08-02 NOTE — ED Notes (Addendum)
Patient's Congregational Nurse, Jim Little, called to arrange transport for patient at discharge. Reports patient is to wait outside for arranged taxi and she will contact him with specific details.

## 2022-08-02 NOTE — Telephone Encounter (Signed)
Patient's COVID is positive. Sending prescription of Paxlovid to pharmacy.

## 2022-08-02 NOTE — ED Triage Notes (Signed)
Pt arrived via wheelchair from the cancer center. Went to treatment today, told nurse he had a fever last night and sent to ED for further eval. Pt states fevers since yesterday, also endorses generalized weakness and pain.    Interpreter at bedside.

## 2022-08-02 NOTE — Discharge Instructions (Addendum)
You were seen in the emergency department for your fevers.  Your work-up shows that you have a urinary tract infection.  I have given you a prescription of antibiotics and you should complete this as prescribed.  You should follow-up with your primary doctor in the next few days to have your symptoms rechecked.  We did send a urine culture so if this grows a bacteria resistant to the antibiotic we gave you you will receive a call for change in antibiotics.  You should return to the emergency department if you have any worsening abdominal or back pain, you are vomiting and cannot tolerate the antibiotics, you are still having fevers despite taking the antibiotics or if you have any other new or concerning symptoms.  Maxie Barb idara ya dharura kwa homa zako. Uchunguzi wako unaonyesha kuwa una maambukizi ya njia ya mkojo. Nimekupa maagizo ya antibiotics na unapaswa kukamilisha hili kama ilivyoagizwa. Unapaswa kufuatilia kwa daktari wako wa msingi katika siku chache zijazo ili dalili zako ziangaliwe upya. [place] utamaduni wa mkojo kwa hivyo ikiwa hii itakua bakteria sugu kwa antibiotiki [place] utapokea simu ya [place] dawa. [name] Puffer kurudi kwa idara ya dharura ikiwa una maumivu yoyote ya tumbo au ya mgongo yanayozidi [place], [place] na hauwezi kustahimili viuavijasumu, bado una homa licha ya kutumia viuavijasumu au ikiwa una dalili zozote mpya au zinazohusiana.

## 2022-08-03 ENCOUNTER — Other Ambulatory Visit (HOSPITAL_COMMUNITY): Payer: Self-pay

## 2022-08-03 ENCOUNTER — Telehealth: Payer: Self-pay

## 2022-08-03 NOTE — Telephone Encounter (Signed)
Experienced difficulties getting Paxlovid medication from [facility] outpatient pharmacy due to unavailability. Pres[facility] then [facility] outpatient pharmacy. Patient Son was able to pick up this medication today.  Patient advised to hold amlodipine and flomax by pharmacist and by PCP due to interactions. Patient educated.  [name] Lluvia Gwynne RN BSN [place] Nurse [phone]

## 2022-08-04 LAB — URINE CULTURE: Culture: >=100000 — AB

## 2022-08-05 ENCOUNTER — Telehealth: Payer: Self-pay | Admitting: Emergency Medicine

## 2022-08-05 NOTE — Telephone Encounter (Signed)
Post ED Visit - Positive Culture Follow-up  Culture report reviewed by antimicrobial stewardship pharmacist: [place] Team '[]'$  Elenor Quinones, Pharm.D. '[]'$  Heide Guile, Pharm.D., BCPS AQ-ID '[]'$  Parks Neptune, Pharm.D., BCPS '[x]'$  Alycia Rossetti, Pharm.D., BCPS '[]'$  [place], Pharm.D., BCPS, AAHIVP '[]'$  Legrand Como, Pharm.D., BCPS, AAHIVP '[]'$  Salome Arnt, PharmD, BCPS '[]'$  Johnnette Gourd, PharmD, BCPS '[]'$  Hughes Better, PharmD, BCPS '[]'$  Leeroy Cha, PharmD '[]'$  Laqueta Linden, PharmD, BCPS '[]'$  Albertina Parr, PharmD  [place] Team '[]'$  Leodis Sias, PharmD '[]'$  Lindell Spar, PharmD '[]'$  Royetta Asal, PharmD '[]'$  Graylin Shiver, Rph '[]'$  Rema Fendt) Glennon Mac, PharmD '[]'$  Arlyn Dunning, PharmD '[]'$  Netta Cedars, PharmD '[]'$  Dia Sitter, PharmD '[]'$  Leone Haven, PharmD '[]'$  Gretta Arab, PharmD '[]'$  Theodis Shove, PharmD '[]'$  Peggyann Juba, PharmD '[]'$  Reuel Boom, PharmD   Positive urine culture Treated with nitrofurantoin, organism sensitive to the same and no further patient follow-up is required at this time.  Hazle Nordmann 08/05/2022, 12:12 PM

## 2022-08-06 ENCOUNTER — Other Ambulatory Visit (HOSPITAL_COMMUNITY): Payer: Self-pay

## 2022-08-08 ENCOUNTER — Other Ambulatory Visit: Payer: Self-pay | Admitting: Diagnostic Radiology

## 2022-08-08 DIAGNOSIS — C220 Liver cell carcinoma: Secondary | ICD-10-CM

## 2022-08-13 ENCOUNTER — Telehealth: Payer: Self-pay

## 2022-08-13 NOTE — Telephone Encounter (Signed)
Transportation scheduled for MRI. Patient called and made aware.  [name] Rewa Weissberg RN BSN [place] [facility] Nurse [phone]

## 2022-08-14 NOTE — Congregational Nurse Program (Signed)
  Dept: [phone]   Congregational Nurse Program Note  Date of Encounter: 08/14/2022  Past Medical History: Past Medical History:  Diagnosis Date   BPH (benign prostatic hyperplasia)    Cirrhosis ([place])    Hypertension    Splenomegaly     Encounter Details:  CNP Questionnaire - 08/14/22 1029       Questionnaire   Do you give verbal consent to treat you today? Yes    Location Patient Served  [facility]    Visit Setting Home    Patient Status Refugee    Insurance Medicaid    Insurance Referral Medicaid    Medication Have Medication Insecurities;Provided Medication Assistance    Medical Provider Yes    Screening Referrals N/A    Medical Referral N/A    Medical Appointment Made N/A    Food N/A    Transportation Need transportation assistance;Provided transportation assistance;Referred to transportation service    Housing/Utilities N/A    Interpersonal Safety N/A    Intervention Advocate;Navigate Healthcare System;Case Management;Counsel;Educate;Support    ED Visit Averted Yes    Life-Saving Intervention Made N/A            08/13/22- 1300hrs Home visit completed. Medication inspected. Patient did not complete COVID treatment dose. Stated that me medication made him feel worse.Also, he is non compliance with cancer chemotherapy medication. He has medication remaining for about one month despite skipping a refill last month. Educated  on medication compliance. [place] for [employer] and Omeprazole refill.  [name] Chavon Lucarelli RN BSN [facility] [facility] Nurse [phone]

## 2022-08-28 ENCOUNTER — Ambulatory Visit (HOSPITAL_COMMUNITY)
Admission: RE | Admit: 2022-08-28 | Discharge: 2022-08-28 | Disposition: A | Discharge status: Home / Self Care | Payer: Medicaid Other | Source: Ambulatory Visit | Attending: Diagnostic Radiology | Admitting: Diagnostic Radiology

## 2022-08-28 ENCOUNTER — Telehealth: Payer: Self-pay

## 2022-08-28 DIAGNOSIS — C220 Liver cell carcinoma: Secondary | ICD-10-CM | POA: Diagnosis present

## 2022-08-28 MED ORDER — GADOPICLENOL 0.5 MMOL/ML IV SOLN
6.5000 mL | Freq: Once | INTRAVENOUS | Status: AC | PRN
  Administered 2022-08-28: 6.5 mL via INTRAVENOUS

## 2022-08-28 NOTE — Telephone Encounter (Signed)
Contacted patient with appointment reminder for today.  [name] RN BSN [place] Nurse [phone]

## 2022-08-29 ENCOUNTER — Other Ambulatory Visit (HOSPITAL_COMMUNITY): Payer: Self-pay

## 2022-09-02 ENCOUNTER — Other Ambulatory Visit (HOSPITAL_COMMUNITY): Payer: Self-pay

## 2022-09-03 ENCOUNTER — Ambulatory Visit
Admission: RE | Admit: 2022-09-03 | Discharge: 2022-09-03 | Disposition: A | Discharge status: Home / Self Care | Payer: Medicaid Other | Source: Ambulatory Visit | Attending: Diagnostic Radiology | Admitting: Diagnostic Radiology

## 2022-09-03 ENCOUNTER — Telehealth: Payer: Self-pay

## 2022-09-03 ENCOUNTER — Encounter: Payer: Self-pay | Admitting: *Deleted

## 2022-09-03 DIAGNOSIS — C220 Liver cell carcinoma: Secondary | ICD-10-CM

## 2022-09-03 HISTORY — PX: IR RADIOLOGIST EVAL & MGMT: IMG5224

## 2022-09-03 NOTE — Progress Notes (Signed)
Chief Complaint: Patient was seen in consultation today for follow-up hepatocellular carcinoma.  Referring Physician(s): Truitt Merle  History of Present Illness: Jim Little is a 76 y.o. 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  Patient underwent Y90 radioembolization of the right hepatic lobe on 03/08/2021.  I last saw the patient in follow-up on 05/31/2021.  Since the last IR visit, the patient has been getting follow-up with Dr. Burr Medico.  Patient was last seen in oncology clinic on 06/20/2022.  Patient is receiving systemic therapy and had radiation treatment to the metastatic lymph nodes.  Based on the last oncology note, the patient is taking lenvatinib 12 mg po daily. Patient is accompanied by [facility] interpreter.  Despite having an interpreter, it is still difficult to communicate with the patient.  Patient has no new complaints.  He does not ambulate well and uses a walker to get around the house.  He spends most of the time in a chair during the day.  He reports no new issues with his weight or appetite.  He denies chest or abdominal pain.  No change in his bowel or urinary habits.  Patient had a follow-up MRI on 08/28/2022 I reviewed the patient's epic chart and reviewed the recent and prior imaging.  Past Medical History:  Diagnosis Date   BPH (benign prostatic hyperplasia)    Cirrhosis ([place])    Hypertension    Splenomegaly     Past Surgical History:  Procedure Laterality Date   BIOPSY  08/22/2020   Procedure: BIOPSY;  Surgeon: Ronnette Juniper, MD;  Location: [facility];  Service: Gastroenterology;;   COLONOSCOPY WITH PROPOFOL N/A 08/22/2020   Procedure: COLONOSCOPY WITH PROPOFOL;  Surgeon: Ronnette Juniper, MD;  Location: [facility];  Service: Gastroenterology;  Laterality: N/A;   ESOPHAGOGASTRODUODENOSCOPY (EGD) WITH PROPOFOL N/A 08/22/2020   Procedure: ESOPHAGOGASTRODUODENOSCOPY (EGD) WITH PROPOFOL;   Surgeon: Ronnette Juniper, MD;  Location: [facility];  Service: Gastroenterology;  Laterality: N/A;   IR ANGIOGRAM SELECTIVE EACH ADDITIONAL VESSEL  03/01/2021   IR ANGIOGRAM SELECTIVE EACH ADDITIONAL VESSEL  03/08/2021   IR ANGIOGRAM VISCERAL SELECTIVE  03/01/2021   IR ANGIOGRAM VISCERAL SELECTIVE  03/01/2021   IR ANGIOGRAM VISCERAL SELECTIVE  03/08/2021   IR EMBO TUMOR ORGAN ISCHEMIA INFARCT INC GUIDE ROADMAPPING  03/08/2021   IR RADIOLOGIST EVAL & MGMT  02/06/2021   IR RADIOLOGIST EVAL & MGMT  05/31/2021   IR US GUIDE VASC ACCESS RIGHT  03/01/2021   IR US GUIDE VASC ACCESS RIGHT  03/08/2021   UPPER GASTROINTESTINAL ENDOSCOPY  03/2018   [place] --candidiasis plus esophageal varices grade 2.  Treated with fluconazole and status post esophageal banding.   US ECHOCARDIOGRAPHY  10/2016   per overseas records from [place] - LVEF 68%; mildly calcified aortic valve, normal function otherwise    Allergies: Pork-derived products  Medications: Prior to Admission medications   Medication Sig Start Date End Date Taking? Authorizing Provider  amLODipine (NORVASC) 10 MG tablet Take 1 tablet (10 mg total) by mouth daily. 03/15/22   [name], MD  Bismuth/Metronidaz/Tetracyclin [facility]) [phone] MG CAPS Take 3 tablets by mouth in the morning, at noon, in the evening, and at bedtime. 05/23/22   [name], MD  diclofenac Sodium (VOLTAREN) 1 % GEL Apply to L shoulder area 07/16/22   [name], MD  folic acid (FOLVITE) 1 MG tablet Take 1 tablet by mouth daily. 05/17/22   Erlinda Hong,  Annamaria Boots, MD  lactulose (CHRONULAC) 10 GM/15ML solution Take 15 mLs (10 g total) by mouth daily as needed for severe constipation or moderate constipation. 07/31/22   Truitt Merle, MD  lenvatinib 12 mg daily dose (LENVIMA, 12 MG DAILY DOSE,) 3 x 4 MG capsule Take 12 mg by mouth daily. Take as instructed by MD 05/22/22   Truitt Merle, MD  levothyroxine (SYNTHROID) 88 MCG tablet Take 1 tablet (88 mcg total) by mouth every morning. 30  minutes before food 05/23/22   [name], MD  nitrofurantoin, macrocrystal-monohydrate, (MACROBID) 100 MG capsule Take 1 capsule (100 mg total) by mouth 2 (two) times daily. 08/02/22   Leanord Asal K, DO  Olopatadine HCl (PAZEO) 0.7 % SOLN Use once daily in both eyes Patient not taking: Reported on 04/24/2022 11/01/21   [name], MD  omeprazole (PRILOSEC) 40 MG capsule Take 1 capsule (40 mg total) by mouth 2 (two) times daily. 05/23/22   [name], MD  tamsulosin (FLOMAX) 0.4 MG CAPS capsule Take 1 capsule (0.4 mg total) by mouth daily. Please pack and mail to home Patient not taking: Reported on 05/12/2022 01/31/21   [name], MD     Family History  Problem Relation Age of Onset   Heart disease Neg Hx     Social History   Socioeconomic History   Marital status: Married    Spouse name: Not on file   Number of children: 2   Years of education: Not on file   Highest education level: Not on file  Occupational History   Not on file  Tobacco Use   Smoking status: Never   Smokeless tobacco: Never  Vaping Use   Vaping Use: Never used  Substance and Sexual Activity   Alcohol use: Never   Drug use: Not Currently   Sexual activity: Not on file  Other Topics Concern   Not on file  Social History Narrative   Not on file   Social Determinants of Health   Financial Resource Strain: Not on file  Food Insecurity: Not on file  Transportation Needs: Not on file  Physical Activity: Not on file  Stress: Not on file  Social Connections: Not on file    ECOG Status: 2 - Symptomatic, <50% confined to bed   Review of Systems  Constitutional:  Negative for activity change and unexpected weight change.  Respiratory: Negative.    Cardiovascular: Negative.   Gastrointestinal: Negative.   Genitourinary: Negative.     Vital Signs: There were no vitals taken for this visit.   Physical Exam Constitutional:      Appearance: Normal appearance.  Cardiovascular:      Rate and Rhythm: Normal rate and regular rhythm.  Pulmonary:     Effort: Pulmonary effort is normal.     Breath sounds: Normal breath sounds.  Abdominal:     General: Abdomen is flat.     Palpations: Abdomen is soft.  Musculoskeletal:     Right lower leg: No edema.     Left lower leg: No edema.  Neurological:     Mental Status: He is alert.        Imaging: MR ABDOMEN WWO CONTRAST  Result Date: 08/29/2022 CLINICAL DATA:  Follow-up hepatocellular carcinoma, previous radioembolization of the right lobe EXAM: MRI ABDOMEN WITHOUT AND WITH CONTRAST TECHNIQUE: Multiplanar multisequence MR imaging of the abdomen was performed both before and after the administration of intravenous contrast. CONTRAST:  6.5 mL Vueway gadolinium contrast IV COMPARISON:  CT abdomen pelvis, 07/25/2022 FINDINGS: Examination is generally limited by extensive breath motion artifact throughout, particularly multiphasic contrast enhanced sequences. Lower chest: No acute abnormality. Hepatobiliary: Coarse, nodular cirrhotic morphology of the liver. No significant change in morphologic appearance of an ill-defined mass in the posterior right lobe of the liver, which is severely atrophic following treatment. There does however appear to be some diffuse, ill-defined contrast enhancement throughout the remnant right lobe of the liver, both on arterial phase and later contrast phases (series 12, image 29, series 16, image 29) which is not clearly focal to the known lesion. No arterial hyperenhancement is evident in the left lobe of the liver. A small hypoenhancing lesion seen by prior CT examinations in the posterior left lobe of the liver, hepatic segment II/III is not of fluid character on this examination however unchanged and hypoenhancing, measuring 0.9 x 0.8 cm (series 12, image 30). 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 No gallstones, gallbladder wall thickening, or biliary dilatation. Pancreas: Unremarkable.  No pancreatic ductal dilatation or surrounding inflammatory changes. Spleen: Normal in size without significant abnormality. Adrenals/Urinary Tract: Adrenal glands are unremarkable. Kidneys are normal, without renal calculi, solid lesion, or hydronephrosis. Stomach/Bowel: Stomach is within normal limits. No evidence of bowel wall thickening, distention, or inflammatory changes. Vascular/Lymphatic: No significant vascular findings are present. No significant change in a bulky, hypoenhancing portacaval lymph node measuring 4.1 x 3.7 cm (series 16, image 47). There is however significant interval enlargement of at least 1 left retroperitoneal lymph node, now measuring 2.5 x 2.2 cm, previously 1.4 x 1.3 cm (series 12, image 50). Other: No abdominal wall hernia or abnormality. No ascites. Musculoskeletal: No acute or significant osseous findings. IMPRESSION: 1. Today's MR examination is significantly limited by extensive breath motion artifact throughout, particularly multiphasic contrast enhanced sequences of primary interest. Given this limitation, multiphasic contrast enhanced CT is most likely the test of choice for future follow-up. 2. Within this limitation, no significant change in morphologic appearance of an ill-defined mass in the posterior right lobe of the liver, which is severely atrophic following treatment. There does appear to be some diffuse, ill-defined contrast enhancement throughout the remnant right lobe of the liver, both on arterial phase and later contrast phases which is not clearly focal to the known lesion and most likely reflects post treatment hyperemia. 3. A small hypoenhancing lesion seen by prior CT examinations in the posterior left lobe of the liver is unchanged and hypoenhancing, measuring 0.9 x 0.8 cm. This is not of fluid character on this examination (i.e. not likely a cyst as previously suggested), although incompletely characterized. Attention on follow-up. 4. Significant interval  enlargement of at least one left retroperitoneal lymph node, consistent with worsened nodal metastatic disease. Previously seen bulky portacaval node is unchanged. 5. Cirrhosis. Electronically Signed   By: Delanna Ahmadi M.D.   On: 08/29/2022 17:03    Labs:  CBC: Recent Labs    05/15/22 0636 05/22/22 0950 06/20/22 1313 08/02/22 0945  WBC 4.3 2.7* 2.8* 3.1*  HGB 11.3* 13.1 12.1* 12.3*  HCT 34.4* 38.0* 35.0* 36.6*  PLT 113* 151 86* 93*    COAGS: Recent Labs    09/19/21 1126 05/12/22 1628 08/02/22 0945  INR 1.2 1.2 1.3*    BMP: Recent Labs    05/15/22 0636 05/22/22 0950 06/20/22 1313 08/02/22 0945  NA 140 135 138 135  K 3.9 4.1 3.9 4.1  CL 109 104 108 107  CO2 '26 25 26 24  '$ GLUCOSE 105*  113* 118* 100*  BUN '9 9 13 11  '$ CALCIUM 8.6* 9.5 9.3 8.9  CREATININE 0.75 0.62 0.74 0.84  GFRNONAA >60 >60 >60 >60    LIVER FUNCTION TESTS: Recent Labs    05/22/22 0950 05/23/22 1005 06/20/22 1313 08/02/22 0945  BILITOT 0.8 0.8 0.8 0.7  AST 79* 71* 67* 80*  ALT 48* 44 43 51*  ALKPHOS 164* 182* 141* 137*  PROT 8.8* 8.6* 8.3* 7.9  ALBUMIN 3.9 3.9 3.7 3.4*    AFP 2996 on 06/20/2022 and AFP was 10,177 on 04/24/2022  Assessment and Plan:  76 year old with unresectable hepatocellular carcinoma with nodal metastasis.  The large infiltrative hepatic lesion with vascular invasion was treated with Y90 embolization on 03/08/2021 and the patient has been receiving systemic therapy and had radiation therapy to the nodal metastasis in the abdomen.  Most recent CT and MRI shows no significant progression of disease within the liver.  There has been significant regression of the large infiltrative lesion since the Y90 procedure.  However, the recent MRI clearly demonstrates progression of nodal disease in the left para-aortic retroperitoneal lymph nodes.  This is a significant change since the CT from 07/25/2022.  I reviewed the imaging findings with the patient.  At this time, there is no need  for liver directed therapy.  Patient's recent MRI is markedly limited due to respiratory motion and this is likely associated with the patient's inability to hold his breath and the language barrier.  Recommend follow-up imaging to be done with CT in order to decrease the motion artifact.  Patient needs to follow-up with Dr. Burr Medico due to the progressive nodal metastasis in the retroperitoneum.  No need for dedicated follow-up with Interventional Radiology at this time.  Interventional Radiology will be available if the patient needs additional liver directed therapy in the future.  Electronically Signed: Burman Riis 09/03/2022, 10:13 AM   I spent a total of    25 Minutes in face to face in clinical consultation, greater than 50% of which was counseling/coordinating care for hepatocellular carcinoma.  Patient ID: Jim Little, male   DOB: June 22, 1946, 76 y.o.   MRN: [id number]

## 2022-09-03 NOTE — Telephone Encounter (Signed)
Patient contacted with appointment reminder.Transportation assistance provided.  [name] Mozell Haber RN BSN [place] Nurse [phone]

## 2022-09-04 ENCOUNTER — Other Ambulatory Visit (HOSPITAL_COMMUNITY): Payer: Self-pay

## 2022-09-04 ENCOUNTER — Other Ambulatory Visit: Payer: Self-pay

## 2022-09-04 ENCOUNTER — Telehealth: Payer: Self-pay

## 2022-09-04 NOTE — Telephone Encounter (Signed)
Spoke with Darol Destine nurse to inform her that someone from our Scheduling Team will be contacting her regarding scheduling the pt for a f/u appt with Dr. Burr Medico in 2 wks.  [name] stated the pt can come anytime but on Thursdays and to please contact her to confirm the appt.  Informed [name] that our Scheduling team will definitely contact her to confirm the appt.  [name] had no further questions or concerns at this time.

## 2022-09-04 NOTE — Congregational Nurse Program (Signed)
  Dept: [phone]   Congregational Nurse Program Note  Date of Encounter: 09/04/2022  Past Medical History: Past Medical History:  Diagnosis Date   BPH (benign prostatic hyperplasia)    Cirrhosis ([place])    Hypertension    Splenomegaly     Encounter Details:  CNP Questionnaire - 09/04/22 1901       Questionnaire   Ask client: Do you give verbal consent for me to treat you today? Yes    Student Assistance N/A    [place] Served  [facility]    Visit Setting with Client Home    Patient Status Refugee    Insurance Medicaid    Insurance/Financial Assistance Referral N/A    Medication Have Medication Insecurities;Provided Medication Assistance    Medical Provider Yes    Medical Referrals Made N/A    Medical Appointment Made N/A    Recently w/o PCP, now 1st time PCP visit completed due to CNs referral or appointment made N/A    Food N/A    Transportation Need transportation assistance;Provided transportation assistance;Referred to transportation service    Housing/Utilities N/A    Interpersonal Safety N/A    Interventions Advocate/Support;Navigate Healthcare System;Case Management;Counsel;Educate;Reviewed Medications    Abnormal to Normal Screening Since Last CN Visit N/A    Screenings CN Performed N/A    Sent Client to Lab for: N/A    Did client attend any of the following based off CNs referral or appointments made? Medical;Transportation    ED Visit Averted Yes    Life-Saving Intervention Made N/A      Questionnaire   Do you give verbal consent to treat you today? Yes    Location [facility] Served  [facility]    Visit Setting Home    Patient Status Refugee    Insurance Medicaid    Insurance Referral Medicaid    Medication Have Medication Insecurities;Provided Medication Assistance    Screening Referrals N/A    Intervention Advocate;Navigate Healthcare System;Case Management;Counsel;Educate;Support            Home Visit completed today and medication reviewed.  Medication inspected. Noted that patient has a lot of Lenvatinib '12mg'$  remaining. He has 4 months worth of medication remaining (4 bags each with 30 days tablets). During previous home visits, patient has only shown me one bag, today his wife reported that he has more inside the house. I have contacted [facility] specialty pharmacy who stated that they will get in touch with Dr Grant Ruts prior to refilling the prescription. Also, patient`s wife reported that he fell this morning in the bathroom.Patient was too weak  to get up by himself and waited for his wife to assist him.During my visit, I found patient sitting outside on his front porch and denied any injuries.He refused to go hospital for examination. I will inform his PCP and his oncologist as well.  [name] Bryella Diviney RN BSN [place] Nurse [phone]

## 2022-09-11 ENCOUNTER — Other Ambulatory Visit: Payer: Self-pay | Admitting: *Deleted

## 2022-09-11 MED ORDER — DICLOFENAC SODIUM 1 % EX GEL: CUTANEOUS | 1 refills | Status: AC

## 2022-09-18 ENCOUNTER — Telehealth: Payer: Self-pay

## 2022-09-18 NOTE — Telephone Encounter (Signed)
Contacted patient with appointment reminder. Patient requested transportation. Ride scheduled.  [name] Kylene Zamarron RN BSN [place] Nurse [phone]

## 2022-09-22 NOTE — Progress Notes (Deleted)
[place]   [phone] [phone]   Clinic Follow up Note   Patient Care Team: [name], MD as PCP - General (Family Medicine) Jonnie Finner, RN (Inactive) as Oncology Nurse Navigator Heilingoetter, Tobe Sos, PA-C as Physician Assistant (Physician Assistant) Truitt Merle, MD as Consulting Physician (Oncology) 09/22/2022  CHIEF COMPLAINT: Follow-up [place]  SUMMARY OF ONCOLOGIC HISTORY: Oncology History Overview Note  Cancer Staging Hepatocellular carcinoma [facility]) Staging form: Liver, AJCC 8th Edition - Clinical stage from 02/09/2021: Stage IIIA (cT3, cN0, cM0) - Signed by Truitt Merle, MD on 02/09/2021 Stage prefix: Initial diagnosis    Hepatocellular carcinoma ([place])  05/12/2020 Imaging   MRI Liver  IMPRESSION: 1. Geographic lesion in the inferior medial aspect of the RIGHT hepatic lobe has some imaging features suggesting focal fatty infiltration. However, atypical enhancement pattern. Recommend follow-up contrast MRI in 3 to 6 months to re-evaluate. 2. No additional lesion liver. 3. Morphologic changes consistent with cirrhosis.   01/12/2021 Imaging   MRI Abdomen  IMPRESSION: 1. Advanced cirrhotic changes involving the liver. There is a large infiltrating mass versus numerous confluent masses involving the right hepatic lobe and consistent with hepatocellular carcinoma. Associated thrombosed middle and portal veins. No definite left hepatic lobe lesions. LI-RADS 5. 2. Borderline splenomegaly. 3. No ascites or abdominal wall hernia.     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 The ultrasound correlate of the previously demonstrated right liver tumor is heterogeneous tissue with ill-defined margin.   In addition to the right-sided tumor,  there are several small hyperechoic foci of approximately 1 cm, potentially additional [place] foci versus regenerative nodules.   Cirrhosis and splenomegaly   01/15/2021 Imaging   CT Chest  IMPRESSION: 1. No evidence of metastatic disease in the chest. 2. Cirrhotic morphology of the liver with ill-defined, heterogeneous mass of the right lobe of the liver and occlusion of the right portal vein, in keeping with known hepatocellular carcinoma, better demonstrated by recent MR.   02/09/2021 Initial Diagnosis   Hepatocellular carcinoma ([place])   02/09/2021 Cancer Staging   Staging form: Liver, AJCC 8th Edition - Clinical stage from 02/09/2021: Stage IIIA (cT3, cN0, cM0) - Signed by Truitt Merle, MD on 02/09/2021 Stage prefix: Initial diagnosis   05/29/2021 Imaging   MRI Abdomen  IMPRESSION: 1. Interval decrease in size of the infiltrating mass in the right hepatic lobe showing heterogeneous arterial phase hyperenhancement. Persistent arterial phase enhancement can be seen early post y 84 embolization. No substantial restricted diffusion in the lesion on today's study. 2. Tiny focus of arterial phase hyperenhancement in the dome of the liver near the junction of segment IV and VIII. Unclear whether this was present previously given the marked motion degradation on the earlier study. Close attention on follow-up recommended. 3. 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 Subtraction postcontrast imaging suggests that there may be some enhancement in the thrombus suggesting tumor thrombus or a combination of tumor and bland thrombus. 4. Interval increase in size of the hepatoduodenal ligament lymph node measuring 2.0 x 1.7 cm. Likely metastatic disease,  continued close attention on follow-up recommended. 5. Markedly distended urinary bladder.   06/29/2021 -  02/14/2022  Chemotherapy   Patient is on Treatment Plan : LUNG Atezolizumab + Bevacizumab q21d Maintenance     11/28/2021 Imaging   EXAM: CT ABDOMEN AND PELVIS WITH CONTRAST  IMPRESSION: 1. Cirrhotic morphology of the liver with redemonstrated post procedural findings of right hepatic lobe embolization resulting in substantial atrophy of the right lobe of the liver. 2. Generally unchanged appearance of a hypoenhancing mass in the peripheral right lobe following embolization, without convincing evidence of residual or recurrent contrast enhancement. 3. No new suspicious contrast enhancement in the liver. 4. No significant change in an enlarged hepatoduodenal or portacaval lymph node measuring 3.5 x 2.5 cm, likely a nodal metastasis. 5. Additional smaller, nonspecific porta hepatis and left retroperitoneal lymph nodes. Attention on follow-up. 6. Prostatomegaly with thickening of the urinary bladder wall and hyperenhancement of the mucosa. Correlate for urinalysis evidence of infectious or inflammatory cystitis   Aortic Atherosclerosis (ICD10-I70.0).   03/06/2022 Imaging   EXAM: CT ABDOMEN AND PELVIS WITH CONTRAST  IMPRESSION: 1. Cirrhotic morphology of the liver. Unchanged appearance of a hypoenhancing mass of the posterior right lobe of the liver without arterial hyperenhancement following radioembolization. The right portal vein remains occluded. 2. No new arterial hyperenhancement in the liver. 3. Significant interval enlargement of a bulky porta hepatis node, measuring 5.4 x 3.8 cm, previously 3.5 x 2.5 cm, consistent with worsened nodal metastatic disease. Unchanged smaller porta hepatis and left retroperitoneal lymph nodes, which remain nonspecific although suspicious for metastases. 4. Thickening and hyperenhancement of the urinary bladder, again suggestive of nonspecific infectious or inflammatory cystitis, although possibly related to chronic outlet obstruction. Correlate with  urinalysis if clinically referable signs and symptoms are present.     CURRENT THERAPY: Lenvatinib 12 mg daily starting 03/13/2022  INTERVAL HISTORY: Mr. Orlich returns for follow-up as scheduled, last seen by Dr. Burr Medico in July.  He developed fevers and was seen in the ED 08/02/2022, diagnosed with a UTI.   REVIEW OF SYSTEMS:   Constitutional: Denies fevers, chills or abnormal weight loss Eyes: Denies blurriness of vision Ears, nose, mouth, throat, and face: Denies mucositis or sore throat Respiratory: Denies cough, dyspnea or wheezes Cardiovascular: Denies palpitation, chest discomfort or lower extremity swelling Gastrointestinal:  Denies nausea, heartburn or change in bowel habits Skin: Denies abnormal skin rashes Lymphatics: Denies new lymphadenopathy or easy bruising Neurological:Denies numbness, tingling or new weaknesses Behavioral/Psych: Mood is stable, no new changes  All other systems were reviewed with the patient and are negative.  MEDICAL HISTORY:  Past Medical History:  Diagnosis Date   BPH (benign prostatic hyperplasia)    Cirrhosis ([place])    Hypertension    Splenomegaly     SURGICAL HISTORY: Past Surgical History:  Procedure Laterality Date   BIOPSY  08/22/2020   Procedure: BIOPSY;  Surgeon: Ronnette Juniper, MD;  Location: [facility];  Service: Gastroenterology;;   COLONOSCOPY WITH PROPOFOL N/A 08/22/2020   Procedure: COLONOSCOPY WITH PROPOFOL;  Surgeon: Ronnette Juniper, MD;  Location: [facility];  Service: Gastroenterology;  Laterality: N/A;   ESOPHAGOGASTRODUODENOSCOPY (EGD) WITH PROPOFOL N/A 08/22/2020   Procedure: ESOPHAGOGASTRODUODENOSCOPY (EGD) WITH PROPOFOL;  Surgeon: Ronnette Juniper, MD;  Location: [facility];  Service: Gastroenterology;  Laterality: N/A;   IR ANGIOGRAM SELECTIVE EACH ADDITIONAL VESSEL  03/01/2021   IR ANGIOGRAM SELECTIVE EACH ADDITIONAL VESSEL  03/08/2021   IR ANGIOGRAM VISCERAL SELECTIVE  03/01/2021   IR ANGIOGRAM VISCERAL SELECTIVE  03/01/2021   IR  ANGIOGRAM VISCERAL SELECTIVE  03/08/2021   IR EMBO TUMOR ORGAN ISCHEMIA INFARCT INC GUIDE ROADMAPPING  03/08/2021   IR RADIOLOGIST EVAL & MGMT  02/06/2021   IR RADIOLOGIST EVAL & MGMT  05/31/2021   IR RADIOLOGIST EVAL & MGMT  09/03/2022   IR US GUIDE VASC ACCESS RIGHT  03/01/2021   IR US GUIDE VASC ACCESS RIGHT  03/08/2021   UPPER GASTROINTESTINAL ENDOSCOPY  03/2018   [place] --candidiasis plus esophageal varices grade 2.  Treated with fluconazole and status post esophageal banding.   US ECHOCARDIOGRAPHY  10/2016   per overseas records from [place] - LVEF 68%; mildly calcified aortic valve, normal function otherwise    I have reviewed the social history and family history with the patient and they are unchanged from previous note.  ALLERGIES:  is allergic to pork-derived products.  MEDICATIONS:  Current Outpatient Medications  Medication Sig Dispense Refill   amLODipine (NORVASC) 10 MG tablet Take 1 tablet (10 mg total) by mouth daily. 90 tablet 3   Bismuth/Metronidaz/Tetracyclin (PYLERA) 140-125-125 MG CAPS Take 3 tablets by mouth in the morning, at noon, in the evening, and at bedtime. 120 capsule 0   diclofenac Sodium (VOLTAREN) 1 % GEL Apply to L shoulder area 381 g 1   folic acid (FOLVITE) 1 MG tablet Take 1 tablet by mouth daily. 30 tablet 0   lactulose (CHRONULAC) 10 GM/15ML solution Take 15 mLs (10 g total) by mouth daily as needed for severe constipation or moderate constipation. 450 mL 1   lenvatinib 12 mg daily dose (LENVIMA, 12 MG DAILY DOSE,) 3 x 4 MG capsule Take 12 mg by mouth daily. Take as instructed by MD 90 capsule 1   levothyroxine (SYNTHROID) 88 MCG tablet Take 1 tablet (88 mcg total) by mouth every morning. 30 minutes before food 60 tablet 1   nitrofurantoin, macrocrystal-monohydrate, (MACROBID) 100 MG capsule Take 1 capsule (100 mg total) by mouth 2 (two) times daily. 10 capsule 0   Olopatadine HCl (PAZEO) 0.7 % SOLN Use once daily in both eyes (Patient not  taking: Reported on 04/24/2022) 2.5 mL 0   omeprazole (PRILOSEC) 40 MG capsule Take 1 capsule (40 mg total) by mouth 2 (two) times daily. 60 capsule 0   tamsulosin (FLOMAX) 0.4 MG CAPS capsule Take 1 capsule (0.4 mg total) by mouth daily. Please pack and mail to home (Patient not taking: Reported on 05/12/2022) 90 capsule 3   No current facility-administered medications for this visit.   Facility-Administered Medications Ordered in Other Visits  Medication Dose Route Frequency Provider Last Rate Last Admin   sodium chloride flush (NS) 0.9 % injection 10 mL  10 mL Intracatheter PRN Truitt Merle, MD   10 mL at 09/19/21 1604    PHYSICAL EXAMINATION: ECOG PERFORMANCE STATUS: {CHL ONC ECOG PS:[id number]}  There were no vitals filed for this visit. There were no vitals filed for this visit.  GENERAL:alert, no distress and comfortable SKIN: skin color, texture, turgor are normal, no rashes or significant lesions EYES: normal, Conjunctiva are pink and non-injected, sclera clear OROPHARYNX:no exudate, no erythema and lips, buccal mucosa, and tongue normal  NECK: supple, thyroid normal size, non-tender, without nodularity LYMPH:  no palpable lymphadenopathy in the cervical, axillary or inguinal LUNGS: clear to auscultation and percussion with normal breathing effort HEART: regular rate & rhythm and no murmurs and no lower extremity edema ABDOMEN:abdomen soft, non-tender and normal bowel sounds Musculoskeletal:no cyanosis of digits and no clubbing  NEURO: alert & oriented x 3 with fluent speech, no focal motor/sensory deficits  LABORATORY DATA:  I have reviewed the data  as listed    Latest Ref Rng & Units 08/02/2022    9:45 AM 06/20/2022    1:13 PM 05/22/2022    9:50 AM  CBC  WBC 4.0 - 10.5 K/uL 3.1  2.8  2.7   Hemoglobin 13.0 - 17.0 g/dL 12.3  12.1  13.1   Hematocrit 39.0 - 52.0 % 36.6  35.0  38.0   Platelets 150 - 400 K/uL 93  86  151         Latest Ref Rng & Units 08/02/2022    9:45 AM  06/20/2022    1:13 PM 05/23/2022   10:05 AM  CMP  Glucose 70 - 99 mg/dL 100  118    BUN 8 - 23 mg/dL 11  13    Creatinine 0.61 - 1.24 mg/dL 0.84  0.74    Sodium 135 - 145 mmol/L 135  138    Potassium 3.5 - 5.1 mmol/L 4.1  3.9    Chloride 98 - 111 mmol/L 107  108    CO2 22 - 32 mmol/L 24  26    Calcium 8.9 - 10.3 mg/dL 8.9  9.3    Total Protein 6.5 - 8.1 g/dL 7.9  8.3  8.6   Total Bilirubin 0.3 - 1.2 mg/dL 0.7  0.8  0.8   Alkaline Phos 38 - 126 U/L 137  141  182   AST 15 - 41 U/L 80  67  71   ALT 0 - 44 U/L 51  43  44       RADIOGRAPHIC STUDIES: I have personally reviewed the radiological images as listed and agreed with the findings in the report. No results found.   ASSESSMENT & PLAN:  No problem-specific Assessment & Plan notes found for this encounter.   No orders of the defined types were placed in this encounter.  All questions were answered. The patient knows to call the clinic with any problems, questions or concerns. No barriers to learning was detected. I spent {CHL ONC TIME VISIT - PJKDT:[id number]} counseling the patient face to face. The total time spent in the appointment was {CHL ONC TIME VISIT - XIPJA:[id number]} and more than 50% was on counseling and review of test results     Alla Feeling, NP 09/22/22

## 2022-09-24 ENCOUNTER — Inpatient Hospital Stay: Payer: Medicaid Other

## 2022-09-24 ENCOUNTER — Telehealth: Payer: Self-pay

## 2022-09-24 ENCOUNTER — Inpatient Hospital Stay (HOSPITAL_BASED_OUTPATIENT_CLINIC_OR_DEPARTMENT_OTHER): Payer: Medicaid Other | Admitting: Nurse Practitioner

## 2022-09-24 ENCOUNTER — Other Ambulatory Visit: Payer: Self-pay

## 2022-09-24 ENCOUNTER — Encounter: Payer: Self-pay | Admitting: Nurse Practitioner

## 2022-09-24 ENCOUNTER — Inpatient Hospital Stay: Payer: Medicaid Other | Admitting: Nurse Practitioner

## 2022-09-24 ENCOUNTER — Inpatient Hospital Stay: Payer: Medicaid Other | Attending: Hematology

## 2022-09-24 ENCOUNTER — Other Ambulatory Visit (HOSPITAL_COMMUNITY): Payer: Self-pay

## 2022-09-24 VITALS — BP 184/93 | HR 87 | Temp 98.4°F | Resp 17 | Wt 141.4 lb

## 2022-09-24 DIAGNOSIS — Z923 Personal history of irradiation: Secondary | ICD-10-CM | POA: Diagnosis not present

## 2022-09-24 DIAGNOSIS — C220 Liver cell carcinoma: Secondary | ICD-10-CM | POA: Insufficient documentation

## 2022-09-24 DIAGNOSIS — K7460 Unspecified cirrhosis of liver: Secondary | ICD-10-CM | POA: Insufficient documentation

## 2022-09-24 DIAGNOSIS — I81 Portal vein thrombosis: Secondary | ICD-10-CM | POA: Diagnosis not present

## 2022-09-24 DIAGNOSIS — Z87440 Personal history of urinary (tract) infections: Secondary | ICD-10-CM | POA: Insufficient documentation

## 2022-09-24 DIAGNOSIS — I10 Essential (primary) hypertension: Secondary | ICD-10-CM | POA: Insufficient documentation

## 2022-09-24 DIAGNOSIS — C779 Secondary and unspecified malignant neoplasm of lymph node, unspecified: Secondary | ICD-10-CM | POA: Diagnosis not present

## 2022-09-24 LAB — TSH: TSH: 5.276 u[IU]/mL — ABNORMAL HIGH (ref 0.350–4.500)

## 2022-09-24 LAB — CBC WITH DIFFERENTIAL (CANCER CENTER ONLY)
Abs Immature Granulocytes: 0 10*3/uL (ref 0.00–0.07)
Basophils Absolute: 0 10*3/uL (ref 0.0–0.1)
Basophils Relative: 0 %
Eosinophils Absolute: 0.1 10*3/uL (ref 0.0–0.5)
Eosinophils Relative: 2 %
HCT: 39.1 % (ref 39.0–52.0)
Hemoglobin: 14.3 g/dL (ref 13.0–17.0)
Immature Granulocytes: 0 %
Lymphocytes Relative: 15 %
Lymphs Abs: 0.6 10*3/uL — ABNORMAL LOW (ref 0.7–4.0)
MCH: 35.7 pg — ABNORMAL HIGH (ref 26.0–34.0)
MCHC: 36.6 g/dL — ABNORMAL HIGH (ref 30.0–36.0)
MCV: 97.5 fL (ref 80.0–100.0)
Monocytes Absolute: 0.7 10*3/uL (ref 0.1–1.0)
Monocytes Relative: 18 %
Neutro Abs: 2.7 10*3/uL (ref 1.7–7.7)
Neutrophils Relative %: 65 %
Platelet Count: 138 10*3/uL — ABNORMAL LOW (ref 150–400)
RBC: 4.01 MIL/uL — ABNORMAL LOW (ref 4.22–5.81)
RDW: 13.1 % (ref 11.5–15.5)
WBC Count: 4.2 10*3/uL (ref 4.0–10.5)
nRBC: 0 % (ref 0.0–0.2)

## 2022-09-24 LAB — CMP (CANCER CENTER ONLY)
ALT: 22 U/L (ref 0–44)
AST: 48 U/L — ABNORMAL HIGH (ref 15–41)
Albumin: 3.8 g/dL (ref 3.5–5.0)
Alkaline Phosphatase: 160 U/L — ABNORMAL HIGH (ref 38–126)
Anion gap: 7 (ref 5–15)
BUN: 12 mg/dL (ref 8–23)
CO2: 28 mmol/L (ref 22–32)
Calcium: 9.7 mg/dL (ref 8.9–10.3)
Chloride: 97 mmol/L — ABNORMAL LOW (ref 98–111)
Creatinine: 0.97 mg/dL (ref 0.61–1.24)
GFR, Estimated: >60 mL/min (ref >60)
Glucose, Bld: 100 mg/dL — ABNORMAL HIGH (ref 70–99)
Potassium: 4.1 mmol/L (ref 3.5–5.1)
Sodium: 132 mmol/L — ABNORMAL LOW (ref 135–145)
Total Bilirubin: 1.1 mg/dL (ref 0.3–1.2)
Total Protein: 9.4 g/dL — ABNORMAL HIGH (ref 6.5–8.1)

## 2022-09-24 LAB — T4, FREE: Free T4: 0.87 ng/dL (ref 0.61–1.12)

## 2022-09-24 NOTE — Telephone Encounter (Signed)
Patient called with appointment reminder. Informed that transportation will arrived around 11:18 am.   Honor Loh RN BSN [facility] Nurse [phone]

## 2022-09-24 NOTE — Progress Notes (Addendum)
[place]   Telephone:(336) [phone] [phone]) [phone]   Clinic Follow up Note   Patient Care Team: [name], MD as PCP - General (Family Medicine) Jonnie Finner, RN (Inactive) as Oncology Nurse Navigator Heilingoetter, Tobe Sos, PA-C as Physician Assistant (Physician Assistant) Truitt Merle, MD as Consulting Physician (Oncology) 09/24/2022  CHIEF COMPLAINT: Follow up [place]  SUMMARY OF ONCOLOGIC HISTORY: Oncology History Overview Note  Cancer Staging Hepatocellular carcinoma [facility]) Staging form: Liver, AJCC 8th Edition - Clinical stage from 02/09/2021: Stage IIIA (cT3, cN0, cM0) - Signed by Truitt Merle, MD on 02/09/2021 Stage prefix: Initial diagnosis    Hepatocellular carcinoma ([place])  05/12/2020 Imaging   MRI Liver  IMPRESSION: 1. Geographic lesion in the inferior medial aspect of the RIGHT hepatic lobe has some imaging features suggesting focal fatty infiltration. However, atypical enhancement pattern. Recommend follow-up contrast MRI in 3 to 6 months to re-evaluate. 2. No additional lesion liver. 3. Morphologic changes consistent with cirrhosis.   01/12/2021 Imaging   MRI Abdomen  IMPRESSION: 1. Advanced cirrhotic changes involving the liver. There is a large infiltrating mass versus numerous confluent masses involving the right hepatic lobe and consistent with hepatocellular carcinoma. Associated thrombosed middle and portal veins. No definite left hepatic lobe lesions. LI-RADS 5. 2. Borderline splenomegaly. 3. No ascites or abdominal wall hernia.     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 The ultrasound correlate of the previously demonstrated right liver tumor is heterogeneous tissue with ill-defined margin.   In addition to the right-sided tumor,  there are several small hyperechoic foci of approximately 1 cm, potentially additional [place] foci versus regenerative nodules.   Cirrhosis and splenomegaly   01/15/2021 Imaging   CT Chest  IMPRESSION: 1. No evidence of metastatic disease in the chest. 2. Cirrhotic morphology of the liver with ill-defined, heterogeneous mass of the right lobe of the liver and occlusion of the right portal vein, in keeping with known hepatocellular carcinoma, better demonstrated by recent MR.   02/09/2021 Initial Diagnosis   Hepatocellular carcinoma ([place])   02/09/2021 Cancer Staging   Staging form: Liver, AJCC 8th Edition - Clinical stage from 02/09/2021: Stage IIIA (cT3, cN0, cM0) - Signed by Truitt Merle, MD on 02/09/2021 Stage prefix: Initial diagnosis   05/29/2021 Imaging   MRI Abdomen  IMPRESSION: 1. Interval decrease in size of the infiltrating mass in the right hepatic lobe showing heterogeneous arterial phase hyperenhancement. Persistent arterial phase enhancement can be seen early post y 33 embolization. No substantial restricted diffusion in the lesion on today's study. 2. Tiny focus of arterial phase hyperenhancement in the dome of the liver near the junction of segment IV and VIII. Unclear whether this was present previously given the marked motion degradation on the earlier study. Close attention on follow-up recommended. 3. 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 Subtraction postcontrast imaging suggests that there may be some enhancement in the thrombus suggesting tumor thrombus or a combination of tumor and bland thrombus. 4. Interval increase in size of the hepatoduodenal ligament lymph node measuring 2.0 x 1.7 cm. Likely metastatic disease,  continued close attention on follow-up recommended. 5. Markedly distended urinary bladder.   06/29/2021 -  02/14/2022 Chemotherapy   Patient is on Treatment Plan : LUNG Atezolizumab + Bevacizumab q21d Maintenance     11/28/2021 Imaging   EXAM: CT ABDOMEN AND PELVIS WITH CONTRAST  IMPRESSION: 1. Cirrhotic morphology of the liver with redemonstrated post procedural findings of right hepatic lobe embolization resulting in substantial atrophy of the right lobe of the liver. 2. Generally unchanged appearance of a hypoenhancing mass in the peripheral right lobe following embolization, without convincing evidence of residual or recurrent contrast enhancement. 3. No new suspicious contrast enhancement in the liver. 4. No significant change in an enlarged hepatoduodenal or portacaval lymph node measuring 3.5 x 2.5 cm, likely a nodal metastasis. 5. Additional smaller, nonspecific porta hepatis and left retroperitoneal lymph nodes. Attention on follow-up. 6. Prostatomegaly with thickening of the urinary bladder wall and hyperenhancement of the mucosa. Correlate for urinalysis evidence of infectious or inflammatory cystitis   Aortic Atherosclerosis (ICD10-I70.0).   03/06/2022 Imaging   EXAM: CT ABDOMEN AND PELVIS WITH CONTRAST  IMPRESSION: 1. Cirrhotic morphology of the liver. Unchanged appearance of a hypoenhancing mass of the posterior right lobe of the liver without arterial hyperenhancement following radioembolization. The right portal vein remains occluded. 2. No new arterial hyperenhancement in the liver. 3. Significant interval enlargement of a bulky porta hepatis node, measuring 5.4 x 3.8 cm, previously 3.5 x 2.5 cm, consistent with worsened nodal metastatic disease. Unchanged smaller porta hepatis and left retroperitoneal lymph nodes, which remain nonspecific although suspicious for metastases. 4. Thickening and hyperenhancement of the urinary bladder, again suggestive of nonspecific infectious or inflammatory cystitis, although possibly related to chronic outlet obstruction. Correlate with  urinalysis if clinically referable signs and symptoms are present.   07/25/2022 Imaging   CT AP IMPRESSION: 1. Decreased conspicuity of the previously radioembolized ill-defined mass in the posterior right lobe of the liver without arterial hyperenhancement to suggest residual/recurrent disease on this motion degraded examination. Right portal vein remains occluded. 2. Cirrhotic hepatic morphology without new suspicious arterially enhancing hepatic lesion identified. 3. Decreased size with increased central necrosis in the large portacaval lymph node metastasis. Unchanged size of the smaller porta hepatis and left retroperitoneal lymph nodes. 4. Similar thickening and hyperenhancement of the urinary bladder wall possibly reflecting a combination of infectious/inflammatory cystitis and chronic outflow impedance consider further evaluation with cystoscopy if not previously performed as an underlying bladder malignancy can not be excluded. 5. Large volume of formed stool throughout the colon as can be seen with constipation. 6.  Aortic Atherosclerosis (ICD10-I70.0).     08/28/2022 Imaging   ABD MRI IMPRESSION: 1. Today's MR examination is significantly limited by extensive breath motion artifact throughout, particularly multiphasic contrast enhanced sequences of primary interest. Given this limitation, multiphasic contrast enhanced CT is most likely the test of choice for future follow-up. 2. Within this limitation, no significant change in morphologic appearance of an ill-defined mass in the posterior right lobe of the liver, which is severely atrophic following treatment. There does appear to be some diffuse, ill-defined contrast enhancement throughout the remnant right lobe of the liver, both on arterial phase and later contrast phases which is not clearly focal to the known lesion and most likely reflects post treatment hyperemia. 3. A small hypoenhancing lesion seen by prior CT examinations in the  posterior left lobe of the liver is unchanged and hypoenhancing, measuring 0.9 x 0.8 cm. This is not of fluid character on this examination (i.e. not likely a cyst as previously suggested), although incompletely characterized. Attention on follow-up. 4. Significant interval  enlargement of at least one left retroperitoneal lymph node, consistent with worsened nodal metastatic disease. Previously seen bulky portacaval node is unchanged. 5. Cirrhosis.     CURRENT THERAPY: Lenvatinib 12 mg daily, Prescribed ~03/13/22 but noncompliant until ~September/October 2023   INTERVAL HISTORY: Mr. Balaban returns for follow up as scheduled, last seen by Dr. Burr Medico 06/20/22.  A restaging CT AP 07/25/2022 showed decreased conspicuity of the previously treated posterior right lobe liver mass and decreased size in the large portacaval lymph node; other smaller porta hepatis and left RP lymph nodes were unchanged overall no new disease.  Seen in ED 08/02/22 for recurrent UTI.  He had a follow-up MRI and visit with interventional radiologist Dr. Anselm Pancoast last week which showed significant interval enlargement of at least 1 left RP lymph node.   Today he is here with an interpreter. He feels good overall except worse back ache. His legs feel weak at times. He rests at home and walks around occasionally. His appetite is present but food doesn't taste good. Tells me he takes lenvima daily, has not missed a dose but needs a refill. He denies side effects. Abdominal bloating improved when he started taking it.  All other systems were reviewed with the patient and are negative.  MEDICAL HISTORY:  Past Medical History:  Diagnosis Date   BPH (benign prostatic hyperplasia)    Cirrhosis ([place])    Hypertension    Splenomegaly     SURGICAL HISTORY: Past Surgical History:  Procedure Laterality Date   BIOPSY  08/22/2020   Procedure: BIOPSY;  Surgeon: Ronnette Juniper, MD;  Location: [facility];  Service: Gastroenterology;;   COLONOSCOPY  WITH PROPOFOL N/A 08/22/2020   Procedure: COLONOSCOPY WITH PROPOFOL;  Surgeon: Ronnette Juniper, MD;  Location: [facility];  Service: Gastroenterology;  Laterality: N/A;   ESOPHAGOGASTRODUODENOSCOPY (EGD) WITH PROPOFOL N/A 08/22/2020   Procedure: ESOPHAGOGASTRODUODENOSCOPY (EGD) WITH PROPOFOL;  Surgeon: Ronnette Juniper, MD;  Location: [facility];  Service: Gastroenterology;  Laterality: N/A;   IR ANGIOGRAM SELECTIVE EACH ADDITIONAL VESSEL  03/01/2021   IR ANGIOGRAM SELECTIVE EACH ADDITIONAL VESSEL  03/08/2021   IR ANGIOGRAM VISCERAL SELECTIVE  03/01/2021   IR ANGIOGRAM VISCERAL SELECTIVE  03/01/2021   IR ANGIOGRAM VISCERAL SELECTIVE  03/08/2021   IR EMBO TUMOR ORGAN ISCHEMIA INFARCT INC GUIDE ROADMAPPING  03/08/2021   IR RADIOLOGIST EVAL & MGMT  02/06/2021   IR RADIOLOGIST EVAL & MGMT  05/31/2021   IR RADIOLOGIST EVAL & MGMT  09/03/2022   IR US GUIDE VASC ACCESS RIGHT  03/01/2021   IR US GUIDE VASC ACCESS RIGHT  03/08/2021   UPPER GASTROINTESTINAL ENDOSCOPY  03/2018   [place] --candidiasis plus esophageal varices grade 2.  Treated with fluconazole and status post esophageal banding.   US ECHOCARDIOGRAPHY  10/2016   per overseas records from [place] - LVEF 68%; mildly calcified aortic valve, normal function otherwise    I have reviewed the social history and family history with the patient and they are unchanged from previous note.  ALLERGIES:  is allergic to pork-derived products.  MEDICATIONS:  Current Outpatient Medications  Medication Sig Dispense Refill   amLODipine (NORVASC) 10 MG tablet Take 1 tablet (10 mg total) by mouth daily. 90 tablet 3   Bismuth/Metronidaz/Tetracyclin (PYLERA) 140-125-125 MG CAPS Take 3 tablets by mouth in the morning, at noon, in the evening, and at bedtime. 120 capsule 0   diclofenac Sodium (VOLTAREN) 1 % GEL Apply to L shoulder area 962 g 1   folic acid (FOLVITE) 1  MG tablet Take 1 tablet by mouth daily. 30 tablet 0   lactulose (CHRONULAC) 10 GM/15ML solution  Take 15 mLs (10 g total) by mouth daily as needed for severe constipation or moderate constipation. 450 mL 1   lenvatinib 12 mg daily dose (LENVIMA, 12 MG DAILY DOSE,) 3 x 4 MG capsule Take 12 mg by mouth daily. Take as instructed by MD 90 capsule 1   levothyroxine (SYNTHROID) 88 MCG tablet Take 1 tablet (88 mcg total) by mouth every morning. 30 minutes before food 60 tablet 1   nitrofurantoin, macrocrystal-monohydrate, (MACROBID) 100 MG capsule Take 1 capsule (100 mg total) by mouth 2 (two) times daily. 10 capsule 0   Olopatadine HCl (PAZEO) 0.7 % SOLN Use once daily in both eyes (Patient not taking: Reported on 04/24/2022) 2.5 mL 0   omeprazole (PRILOSEC) 40 MG capsule Take 1 capsule (40 mg total) by mouth 2 (two) times daily. 60 capsule 0   tamsulosin (FLOMAX) 0.4 MG CAPS capsule Take 1 capsule (0.4 mg total) by mouth daily. Please pack and mail to home (Patient not taking: Reported on 05/12/2022) 90 capsule 3   No current facility-administered medications for this visit.   Facility-Administered Medications Ordered in Other Visits  Medication Dose Route Frequency Provider Last Rate Last Admin   sodium chloride flush (NS) 0.9 % injection 10 mL  10 mL Intracatheter PRN Truitt Merle, MD   10 mL at 09/19/21 1604    PHYSICAL EXAMINATION: ECOG PERFORMANCE STATUS: 1 - Symptomatic but completely ambulatory  Vitals:   09/24/22 1227  BP: (!) 184/93  Pulse: 87  Resp: 17  Temp: 98.4 F (36.9 C)  SpO2: 98%   Filed Weights   09/24/22 1227  Weight: 141 lb 6 oz (64.1 kg)    GENERAL:alert, no distress and comfortable SKIN: no rash  EYES: sclera clear LUNGS: clear with normal breathing effort HEART: regular rate & rhythm, no lower extremity edema ABDOMEN:abdomen soft, non-tender and normal bowel sounds NEURO: alert & oriented x 3 with fluent speech Exam performed in wheelchair    LABORATORY DATA:  I have reviewed the data as listed    Latest Ref Rng & Units 08/02/2022    9:45 AM 06/20/2022     1:13 PM 05/22/2022    9:50 AM  CBC  WBC 4.0 - 10.5 K/uL 3.1  2.8  2.7   Hemoglobin 13.0 - 17.0 g/dL 12.3  12.1  13.1   Hematocrit 39.0 - 52.0 % 36.6  35.0  38.0   Platelets 150 - 400 K/uL 93  86  151         Latest Ref Rng & Units 08/02/2022    9:45 AM 06/20/2022    1:13 PM 05/23/2022   10:05 AM  CMP  Glucose 70 - 99 mg/dL 100  118    BUN 8 - 23 mg/dL 11  13    Creatinine 0.61 - 1.24 mg/dL 0.84  0.74    Sodium 135 - 145 mmol/L 135  138    Potassium 3.5 - 5.1 mmol/L 4.1  3.9    Chloride 98 - 111 mmol/L 107  108    CO2 22 - 32 mmol/L 24  26    Calcium 8.9 - 10.3 mg/dL 8.9  9.3    Total Protein 6.5 - 8.1 g/dL 7.9  8.3  8.6   Total Bilirubin 0.3 - 1.2 mg/dL 0.7  0.8  0.8   Alkaline Phos 38 - 126 U/L 137  141  182  AST 15 - 41 U/L 80  67  71   ALT 0 - 44 U/L 51  43  44       RADIOGRAPHIC STUDIES: I have personally reviewed the radiological images as listed and agreed with the findings in the report. No results found.   ASSESSMENT & PLAN: Noboru Bidinger is a 76 y.o. male with    1. Hepatocellular Carcinoma, unresectable, stage IIIA, with node metastasis  -presented with nausea/vomiting, h/o splenomegaly and cirrhosis. Prior liver biopsy 07/14/20 was negative for malignancy. MRI liver on 01/12/21 showed Large infiltrating mass vs numerous confluent masses involving the right hepatic lobe. This is consistent with hepatocellular carcinoma. Baseline AFP 01/14/21 was 11,904. -staging chest CT 01/15/21 was negative -s/p Y 90 radioembolization on 03/08/21, his AFP dropped significantly -he developed metastatic disease is a hepatoduodenal ligament lymph node on MRI 05/29/21. He started [place] and Avastin on 06/29/21, tolerated relatively well with constipation. -due to rising AFP, CT AP obtained on 03/06/22 showed: unchanged right liver mass; significant enlargement of porta hepatis node; unchanged smaller RP nodes. -He was prescribed lenvatinib on ~03/13/22 but evidently was noncompliant, either  didn't start it or didn't take consistently -s/p radiation to metastatic lymph nodes 6/22-05/30/22; AFP decreased  -Mr. Lorenzetti appears stable. I reviewed his CT AP from 07/25/22 and MRI from 08/28/22; MRI shows progression of left RP node, otherwise stable. This is likely due to noncompliance with lenvima -He reportedly started using a pill box 1-2 months ago and is taking 12 mg daily now, tolerating well. Will continue and monitor him closer. He agrees to bring pill box and Rx bottles to each visit.  -today's labs are pending -Pt seen with Dr. Burr Medico who reviewed his imaging and recommends to continue Lenvima 12 mg po once daily -F/up monthly and repeat CT in 3 months    2. Recurrent UTI, h/o of urinary retention -UA has been positive on 01/03/22, 01/23/22, 03/29/22 (at [facility]), and 05/12/22.   3. Social Support, Language barrier -From the [place], was at a refugee camp in [place] -Has several family members living in single house. 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 Neither he nor his family speak [place]  -He speaks Swahili. He has [facility] Nurse, Honor Loh, who helps him. Her contact is [phone]   4. Portal vein thrombosis, Cirrhosis  -Seen with direct admission from 2/19-2/22/22 -Patient received heparin drip inpatient. Dr. Marin Olp did not recommend anticoagulation since the thrombus is tumor related. -He has unclear etiology of cirrhosis, which is followed by Dr. Therisa Doyne. Hep B surface antigen non reactive (05/15/20), ANA negative, Hep C ab non reactive. No alcohol abuse. Prior work up with EGD/Colonoscopy in 07/2020 showed no varices     PLAN: -recent imaging reviewed -Lab today -Continue Lenvima 12 mg po once daily, bring pill box and all bottles to each visit -lab and follow up q4 weeks -Repeat CT in 3 months    Orders Placed This Encounter  Procedures   CBC with Differential ([place] Only)     Standing Status:   Future    Number of Occurrences:   1    Standing Expiration Date:   09/25/2023   CMP ([place] only)    Standing Status:   Future    Number of Occurrences:   1    Standing Expiration Date:   09/25/2023   AFP tumor marker    Standing Status:   Future  Number of Occurrences:   1    Standing Expiration Date:   09/24/2023   All questions were answered. The patient knows to call the clinic with any problems, questions or concerns. No barriers to learning were detected with use of an interpreter.     Alla Feeling, NP 09/24/22    Addendum I have seen the patient, examined him. I agree with the assessment and and plan and have edited the notes.   Mr. Eyer is clinically doing well. According to his congressional nurse [name], and we confirmed with [place], he was not compliant with his oral Levatinib, but he has been taking it lately.  I personally reviewed his restaging MRI scan images, which unfortunately had poor quality due to the motion effect, showed stable disease in liver and portal nodes, and progressive disease in retroperitoneal adenopathy.  He is clinically not symptomatic, will continue Lenvima for additional 2 to 3 months, follow-up AFP, and repeat CT scan in 2 to 3 months to better evaluate treatment response from [facility].  All questions were answered.  We will see him back every month in the next 3 months to ensure his compliance with oral medication.  We repeatedly reminded him to bring all the medication bottles and his pillbox with his next visit.  Truitt Merle  09/24/2022

## 2022-09-25 ENCOUNTER — Other Ambulatory Visit: Payer: Self-pay

## 2022-09-25 ENCOUNTER — Telehealth: Payer: Self-pay | Admitting: Family Medicine

## 2022-09-25 DIAGNOSIS — C220 Liver cell carcinoma: Secondary | ICD-10-CM

## 2022-09-25 LAB — AFP TUMOR MARKER: AFP, Serum, Tumor Marker: 1898 ng/mL — ABNORMAL HIGH (ref 0.0–8.4)

## 2022-09-25 MED ORDER — LEVOTHYROXINE SODIUM 100 MCG PO TABS: 100.0000 ug | ORAL_TABLET | ORAL | 3 refills | Status: DC

## 2022-09-25 NOTE — Telephone Encounter (Signed)
TSH above range. Increased Synthroid to 100 mcg. Sent new Rx to pharmacy.  [name]- could you please let Ramere know about the increased dose?  Thank you Krishan Mcbreen

## 2022-10-09 ENCOUNTER — Other Ambulatory Visit (HOSPITAL_COMMUNITY): Payer: Self-pay

## 2022-10-09 NOTE — Congregational Nurse Program (Addendum)
  Dept: [phone]   Congregational Nurse Program Note  Date of Encounter: 10/09/2022  Past Medical History: Past Medical History:  Diagnosis Date   BPH (benign prostatic hyperplasia)    Cirrhosis ([place])    Hypertension    Splenomegaly     Encounter Details:  CNP Questionnaire - 10/09/22 1200       Questionnaire   Ask client: Do you give verbal consent for me to treat you today? Yes    Student Assistance N/A    [place] Patient Served  [facility]    Visit Setting with Client Home    Patient Status Refugee    Insurance Medicaid    Insurance/Financial Assistance Referral N/A    Medication Have Medication Insecurities;Provided Medication Assistance    Medical Provider Yes    Screening Referrals Made N/A    Medical Referrals Made N/A    Medical Appointment Made N/A    Recently w/o PCP, now 1st time PCP visit completed due to CNs referral or appointment made N/A    Food N/A    Transportation Need transportation assistance;Provided transportation assistance;Referred to transportation service    Housing/Utilities N/A    Interpersonal Safety N/A    Interventions Advocate/Support;Navigate Healthcare System;Case Management;Counsel;Educate;Reviewed Medications    Abnormal to Normal Screening Since Last CN Visit N/A    Screenings CN Performed N/A    Sent Client to Lab for: N/A    Did client attend any of the following based off CNs referral or appointments made? Medical;Transportation;Medication Assistance    ED Visit Averted Yes    Life-Saving Intervention Made N/A           Home visit completed. Patient dd not get out of the bedroom. He c/o abdominal pain and constipation. Unable to tell me date of the last bowel movement.  Medication reviewed and patient's wife reports that he has been compliant since my last visit. Noted medication that was delivered 2 weeks ago is unopened . Pill organizer is empty. Norvasc and Synthroid placed into pill organizer to take everyday before  breakfast. Patient has run out flomax. [employer] contacted and refilled.  6pm- Picked up lactulose from [facility] an delivered to his home. Daughter in law Jim Little instructed on how to administer medication.   [name] Jim Dimaria RN BSN [place] Nurse [phone]

## 2022-10-16 NOTE — Congregational Nurse Program (Signed)
  Dept: [phone]   Congregational Nurse Program Note  Date of Encounter: 10/16/2022  Past Medical History: Past Medical History:  Diagnosis Date   BPH (benign prostatic hyperplasia)    Cirrhosis ([place])    Hypertension    Splenomegaly     Encounter Details:  CNP Questionnaire - 10/16/22 1100       Questionnaire   Ask client: Do you give verbal consent for me to treat you today? Yes    Student Assistance N/A    Location Patient Served  [facility]    Visit Setting with Client Telehealth    Patient Status Refugee    Insurance Medicaid    Insurance/Financial Assistance Referral N/A    Medication Have Medication Insecurities;Provided Medication Assistance    Medical Provider Yes    Screening Referrals Made N/A    Medical Referrals Made N/A    Medical Appointment Made N/A    Recently w/o PCP, now 1st time PCP visit completed due to CNs referral or appointment made N/A    Food N/A    Transportation Need transportation assistance;Provided transportation assistance;Referred to transportation service    Housing/Utilities N/A    Interpersonal Safety N/A    Interventions Advocate/Support;Navigate Healthcare System;Case Management;Counsel;Educate;Reviewed Medications    Abnormal to Normal Screening Since Last CN Visit N/A    Screenings CN Performed N/A    Sent Client to Lab for: N/A    Did client attend any of the following based off CNs referral or appointments made? Medical;Transportation;Medication Assistance    ED Visit Averted Yes    Life-Saving Intervention Made N/A            patient called with appointment reminder. Transportation assistance will be provided.  [name] Verlena Marlette RN BSN [place] Nurse [phone]

## 2022-10-23 ENCOUNTER — Telehealth: Payer: Self-pay

## 2022-10-23 ENCOUNTER — Inpatient Hospital Stay: Payer: Medicaid Other | Attending: Hematology

## 2022-10-23 ENCOUNTER — Inpatient Hospital Stay: Payer: Medicaid Other | Admitting: Nurse Practitioner

## 2022-10-23 NOTE — Telephone Encounter (Signed)
I have contacted patient and spoke to the patient and his wife. Patient refuses to go to oncology appointment scheduled for this morning. I had called him 2 days ago with reminder and he had no objections then.Today he said "I am not going".  [name] Norton Bivins RN BSN [place] Nurse [phone]

## 2022-10-23 NOTE — Telephone Encounter (Signed)
Spouse reports non compliance with medication. Despite my assistance with pill box, patient is still non compliant and will pick medication randomly from the pill box.  I have contacted [employer] to organize his medication in blister packs.  [name] Bradly Sangiovanni RN BSN [place] Nurse [phone]

## 2022-10-23 NOTE — Progress Notes (Deleted)
[place]   [phone] [phone]   Clinic Follow up Note   Patient Care Team: [name], MD as PCP - General (Family Medicine) Jonnie Finner, RN (Inactive) as Oncology Nurse Navigator Heilingoetter, Tobe Sos, PA-C as Physician Assistant (Physician Assistant) Truitt Merle, MD as Consulting Physician (Oncology) 10/23/2022  CHIEF COMPLAINT: Follow-up [place]  SUMMARY OF ONCOLOGIC HISTORY: Oncology History Overview Note  Cancer Staging Hepatocellular carcinoma [facility]) Staging form: Liver, AJCC 8th Edition - Clinical stage from 02/09/2021: Stage IIIA (cT3, cN0, cM0) - Signed by Truitt Merle, MD on 02/09/2021 Stage prefix: Initial diagnosis    Hepatocellular carcinoma ([place])  05/12/2020 Imaging   MRI Liver  IMPRESSION: 1. Geographic lesion in the inferior medial aspect of the RIGHT hepatic lobe has some imaging features suggesting focal fatty infiltration. However, atypical enhancement pattern. Recommend follow-up contrast MRI in 3 to 6 months to re-evaluate. 2. No additional lesion liver. 3. Morphologic changes consistent with cirrhosis.   01/12/2021 Imaging   MRI Abdomen  IMPRESSION: 1. Advanced cirrhotic changes involving the liver. There is a large infiltrating mass versus numerous confluent masses involving the right hepatic lobe and consistent with hepatocellular carcinoma. Associated thrombosed middle and portal veins. No definite left hepatic lobe lesions. LI-RADS 5. 2. Borderline splenomegaly. 3. No ascites or abdominal wall hernia.     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 The ultrasound correlate of the previously demonstrated right liver tumor is heterogeneous tissue with ill-defined margin.   In addition to the right-sided tumor,  there are several small hyperechoic foci of approximately 1 cm, potentially additional [place] foci versus regenerative nodules.   Cirrhosis and splenomegaly   01/15/2021 Imaging   CT Chest  IMPRESSION: 1. No evidence of metastatic disease in the chest. 2. Cirrhotic morphology of the liver with ill-defined, heterogeneous mass of the right lobe of the liver and occlusion of the right portal vein, in keeping with known hepatocellular carcinoma, better demonstrated by recent MR.   02/09/2021 Initial Diagnosis   Hepatocellular carcinoma ([place])   02/09/2021 Cancer Staging   Staging form: Liver, AJCC 8th Edition - Clinical stage from 02/09/2021: Stage IIIA (cT3, cN0, cM0) - Signed by Truitt Merle, MD on 02/09/2021 Stage prefix: Initial diagnosis   05/29/2021 Imaging   MRI Abdomen  IMPRESSION: 1. Interval decrease in size of the infiltrating mass in the right hepatic lobe showing heterogeneous arterial phase hyperenhancement. Persistent arterial phase enhancement can be seen early post y 37 embolization. No substantial restricted diffusion in the lesion on today's study. 2. Tiny focus of arterial phase hyperenhancement in the dome of the liver near the junction of segment IV and VIII. Unclear whether this was present previously given the marked motion degradation on the earlier study. Close attention on follow-up recommended. 3. 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 Subtraction postcontrast imaging suggests that there may be some enhancement in the thrombus suggesting tumor thrombus or a combination of tumor and bland thrombus. 4. Interval increase in size of the hepatoduodenal ligament lymph node measuring 2.0 x 1.7 cm. Likely metastatic disease,  continued close attention on follow-up recommended. 5. Markedly distended urinary bladder.   06/29/2021 -  02/14/2022  Chemotherapy   Patient is on Treatment Plan : LUNG Atezolizumab + Bevacizumab q21d Maintenance     11/28/2021 Imaging   EXAM: CT ABDOMEN AND PELVIS WITH CONTRAST  IMPRESSION: 1. Cirrhotic morphology of the liver with redemonstrated post procedural findings of right hepatic lobe embolization resulting in substantial atrophy of the right lobe of the liver. 2. Generally unchanged appearance of a hypoenhancing mass in the peripheral right lobe following embolization, without convincing evidence of residual or recurrent contrast enhancement. 3. No new suspicious contrast enhancement in the liver. 4. No significant change in an enlarged hepatoduodenal or portacaval lymph node measuring 3.5 x 2.5 cm, likely a nodal metastasis. 5. Additional smaller, nonspecific porta hepatis and left retroperitoneal lymph nodes. Attention on follow-up. 6. Prostatomegaly with thickening of the urinary bladder wall and hyperenhancement of the mucosa. Correlate for urinalysis evidence of infectious or inflammatory cystitis   Aortic Atherosclerosis (ICD10-I70.0).   03/06/2022 Imaging   EXAM: CT ABDOMEN AND PELVIS WITH CONTRAST  IMPRESSION: 1. Cirrhotic morphology of the liver. Unchanged appearance of a hypoenhancing mass of the posterior right lobe of the liver without arterial hyperenhancement following radioembolization. The right portal vein remains occluded. 2. No new arterial hyperenhancement in the liver. 3. Significant interval enlargement of a bulky porta hepatis node, measuring 5.4 x 3.8 cm, previously 3.5 x 2.5 cm, consistent with worsened nodal metastatic disease. Unchanged smaller porta hepatis and left retroperitoneal lymph nodes, which remain nonspecific although suspicious for metastases. 4. Thickening and hyperenhancement of the urinary bladder, again suggestive of nonspecific infectious or inflammatory cystitis, although possibly related to chronic outlet obstruction. Correlate with  urinalysis if clinically referable signs and symptoms are present.   07/25/2022 Imaging   CT AP IMPRESSION: 1. Decreased conspicuity of the previously radioembolized ill-defined mass in the posterior right lobe of the liver without arterial hyperenhancement to suggest residual/recurrent disease on this motion degraded examination. Right portal vein remains occluded. 2. Cirrhotic hepatic morphology without new suspicious arterially enhancing hepatic lesion identified. 3. Decreased size with increased central necrosis in the large portacaval lymph node metastasis. Unchanged size of the smaller porta hepatis and left retroperitoneal lymph nodes. 4. Similar thickening and hyperenhancement of the urinary bladder wall possibly reflecting a combination of infectious/inflammatory cystitis and chronic outflow impedance consider further evaluation with cystoscopy if not previously performed as an underlying bladder malignancy can not be excluded. 5. Large volume of formed stool throughout the colon as can be seen with constipation. 6.  Aortic Atherosclerosis (ICD10-I70.0).     08/28/2022 Imaging   ABD MRI IMPRESSION: 1. Today's MR examination is significantly limited by extensive breath motion artifact throughout, particularly multiphasic contrast enhanced sequences of primary interest. Given this limitation, multiphasic contrast enhanced CT is most likely the test of choice for future follow-up. 2. Within this limitation, no significant change in morphologic appearance of an ill-defined mass in the posterior right lobe of the liver, which is severely atrophic following treatment. There does appear to be some diffuse, ill-defined contrast enhancement throughout the remnant right lobe of the liver, both on arterial phase and later contrast phases which is not clearly focal to the known lesion and most likely reflects post treatment hyperemia. 3. A small hypoenhancing lesion seen by prior CT examinations in the  posterior left lobe of the liver is unchanged and hypoenhancing, measuring 0.9 x 0.8 cm. This is not of fluid character on this examination (i.e. not likely a cyst as previously suggested), although incompletely characterized. Attention on follow-up. 4. Significant interval enlargement  of at least one left retroperitoneal lymph node, consistent with worsened nodal metastatic disease. Previously seen bulky portacaval node is unchanged. 5. Cirrhosis.     CURRENT THERAPY:  Lenvatinib 12 mg daily, Prescribed ~03/13/22 but noncompliant until ~September/October 2023    INTERVAL HISTORY: Mr. Windt returns for follow-up as a scheduled, last seen by me 09/24/22. He continues Lenvima 12 mg daily ***   REVIEW OF SYSTEMS:   Constitutional: Denies fevers, chills or abnormal weight loss Eyes: Denies blurriness of vision Ears, nose, mouth, throat, and face: Denies mucositis or sore throat Respiratory: Denies cough, dyspnea or wheezes Cardiovascular: Denies palpitation, chest discomfort or lower extremity swelling Gastrointestinal:  Denies nausea, heartburn or change in bowel habits Skin: Denies abnormal skin rashes Lymphatics: Denies new lymphadenopathy or easy bruising Neurological:Denies numbness, tingling or new weaknesses Behavioral/Psych: Mood is stable, no new changes  All other systems were reviewed with the patient and are negative.  MEDICAL HISTORY:  Past Medical History:  Diagnosis Date   BPH (benign prostatic hyperplasia)    Cirrhosis ([place])    Hypertension    Splenomegaly     SURGICAL HISTORY: Past Surgical History:  Procedure Laterality Date   BIOPSY  08/22/2020   Procedure: BIOPSY;  Surgeon: Ronnette Juniper, MD;  Location: [facility];  Service: Gastroenterology;;   COLONOSCOPY WITH PROPOFOL N/A 08/22/2020   Procedure: COLONOSCOPY WITH PROPOFOL;  Surgeon: Ronnette Juniper, MD;  Location: [facility];  Service: Gastroenterology;  Laterality: N/A;   ESOPHAGOGASTRODUODENOSCOPY (EGD) WITH  PROPOFOL N/A 08/22/2020   Procedure: ESOPHAGOGASTRODUODENOSCOPY (EGD) WITH PROPOFOL;  Surgeon: Ronnette Juniper, MD;  Location: [facility];  Service: Gastroenterology;  Laterality: N/A;   IR ANGIOGRAM SELECTIVE EACH ADDITIONAL VESSEL  03/01/2021   IR ANGIOGRAM SELECTIVE EACH ADDITIONAL VESSEL  03/08/2021   IR ANGIOGRAM VISCERAL SELECTIVE  03/01/2021   IR ANGIOGRAM VISCERAL SELECTIVE  03/01/2021   IR ANGIOGRAM VISCERAL SELECTIVE  03/08/2021   IR EMBO TUMOR ORGAN ISCHEMIA INFARCT INC GUIDE ROADMAPPING  03/08/2021   IR RADIOLOGIST EVAL & MGMT  02/06/2021   IR RADIOLOGIST EVAL & MGMT  05/31/2021   IR RADIOLOGIST EVAL & MGMT  09/03/2022   IR US GUIDE VASC ACCESS RIGHT  03/01/2021   IR US GUIDE VASC ACCESS RIGHT  03/08/2021   UPPER GASTROINTESTINAL ENDOSCOPY  03/2018   [place] --candidiasis plus esophageal varices grade 2.  Treated with fluconazole and status post esophageal banding.   US ECHOCARDIOGRAPHY  10/2016   per overseas records from [place] - LVEF 68%; mildly calcified aortic valve, normal function otherwise    I have reviewed the social history and family history with the patient and they are unchanged from previous note.  ALLERGIES:  is allergic to pork-derived products.  MEDICATIONS:  Current Outpatient Medications  Medication Sig Dispense Refill   amLODipine (NORVASC) 10 MG tablet Take 1 tablet (10 mg total) by mouth daily. 90 tablet 3   Bismuth/Metronidaz/Tetracyclin (PYLERA) 140-125-125 MG CAPS Take 3 tablets by mouth in the morning, at noon, in the evening, and at bedtime. 120 capsule 0   diclofenac Sodium (VOLTAREN) 1 % GEL Apply to L shoulder area 174 g 1   folic acid (FOLVITE) 1 MG tablet Take 1 tablet by mouth daily. 30 tablet 0   lactulose (CHRONULAC) 10 GM/15ML solution Take 15 mLs (10 g total) by mouth daily as needed for severe constipation or moderate constipation. 450 mL 1   lenvatinib 12 mg daily dose (LENVIMA, 12 MG DAILY DOSE,) 3 x 4 MG capsule Take 12  mg by mouth  daily. Take as instructed by MD 90 capsule 1   levothyroxine (SYNTHROID) 100 MCG tablet Take 1 tablet (100 mcg total) by mouth every morning. 30 minutes before food 90 tablet 3   nitrofurantoin, macrocrystal-monohydrate, (MACROBID) 100 MG capsule Take 1 capsule (100 mg total) by mouth 2 (two) times daily. 10 capsule 0   Olopatadine HCl (PAZEO) 0.7 % SOLN Use once daily in both eyes (Patient not taking: Reported on 04/24/2022) 2.5 mL 0   omeprazole (PRILOSEC) 40 MG capsule Take 1 capsule (40 mg total) by mouth 2 (two) times daily. 60 capsule 0   tamsulosin (FLOMAX) 0.4 MG CAPS capsule Take 1 capsule (0.4 mg total) by mouth daily. Please pack and mail to home (Patient not taking: Reported on 05/12/2022) 90 capsule 3   No current facility-administered medications for this visit.   Facility-Administered Medications Ordered in Other Visits  Medication Dose Route Frequency Provider Last Rate Last Admin   sodium chloride flush (NS) 0.9 % injection 10 mL  10 mL Intracatheter PRN Truitt Merle, MD   10 mL at 09/19/21 1604    PHYSICAL EXAMINATION: ECOG PERFORMANCE STATUS: {CHL ONC ECOG [phone]}  There were no vitals filed for this visit. There were no vitals filed for this visit.  GENERAL:alert, no distress and comfortable SKIN: skin color, texture, turgor are normal, no rashes or significant lesions EYES: normal, Conjunctiva are pink and non-injected, sclera clear OROPHARYNX:no exudate, no erythema and lips, buccal mucosa, and tongue normal  NECK: supple, thyroid normal size, non-tender, without nodularity LYMPH:  no palpable lymphadenopathy in the cervical, axillary or inguinal LUNGS: clear to auscultation and percussion with normal breathing effort HEART: regular rate & rhythm and no murmurs and no lower extremity edema ABDOMEN:abdomen soft, non-tender and normal bowel sounds Musculoskeletal:no cyanosis of digits and no clubbing  NEURO: alert & oriented x 3 with fluent speech, no focal  motor/sensory deficits  LABORATORY DATA:  I have reviewed the data as listed    Latest Ref Rng & Units 09/24/2022    1:22 PM 08/02/2022    9:45 AM 06/20/2022    1:13 PM  CBC  WBC 4.0 - 10.5 K/uL 4.2  3.1  2.8   Hemoglobin 13.0 - 17.0 g/dL 14.3  12.3  12.1   Hematocrit 39.0 - 52.0 % 39.1  36.6  35.0   Platelets 150 - 400 K/uL 138  93  86         Latest Ref Rng & Units 09/24/2022    1:22 PM 08/02/2022    9:45 AM 06/20/2022    1:13 PM  CMP  Glucose 70 - 99 mg/dL 100  100  118   BUN 8 - 23 mg/dL '12  11  13   '$ Creatinine 0.61 - 1.24 mg/dL 0.97  0.84  0.74   Sodium 135 - 145 mmol/L 132  135  138   Potassium 3.5 - 5.1 mmol/L 4.1  4.1  3.9   Chloride 98 - 111 mmol/L 97  107  108   CO2 22 - 32 mmol/L '28  24  26   '$ Calcium 8.9 - 10.3 mg/dL 9.7  8.9  9.3   Total Protein 6.5 - 8.1 g/dL 9.4  7.9  8.3   Total Bilirubin 0.3 - 1.2 mg/dL 1.1  0.7  0.8   Alkaline Phos 38 - 126 U/L 160  137  141   AST 15 - 41 U/L 48  80  67   ALT 0 - 44  U/L 22  51  43       RADIOGRAPHIC STUDIES: I have personally reviewed the radiological images as listed and agreed with the findings in the report. No results found.   ASSESSMENT & PLAN:  No problem-specific Assessment & Plan notes found for this encounter.   No orders of the defined types were placed in this encounter.  All questions were answered. The patient knows to call the clinic with any problems, questions or concerns. No barriers to learning was detected. I spent {CHL ONC TIME VISIT - GHWEX:[id number]} counseling the patient face to face. The total time spent in the appointment was {CHL ONC TIME VISIT - FYBOF:[id number]} and more than 50% was on counseling and review of test results     Alla Feeling, NP 10/23/22

## 2022-10-24 ENCOUNTER — Emergency Department (HOSPITAL_COMMUNITY): Payer: Medicaid Other

## 2022-10-24 ENCOUNTER — Emergency Department (HOSPITAL_COMMUNITY)
Admission: EM | Admit: 2022-10-24 | Discharge: 2022-10-24 | Disposition: A | Discharge status: Home / Self Care | Payer: Medicaid Other | Attending: Emergency Medicine | Admitting: Emergency Medicine

## 2022-10-24 ENCOUNTER — Telehealth: Payer: Self-pay

## 2022-10-24 DIAGNOSIS — N3000 Acute cystitis without hematuria: Secondary | ICD-10-CM | POA: Diagnosis not present

## 2022-10-24 DIAGNOSIS — C220 Liver cell carcinoma: Secondary | ICD-10-CM | POA: Diagnosis not present

## 2022-10-24 DIAGNOSIS — R109 Unspecified abdominal pain: Secondary | ICD-10-CM | POA: Diagnosis present

## 2022-10-24 DIAGNOSIS — C799 Secondary malignant neoplasm of unspecified site: Secondary | ICD-10-CM

## 2022-10-24 LAB — PROTIME-INR
INR: 1.2 (ref 0.8–1.2)
Prothrombin Time: 15.1 seconds (ref 11.4–15.2)

## 2022-10-24 LAB — CBC WITH DIFFERENTIAL/PLATELET
Abs Immature Granulocytes: 0.01 10*3/uL (ref 0.00–0.07)
Basophils Absolute: 0 10*3/uL (ref 0.0–0.1)
Basophils Relative: 0 %
Eosinophils Absolute: 0 10*3/uL (ref 0.0–0.5)
Eosinophils Relative: 0 %
HCT: 40.3 % (ref 39.0–52.0)
Hemoglobin: 14.2 g/dL (ref 13.0–17.0)
Immature Granulocytes: 0 %
Lymphocytes Relative: 9 %
Lymphs Abs: 0.4 10*3/uL — ABNORMAL LOW (ref 0.7–4.0)
MCH: 34.7 pg — ABNORMAL HIGH (ref 26.0–34.0)
MCHC: 35.2 g/dL (ref 30.0–36.0)
MCV: 98.5 fL (ref 80.0–100.0)
Monocytes Absolute: 0.4 10*3/uL (ref 0.1–1.0)
Monocytes Relative: 11 %
Neutro Abs: 3.1 10*3/uL (ref 1.7–7.7)
Neutrophils Relative %: 80 %
Platelets: 110 10*3/uL — ABNORMAL LOW (ref 150–400)
RBC: 4.09 MIL/uL — ABNORMAL LOW (ref 4.22–5.81)
RDW: 13 % (ref 11.5–15.5)
WBC: 3.9 10*3/uL — ABNORMAL LOW (ref 4.0–10.5)
nRBC: 0 % (ref 0.0–0.2)

## 2022-10-24 LAB — URINALYSIS, ROUTINE W REFLEX MICROSCOPIC
Bilirubin Urine: NEGATIVE
Glucose, UA: NEGATIVE mg/dL
Hgb urine dipstick: NEGATIVE
Ketones, ur: NEGATIVE mg/dL
Nitrite: POSITIVE — AB
Protein, ur: 100 mg/dL — AB
Specific Gravity, Urine: >1.046 — ABNORMAL HIGH (ref 1.005–1.030)
WBC, UA: >50 WBC/hpf — ABNORMAL HIGH (ref 0–5)
pH: 6 (ref 5.0–8.0)

## 2022-10-24 LAB — COMPREHENSIVE METABOLIC PANEL
ALT: 42 U/L (ref 0–44)
AST: 67 U/L — ABNORMAL HIGH (ref 15–41)
Albumin: 3.2 g/dL — ABNORMAL LOW (ref 3.5–5.0)
Alkaline Phosphatase: 156 U/L — ABNORMAL HIGH (ref 38–126)
Anion gap: 8 (ref 5–15)
BUN: 17 mg/dL (ref 8–23)
CO2: 29 mmol/L (ref 22–32)
Calcium: 9.4 mg/dL (ref 8.9–10.3)
Chloride: 93 mmol/L — ABNORMAL LOW (ref 98–111)
Creatinine, Ser: 0.72 mg/dL (ref 0.61–1.24)
GFR, Estimated: >60 mL/min (ref >60)
Glucose, Bld: 132 mg/dL — ABNORMAL HIGH (ref 70–99)
Potassium: 3.6 mmol/L (ref 3.5–5.1)
Sodium: 130 mmol/L — ABNORMAL LOW (ref 135–145)
Total Bilirubin: 1 mg/dL (ref 0.3–1.2)
Total Protein: 9 g/dL — ABNORMAL HIGH (ref 6.5–8.1)

## 2022-10-24 LAB — LIPASE, BLOOD: Lipase: 32 U/L (ref 11–51)

## 2022-10-24 LAB — AMMONIA: Ammonia: 30 umol/L (ref 9–35)

## 2022-10-24 MED ORDER — SODIUM CHLORIDE 0.9 % IV SOLN: INTRAVENOUS | Status: DC

## 2022-10-24 MED ORDER — CEPHALEXIN 500 MG PO CAPS: 500.0000 mg | ORAL_CAPSULE | Freq: Three times a day (TID) | ORAL | 0 refills | Status: AC

## 2022-10-24 MED ORDER — SODIUM CHLORIDE 0.9 % IV BOLUS
500.0000 mL | Freq: Once | INTRAVENOUS | Status: AC
  Administered 2022-10-24: 500 mL via INTRAVENOUS

## 2022-10-24 MED ORDER — HYDROCODONE-ACETAMINOPHEN 5-325 MG PO TABS: 1.0000 | ORAL_TABLET | Freq: Four times a day (QID) | ORAL | 0 refills | Status: DC | PRN

## 2022-10-24 MED ORDER — IOHEXOL 300 MG/ML  SOLN
100.0000 mL | Freq: Once | INTRAMUSCULAR | Status: AC | PRN
  Administered 2022-10-24: 100 mL via INTRAVENOUS

## 2022-10-24 MED ORDER — CEPHALEXIN 500 MG PO CAPS
500.0000 mg | ORAL_CAPSULE | Freq: Once | ORAL | Status: AC
  Administered 2022-10-24: 500 mg via ORAL
  Filled 2022-10-24: qty 1

## 2022-10-24 MED ORDER — SODIUM CHLORIDE (PF) 0.9 % IJ SOLN
INTRAMUSCULAR | Status: AC
  Filled 2022-10-24: qty 50

## 2022-10-24 NOTE — ED Notes (Signed)
Patient has a urine culture in the main lab 

## 2022-10-24 NOTE — Telephone Encounter (Signed)
Patient`s spouse called me stated that patient is very weak. He is not eating or drinking much and would like to go to emergency room. He had one soda yesterday and another today. Spouse feels like he needs to be given fluids to rehydrate him.He is having difficulties walking. EMS called.  [name] Elysse Polidore RN BSN [place] Nurse [phone]

## 2022-10-24 NOTE — Discharge Instructions (Signed)
Take the antibiotics as prescribed.  The medications as needed for pain.  Follow-up with your cancer doctors as planned.

## 2022-10-24 NOTE — Progress Notes (Signed)
This encounter was created in error - please disregard.

## 2022-10-24 NOTE — ED Notes (Signed)
Pts ride dorthy called back and said she is unable to come get him due to being at work. Patient is going to try and call son

## 2022-10-24 NOTE — ED Notes (Signed)
Discharge paperwork explained with interpreter with no questions. Pts ride called with no response. Patient is attempting to call other family members.

## 2022-10-24 NOTE — ED Triage Notes (Signed)
Pt BIB [facility] with reports of abdominal pain, constipation, and unable to eat x 1 week.

## 2022-10-24 NOTE — ED Provider Notes (Signed)
[place] DEPT Provider Note   CSN: [id number] Arrival date & time: 10/24/22  1055     History  Chief Complaint  Patient presents with   Abdominal Pain    Jim Little is a 76 y.o. male.   Abdominal Pain  Patient has history of cirrhosis hypertension splenomegaly who presents to the ED for evaluation of abdominal pain.  Patient states he has been having trouble with abdominal pain mostly on the left side this past week.  He feels like something is sharp and stabbing him.  He is not been able to have a normal bowel movement.  He is had poor appetite and able to eat.  He denies any fevers.  No vomiting.  Patient denies any history of chronic abdominal pain issues.  Records reviewed however and patient has hepatocellular carcinoma.  Patient is followed in the oncology clinic.  Patient had an MRI of his abdomen in October of this year.  Patient noted to have persistent mass in the liver which is atrophic following treatment.  Patient also noted to have enlargement of retroperitoneal lymph nodes concerning for worsening nodal metastatic disease.    Home Medications Prior to Admission medications   Medication Sig Start Date End Date Taking? Authorizing Provider  cephALEXin (KEFLEX) 500 MG capsule Take 1 capsule (500 mg total) by mouth 3 (three) times daily for 7 days. 10/24/22 10/31/22 Yes Dorie Rank, MD  HYDROcodone-acetaminophen (NORCO/VICODIN) 5-325 MG tablet Take 1 tablet by mouth every 6 (six) hours as needed. 10/24/22  Yes Dorie Rank, MD  amLODipine (NORVASC) 10 MG tablet Take 1 tablet (10 mg total) by mouth daily. 03/15/22   [name], MD  Bismuth/Metronidaz/Tetracyclin [facility]) [phone] MG CAPS Take 3 tablets by mouth in the morning, at noon, in the evening, and at bedtime. 05/23/22   [name], MD  diclofenac Sodium (VOLTAREN) 1 % GEL Apply to L shoulder area 09/11/22   [name], MD  folic acid (FOLVITE) 1 MG tablet Take 1 tablet by  mouth daily. 05/17/22   Florencia Reasons, MD  lactulose (CHRONULAC) 10 GM/15ML solution Take 15 mLs (10 g total) by mouth daily as needed for severe constipation or moderate constipation. 07/31/22   Truitt Merle, MD  lenvatinib 12 mg daily dose (LENVIMA, 12 MG DAILY DOSE,) 3 x 4 MG capsule Take 12 mg by mouth daily. Take as instructed by MD 05/22/22   Truitt Merle, MD  levothyroxine (SYNTHROID) 100 MCG tablet Take 1 tablet (100 mcg total) by mouth every morning. 30 minutes before food 09/25/22   [name], MD  nitrofurantoin, macrocrystal-monohydrate, (MACROBID) 100 MG capsule Take 1 capsule (100 mg total) by mouth 2 (two) times daily. 08/02/22   Leanord Asal K, DO  Olopatadine HCl (PAZEO) 0.7 % SOLN Use once daily in both eyes Patient not taking: Reported on 04/24/2022 11/01/21   [name], MD  omeprazole (PRILOSEC) 40 MG capsule Take 1 capsule (40 mg total) by mouth 2 (two) times daily. 05/23/22   [name], MD  tamsulosin (FLOMAX) 0.4 MG CAPS capsule Take 1 capsule (0.4 mg total) by mouth daily. Please pack and mail to home Patient not taking: Reported on 05/12/2022 01/31/21   [name], MD      Allergies    Pork-derived products    Review of Systems   Review of Systems  Gastrointestinal:  Positive for abdominal pain.    Physical Exam Updated Vital Signs BP 134/72   Pulse  88   Temp 98 F (36.7 C) (Oral)   Resp 18   SpO2 100%  Physical Exam Vitals and nursing note reviewed.  Constitutional:      General: He is not in acute distress.    Appearance: He is well-developed.  HENT:     Head: Normocephalic and atraumatic.     Right Ear: External ear normal.     Left Ear: External ear normal.     Mouth/Throat:     Mouth: Mucous membranes are dry.  Eyes:     General: No scleral icterus.       Right eye: No discharge.        Left eye: No discharge.     Conjunctiva/sclera: Conjunctivae normal.  Neck:     Trachea: No tracheal deviation.  Cardiovascular:     Rate and Rhythm:  Normal rate and regular rhythm.  Pulmonary:     Effort: Pulmonary effort is normal. No respiratory distress.     Breath sounds: Normal breath sounds. No stridor. No wheezing or rales.  Abdominal:     General: Bowel sounds are normal. There is no distension.     Palpations: Abdomen is soft.     Tenderness: There is abdominal tenderness in the left upper quadrant and left lower quadrant. There is no guarding or rebound.  Musculoskeletal:        General: No tenderness or deformity.     Cervical back: Neck supple.  Skin:    General: Skin is warm and dry.     Findings: No rash.  Neurological:     General: No focal deficit present.     Mental Status: He is alert.     Cranial Nerves: No cranial nerve deficit (no facial droop, extraocular movements intact, no slurred speech).     Sensory: No sensory deficit.     Motor: No abnormal muscle tone or seizure activity.     Coordination: Coordination normal.  Psychiatric:        Mood and Affect: Mood normal.     ED Results / Procedures / Treatments   Labs (all labs ordered are listed, but only abnormal results are displayed) Labs Reviewed  COMPREHENSIVE METABOLIC PANEL - Abnormal; Notable for the following components:      Result Value   Sodium 130 (*)    Chloride 93 (*)    Glucose, Bld 132 (*)    Total Protein 9.0 (*)    Albumin 3.2 (*)    AST 67 (*)    Alkaline Phosphatase 156 (*)    All other components within normal limits  CBC WITH DIFFERENTIAL/PLATELET - Abnormal; Notable for the following components:   WBC 3.9 (*)    RBC 4.09 (*)    MCH 34.7 (*)    Platelets 110 (*)    Lymphs Abs 0.4 (*)    All other components within normal limits  URINALYSIS, ROUTINE W REFLEX MICROSCOPIC - Abnormal; Notable for the following components:   APPearance HAZY (*)    Specific Gravity, Urine >1.046 (*)    Protein, ur 100 (*)    Nitrite POSITIVE (*)    Leukocytes,Ua LARGE (*)    WBC, UA >50 (*)    Bacteria, UA MANY (*)    All other components  within normal limits  URINE CULTURE  LIPASE, BLOOD  AMMONIA  PROTIME-INR    EKG None  Radiology CT ABDOMEN PELVIS W CONTRAST  Result Date: 10/24/2022 CLINICAL DATA:  Hepatocellular carcinoma, abdominal pain, anorexia EXAM: CT ABDOMEN  AND PELVIS WITH CONTRAST TECHNIQUE: Multidetector CT imaging of the abdomen and pelvis was performed using the standard protocol following bolus administration of intravenous contrast. RADIATION DOSE REDUCTION: This exam was performed according to the departmental dose-optimization program which includes automated exposure control, adjustment of the mA and/or kV according to patient size and/or use of iterative reconstruction technique. CONTRAST:  100 mL OMNIPAQUE IOHEXOL 300 MG/ML  SOLN COMPARISON:  07/25/2022. FINDINGS: Lower chest: Innumerable nodules of varying sizes identified in both lower lungs consistent with metastatic disease. Mild cardiomegaly. No pericardial or pleural effusion. Hepatobiliary: There is evidence of partial hepatic resection with a mass in the residual right lobe that is hypodense with peripheral enhancement. This measures 7 x 5 x 5 cm. Another small hypodense lesion identified in the anterior right lobe measures 1 cm. The remainder of the liver demonstrates a nodular contour suggestive of cirrhosis. No intrahepatic ductal dilatation is identified. Pancreas: Unremarkable. No pancreatic ductal dilatation or surrounding inflammatory changes. Spleen: Mildly prominent, 14 cm with no focal lesions. Adrenals/Urinary Tract: Adrenal glands are unremarkable. Kidneys are normal, without renal calculi, focal lesion, or hydronephrosis. Bladder abnormal with wall thickening consistent with inflammation, hypertrophy or neoplasm. Stomach/Bowel: Stomach is within normal limits. Appendix appears normal. No evidence of bowel wall thickening, distention, or inflammatory changes. Vascular/Lymphatic: No evidence of abdominal aortic aneurysm. There is massive  retroperitoneal para-aortic and aortocaval adenopathy identified. Con fluid mass measures up to 10 cm diameter. This represents a significant change compared to the prior study. Reproductive: Prostate is unremarkable. Other: No abdominal wall hernia or abnormality. No abdominopelvic ascites. Musculoskeletal: No acute or significant osseous findings. There are thoracolumbosacral degenerative changes IMPRESSION: 1. Cirrhotic appearance of the liver with at least 2 lesions 1 of which is up to 7 cm in diameter. 2. Massive malignant-appearing retroperitoneal para-aortic/aortocaval adenopathy. 3. Innumerable metastatic lesions in the lungs representing progression. 4. Urinary bladder wall thickening that is nonspecific. Electronically Signed   By: Sammie Bench M.D.   On: 10/24/2022 14:12    Procedures Procedures    Medications Ordered in ED Medications  sodium chloride 0.9 % bolus 500 mL (0 mLs Intravenous Stopped 10/24/22 1256)    And  0.9 %  sodium chloride infusion (has no administration in time range)  sodium chloride (PF) 0.9 % injection (has no administration in time range)  cephALEXin (KEFLEX) capsule 500 mg (has no administration in time range)  iohexol (OMNIPAQUE) 300 MG/ML solution 100 mL (100 mLs Intravenous Contrast Given 10/24/22 1338)    ED Course/ Medical Decision Making/ A&P Clinical Course as of 10/24/22 1615  Thu Oct 24, 2022  1253 CBC with Diff(!) White blood cell count and platelet count decreased.  No anemia [JK]  1253 Comprehensive metabolic panel(!) Sodium level decreased, 1 month ago sodium level is 132 [JK]  1426 CT scan shows findings consistent with his known malignancy.  No acute abnormality.  Nonspecific bladder thickening noted.  Will correlate with urinalysis [JK]  1531 Urinalysis is suggestive of UTI [JK]    Clinical Course User Index [JK] Dorie Rank, MD                           Medical Decision Making Problems Addressed: Acute cystitis without  hematuria: acute illness or injury Metastatic malignant neoplasm, unspecified site [facility]): chronic illness or injury with exacerbation, progression, or side effects of treatment  Amount and/or Complexity of Data Reviewed External Data Reviewed: radiology and notes.    Details:  Outpatient imaging and radiology notes reviewed Labs: ordered. Decision-making details documented in ED Course. Radiology: ordered and independent interpretation performed.  Risk Prescription drug management.   Pt presents with abdominal pain.  [place] Optometrist used.  Labs notable for uti.  CT scan shows findings consistent wit his known malignancy and possible bladder wall thickening.  Pt in no distress.  No signs of systemic infection.  Will dc home with oral abx for uti and pain meds.  Outpt follow up with oncology.        Final Clinical Impression(s) / ED Diagnoses Final diagnoses:  Acute cystitis without hematuria  Metastatic malignant neoplasm, unspecified site [facility])    Rx / DC Orders ED Discharge Orders          Ordered    cephALEXin (KEFLEX) 500 MG capsule  3 times daily        10/24/22 1606    HYDROcodone-acetaminophen (NORCO/VICODIN) 5-325 MG tablet  Every 6 hours PRN        10/24/22 1607              Dorie Rank, MD 10/24/22 1615

## 2022-10-26 LAB — URINE CULTURE: Culture: >=100000 — AB

## 2022-10-27 ENCOUNTER — Telehealth (HOSPITAL_BASED_OUTPATIENT_CLINIC_OR_DEPARTMENT_OTHER): Payer: Self-pay | Admitting: *Deleted

## 2022-10-27 NOTE — Telephone Encounter (Signed)
Post ED Visit - Positive Culture Follow-up: Successful Patient Follow-Up  Culture assessed and recommendations reviewed by:  '[]'$  Elenor Quinones, Pharm.D. '[]'$  Heide Guile, Pharm.D., BCPS AQ-ID '[]'$  Parks Neptune, Pharm.D., BCPS '[]'$  Alycia Rossetti, Pharm.D., BCPS '[]'$  [place], Pharm.D., BCPS, AAHIVP '[]'$  Legrand Como, Pharm.D., BCPS, AAHIVP '[]'$  Salome Arnt, PharmD, BCPS '[]'$  Johnnette Gourd, PharmD, BCPS '[]'$  Hughes Better, PharmD, BCPS '[x]'$ Westley Gambles, PharmD  Positive urine culture  '[]'$  Patient discharged without antimicrobial prescription and treatment is now indicated '[x]'$  Organism is resistant to prescribed ED discharge antimicrobial '[]'$  Patient with positive blood cultures  Changes discussed with ED provider: Sherrill Raring, PA  Pt symptomatic and patients contact stated patient is not compliant with some medications and requests the new antibiotic  New antibiotic prescription Fosfomycin 3g x 1 Stop Keflex  Called to [facility] 10/27/22  Spoke with patients contact [name], date 10/27/22, time 0955   Rosie Fate 10/27/2022, 9:57 AM

## 2022-10-27 NOTE — Progress Notes (Signed)
ED Antimicrobial Stewardship Positive Culture Follow Up   Jim Little is an 76 y.o. male who presented to [facility] on 10/24/2022 with a chief complaint of  Chief Complaint  Patient presents with   Abdominal Pain    Recent Results (from the past 720 hour(s))  Urine Culture     Status: Abnormal   Collection Time: 10/24/22  4:04 PM   Specimen: Urine, Clean Catch  Result Value Ref Range Status   Specimen Description   Final    URINE, CLEAN CATCH Performed at [facility] [address], [place], [place] [address]    Special Requests   Final    NONE Performed at [facility], [place] [address] [place], [place] [address]    Culture (A)  Final    >=100,000 COLONIES/mL ESCHERICHIA COLI Confirmed Extended Spectrum Beta-Lactamase Producer (ESBL).  In bloodstream infections from ESBL organisms, carbapenems are preferred over piperacillin/tazobactam. They are shown to have a lower risk of mortality.    Report Status 10/26/2022 FINAL  Final   Organism ID, Bacteria ESCHERICHIA COLI (A)  Final      Susceptibility   Escherichia coli - MIC*    AMPICILLIN >=32 RESISTANT Resistant     CEFAZOLIN >=64 RESISTANT Resistant     CEFEPIME 16 RESISTANT Resistant     CEFTRIAXONE >=64 RESISTANT Resistant     CIPROFLOXACIN >=4 RESISTANT Resistant     GENTAMICIN >=16 RESISTANT Resistant     IMIPENEM <=0.25 SENSITIVE Sensitive     NITROFURANTOIN 32 SENSITIVE Sensitive     TRIMETH/SULFA >=320 RESISTANT Resistant     AMPICILLIN/SULBACTAM 16 INTERMEDIATE Intermediate     PIP/TAZO <=4 SENSITIVE Sensitive     * >=100,000 COLONIES/mL ESCHERICHIA COLI    '[x]'$  Treated with Keflex, organism resistant to prescribed antimicrobial  Perform symptom check - if patient improved, do not recommend further treatment as pt appears colonized with ESBL E.coli (positive cultures March, June, September, November)  ONLY if patient symptomatic (dysuria, frequency, etc) then see below for  Rx  New antibiotic prescription: fosfomycin 3 g PO x 1 and stop Keflex  ED Provider: Sherrill Raring, PA-C   Tawnya Crook, PharmD, BCPS Clinical Pharmacist 10/27/2022 8:49 AM

## 2022-10-28 ENCOUNTER — Other Ambulatory Visit (HOSPITAL_COMMUNITY): Payer: Self-pay

## 2022-10-28 MED ORDER — FOSFOMYCIN TROMETHAMINE 3 G PO PACK
3.0000 g | PACK | ORAL | 0 refills | Status: DC
  Filled 2022-10-28: qty 1, 1d supply, fill #0

## 2022-10-29 ENCOUNTER — Other Ambulatory Visit (HOSPITAL_COMMUNITY): Payer: Self-pay

## 2022-10-29 NOTE — Congregational Nurse Program (Signed)
  Dept: [phone]   Congregational Nurse Program Note  Date of Encounter: 10/29/2022  Past Medical History: Past Medical History:  Diagnosis Date   BPH (benign prostatic hyperplasia)    Cirrhosis ([place])    Hypertension    Splenomegaly     Encounter Details:  CNP Questionnaire - 10/29/22 1800       Questionnaire   Ask client: Do you give verbal consent for me to treat you today? Yes    Student Assistance N/A    [place] Served  [facility]    Visit Setting with Client Home    Patient Status Refugee    Insurance Medicaid    Insurance/Financial Assistance Referral N/A    Medication Have Medication Insecurities;Provided Medication Assistance    Medical Provider Yes    Screening Referrals Made N/A    Medical Referrals Made N/A    Medical Appointment Made N/A    Recently w/o PCP, now 1st time PCP visit completed due to CNs referral or appointment made N/A    Food N/A    Transportation Need transportation assistance;Referred to transportation service    Housing/Utilities N/A    Interpersonal Safety N/A    Interventions Advocate/Support;Navigate Healthcare System;Case Management;Counsel;Educate;Reviewed Medications    Abnormal to Normal Screening Since Last CN Visit N/A    Screenings CN Performed N/A    Sent Client to Lab for: N/A    Did client attend any of the following based off CNs referral or appointments made? Medical;Transportation;Medication Assistance    ED Visit Averted Yes    Life-Saving Intervention Made N/A           Medication Fosfomycin 3g x 1 picked up from [address] and delivered to home. Patient advised to stop Keflex. Son and daughter in law educated on Fosfomycin administration and verbalized understanding. I did not see the patient because he was already asleep. Will continue to assist as need.  [name] Persephanie Laatsch RN BSN [place] Nurse [phone]

## 2022-11-01 ENCOUNTER — Other Ambulatory Visit (HOSPITAL_COMMUNITY): Payer: Self-pay

## 2022-11-06 NOTE — Congregational Nurse Program (Signed)
  Dept: [phone]   Congregational Nurse Program Note  Date of Encounter: 11/06/2022  Past Medical History: Past Medical History:  Diagnosis Date   BPH (benign prostatic hyperplasia)    Cirrhosis ([place])    Hypertension    Splenomegaly     Encounter Details:  CNP Questionnaire - 11/06/22 1422       Questionnaire   Ask client: Do you give verbal consent for me to treat you today? Yes    Student Assistance N/A    Location Patient Served  [facility]    Visit Setting with Client Phone/Text/Email    Patient Status Refugee    Insurance Medicaid    Insurance/Financial Assistance Referral N/A    Medication Have Medication Insecurities;Provided Medication Assistance    Medical Provider Yes    Screening Referrals Made N/A    Medical Referrals Made N/A    Medical Appointment Made N/A    Recently w/o PCP, now 1st time PCP visit completed due to CNs referral or appointment made N/A    Food N/A    Transportation Need transportation assistance;Referred to transportation service    Housing/Utilities N/A    Interpersonal Safety N/A    Interventions Advocate/Support;Navigate Healthcare System;Case Management;Counsel;Educate;Reviewed Medications    Abnormal to Normal Screening Since Last CN Visit N/A    Screenings CN Performed N/A    Sent Client to Lab for: N/A    Did client attend any of the following based off CNs referral or appointments made? Medical;Transportation;Medication Assistance    ED Visit Averted Yes    Life-Saving Intervention Made N/A             Spouse called me and reported that patient had a fall today. No injuries reported.Patient does not want to go to ER. PCP informed. I will try to have him scheduled with PCP for follow up. I have informed wife that patient will need to be accompanied to medical appointments due to increased risks for falls.  [name] Aaisha Sliter RN BSN [facility] [place] Nurse [phone]

## 2022-11-07 ENCOUNTER — Other Ambulatory Visit (HOSPITAL_COMMUNITY): Payer: Self-pay

## 2022-11-07 ENCOUNTER — Other Ambulatory Visit: Payer: Self-pay | Admitting: Hematology

## 2022-11-07 ENCOUNTER — Encounter (HOSPITAL_COMMUNITY): Payer: Self-pay

## 2022-11-07 ENCOUNTER — Telehealth: Payer: Self-pay | Admitting: Family Medicine

## 2022-11-07 MED ORDER — LACTULOSE 10 GM/15ML PO SOLN
10.0000 g | Freq: Every day | ORAL | 1 refills | Status: DC | PRN
  Filled 2022-11-07: qty 473, 30d supply, fill #0

## 2022-11-07 NOTE — Telephone Encounter (Signed)
Received notice from family (wife) and Congregational RN [name] that patient has been declining at home. Has had decreased PO intake and another fall at home. Recommended ED evaluation, family declines. Has follow up next week in clinic but given concern for falls at  home and inability of wife to care for him and their child with a disability, will direct admit.   Please page Family Medicine at [phone] when patient arrives on floor.    [name], MD  Family Medicine Teaching Service

## 2022-11-07 NOTE — Congregational Nurse Program (Addendum)
  Dept: [phone]   Congregational Nurse Program Note  Date of Encounter: 11/07/2022  Past Medical History: Past Medical History:  Diagnosis Date   BPH (benign prostatic hyperplasia)    Cirrhosis ([place])    Hypertension    Splenomegaly     Encounter Details:  CNP Questionnaire - 11/07/22 2007       Questionnaire   Ask client: Do you give verbal consent for me to treat you today? Yes    Student Assistance N/A    Location Patient Served  [facility]    Visit Setting with Client Phone/Text/Email;Home    Patient Status Refugee    Insurance Medicaid    Insurance/Financial Assistance Referral N/A    Medication Have Medication Insecurities;Provided Medication Assistance    Medical Provider Yes    Screening Referrals Made N/A    Medical Referrals Made N/A    Medical Appointment Made [facility] PCP/clinic    Recently w/o PCP, now 1st time PCP visit completed due to CNs referral or appointment made N/A    Food N/A    Transportation Need transportation assistance;Provided transportation assistance    Housing/Utilities N/A    Interpersonal Safety N/A    Interventions Advocate/Support;Navigate Healthcare System;Case Management;Counsel;Educate;Spiritual Care    Abnormal to Normal Screening Since Last CN Visit N/A    Screenings CN Performed N/A    Sent Client to Lab for: N/A    Did client attend any of the following based off CNs referral or appointments made? N/A    ED Visit Averted Yes    Life-Saving Intervention Made N/A           Patient is at home waiting for inpatient bed availability. He declines to go ER due to long wait time.He has called me tearful and requesting to speak to clergy of Muslim faith.  [facility] [place] without a response. I will continue to follow up.  Patient is c/o abdominal pain with constipation. He is out of lactulose.Pharmacy contacted for refill. Pharmacy has  reached out to MD for refill. I will pick up and deliver to his house when ready.  Meanwhile, I have delivered dulcolax OTC to the patient. He has used dulcolax previously with good results.He prefers dulcolax tablets to lactulose.   6pm- Received a call from [facility] [phone]. He will come visit and pray with patient at the hospital.  8pm- I have called patient placement and no bed is available yet.  [name] Chelcy Bolda RN BSN [place] Nurse [phone]

## 2022-11-08 ENCOUNTER — Telehealth: Payer: Self-pay

## 2022-11-08 ENCOUNTER — Other Ambulatory Visit (HOSPITAL_COMMUNITY): Payer: Self-pay

## 2022-11-08 ENCOUNTER — Observation Stay (HOSPITAL_COMMUNITY)
Admission: RE | Admit: 2022-11-08 | Discharge: 2022-11-11 | Disposition: A | Discharge status: Hospice | Payer: Medicaid Other | Source: Ambulatory Visit | Attending: Family Medicine | Admitting: Family Medicine

## 2022-11-08 DIAGNOSIS — R109 Unspecified abdominal pain: Secondary | ICD-10-CM | POA: Diagnosis present

## 2022-11-08 DIAGNOSIS — R627 Adult failure to thrive: Secondary | ICD-10-CM | POA: Insufficient documentation

## 2022-11-08 DIAGNOSIS — E032 Hypothyroidism due to medicaments and other exogenous substances: Secondary | ICD-10-CM | POA: Diagnosis present

## 2022-11-08 DIAGNOSIS — C787 Secondary malignant neoplasm of liver and intrahepatic bile duct: Secondary | ICD-10-CM | POA: Diagnosis not present

## 2022-11-08 DIAGNOSIS — I10 Essential (primary) hypertension: Secondary | ICD-10-CM | POA: Diagnosis present

## 2022-11-08 DIAGNOSIS — W19XXXA Unspecified fall, initial encounter: Secondary | ICD-10-CM | POA: Insufficient documentation

## 2022-11-08 DIAGNOSIS — E038 Other specified hypothyroidism: Secondary | ICD-10-CM | POA: Diagnosis not present

## 2022-11-08 DIAGNOSIS — R531 Weakness: Secondary | ICD-10-CM | POA: Diagnosis not present

## 2022-11-08 DIAGNOSIS — R339 Retention of urine, unspecified: Secondary | ICD-10-CM | POA: Diagnosis present

## 2022-11-08 DIAGNOSIS — C220 Liver cell carcinoma: Secondary | ICD-10-CM | POA: Diagnosis present

## 2022-11-08 DIAGNOSIS — Z79899 Other long term (current) drug therapy: Secondary | ICD-10-CM | POA: Diagnosis not present

## 2022-11-08 DIAGNOSIS — E871 Hypo-osmolality and hyponatremia: Secondary | ICD-10-CM | POA: Insufficient documentation

## 2022-11-08 DIAGNOSIS — Z66 Do not resuscitate: Secondary | ICD-10-CM | POA: Diagnosis not present

## 2022-11-08 DIAGNOSIS — Z515 Encounter for palliative care: Secondary | ICD-10-CM

## 2022-11-08 NOTE — H&P (Incomplete)
Hospital Admission History and Physical Service Pager: [phone]  Patient name: Jim Little Medical record number: [id number] Date of Birth: [birth year] Age: 76 y.o. Gender: male  Primary Care Provider: [name], MD Consultants: Palliative Code Status: Full  Preferred Emergency Contact:   Contact Information     Name Relation Home Work Mobile   [name],[name] Other [phone]  [phone]   Galen Daft Spouse [phone]  [phone]   [name] [phone]  [phone]   Elisah, Parmer [phone]  [phone]        Chief Complaint: Pain everywhere  Assessment and Plan: [name] is a 76 y.o. male presenting as direct admit for decreased p.o. intake and decline at home in the setting of hepatocellular carcinoma.  Pertinent past medical history includes hepatocellular carcinoma, hypertension, urinary retention due to BPH, GERD, portal vein thrombosis.  * Hepatocellular carcinoma [facility]) Patient is direct admit per Dr. Owens Shark for functional decline due to hepatocellular carcinoma. Per chart review <76monthprognosis. Patient has had decreased oral intake with increased risk of falls.  Patient's main concern is generalized pain throughout his body and sharp pain with eating. -Admit to family medicine teaching service, attending Dr. 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 Hx of multiple urinary tract infections.  -Hold home Tamsulosin, patient reports not taking, initiate as needed -Bladder scan q8h  Essential hypertension Blood pressure elevated.  Patient asymptomatic.  Will continue to monitor.  Per chart review blood pressure is often elevated when patient travels. -Continue home amlodipine 10  mg -Monitor vitals  Drug-induced hypothyroidism -Continue home Synthroid 100 mcg tablets daily   FEN/GI: Regular diet as tolerated VTE Prophylaxis: Lovenox  Disposition: MedSurg  History of Present Illness:  Jim Roepkeis a 76y.o. male presenting as a direct admit for his metastatic hepatocellular carcinoma   Patient believes he is admitted to help with the extreme pain he is having all over his body. Endorses drinking less, some water and soda. States he is losing weight, skin is shrinking. Not able to rate pain, states he is always in pain. Endorses nausea, denies emesis. States he has not had falls, just stumbles sometimes when he uses the restroom but has not fallen. Patient requests for someone to pray with him, nurse states she will call the person on the chart he is requesting.   Patient brought bag of medications with him but does not know any medication names.  Patient stated he would want chest compressions and intubation if needed.   Review Of Systems: Per HPI with the following additions: Has abdominal pain, no chest pain or shortness of breath, chronic constipation, no burning with urination, difficult for patient to pee.  Pertinent Past Medical History: BPH Cirrhosis Hypertension Splenomegaly Hepatocellular carcinoma  Remainder reviewed in history tab.   Pertinent Past Surgical History: Multiple cancer related to IR procedures over the past 3 years.  Remainder reviewed in history tab.  Pertinent Social History: Tobacco use: No Alcohol use: No Other Substance use: No Lives with wife, walks with walker  Pertinent Family History: Daughter with epilepsy. Remainder reviewed in history tab.   Important Outpatient Medications: Amlodipine 10 mg Norco 1 tablet every 6 hours as needed Lactulose 15 mL as needed for constipation Lenvima 12 mg daily Levothyroxine 100 mcg daily  Omeprazole 40 mg daily twice a day, patient not taking Tamsulosin 0.4 mg daily, patient  not taking Remainder reviewed in medication history.   Objective: BP (!) 195/88 (BP Location: Right Arm)   Pulse 86   Wt 58.5 kg   SpO2 99%   BMI 20.82 kg/m  Exam: General: A&O, NAD, lying comfortably in hospital bed HEENT: No sign of trauma, EOM grossly intact, moist mucous membranes Cardiac: RRR, no m/r/g Respiratory: CTAB, normal WOB, no w/c/r GI: Soft, right upper quadrant tenderness, non-distended, no rebound or guarding Extremities: NTTP, no peripheral edema. Neuro: Moves all four extremities appropriately. Psych: Appropriate mood and affect   Labs:   CBC Pending CMP Pending  EKG: None   Imaging Studies Performed:  None   Salvadore Oxford, MD 11/09/2022, 1:30 AM PGY-1, [place] Intern pager: [phone], text pages welcome Secure chat group [place] Upper-Level Resident Addendum   I have independently interviewed and examined the patient. I have discussed the above with the original author and agree with their documentation. My edits for correction/addition/clarification are included. Please see also any attending notes.   Sonia Side, D.O. PGY-3, [place] [facility] 11/09/2022 1:39 AM  FPTS Service pager: [phone] (text pages welcome through [facility])

## 2022-11-08 NOTE — Telephone Encounter (Signed)
Received a call from [facility] patient placement. Inpatient bed is available 5N20. Patient and family made aware. Patient stated that he is very weak and unable to stand. He has a fall 2 days ago.EMS called to assist with transportation to hospital.  Honor Loh RN BSN [place] Nurse [phone]

## 2022-11-08 NOTE — Progress Notes (Signed)
This encounter was created in error - please disregard.

## 2022-11-08 NOTE — Progress Notes (Signed)
Patient has arrived to unit. Provider to come evaluate.

## 2022-11-08 NOTE — H&P (Incomplete)
     Hospital Admission History and Physical Service Pager: [phone]  Patient name: Jim Little Medical record number: [id number] Date of Birth: 1945-12-25 Age: 76 y.o. Gender: male  Primary Care Provider: [name], MD Consultants: *** Code Status: *** which was confirmed with family if patient unable to confirm ***  Preferred Emergency Contact: ***  Chief Complaint: ***  Assessment and Plan: Jim Little is a 76 y.o. male presenting with *** . Differential for this patient's presentation of this includes ***.  (***describe here why less likely than primary diagnosis-delete this once complete***)  No notes have been filed under this hospital service. Service: Family Medicine      FEN/GI: *** VTE Prophylaxis: ***  Disposition: ***  History of Present Illness:  Jim Little is a 76 y.o. male presenting as a direct admit for his metastatic hepatocell  Patient believes he is admitted to help with the extreme pain he is having all over his body. Endorses drinking less, some water and soda. States he is losing weight, skin is shrinking. Not able to rate pain, states he is always in pain. Endorses nausea, denies emesis. States he has not had falls, just stumbles sometimes when he uses the restroom but has not fallen  Patient brought bag of medications with him    Review Of Systems: Per HPI with the following additions: ***  Pertinent Past Medical History: *** (*** review and detail significant medical history here***) Remainder reviewed in history tab.   Pertinent Past Surgical History: ***  Remainder reviewed in history tab.  Pertinent Social History: Tobacco use: Yes/No/Former Alcohol use: *** Other Substance use: *** Lives with ***  Pertinent Family History: ***  Remainder reviewed in history tab.   Important Outpatient Medications: *** Remainder reviewed in medication history.   Objective: There were no vitals taken for this visit. Exam: General:  *** Eyes: *** ENTM: *** Neck: *** Cardiovascular: *** Respiratory: *** Gastrointestinal: *** MSK: *** Derm: *** Neuro: *** Psych: ***  Labs:  CBC BMET  No results for input(s): "WBC", "HGB", "HCT", "PLT" in the last 168 hours. No results for input(s): "NA", "K", "CL", "CO2", "BUN", "CREATININE", "GLUCOSE", "CALCIUM" in the last 168 hours.  Pertinent additional labs ***.  EKG: My own interpretation (not copied from electronic read) ***    Imaging Studies Performed:  Imaging Study (ie. Chest x-ray) Impression from Radiologist: ***   My Interpretation: Jim Oxford, MD 11/08/2022, 11:44 PM PGY-1, [place] Intern pager: [phone], text pages welcome Secure chat group [place]

## 2022-11-09 ENCOUNTER — Encounter (HOSPITAL_COMMUNITY): Payer: Self-pay | Admitting: Family Medicine

## 2022-11-09 ENCOUNTER — Observation Stay (HOSPITAL_COMMUNITY): Payer: Medicaid Other

## 2022-11-09 ENCOUNTER — Other Ambulatory Visit: Payer: Self-pay

## 2022-11-09 ENCOUNTER — Telehealth: Payer: Self-pay

## 2022-11-09 DIAGNOSIS — C787 Secondary malignant neoplasm of liver and intrahepatic bile duct: Secondary | ICD-10-CM | POA: Diagnosis not present

## 2022-11-09 DIAGNOSIS — C220 Liver cell carcinoma: Secondary | ICD-10-CM | POA: Diagnosis not present

## 2022-11-09 LAB — CBC
HCT: 34.2 % — ABNORMAL LOW (ref 39.0–52.0)
Hemoglobin: 11.9 g/dL — ABNORMAL LOW (ref 13.0–17.0)
MCH: 34.2 pg — ABNORMAL HIGH (ref 26.0–34.0)
MCHC: 34.8 g/dL (ref 30.0–36.0)
MCV: 98.3 fL (ref 80.0–100.0)
Platelets: 119 10*3/uL — ABNORMAL LOW (ref 150–400)
RBC: 3.48 MIL/uL — ABNORMAL LOW (ref 4.22–5.81)
RDW: 13.8 % (ref 11.5–15.5)
WBC: 4.8 10*3/uL (ref 4.0–10.5)
nRBC: 0 % (ref 0.0–0.2)

## 2022-11-09 LAB — COMPREHENSIVE METABOLIC PANEL
ALT: 29 U/L (ref 0–44)
AST: 51 U/L — ABNORMAL HIGH (ref 15–41)
Albumin: 2.3 g/dL — ABNORMAL LOW (ref 3.5–5.0)
Alkaline Phosphatase: 126 U/L (ref 38–126)
Anion gap: 9 (ref 5–15)
BUN: 8 mg/dL (ref 8–23)
CO2: 25 mmol/L (ref 22–32)
Calcium: 8.4 mg/dL — ABNORMAL LOW (ref 8.9–10.3)
Chloride: 97 mmol/L — ABNORMAL LOW (ref 98–111)
Creatinine, Ser: 0.62 mg/dL (ref 0.61–1.24)
GFR, Estimated: >60 mL/min (ref >60)
Glucose, Bld: 109 mg/dL — ABNORMAL HIGH (ref 70–99)
Potassium: 3 mmol/L — ABNORMAL LOW (ref 3.5–5.1)
Sodium: 131 mmol/L — ABNORMAL LOW (ref 135–145)
Total Bilirubin: 1.4 mg/dL — ABNORMAL HIGH (ref 0.3–1.2)
Total Protein: 7.7 g/dL (ref 6.5–8.1)

## 2022-11-09 MED ORDER — ENSURE ENLIVE PO LIQD
237.0000 mL | Freq: Two times a day (BID) | ORAL | Status: DC
  Administered 2022-11-09 (×2): 237 mL via ORAL

## 2022-11-09 MED ORDER — LEVOTHYROXINE SODIUM 100 MCG PO TABS
100.0000 ug | ORAL_TABLET | Freq: Every day | ORAL | Status: DC
  Administered 2022-11-09 – 2022-11-10 (×2): 100 ug via ORAL
  Filled 2022-11-09 (×3): qty 1

## 2022-11-09 MED ORDER — SENNA 8.6 MG PO TABS
1.0000 | ORAL_TABLET | Freq: Every day | ORAL | Status: DC
  Administered 2022-11-09 – 2022-11-11 (×3): 8.6 mg via ORAL
  Filled 2022-11-09 (×3): qty 1

## 2022-11-09 MED ORDER — ACETAMINOPHEN 325 MG PO TABS: 650.0000 mg | ORAL_TABLET | Freq: Four times a day (QID) | ORAL | Status: DC | PRN

## 2022-11-09 MED ORDER — ENSURE ENLIVE PO LIQD
237.0000 mL | Freq: Three times a day (TID) | ORAL | Status: DC
  Administered 2022-11-09 – 2022-11-11 (×4): 237 mL via ORAL

## 2022-11-09 MED ORDER — ENOXAPARIN SODIUM 40 MG/0.4ML IJ SOSY: 40.0000 mg | PREFILLED_SYRINGE | INTRAMUSCULAR | Status: DC

## 2022-11-09 MED ORDER — LACTULOSE 10 GM/15ML PO SOLN: 10.0000 g | Freq: Every day | ORAL | Status: DC | PRN

## 2022-11-09 MED ORDER — TAMSULOSIN HCL 0.4 MG PO CAPS
0.4000 mg | ORAL_CAPSULE | Freq: Every day | ORAL | Status: DC
  Administered 2022-11-09 – 2022-11-10 (×2): 0.4 mg via ORAL
  Filled 2022-11-09 (×2): qty 1

## 2022-11-09 MED ORDER — ACETAMINOPHEN 325 MG PO TABS
650.0000 mg | ORAL_TABLET | Freq: Four times a day (QID) | ORAL | Status: DC
  Administered 2022-11-09 – 2022-11-11 (×9): 650 mg via ORAL
  Filled 2022-11-09 (×9): qty 2

## 2022-11-09 MED ORDER — AMLODIPINE BESYLATE 10 MG PO TABS
10.0000 mg | ORAL_TABLET | Freq: Every day | ORAL | Status: DC
  Administered 2022-11-09 – 2022-11-11 (×3): 10 mg via ORAL
  Filled 2022-11-09 (×3): qty 1

## 2022-11-09 MED ORDER — OXYCODONE HCL 5 MG PO TABS
5.0000 mg | ORAL_TABLET | Freq: Four times a day (QID) | ORAL | Status: DC | PRN
  Administered 2022-11-09 – 2022-11-10 (×3): 5 mg via ORAL
  Filled 2022-11-09 (×3): qty 1

## 2022-11-09 MED ORDER — FOLIC ACID 1 MG PO TABS
1.0000 mg | ORAL_TABLET | Freq: Every day | ORAL | Status: DC
  Administered 2022-11-09 – 2022-11-11 (×3): 1 mg via ORAL
  Filled 2022-11-09 (×3): qty 1

## 2022-11-09 MED ORDER — LACTULOSE 10 GM/15ML PO SOLN
10.0000 g | Freq: Two times a day (BID) | ORAL | Status: DC
  Administered 2022-11-09 – 2022-11-10 (×2): 10 g via ORAL
  Filled 2022-11-09 (×2): qty 15

## 2022-11-09 MED ORDER — POTASSIUM CHLORIDE 20 MEQ PO PACK
40.0000 meq | PACK | ORAL | Status: AC
  Administered 2022-11-09 (×2): 40 meq via ORAL
  Filled 2022-11-09 (×2): qty 2

## 2022-11-09 MED ORDER — LENVATINIB (12 MG DAILY DOSE) 3 X 4 MG PO CPPK: 12.0000 mg | ORAL_CAPSULE | Freq: Every day | ORAL | Status: DC

## 2022-11-09 MED ORDER — LACTULOSE 10 GM/15ML PO SOLN
10.0000 g | Freq: Every day | ORAL | Status: DC
  Administered 2022-11-09: 10 g via ORAL
  Filled 2022-11-09: qty 15

## 2022-11-09 MED ORDER — LACTATED RINGERS IV SOLN: INTRAVENOUS | Status: DC

## 2022-11-09 MED ORDER — ADULT MULTIVITAMIN W/MINERALS CH
1.0000 | ORAL_TABLET | Freq: Every day | ORAL | Status: DC
  Administered 2022-11-09 – 2022-11-11 (×3): 1 via ORAL
  Filled 2022-11-09 (×3): qty 1

## 2022-11-09 NOTE — Assessment & Plan Note (Signed)
Hypertensive, asymptomatic. -Continue home amlodipine 10 mg -Monitor vitals

## 2022-11-09 NOTE — Progress Notes (Signed)
Called and spoke with Dr. Chryl Heck. Patient has levatinib with him--unclear if taking regularly (will confirm with family). Discussed CT findings, Dr. Chryl Heck recommends messaging Dr. Burr Medico.Will do so and follow up as appropriate.  [name], MD  Family Medicine Teaching Service

## 2022-11-09 NOTE — Assessment & Plan Note (Addendum)
Patient is direct admit per Dr. Owens Shark for functional decline due to hepatocellular carcinoma. Per chart review <71monthprognosis. Patient has had decreased oral intake with increased risk of falls.  Patient's main concern is generalized pain throughout his body and sharp pain with eating. -Admit to family medicine teaching service, attending Dr. [place]wens Shark [place]-Palliative consult -Pain control: -Tylenol 650 scheduled every 6 hours -Oxycodone 5 mg every 6 hours as needed -Continue home Lactulose -Alter diet as needed for pain -Ensures daily -Consider Oncology consult -Continue home Lenvima

## 2022-11-09 NOTE — Progress Notes (Signed)
Initial Nutrition Assessment  DOCUMENTATION CODES:   Not applicable  INTERVENTION:   -Increase Ensure Enlive po TID, each supplement provides 350 kcal and 20 grams of protein -MVI with minerals daily -Encourage PO intake and allow for outside foods and culturally familiar foods  NUTRITION DIAGNOSIS:   Increased nutrient needs related to cancer and cancer related treatments as evidenced by estimated needs.  GOAL:   Patient will meet greater than or equal to 90% of their needs  MONITOR:   PO intake, Supplement acceptance  REASON FOR ASSESSMENT:   Consult Assessment of nutrition requirement/status  ASSESSMENT:   Pt with history of metastatic HCC, hypothyroidism from medication, BPH with frequent UTI, and HTN presenting with abdominal pain, weakness, and falls at home.  Pt admitted with hepatocellular carcinoma.   Reviewed I/O's: -100 ml since admission   Pt unavailable at time of visit. Attempted to speak with pt via call to hospital room phone, however, unable to reach. RD unable to obtain further nutrition-related history or complete nutrition-focused physical exam at this time.    Per H&P, pt has experienced a general decline in health over the past 2 weeks, including limited oral intake, constipation, and abdominal pain.   Reviewed wt hx; pt has experienced a 8.7% wt loss over the past 6 weeks, which is significant for time frame. Highly suspect pt with malnutrition, however, unable to identify at this time.   Pt followed by [place] RD, but last seen in 01/2022. Pt likes Ensure supplements and drinks them.   Palliative care consulted for goals of care discussions.   Medications reviewed and include folic acid, lactulose, lenvatinib, levothyroxine, potassium chloride, , and lactated ringers infusion @ 100 ml/hr.   Labs reviewed: Na: 131, K: 3.    Diet Order:   Diet Order             Diet regular Room service appropriate? No; Fluid consistency: Thin  Diet  effective now                   EDUCATION NEEDS:   No education needs have been identified at this time  Skin:  Skin Assessment: Reviewed RN Assessment  Last BM:  11/04/22  Height:   Ht Readings from Last 1 Encounters:  06/20/22 '5\' 6"'$  (1.676 m)    Weight:   Wt Readings from Last 1 Encounters:  11/09/22 58.5 kg    Ideal Body Weight:  70 kg  BMI:  Body mass index is 20.82 kg/m.  Estimated Nutritional Needs:   Kcal:  1800-2000  Protein:  100-115 grams  Fluid:  > 1.8 L    Loistine Chance, RD, LDN, [place] Registered Dietitian II Certified Diabetes Care and Education Specialist Please refer to [facility] for RD and/or RD on-call/weekend/after hours pager

## 2022-11-09 NOTE — Progress Notes (Signed)
SLP Cancellation Note  Patient Details Name: Jim Little MRN: [id number] DOB: Jan 02, 1946   Cancelled treatment:       Reason Eval/Treat Not Completed: Patient at procedure or test/unavailable Pt was working with OT. Will return as schedule allows.  Jawuan Robb P. Reyaansh Merlo, M.S., [place] Pathologist Acute Rehabilitation Services Pager: [place] 11/09/2022, 2:44 PM

## 2022-11-09 NOTE — Evaluation (Signed)
Physical Therapy Evaluation Patient Details Name: Jim Little MRN: [id number] DOB: 1946-02-04 Today's Date: 11/09/2022  History of Present Illness  76 yo gentleman presenting on 11/08/22 with weakness, fall at home, constipation.  PMH: cirrhosis, hypertension, urinary retention due to severe BPH, splenomegaly, hepatocellular carcinoma  Clinical Impression  Pt admitted with above diagnosis. Pt presents with generalized weakness and very flat affect, following most commands but ignoring or not understanding others. Required mod A to come to EOB and max A to stand and pivot to chair. He told the interpreter that he feels that his "life is very far away". Based on physical performance today, recommending SNF but do not know if he will agree to this.  Pt currently with functional limitations due to the deficits listed below (see PT Problem List). Pt will benefit from skilled PT to increase their independence and safety with mobility to allow discharge to the venue listed below.          Recommendations for follow up therapy are one component of a multi-disciplinary discharge planning process, led by the attending physician.  Recommendations may be updated based on patient status, additional functional criteria and insurance authorization.  Follow Up Recommendations Skilled nursing-short term rehab (<3 hours/day) Can patient physically be transported by private vehicle: Yes    Assistance Recommended at Discharge Frequent or constant Supervision/Assistance  Patient can return home with the following  Two people to help with walking and/or transfers;A lot of help with bathing/dressing/bathroom;Assistance with cooking/housework;Assist for transportation;Help with stairs or ramp for entrance    Equipment Recommendations Other (comment) (TBD)  Recommendations for Other Services  OT consult    Functional Status Assessment Patient has had a recent decline in their functional status and demonstrates the  ability to make significant improvements in function in a reasonable and predictable amount of time.     Precautions / Restrictions Precautions Precautions: Fall Precaution Comments: pt reports he hasn't fallen at home Restrictions Weight Bearing Restrictions: No      Mobility  Bed Mobility Overal bed mobility: Needs Assistance Bed Mobility: Supine to Sit     Supine to sit: Mod assist     General bed mobility comments: pt states he is too fatigued to move but with encouragement able to come to EOB with mod A at LE's and trunk    Transfers Overall transfer level: Needs assistance Equipment used: Rolling walker (2 wheels), None Transfers: Sit to/from Stand, Bed to chair/wheelchair/BSC Sit to Stand: Max assist   Step pivot transfers: Max assist       General transfer comment: RW placed in front of pt but he was not initiating sit>stand with verbal or tactile cues so RW removed and therapist stood directly in front of pt for increased assist. Max A for power up as well as max A during stepping to recliner with facilitation of wt shifting    Ambulation/Gait               General Gait Details: unable  Stairs            Wheelchair Mobility    Modified Rankin (Stroke Patients Only)       Balance Overall balance assessment: Needs assistance Sitting-balance support: No upper extremity supported, Feet supported Sitting balance-Leahy Scale: Fair     Standing balance support: Bilateral upper extremity supported, During functional activity Standing balance-Leahy Scale: Zero Standing balance comment: needed max A to remain standing  Pertinent Vitals/Pain Pain Assessment Pain Assessment: Faces Faces Pain Scale: Hurts little more Pain Location: generalized Pain Intervention(s): Limited activity within patient's tolerance    Home Living Family/patient expects to be discharged to:: Private residence Living  Arrangements: Spouse/significant other Available Help at Discharge: Family Type of Home: House         Home Layout: Two level;Able to live on main level with bedroom/bathroom Home Equipment: Cane - quad      Prior Function Prior Level of Function : Needs assist             Mobility Comments: pt reports he uses his cane but then states he has not been walking.       Hand Dominance   Dominant Hand: Right    Extremity/Trunk Assessment   Upper Extremity Assessment Upper Extremity Assessment: Generalized weakness    Lower Extremity Assessment Lower Extremity Assessment: Generalized weakness    Cervical / Trunk Assessment Cervical / Trunk Assessment: Kyphotic  Communication   Communication: Prefers language other than English;Interpreter utilized  [employer] Arousal/Alertness: Awake/alert Behavior During Therapy: Flat affect (very flat, seems low in spirit, minimal verbalization with interpreter, sometimes didn't answer her questions) Overall Cognitive Status: No family/caregiver present to determine baseline cognitive functioning                                 General Comments: interpreter states that pt says "my life is very far away" and she doesn't know what he means by this. Followed most commands        General Comments General comments (skin integrity, edema, etc.): MD and interpreter trying to figure out what food pt can eat, he states he cannot eat the chicken but could not confirm if he would eat the vegetables    Exercises     Assessment/Plan    PT Assessment Patient needs continued PT services  PT Problem List Decreased strength;Decreased activity tolerance;Decreased balance;Decreased mobility;Decreased cognition;Decreased coordination;Decreased knowledge of use of DME;Decreased safety awareness;Decreased knowledge of precautions       PT Treatment Interventions DME instruction;Gait training;Functional mobility training;Therapeutic  activities;Therapeutic exercise;Balance training;Patient/family education;Cognitive remediation    PT Goals (Current goals can be found in the Care Plan section)  Acute Rehab PT Goals Patient Stated Goal: none stated PT Goal Formulation: Patient unable to participate in goal setting Time For Goal Achievement: 11/23/22 Potential to Achieve Goals: Fair    Frequency Min 2X/week     Co-evaluation               AM-PAC PT "6 Clicks" Mobility  Outcome Measure Help needed turning from your back to your side while in a flat bed without using bedrails?: A Little Help needed moving from lying on your back to sitting on the side of a flat bed without using bedrails?: A Lot Help needed moving to and from a bed to a chair (including a wheelchair)?: A Lot Help needed standing up from a chair using your arms (e.g., wheelchair or bedside chair)?: A Lot Help needed to walk in hospital room?: Total Help needed climbing 3-5 steps with a railing? : Total 6 Click Score: 11    End of Session   Activity Tolerance: Patient limited by fatigue Patient left: in chair;with call bell/phone within reach;with chair alarm set;with nursing/sitter in room Nurse Communication: Mobility status PT Visit Diagnosis: Unsteadiness on feet (R26.81);Muscle weakness (generalized) (M62.81);Difficulty in walking, not elsewhere classified (R26.2)  Time: 4765-4650 PT Time Calculation (min) (ACUTE ONLY): 27 min   Charges:   PT Evaluation $PT Eval Moderate Complexity: 1 Mod PT Treatments $Therapeutic Activity: 8-22 mins        Leighton Roach, PT  Acute Rehab Services Secure chat preferred Office [place] 11/09/2022, 2:59 PM

## 2022-11-09 NOTE — Progress Notes (Signed)
Patients medication has been counted with charge RN. To be sent down to pharmacy. Will pick up when discharges

## 2022-11-09 NOTE — Telephone Encounter (Signed)
Patient is requesting prayer from a Muslim clergy. [place] [phone] made aware per patient request.  Honor Loh RN BSN [place] Nurse [phone]

## 2022-11-09 NOTE — Assessment & Plan Note (Addendum)
Chronic due to BPH. Hx of multiple urinary tract infections.  -Continue home Flomax -Bladder scan q8h -May need [place] for home care

## 2022-11-09 NOTE — Progress Notes (Signed)
Brief Interim Progress Note   Upon checking on patient in the afternoon patient was working with physical therapy.  He was doing well but would repeatedly say that he was feeling tired.  Sat down in the chair for lunch.  He says that he cannot eat any meat as it is not Halal.  I told patient that we would put in a vegetarian meal order for him.  He says that he is too tired to eat for now and to asked the nurse to just give him food tomorrow.  Asked patient how he was doing and he says he is doing well and he denies any pain.  He says that he does not have much life left.  I say that we will do what we can do to make would like he has comfortable.  He asks to see his Muslim colleagues that he mentioned earlier.  I told him that [name] the social worker was working on getting his Muslim priest to come by.  I told the patient that I would work on getting a Sander Nephew ran for him.  Lowry Ram, MD PGY-1 [place]

## 2022-11-09 NOTE — Progress Notes (Signed)
This encounter was created in error - please disregard.

## 2022-11-09 NOTE — Assessment & Plan Note (Addendum)
TSH 6.67 -Increase to Synthroid 112 mcg tablets daily

## 2022-11-09 NOTE — Progress Notes (Signed)
Family at bedside (Son and child). They have brought food for patient. Informed family to bring chemo medication. Stated they will be able to bring tomorrow.

## 2022-11-10 ENCOUNTER — Telehealth: Payer: Self-pay

## 2022-11-10 DIAGNOSIS — Z66 Do not resuscitate: Secondary | ICD-10-CM

## 2022-11-10 DIAGNOSIS — Z515 Encounter for palliative care: Secondary | ICD-10-CM

## 2022-11-10 DIAGNOSIS — C220 Liver cell carcinoma: Secondary | ICD-10-CM | POA: Diagnosis not present

## 2022-11-10 DIAGNOSIS — E032 Hypothyroidism due to medicaments and other exogenous substances: Secondary | ICD-10-CM

## 2022-11-10 DIAGNOSIS — C787 Secondary malignant neoplasm of liver and intrahepatic bile duct: Secondary | ICD-10-CM | POA: Diagnosis not present

## 2022-11-10 DIAGNOSIS — W19XXXA Unspecified fall, initial encounter: Secondary | ICD-10-CM | POA: Insufficient documentation

## 2022-11-10 DIAGNOSIS — R531 Weakness: Secondary | ICD-10-CM

## 2022-11-10 LAB — COMPREHENSIVE METABOLIC PANEL
ALT: 28 U/L (ref 0–44)
AST: 51 U/L — ABNORMAL HIGH (ref 15–41)
Albumin: 2.1 g/dL — ABNORMAL LOW (ref 3.5–5.0)
Alkaline Phosphatase: 120 U/L (ref 38–126)
Anion gap: 6 (ref 5–15)
BUN: 12 mg/dL (ref 8–23)
CO2: 24 mmol/L (ref 22–32)
Calcium: 8.4 mg/dL — ABNORMAL LOW (ref 8.9–10.3)
Chloride: 103 mmol/L (ref 98–111)
Creatinine, Ser: 0.69 mg/dL (ref 0.61–1.24)
GFR, Estimated: >60 mL/min (ref >60)
Glucose, Bld: 113 mg/dL — ABNORMAL HIGH (ref 70–99)
Potassium: 3.7 mmol/L (ref 3.5–5.1)
Sodium: 133 mmol/L — ABNORMAL LOW (ref 135–145)
Total Bilirubin: 1.2 mg/dL (ref 0.3–1.2)
Total Protein: 7 g/dL (ref 6.5–8.1)

## 2022-11-10 LAB — CBC
HCT: 31.2 % — ABNORMAL LOW (ref 39.0–52.0)
Hemoglobin: 11 g/dL — ABNORMAL LOW (ref 13.0–17.0)
MCH: 35 pg — ABNORMAL HIGH (ref 26.0–34.0)
MCHC: 35.3 g/dL (ref 30.0–36.0)
MCV: 99.4 fL (ref 80.0–100.0)
Platelets: 132 10*3/uL — ABNORMAL LOW (ref 150–400)
RBC: 3.14 MIL/uL — ABNORMAL LOW (ref 4.22–5.81)
RDW: 14.3 % (ref 11.5–15.5)
WBC: 4 10*3/uL (ref 4.0–10.5)
nRBC: 0 % (ref 0.0–0.2)

## 2022-11-10 LAB — TSH: TSH: 6.667 u[IU]/mL — ABNORMAL HIGH (ref 0.350–4.500)

## 2022-11-10 MED ORDER — OXYCODONE HCL 5 MG PO TABS
5.0000 mg | ORAL_TABLET | Freq: Four times a day (QID) | ORAL | Status: DC | PRN
  Filled 2022-11-10: qty 1

## 2022-11-10 MED ORDER — OXYCODONE HCL 5 MG PO TABS
5.0000 mg | ORAL_TABLET | Freq: Four times a day (QID) | ORAL | Status: DC
  Administered 2022-11-10 – 2022-11-11 (×4): 5 mg via ORAL
  Filled 2022-11-10 (×2): qty 1

## 2022-11-10 MED ORDER — OXYCODONE HCL 5 MG PO TABS: 5.0000 mg | ORAL_TABLET | Freq: Four times a day (QID) | ORAL | Status: DC

## 2022-11-10 MED ORDER — LACTULOSE 10 GM/15ML PO SOLN
10.0000 g | Freq: Three times a day (TID) | ORAL | Status: DC
  Administered 2022-11-10 – 2022-11-11 (×3): 10 g via ORAL
  Filled 2022-11-10 (×3): qty 15

## 2022-11-10 MED ORDER — LEVOTHYROXINE SODIUM 112 MCG PO TABS
112.0000 ug | ORAL_TABLET | Freq: Every day | ORAL | Status: DC
  Administered 2022-11-11: 112 ug via ORAL
  Filled 2022-11-10: qty 1

## 2022-11-10 NOTE — Telephone Encounter (Signed)
Jim Little [phone] has called me and confirmed that he will visit with patient this afternoon and offer prayer.Room number provided per patient request.  Honor Loh RN [place] Nurse [phone]

## 2022-11-10 NOTE — Evaluation (Signed)
Clinical/Bedside Swallow Evaluation Patient Details  Name: Jim Little MRN: [id number] Date of Birth: June 24, 1946  Today's Date: 11/10/2022 Time: SLP Start Time (ACUTE ONLY): 1257 SLP Stop Time (ACUTE ONLY): 1308 SLP Time Calculation (min) (ACUTE ONLY): 11 min  Past Medical History:  Past Medical History:  Diagnosis Date   BPH (benign prostatic hyperplasia)    Cirrhosis ([place])    Hypertension    Splenomegaly    Past Surgical History:  Past Surgical History:  Procedure Laterality Date   BIOPSY  08/22/2020   Procedure: BIOPSY;  Surgeon: Ronnette Juniper, MD;  Location: [facility];  Service: Gastroenterology;;   COLONOSCOPY WITH PROPOFOL N/A 08/22/2020   Procedure: COLONOSCOPY WITH PROPOFOL;  Surgeon: Ronnette Juniper, MD;  Location: [facility];  Service: Gastroenterology;  Laterality: N/A;   ESOPHAGOGASTRODUODENOSCOPY (EGD) WITH PROPOFOL N/A 08/22/2020   Procedure: ESOPHAGOGASTRODUODENOSCOPY (EGD) WITH PROPOFOL;  Surgeon: Ronnette Juniper, MD;  Location: [facility];  Service: Gastroenterology;  Laterality: N/A;   IR ANGIOGRAM SELECTIVE EACH ADDITIONAL VESSEL  03/01/2021   IR ANGIOGRAM SELECTIVE EACH ADDITIONAL VESSEL  03/08/2021   IR ANGIOGRAM VISCERAL SELECTIVE  03/01/2021   IR ANGIOGRAM VISCERAL SELECTIVE  03/01/2021   IR ANGIOGRAM VISCERAL SELECTIVE  03/08/2021   IR EMBO TUMOR ORGAN ISCHEMIA INFARCT INC GUIDE ROADMAPPING  03/08/2021   IR RADIOLOGIST EVAL & MGMT  02/06/2021   IR RADIOLOGIST EVAL & MGMT  05/31/2021   IR RADIOLOGIST EVAL & MGMT  09/03/2022   IR US GUIDE VASC ACCESS RIGHT  03/01/2021   IR US GUIDE VASC ACCESS RIGHT  03/08/2021   UPPER GASTROINTESTINAL ENDOSCOPY  03/2018   [place] --candidiasis plus esophageal varices grade 2.  Treated with fluconazole and status post esophageal banding.   US ECHOCARDIOGRAPHY  10/2016   per overseas records from [place] - LVEF 68%; mildly calcified aortic valve, normal function otherwise   HPI:  76 yo gentleman, speaks Swahili, presenting on  11/08/22 with weakness, fall at home, constipation.  PMH: cirrhosis, hypertension, urinary retention due to severe BPH, splenomegaly, hepatocellular carcinoma    Assessment / Plan / Recommendation  Clinical Impression  Pt participated in clinical swallowing assessment with his son present for translation.  He reports no appetite but no difficulty swallowing. Oral mechanism exam was normal. He demonstrated adequate mastication of solids and purees and no s/s of aspiration with any textures or liquids.  No dysphagia identified. Recommend continuing current diet - no SLP f/u is needed. SLP will sign off. SLP Visit Diagnosis: Dysphagia, unspecified (R13.10)    Aspiration Risk  No limitations    Diet Recommendation   Regular solids, thin liquids  Medication Administration: Whole meds with liquid    Other  Recommendations Oral Care Recommendations: Oral care BID    Recommendations for follow up therapy are one component of a multi-disciplinary discharge planning process, led by the attending physician.  Recommendations may be updated based on patient status, additional functional criteria and insurance authorization.  Follow up Recommendations No SLP follow up        [place] Date of Onset: 11/09/22 HPI: 76 yo gentleman presenting on 11/08/22 with weakness, fall at home, constipation.  PMH: cirrhosis, hypertension, urinary retention due to severe BPH, splenomegaly, hepatocellular carcinoma Type of Study: Bedside Swallow Evaluation Previous Swallow Assessment: June 2021 Diet Prior to this Study: Regular;Thin liquids Temperature Spikes Noted: No Respiratory Status: Room air History of Recent Intubation: No Behavior/Cognition: Alert;Cooperative Oral Cavity Assessment: Within Functional Limits Oral Care Completed by SLP: No Oral  Cavity - Dentition: Adequate natural dentition Vision: Functional for self-feeding Self-Feeding Abilities: Able to feed self Patient Positioning:  Upright in bed Baseline Vocal Quality: Normal Volitional Cough: Strong Volitional Swallow: Able to elicit    Oral/Motor/Sensory Function Overall Oral Motor/Sensory Function: Within functional limits   Ice Chips Ice chips: Within functional limits   Thin Liquid Thin Liquid: Within functional limits    Nectar Thick Nectar Thick Liquid: Not tested   Honey Thick Honey Thick Liquid: Not tested   Puree Puree: Within functional limits   Solid     Solid: Within functional limits      Juan Quam Laurice 11/10/2022,1:12 PM  Estill Bamberg L. Tivis Ringer, MA CCC/SLP Clinical Specialist - [place] Office number [phone]

## 2022-11-10 NOTE — Assessment & Plan Note (Signed)
Multiple falls at home recently. CT head negative for acute bleed. -PT/OT consulted

## 2022-11-10 NOTE — Congregational Nurse Program (Signed)
  Dept: [phone]   Congregational Nurse Program Note  Date of Encounter: 11/10/2022  Past Medical History: Past Medical History:  Diagnosis Date   BPH (benign prostatic hyperplasia)    Cirrhosis ([place])    Hypertension    Splenomegaly        Hepatocellular carcinoma  Encounter Details:  CNP Questionnaire - 11/10/22 0759       Questionnaire   Ask client: Do you give verbal consent for me to treat you today? Yes    Student Assistance N/A    [place] Patient Served  [place]    Visit Setting with Client Hospital    Patient Status Refugee    Insurance Medicaid    Insurance/Financial Assistance Referral N/A    Medication Have Medication Insecurities    Medical Provider Yes    Screening Referrals Made N/A    Medical Referrals Made N/A    Medical Appointment Made N/A    Recently w/o PCP, now 1st time PCP visit completed due to CNs referral or appointment made N/A    Food N/A    Transportation Need transportation assistance    Housing/Utilities N/A    Interpersonal Safety N/A    Interventions Advocate/Support;Navigate Healthcare System;Case Management;Counsel;Educate;Spiritual Care    Abnormal to Normal Screening Since Last CN Visit N/A    Screenings CN Performed N/A    Sent Client to Lab for: N/A    Did client attend any of the following based off CNs referral or appointments made? N/A    ED Visit Averted N/A    Life-Saving Intervention Made N/A            Visited with patient to offer support during hospital admission. Son by bedside. He is weaker compared to the last time I saw him at his home. Not eating much. He does not like hospital food. He would like Ugali and fish from home. I will inform his family to bring him some. He does not want me to order anything from cafeteria. He declines ensure.  He still waiting and asking for a visit form Muslim Imam. I have reached out to the contact person in the community and left a message. Hospital chaplain has been consulted to  assist with this as well. Will continue to support as needed.  [name] Shayleen Eppinger RN BSN [place] Nurse [phone]

## 2022-11-10 NOTE — Discharge Instructions (Addendum)
Dear Jim Little,   Thank you for letting us participate in your care! In this section, you will find a brief hospital admission summary of why you were admitted to the hospital, what happened during your admission, your diagnosis/diagnoses, and recommended follow up.  Primary diagnosis: You were seen to establish home care for your liver cancer. Treatment plan: The palliative care team saw you in the hospital and provided supportive services. We spoke with your oncologist who did not recommend further treatment and suggested hospice care   POST-HOSPITAL & CARE INSTRUCTIONS We recommend following up with your PCP within 1 week from being discharged from the hospital. Please let PCP/Specialists know of any changes in medications that were made which you will be able to see in the medications section of this packet.  DOCTOR'S APPOINTMENTS & FOLLOW UP Future Appointments  Date Time Provider [place]  11/15/2022 10:10 AM [name], MD [facility]  11/19/2022  9:30 AM [facility] None  11/19/2022 10:00 AM Alla Feeling, NP [facility] None  12/17/2022  9:30 AM [facility] None  12/17/2022 10:00 AM Alla Feeling, NP [facility] None     Thank you for choosing [facility]! Take care and be well!  [facility]  [place], [place] [address] [phone]

## 2022-11-10 NOTE — Consult Note (Signed)
Consultation Note Date: 11/10/2022 at 1045  Patient Name: Jim Little  DOB: Apr 19, 1946  MRN: [id number]  Age / Sex: 76 y.o., male  PCP: [name], MD Referring Physician: [name], MD  Reason for Consultation: Establishing goals of care  HPI/Patient Profile: 76 y.o. male  with past medical history of metastatic hepatocellular carcinoma, HTN, urinary retention D/T BPH, GERD, and portal vein thrombosis admitted directly by Dr. Owens Shark on 11/08/2022 with decreased p.o. intake, increased risk of falls, and decline at home.   Oncology was consulted and has recommended hospice.  Pain is being managed with scheduled Tylenol and as needed oxycodone.  PMT was consulted to discuss goals of care.  Clinical Assessment and Goals of Care: I have reviewed medical records including EPIC notes, labs and imaging, assessed the patient and then met with patient and his son at bedside to discuss diagnosis prognosis, GOC, EOL wishes, disposition and options.  I utilized a Engineer, manufacturing (ID number [id number]) via interpreter services on an [place].  I introduced Palliative Medicine as specialized medical care for people living with serious illness. It focuses on providing relief from the symptoms and stress of a serious illness. The goal is to improve quality of life for both the patient and the family.  We discussed patient's current illness and what it means in the larger context of patient's on-going co-morbidities.  I outlined that patient has metastatic hepatocellular carcinoma.  Despite medical management, cancer has spread beyond his liver.  It is also causing complications such as low-sodium, abdominal pain, and constipation.    As far as pain control, patient endorsed that he is not in control when he has his medications.  Reviewed that oxycodone is given as needed but that it has been given as of scheduled.  I  recommend continuing with oxycodone every 6 hours to ensure baseline coverage of patient's pain.  I also recommend an as needed dose of oxycodone to be given in addition to this baseline amount for breakthrough pain.  Patient was in agreement that he did not need pain meds at this time.  We also discussed patient's nausea.  He endorses that he does not feel sick on his stomach and less he tries to eat a large amount of food.  He shares that small sips and bites do not make him feel nauseous.  We discussed administering as needed antinausea medications.  Patient was in agreement and declined nausea medication at this time.  Natural disease trajectory discussed. I attempted to elicit values and goals of care important to the patient.  When asked what was important to the patient, he said "it is all in God's hands".  I attempted to further delve into what patient wishes as far as his medical management.  He continued to say "it is up to God and my doctors".  The difference between aggressive medical intervention and comfort care was considered in light of the patient's goals of care.  Discussed oncology's recommendation to enact hospice benefits.  Hospice philosophy and  hospice at home discussed.  Patient and patient's son confirmed they understood that patient will return home for symptom management.  We discussed inpatient placement if patient's symptoms become unmanageable at home.  However, patient was clear that he wanted to go home.  However, patient was adamant that he does not leave the hospital until he speaks with him and mom.  I confirmed and mom phone number and attempted to speak with him over the phone. No answer and HIPAA appropriate VM left with PMT contact info given.   Patient son vocalized that he understands that patient will go home with end-of-life care.  He asked that patient stay in the hospital until he is seen by Endo mom.  Then, patient and family are agreeable to be  discharged.  Education offered regarding concept specific to human mortality and the limitations of medical interventions to prolong life when the body begins to fail to thrive.  Therapeutic silence and active listening provided for patient and his son to share their thoughts and emotions regarding patient's current health situation.  Emotional support provided.   Discussed with patient/family the importance of continued conversation with family and the medical providers regarding overall plan of care and treatment options, ensuring decisions are within the context of the patient's values and GOCs.    After our discussion, I spoke with Dr. Owens Shark over the phone.  I conveyed the above discussion, which aligns with discussions with previous providers.   Questions and concerns were addressed. The family was encouraged to call with questions or concerns. PMT will remain available to patient/family. Goals are clear and primary team is facilitating discharge to home with hospice.   PMT will continue to follow the patient throughout his hospitalization.   Primary Decision Maker PATIENT  Physical Exam Vitals reviewed.  Constitutional:      General: He is not in acute distress.    Appearance: He is not ill-appearing.     Comments: Thin, frail appearing  HENT:     Mouth/Throat:     Mouth: Mucous membranes are moist.  Eyes:     Pupils: Pupils are equal, round, and reactive to light.  Cardiovascular:     Rate and Rhythm: Normal rate.     Pulses: Normal pulses.  Pulmonary:     Effort: Pulmonary effort is normal.  Abdominal:     General: There is distension.     Tenderness: There is no abdominal tenderness.  Musculoskeletal:        General: Normal range of motion.  Skin:    General: Skin is warm and dry.  Neurological:     Mental Status: He is alert and oriented to person, place, and time.  Psychiatric:        Mood and Affect: Mood normal.        Behavior: Behavior normal.        Thought  Content: Thought content normal.        Judgment: Judgment normal.    Palliative Assessment/Data: 50%     Thank you for this consult. Palliative medicine will continue to follow and assist holistically.   Time Total: 90 minutes  Signed by: Jordan Hawks, DNP, FNP-BC Palliative Medicine    Please contact Palliative Medicine Team phone at [phone] for questions and concerns.  For individual provider: See Shea Evans

## 2022-11-10 NOTE — Progress Notes (Signed)
BP 185/100 map 122 morning BP meds already given paged MD at [phone] Dr Jerilee Hoh notified when returned call and no new orders given at this time will monitor

## 2022-11-10 NOTE — Hospital Course (Signed)
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 His hospital course is detailed below:  Metastatic hepatocellular carcinoma Admitted for supportive care and transition to hospice services for metastatic [facility]. Palliative care consulted and patient transitioned to DNR. Abdominal pain likely secondary to mass obstruction and patient pain was managed with sch Tylenol and Oxycodone. Obtained CT head due to recent falls, negative for acute process. Per oncologist, no further treatment is recommended at this time and patient was established with home hospice services.  Other chronic conditions were medically managed with home medications and formulary alternatives as necessary (hypothyroidism, HTN)

## 2022-11-10 NOTE — Progress Notes (Addendum)
FMTS Brief Note The patient speaks Swahili as their primary language.  An interpreter was used for the entire visit.   In to see patient and his son Gertha Calkin) who has come from [place].  Discussed Dr. Ernestina Penna recommendations in light of CT findings, weight loss, and overall prognosis, we discussed her recommendation for hospice care. Discussed concept of hospice care again in plain language, using basic terms. Patient defers many decisions to physicians. Discussed that in [place], he gets to make decision. After discussion, he would like to focus on pain control and return home. He knows his time is short, only request is to pray with Imam. I again discussed code status with the patient. I discussed that his overall likelihood of good neurological recovery (talking and walking) after such an event is very very low. Discussed options. After reviewing options, he again would like to avoid chest compressions or aggressive interventions and chose to be DNR status.  Likely will need continued clarification around these concepts given language barrier and cultural differences in practice of medicine (he is very used to doctors making decisions for him).   [name], MD  Family Medicine Teaching Service

## 2022-11-10 NOTE — Progress Notes (Signed)
     Daily Progress Note Intern Pager: [phone]  Patient name: Jim Little Medical record number: [id number] Date of birth: [birth year] Age: 76 y.o. 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  * Hepatocellular carcinoma ([place]) Direct admitted for functional decline due to metastatic hepatocellular carcinoma with poor prognosis. Per Oncology, no further treatment to pursue and recommending hospice care. Likely some degree of obstruction limiting BM. -Palliative consult, appreciate recs -Oncology consult, recommending hospice -Pain control: Sch tylenol q6h, oxy '5mg'$  q6h -Increase Lactulose TID -Consulted nutrition, Ensures TID and culturally appropriate diet -Continue home Lenvima   Urinary retention Chronic due to BPH. Hx of multiple urinary tract infections.  -Continue home Flomax -Bladder scan q8h -May need Foley for home care  Essential hypertension Hypertensive, asymptomatic. -Continue home amlodipine 10 mg -Monitor vitals  Drug-induced hypothyroidism TSH 6.67 -Increase to Synthroid 112 mcg tablets daily  Fall Multiple falls at home recently. CT head negative for acute bleed. -PT/OT consulted    FEN/GI: Regular diet as tolerated VTE Prophylaxis: Lovenox Dispo: Home pending hospice discussion  Subjective:  Patient assessed at bedside with son present. Patient previously spoke with Dr. Owens Shark about care plan. He reiterated understanding of plan to be DNR and deferred to medical team's judgement. Relates want to go home. Reports he still has not stooled.  Objective: Temp:  [98.3 F (36.8 C)-98.6 F (37 C)] 98.6 F (37 C) (12/17 1035) Pulse Rate:   [91-99] 91 (12/17 1035) Resp:  [14-19] 19 (12/17 1035) BP: (150-185)/(82-100) 185/100 (12/17 1035) SpO2:  [96 %-99 %] 98 % (12/17 1035) Physical Exam: General: Laying comfortably in bed watching TV, NAD Cardiovascular: RRR, no murmur Respiratory: CTAB. Normal WOB on RA Abdomen: Soft, non-distended, non-tender Extremities: No peripheral edema  Laboratory: Most recent CBC Lab Results  Component Value Date   WBC 4.0 11/10/2022   HGB 11.0 (L) 11/10/2022   HCT 31.2 (L) 11/10/2022   MCV 99.4 11/10/2022   PLT 132 (L) 11/10/2022   Most recent BMP    Latest Ref Rng & Units 11/10/2022    3:04 AM  BMP  Glucose 70 - 99 mg/dL 113   BUN 8 - 23 mg/dL 12   Creatinine 0.61 - 1.24 mg/dL 0.69   Sodium 135 - 145 mmol/L 133   Potassium 3.5 - 5.1 mmol/L 3.7   Chloride 98 - 111 mmol/L 103   CO2 22 - 32 mmol/L 24   Calcium 8.9 - 10.3 mg/dL 8.4     Other pertinent labs: TSH: 6.67  Imaging/Diagnostic Tests: CT HEAD WO CONTRAST (5MM)  Result Date: 11/09/2022 IMPRESSION: 1. No acute intracranial abnormality or acute traumatic injury identified. 2. 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  Colletta Maryland, MD 11/10/2022, 1:07 PM  PGY-1, [place] Intern pager: [phone], text pages welcome Secure chat group [place]

## 2022-11-10 NOTE — Progress Notes (Signed)
OT Cancellation Note  Patient Details Name: Jim Little MRN: [id number] DOB: 1946/01/27   Cancelled Treatment:    Reason Eval/Treat Not Completed: Other (comment). Pt sleeping very soundly upon entering room. Tried to call and son who have the same home number and was told that they do no speak [place]. Will re-attempt eval tomorrow when pt is awake and can use interpreter services.  [name], OTR/L Acute Rehab Services [facility] [phone] Office [phone]    Almon Register 11/10/2022, 3:11 PM

## 2022-11-11 ENCOUNTER — Other Ambulatory Visit (HOSPITAL_COMMUNITY): Payer: Self-pay

## 2022-11-11 DIAGNOSIS — C787 Secondary malignant neoplasm of liver and intrahepatic bile duct: Secondary | ICD-10-CM | POA: Diagnosis not present

## 2022-11-11 DIAGNOSIS — Z66 Do not resuscitate: Secondary | ICD-10-CM | POA: Diagnosis not present

## 2022-11-11 DIAGNOSIS — C220 Liver cell carcinoma: Secondary | ICD-10-CM | POA: Diagnosis not present

## 2022-11-11 MED ORDER — OXYCODONE HCL 5 MG PO TABS
5.0000 mg | ORAL_TABLET | Freq: Four times a day (QID) | ORAL | 0 refills | Status: AC
  Filled 2022-11-11: qty 120, 30d supply, fill #0

## 2022-11-11 MED ORDER — LEVOTHYROXINE SODIUM 112 MCG PO TABS
112.0000 ug | ORAL_TABLET | Freq: Every day | ORAL | 0 refills | Status: AC
  Filled 2022-11-11: qty 30, 30d supply, fill #0

## 2022-11-11 MED ORDER — SENNA 8.6 MG PO TABS
1.0000 | ORAL_TABLET | Freq: Every day | ORAL | 0 refills | Status: AC
  Filled 2022-11-11: qty 60, 60d supply, fill #0

## 2022-11-11 MED ORDER — LACTULOSE 10 GM/15ML PO SOLN
10.0000 g | Freq: Three times a day (TID) | ORAL | 0 refills | Status: AC
  Filled 2022-11-11: qty 236, 5d supply, fill #0

## 2022-11-11 NOTE — TOC Transition Note (Addendum)
Transition of Care [facility]) - CM/SW Discharge Note   Patient Details  Name: Kashden Deboy MRN: [id number] Date of Birth: 1945-12-15  [facility] of [facility]) CM/SW Contact:  Tom-Johnson, Renea Ee, RN Phone Number: 11/11/2022, 2:51 PM   Clinical Narrative:     Patient is scheduled for discharge home with Hospice today. Hospice established with [facility] per family's request. CM spoke with [place] and she states they will meet with patient and family at home. CM notified Shanita of DME's needed. [facility] will provide necessary DME's needed. Family to transport at discharge. No further TOC needs noted.  16:30- CM notified by MD that patient's family arrived to transport patient home and waited downstairs then left without patient. Requesting cab voucher. Voucher given to RN and RN to notify family with the aid of an interpreter of the mode of transportation and estimated time of arrival of patient.  No further TOC needs noted.     Final next level of care: Home w Hospice Care Barriers to Discharge: Barriers Resolved   Patient Goals and CMS Choice Patient states their goals for this hospitalization and ongoing recovery are:: To return home CMS Medicare.gov Compare Post Acute Care list provided to:: Patient Choice offered to / list presented to : Patient, Adult Children  Discharge Placement                Patient to be transferred to facility by: Family      Discharge Plan and Services                DME Arranged: N/A DME Agency: NA       HH Arranged: NA HH Agency: NA        Social Determinants of Health (SDOH) Interventions Transportation Interventions: Intervention Not Indicated, Inpatient TOC, Patient Resources (Friends/Family)   Readmission Risk Interventions    05/15/2022    1:37 PM  Readmission Risk Prevention Plan  Post Dischage Appt Complete  Medication Screening Complete  Transportation Screening Complete

## 2022-11-11 NOTE — Progress Notes (Signed)
This chaplain responded to the spiritual care consult for a Quran and Imam visit.  This chaplain understands after reading the Pt. chart notes, speaking to the PMT, and receiving an update from Pt. RN-Deanna, there is still uncertainty to whether a Imam has visited the Pt. for prayer.  The chaplain left a voice mail for F/U with the Congregational RN-[name] with whom contacted [facility]. The chaplain shared a Quran with the Pt.  This chaplain is available for F/U spiritual care as needed.  Chaplain Sallyanne Kuster [phone]

## 2022-11-11 NOTE — Evaluation (Signed)
Occupational Therapy Evaluation Patient Details Name: Jim Little MRN: [id number] DOB: 11/15/1946 Today's Date: 11/11/2022   History of Present Illness 76 yo gentleman presenting on 11/08/22 with weakness, fall at home, constipation.  PMH: cirrhosis, hypertension, urinary retention due to severe BPH, splenomegaly, hepatocellular carcinoma   Clinical Impression   Pt presents w/ deficits in strength, endurance and dynamic standing balance. Pt requires Min A x 2 for safety with BSC transfers using RW, Setup for UB ADL and Min A for LB ADLs due to deficits. Pt shaky in standing, benefiting from RW use for BUE support to decrease fall risk. Pt reported desire to sit on [facility] for a bit so unable to further assess mobility this AM. Noted plan to DC home w/ hospice.      Recommendations for follow up therapy are one component of a multi-disciplinary discharge planning process, led by the attending physician.  Recommendations may be updated based on patient status, additional functional criteria and insurance authorization.   Follow Up Recommendations  No OT follow up (noted current plan is home with hospice)     Assistance Recommended at Discharge Frequent or constant Supervision/Assistance  Patient can return home with the following A little help with walking and/or transfers;A little help with bathing/dressing/bathroom    Functional Status Assessment  Patient has had a recent decline in their functional status and demonstrates the ability to make significant improvements in function in a reasonable and predictable amount of time.  Equipment Recommendations  BSC/3in1;Other (comment) (RW)    Recommendations for Other Services       Precautions / Restrictions Precautions Precautions: Fall Restrictions Weight Bearing Restrictions: No      Mobility Bed Mobility Overal bed mobility: Needs Assistance Bed Mobility: Supine to Sit     Supine to sit: Min assist, HOB elevated     General bed  mobility comments: Rn assisting OOB with bed rail use as well. able to scoot toward EOB with gestures    Transfers Overall transfer level: Needs assistance Equipment used: Rolling walker (2 wheels) Transfers: Sit to/from Stand, Bed to chair/wheelchair/BSC Sit to Stand: Min assist     Step pivot transfers: Min assist, +2 safety/equipment     General transfer comment: able to stand from bedside fairly easily with RW, some LE shakiness toward end of BSC transfer and light assist to advance RW      Balance Overall balance assessment: Needs assistance Sitting-balance support: No upper extremity supported, Feet supported Sitting balance-Leahy Scale: Good     Standing balance support: Bilateral upper extremity supported, During functional activity Standing balance-Leahy Scale: Poor Standing balance comment: benefits from UE support in standing                           ADL either performed or assessed with clinical judgement   ADL Overall ADL's : Needs assistance/impaired Eating/Feeding: Independent   Grooming: Set up;Sitting   Upper Body Bathing: Set up;Sitting   Lower Body Bathing: Minimal assistance;Sit to/from stand   Upper Body Dressing : Set up;Sitting   Lower Body Dressing: Minimal assistance;Sit to/from stand   Toilet Transfer: Minimal assistance;+2 for safety/equipment;+2 for physical assistance;Stand-pivot;BSC/3in1;Rolling walker (2 wheels)   Toileting- Clothing Manipulation and Hygiene: Minimal assistance;Sitting/lateral lean;Sit to/from stand         General ADL Comments: Shakiness in standing but improving from prior PT eval. use of RW for toilet transfers, noted plan for hospice at home currently     Vision  Ability to See in Adequate Light: 0 Adequate Patient Visual Report: No change from baseline Vision Assessment?: No apparent visual deficits     Perception     Praxis      Pertinent Vitals/Pain Pain Assessment Pain Assessment:  No/denies pain     Hand Dominance Right   Extremity/Trunk Assessment Upper Extremity Assessment Upper Extremity Assessment: Generalized weakness   Lower Extremity Assessment Lower Extremity Assessment: Defer to PT evaluation   Cervical / Trunk Assessment Cervical / Trunk Assessment: Kyphotic   Communication Communication Communication: Prefers language other than [place];Interpreter utilized   Cognition Arousal/Alertness: Awake/alert Behavior During Therapy: Flat affect Overall Cognitive Status: No family/caregiver present to determine baseline cognitive functioning                                 General Comments: follows commands, shows some insight into deficits (feeling unsteady) but also reported desire to walk to bathroom rather than use BSC (despite weakness).     General Comments  RN present, pt requesting bathroom assist and RN initiating assist to [facility] on OT entry.    Exercises     Shoulder Instructions      Home Living Family/patient expects to be discharged to:: Private residence Living Arrangements: Spouse/significant other Available Help at Discharge: Family Type of Home: House       Home Layout: Two level;Able to live on main level with bedroom/bathroom               Home Equipment: Cane - quad          Prior Functioning/Environment Prior Level of Function : Needs assist             Mobility Comments: use of cane for mobility          OT Problem List: Decreased strength;Decreased activity tolerance;Impaired balance (sitting and/or standing);Decreased cognition;Decreased knowledge of use of DME or AE      OT Treatment/Interventions: Therapeutic exercise;Self-care/ADL training;Energy conservation;DME and/or AE instruction;Therapeutic activities    OT Goals(Current goals can be found in the care plan section) Acute Rehab OT Goals Patient Stated Goal: have BM this AM OT Goal Formulation: With patient Time For Goal  Achievement: 11/25/22 Potential to Achieve Goals: Good ADL Goals Pt Will Perform Lower Body Bathing: with modified independence;sit to/from stand Pt Will Perform Lower Body Dressing: with modified independence;sit to/from stand;sitting/lateral leans Pt Will Transfer to Toilet: with supervision;ambulating Pt Will Perform Toileting - Clothing Manipulation and hygiene: with modified independence;sit to/from stand;sitting/lateral leans  OT Frequency: Min 2X/week    Co-evaluation              AM-PAC OT "6 Clicks" Daily Activity     Outcome Measure Help from another person eating meals?: None Help from another person taking care of personal grooming?: A Little     Help from another person to put on and taking off regular upper body clothing?: A Little   6 Click Score: 10   End of Session Equipment Utilized During Treatment: Rolling walker (2 wheels) Nurse Communication: Mobility status (RN present during eval)  Activity Tolerance: Patient tolerated treatment well Patient left: with call bell/phone within reach;Other (comment) (on [facility]; educated to use call bell when done)  OT Visit Diagnosis: Unsteadiness on feet (R26.81);Other abnormalities of gait and mobility (R26.89);Muscle weakness (generalized) (M62.81)                Time: 4481-8563 OT Time Calculation (  min): 11 min Charges:  OT General Charges $OT Visit: 1 Visit OT Evaluation $OT Eval Low Complexity: 1 Low  Malachy Chamber, OTR/L Acute Rehab Services Office: [phone]   Layla Maw 11/11/2022, 11:04 AM

## 2022-11-11 NOTE — Discharge Summary (Addendum)
[facility] Discharge Summary  Patient name: Jim Little Medical record number: [id number] Date of birth: Apr 11, 1946 Age: 76 y.o. Gender: male Date of Admission: 11/08/2022  Date of Discharge: 11/11/2022 Admitting Physician: Salvadore Oxford, MD  Primary Care Provider: [name], MD Consultants: Palliative care  Indication for Hospitalization: Hepatocellular Carcinoma  Discharge Diagnoses/Problem List:  Principal Problem:   Hepatocellular carcinoma [facility]) Active Problems:   Urinary retention   Essential hypertension   Drug-induced hypothyroidism   Fall   DNR (do not resuscitate)   Palliative care by specialist   Weakness   Brief Hospital Course:  Jim Little is a 76 y.o.male with a history of metastatic [place], 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 His hospital course is detailed below:  Metastatic hepatocellular carcinoma Admitted for supportive care and transition to hospice services for metastatic [facility]. Palliative care consulted and patient transitioned to DNR. Abdominal pain likely secondary to mass obstruction and patient pain was managed with sch Tylenol and Oxycodone. Obtained CT head due to recent falls, negative for acute process. Per oncologist, no further treatment is recommended at this time and patient was established with home hospice services.  Other chronic conditions were medically managed with home medications and formulary alternatives as necessary (hypothyroidism, HTN)  Disposition: Home w/hospice  Discharge Condition: Hospice care  Discharge Exam:  Vitals:   11/11/22 0510 11/11/22 0723  BP: (!) 154/83 (!) 175/86  Pulse: 89 87  Resp: 16 18  Temp: 98.2 F (36.8 C) 97.8 F (36.6 C)  SpO2: 96% 94%   General: Well-appearing. Alert. NAD CV: RRR without murmur Pulm: CTAB. Normal WOB on  RA. No wheezing Abdomen: Soft, non-tender, non-distended. +BS Ext: Well perfused. Cap refill < 3 seconds Skin: Warm, dry. No rashes noted   Significant Procedures: None  Significant Labs and Imaging:  Recent Labs  Lab 11/10/22 0304  WBC 4.0  HGB 11.0*  HCT 31.2*  PLT 132*   Recent Labs  Lab 11/10/22 0304  NA 133*  K 3.7  CL 103  CO2 24  GLUCOSE 113*  BUN 12  CREATININE 0.69  CALCIUM 8.4*  ALKPHOS 120  AST 51*  ALT 28  ALBUMIN 2.1*    Pertinent Imaging: CT HEAD WO CONTRAST (5MM) Result Date: 11/09/2022 IMPRESSION: 1. No acute intracranial abnormality or acute traumatic injury identified. 2. 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   DG Abd 1 View Result Date: 11/09/2022 IMPRESSION: Nonobstructive bowel gas pattern.  CT ABDOMEN PELVIS W CONTRAST Result Date: 10/24/2022 IMPRESSION: 1. Cirrhotic appearance of the liver with at least 2 lesions 1 of which is up to 7 cm in diameter. 2. Massive malignant-appearing retroperitoneal para-aortic/aortocaval adenopathy. 3. Innumerable metastatic lesions in the lungs representing progression. 4. Urinary bladder wall thickening that is nonspecific.   Results/Tests Pending at Time of Discharge: None  Discharge Medications:  Allergies as of 11/11/2022       Reactions   Pork-derived Products    Religious reasons.Patient is a muslim        Medication List     STOP taking these medications    HYDROcodone-acetaminophen 5-325 MG tablet Commonly known as: NORCO/VICODIN   [place] 0.7 % Soln Generic drug: Olopatadine HCl       TAKE these medications    amLODipine 10 MG tablet  Commonly known as: NORVASC Take 1 tablet (10 mg total) by mouth daily.   diclofenac Sodium 1 % Gel Commonly known as: Voltaren Apply to L shoulder area   folic acid 1 MG tablet Commonly known as: FOLVITE Take 1 tablet by mouth daily.   lactulose 10 GM/15ML  solution Commonly known as: CHRONULAC Take 15 mLs (10 g total) by mouth 3 (three) times daily. What changed:  when to take this reasons to take this   Lenvima (12 MG Daily Dose) 3 x 4 MG capsule Generic drug: lenvatinib 12 mg daily dose Take 12 mg by mouth daily. Take as instructed by MD   levothyroxine 112 MCG tablet Commonly known as: SYNTHROID Take 1 tablet (112 mcg total) by mouth daily before breakfast. Start taking on: November 12, 2022 What changed:  medication strength how much to take when to take this additional instructions   omeprazole 40 MG capsule Commonly known as: PRILOSEC Take 1 capsule (40 mg total) by mouth 2 (two) times daily.   oxyCODONE 5 MG immediate release tablet Commonly known as: Oxy IR/ROXICODONE Take 1 tablet (5 mg total) by mouth every 6 (six) hours.   senna 8.6 MG Tabs tablet Commonly known as: SENOKOT Take 1 tablet (8.6 mg total) by mouth daily. Start taking on: November 12, 2022   tamsulosin 0.4 MG Caps capsule Commonly known as: FLOMAX Take 1 capsule (0.4 mg total) by mouth daily. Please pack and mail to home               [place]  (From admission, onward)           Start     Ordered   11/11/22 0000  DME Bedside commode       Question:  Patient needs a bedside commode to treat with the following condition  Answer:  Metastatic cancer ([place])   11/11/22 1112   11/11/22 0000  For home use only DME Hospital bed       Question Answer Comment  Length of Need Lifetime   Patient has (list medical condition): Metastatic cancer   Bed type Semi-electric      11/11/22 1112            Discharge Instructions: Please refer to Patient Instructions section of EMR for full details.  Patient was counseled important signs and symptoms that should prompt return to medical care, changes in medications, dietary instructions, activity restrictions, and follow up appointments.     Colletta Maryland, MD 11/11/2022,  11:42 AM PGY-1, [place] Upper-Level Resident Addendum   I have independently interviewed and examined the patient. I have discussed the above with Dr. Jerilee Hoh and agree with the documented plan. My edits for correction/addition/clarification are included above. Please see any attending notes.   Alcus Dad, MD PGY-3, [place] Medicine 11/11/2022 11:51 AM  FPTS Service pager: [phone] (text pages welcome through [facility])

## 2022-11-11 NOTE — Progress Notes (Signed)
The patient speaks Swahili as their primary language.  An interpreter was used for the entire visit.  Called and spoke with patient's wife. Updated her on patient's condition with his permission. Discussed plan of care to return home with hospice. Family agreeable to take patient home today at 3 PM. All questions answered--anticipate family will need continued clarification around hospice as this information was new to wife today.  [name], MD  Family Medicine Teaching Service

## 2022-11-11 NOTE — Progress Notes (Signed)
Manufacturing engineer [facility]) Hospital Liaison Note  Referral received for patient/family interest in hospice at home. No family present at the bedside and patient does not speak english.   Referral has been sent into [facility] referral center and someone will reach out to the family to get them scheduled for an admission visit.   Please call with any questions or concerns. Thank you  Roselee Nova, [place] Hospital Liaison [phone]

## 2022-11-12 ENCOUNTER — Telehealth: Payer: Self-pay

## 2022-11-12 NOTE — Congregational Nurse Program (Signed)
  Dept: [phone]   Congregational Nurse Program Note  Date of Encounter: 11/12/2022  Past Medical History: Past Medical History:  Diagnosis Date   BPH (benign prostatic hyperplasia)    Cirrhosis ([place])    Hypertension    Splenomegaly     Encounter Details:  CNP Questionnaire - 11/12/22 1629       Questionnaire   Ask client: Do you give verbal consent for me to treat you today? Yes    Student Assistance N/A    Location Patient Served  [facility]    Visit Setting with Client Phone/Text/Email    Patient Status Refugee    Insurance Medicaid    Insurance/Financial Assistance Referral N/A    Medication Have Medication Insecurities;Provided Medication Assistance    Medical Provider Yes    Screening Referrals Made N/A    Medical Referrals Made N/A    Medical Appointment Made N/A    Recently w/o PCP, now 1st time PCP visit completed due to CNs referral or appointment made N/A    Food N/A    Transportation Need transportation assistance    Housing/Utilities N/A    Interpersonal Safety N/A    Interventions Advocate/Support;Navigate Healthcare System;Case Management;Counsel;Educate    Abnormal to Normal Screening Since Last CN Visit N/A    Screenings CN Performed N/A    Sent Client to Lab for: N/A    Did client attend any of the following based off CNs referral or appointments made? N/A    ED Visit Averted N/A    Life-Saving Intervention Made N/A            Received a call from [facility] and connected with patient and wife via 3 way call. Home visit scheduled for tomorrow at 9 am with [facility] nurse.  [name] Navreet Bolda RN BSN [place] Nurse [phone]

## 2022-11-12 NOTE — Telephone Encounter (Signed)
Transition Care Management Follow-up Telephone Call Date of discharge and from where: [facility] 11/11/2022 How have you been since you were released from the hospital? weak Any questions or concerns? No  Items Reviewed: Did the pt receive and understand the discharge instructions provided? Yes  Medications obtained and verified? Yes  Other? No  Any new allergies since your discharge? No  Dietary orders reviewed? Yes Do you have support at home? Yes   Home Care and Equipment/Supplies: Were home health services ordered? no If so, what is the name of the agency? N/a  Has the agency set up a time to come to the patient's home? not applicable Were any new equipment or medical supplies ordered?  No What is the name of the medical supply agency? N/a Were you able to get the supplies/equipment? not applicable Do you have any questions related to the use of the equipment or supplies? No  Functional Questionnaire: (I = Independent and D = Dependent) ADLs: D  Bathing/Dressing- D  Meal Prep- D  Eating- I  Maintaining continence- D  Transferring/Ambulation- D  Managing Meds- D  Follow up appointments reviewed:  PCP Hospital f/u appt confirmed? Yes  Scheduled to see Dr Sherry Ruffing 11/15/2022 @ 8:30 Specialist Hospital f/u appt confirmed? No   Are transportation arrangements needed? No  If their condition worsens, is the pt aware to call PCP or go to the Emergency Dept.? Yes Was the patient provided with contact information for the PCP's office or ED? Yes Was to pt encouraged to call back with questions or concerns? Yes Juanda Crumble, LPN [place] Direct Dial [phone]

## 2022-11-13 ENCOUNTER — Other Ambulatory Visit: Payer: Self-pay

## 2022-11-14 ENCOUNTER — Telehealth: Payer: Self-pay | Admitting: Family Medicine

## 2022-11-14 NOTE — Telephone Encounter (Signed)
Called patient and confirmed visit for Friday AM.  [name], MD  [facility] Medicine Teaching Service

## 2022-11-14 NOTE — Telephone Encounter (Signed)
Attempted to call X2 about home visit on Friday.  [name], MD  Family Medicine Teaching Service

## 2022-11-15 ENCOUNTER — Telehealth: Payer: Self-pay

## 2022-11-15 ENCOUNTER — Telehealth: Payer: Self-pay | Admitting: Family Medicine

## 2022-11-15 ENCOUNTER — Ambulatory Visit: Payer: Medicaid Other | Admitting: Family Medicine

## 2022-11-15 ENCOUNTER — Ambulatory Visit (INDEPENDENT_AMBULATORY_CARE_PROVIDER_SITE_OTHER): Payer: Medicaid Other | Admitting: Family Medicine

## 2022-11-15 ENCOUNTER — Encounter: Payer: Self-pay | Admitting: Family Medicine

## 2022-11-15 VITALS — BP 160/100 | HR 72

## 2022-11-15 DIAGNOSIS — R339 Retention of urine, unspecified: Secondary | ICD-10-CM

## 2022-11-15 DIAGNOSIS — Z5941 Food insecurity: Secondary | ICD-10-CM | POA: Diagnosis not present

## 2022-11-15 DIAGNOSIS — Z515 Encounter for palliative care: Secondary | ICD-10-CM | POA: Insufficient documentation

## 2022-11-15 NOTE — Assessment & Plan Note (Signed)
Not issue currently Anticipate he will need Foley placed as his condition progresses

## 2022-11-15 NOTE — Telephone Encounter (Signed)
Two backpack beginnings bags delivered to family. [name], MD  Family Medicine Teaching Service

## 2022-11-15 NOTE — Progress Notes (Signed)
    SUBJECTIVE:   The patient speaks Swahili as their primary language.  An interpreter was used for the entire visit--- Jim Little. The visit was conducted in the patient's home.   CHIEF COMPLAINT: abdominal pain  HPI:   Jim Little is a 76 y.o.  with history notable for hepatocellular carcinoma, hypothyroidism, and HTN presenting for home visit. His wife is home with him this morning. He is in his bed with his youngest son, Jim Little.  The patient endorses abdominal pain. Has not had bowel  movement since leaving the hospital. Wife feels better equipped to care for him now. Hospice has visited home. His Imam is coming at 3 PM today. He has not taken any of his oxycodone. He is taking only flomax and lactulose. He is voiding well. No dribbling or retention issues. Does not yet have hospital bed or bedside commode.   He feels overall well and is at peace with his diagnosis. He is aware that his cancer has spread and that his life 'will be short, but it is God's will'. His wife is curious as to how the cancer spread so fast when in July he was doing well. She feels she has enough support. Her biggest concerns are: Food: Family was denied food stamps. They do not have enough cornflower to make ugali.  They did not pay the December electricity bill    PERTINENT  PMH / PSH/Family/Social History : updated and reviewed  OBJECTIVE:   BP (!) 160/100   Pulse 72   SpO2 96%    Tired man, alert to person, place and situation Laying flat in bed  Cardiac: Regular rate and rhythm. Normal S1/S2. No murmurs, rubs, or gallops appreciated. Lungs: Clear bilaterally to ascultation.  Abdomen: Normoactive bowel sounds. Distended but soft abdomen LLQ pain on palpation  Psych: Pleasant and appropriate    ASSESSMENT/PLAN:   Urinary retention Not issue currently Anticipate he will need Foley placed as his condition progresses   Hospice care patient Discussed concept of hospice with wife again Reviewed  prognosis and recommendations from oncology with wife and patient Laid out medications on bedside stand and used teachback method--- oxycodone for pain, daily senna, increase lactulose from BID to TID Reached out to hospice about hospital bed and bedside commode   Food insecurity Called hospice triage nurse Will escalate issues related to utilities and food to SW [place]  Referral to CCM for SW as the remainder of family will also need assistance (most [facility] patients) Health visitor beginnings 2 bags to family later today        Jim Singh, MD  [place]

## 2022-11-15 NOTE — Telephone Encounter (Signed)
Contacted patient and spoke with spouse. Dr Owens Shark visited with family earlier and wife expressed her gratitude. She expressed concerns that patient is not eating and only drinking water. He has not had a bowel movement for more than 5 days. Noted Md's instructions to increase lactulose to three times a day. Instructions re-emphasized. I will continue to offer support as needed,  Honor Loh RN BSN [place] Nurse [phone]

## 2022-11-15 NOTE — Assessment & Plan Note (Signed)
Discussed concept of hospice with wife again Reviewed prognosis and recommendations from oncology with wife and patient Laid out medications on bedside stand and used teachback method--- oxycodone for pain, daily senna, increase lactulose from BID to TID Reached out to hospice about hospital bed and bedside commode

## 2022-11-15 NOTE — Assessment & Plan Note (Signed)
Called hospice triage nurse Will escalate issues related to utilities and food to SW [place]  Referral to CCM for SW as the remainder of family will also need assistance (most [facility] patients) Backback beginnings 2 bags to family later today

## 2022-11-17 ENCOUNTER — Telehealth: Payer: Self-pay

## 2022-11-17 NOTE — Telephone Encounter (Signed)
Checked on patient via telephone. Spoke with wife. Patient`s wife stated that condition is unchanged. Not eating and only drinking minimally.He declined porridge and ugali but was able to drink soda.No bowel movement but voiding ok. Yesterday he was assisted to sit out of bed for linen change. Wife would like assistance to get to the nearest laundromat to wash his clothes. Melburn Popper ride provided. I will continue to assist as needed.  [name] Brenn Gatton RN BSN [place] Nurse [phone]

## 2022-11-19 ENCOUNTER — Telehealth: Payer: Self-pay | Admitting: Family Medicine

## 2022-11-19 ENCOUNTER — Inpatient Hospital Stay: Payer: Medicaid Other | Admitting: Nurse Practitioner

## 2022-11-19 ENCOUNTER — Inpatient Hospital Stay: Payer: Medicaid Other

## 2022-11-19 NOTE — Congregational Nurse Program (Signed)
I have reached out to [facility] Triage and the hospice nurse will make a visit tomorrow Wednesday 11/21/27.  [name] Leonilda Cozby RN BSN [place] Nurse [phone]

## 2022-11-19 NOTE — Telephone Encounter (Signed)
Received note patient having bloody sputum with coughing. Has known [place] metastasis to lungs. Called hospice, they will Rx concentrated oxycodone for secretions/cough.  [name], MD  Family Medicine Teaching Service

## 2022-11-19 NOTE — Congregational Nurse Program (Signed)
  Dept: [phone]   Congregational Nurse Program Note  Date of Encounter: 11/19/2022  Past Medical History: Past Medical History:  Diagnosis Date   BPH (benign prostatic hyperplasia)    Cirrhosis ([place])    Hypertension    Splenomegaly     Encounter Details:  CNP Questionnaire - 11/19/22 1335       Questionnaire   Ask client: Do you give verbal consent for me to treat you today? Yes    Student Assistance N/A    Location Patient Served  [facility]    Visit Setting with Client Telehealth    Patient Status Refugee    Insurance Medicaid    Insurance/Financial Assistance Referral N/A    Medication Have Medication Insecurities    Medical Provider Yes    Screening Referrals Made N/A    Medical Referrals Made N/A    Medical Appointment Made N/A    Recently w/o PCP, now 1st time PCP visit completed due to CNs referral or appointment made N/A    Food N/A    Transportation Need transportation assistance    Housing/Utilities N/A    Interpersonal Safety N/A    Interventions Advocate/Support;Navigate Healthcare System;Case Management;Counsel;Educate;Spiritual Care    Abnormal to Normal Screening Since Last CN Visit N/A    Screenings CN Performed N/A    Sent Client to Lab for: N/A    Did client attend any of the following based off CNs referral or appointments made? N/A    ED Visit Averted Yes    Life-Saving Intervention Made N/A           Contacted patient to check on immediate needs for today.Spoke with patient spouse. She reported that patient is uncomfortable due to coughing. She reported that patient is coughing up blood.   I will reach out to MD and hospice nurse for advise.  [name] Mechell Girgis RN BSN [place] Nurse [phone]

## 2022-11-21 NOTE — Congregational Nurse Program (Signed)
Called patient and spoke with daughter in law Jim Little. Morphine was delivered yesterday and hospice nurse provided education on medication administration.  Patient was able to eat some porridge today.  Jim Server Nemiah Kissner RN BSN [place] Nurse [phone]

## 2022-11-22 ENCOUNTER — Other Ambulatory Visit: Payer: Medicaid Other

## 2022-11-22 NOTE — Patient Instructions (Signed)
  Medicaid Managed Care   Unsuccessful Outreach Note  11/22/2022 Name: Jim Little MRN: [id number] DOB: 08-22-1946  Referred by: [name], MD Reason for referral : High Risk Managed Medicaid (MM Social work telephone outreach )   An unsuccessful telephone outreach was attempted today. The patient was referred to the case management team for assistance with care management and care coordination.   Follow Up Plan: The care management team will reach out to the patient again over the next 7 days.   Mickel Fuchs, BSW, [place] Managed Medicaid Team  [phone]

## 2022-11-22 NOTE — Congregational Nurse Program (Signed)
  Dept: [phone]   Congregational Nurse Program Note  Date of Encounter: 11/22/2022  Past Medical History: Past Medical History:  Diagnosis Date   BPH (benign prostatic hyperplasia)    Cirrhosis ([place])    Hypertension    Splenomegaly     Encounter Details:  CNP Questionnaire - 11/22/22 1516       Questionnaire   Ask client: Do you give verbal consent for me to treat you today? Yes    Student Assistance N/A    Location Patient Served  [facility]    Visit Setting with Client Phone/Text/Email    Patient Status Refugee    Insurance Medicaid    Insurance/Financial Assistance Referral N/A    Medication Have Medication Insecurities;Provided Medication Assistance    Medical Provider Yes    Screening Referrals Made N/A    Medical Referrals Made N/A    Medical Appointment Made N/A    Recently w/o PCP, now 1st time PCP visit completed due to CNs referral or appointment made N/A    Food Have Food Insecurities;Referred to Food Pantry    Transportation Need transportation assistance    Housing/Utilities Unable to pay for utilities    Interpersonal Safety N/A    Interventions Advocate/Support;Navigate Healthcare System;Case Management;Counsel;Educate    Abnormal to Normal Screening Since Last CN Visit N/A    Screenings CN Performed N/A    Sent Client to Lab for: N/A    Did client attend any of the following based off CNs referral or appointments made? N/A    ED Visit Averted Yes    Life-Saving Intervention Made N/A            Contacted patient and spoke with patient and his wife. Family is experiencing food insecurity. Food stump cards working.Wife stated that all paper work was submitted in November. Daughter in law attempted to call DSS to check on progress unsuccessfully. I have requested patient wife to go to DSS in person after the new years holiday to check on re certification progress.  Patient is complaining of pain but stated that the hospice nurse has medicated him with  morphine.I will ask his daughter in law to keep checking on him every 4 hours and medicate as per Md`s instructions.  [name] Yoland Scherr RN BSN [place] Nurse [phone]

## 2022-11-22 NOTE — Patient Outreach (Signed)
  Medicaid Managed Care   Unsuccessful Outreach Note  11/22/2022 Name: Jim Little MRN: [id number] DOB: [birth year]  Referred by: [name], MD Reason for referral : High Risk Managed Medicaid (MM Social work telephone outreach )   An unsuccessful telephone outreach was attempted today. The patient was referred to the case management team for assistance with care management and care coordination.   Follow Up Plan: The care management team will reach out to the patient again over the next 7 days.   Mickel Fuchs, BSW, [place] Managed Medicaid Team  [phone]

## 2022-11-23 NOTE — Congregational Nurse Program (Signed)
  Dept: [phone]   Congregational Nurse Program Note  Date of Encounter: 11/23/2022  Past Medical History: Past Medical History:  Diagnosis Date   BPH (benign prostatic hyperplasia)    Cirrhosis ([place])    Hypertension    Splenomegaly     Encounter Details:  CNP Questionnaire - 11/23/22 1318       Questionnaire   Ask client: Do you give verbal consent for me to treat you today? Yes    Student Assistance N/A    Location Patient Served  [facility]    Visit Setting with Client Home    Patient Status Refugee    Insurance Medicaid    Insurance/Financial Assistance Referral N/A    Medication Have Medication Insecurities;Provided Medication Assistance    Medical Provider Yes    Screening Referrals Made N/A    Medical Referrals Made N/A    Medical Appointment Made N/A    Recently w/o PCP, now 1st time PCP visit completed due to CNs referral or appointment made N/A    Food N/A    Transportation Need transportation assistance    Housing/Utilities Unable to pay for utilities    Interpersonal Safety N/A    Interventions Advocate/Support;Navigate Healthcare System;Case Management;Counsel;Educate    Abnormal to Normal Screening Since Last CN Visit N/A    Screenings CN Performed N/A    Sent Client to Lab for: N/A    Did client attend any of the following based off CNs referral or appointments made? N/A    ED Visit Averted Yes    Life-Saving Intervention Made N/A           Home visit completed. Patient in his bedroom with his wife and 2 younger sons. Patient stated that he is in "a lot of pain". Wife does not remember the last time he was medicated for pain. Patient medicated with 0.25 mls morphine by mouth per the instruction. Family informed that this can be repeated every 4-6 hours. I will call  around 3 pm to reassess.  [name] Culver Feighner RN BSN [place] Nurse [phone]

## 2022-11-26 ENCOUNTER — Telehealth: Payer: Self-pay

## 2022-11-26 NOTE — Patient Outreach (Signed)
BSW contacted [facility] and spoke with patients social worker Sonia Baller. She states they are aware of the food insecurity in patients home, they have taken 2 food bags to the home since patient started hospice with them on 11/13/22. Sonia Baller has encouraged family to follow up with the foodstamp office.   Mickel Fuchs, BSW, [place] Managed Medicaid Team  [phone]

## 2022-11-28 ENCOUNTER — Telehealth: Payer: Self-pay | Admitting: Family Medicine

## 2022-11-28 NOTE — Telephone Encounter (Signed)
Received message from [place] that patient's family having difficulty keeping up with patient's needs. Called [facility] about condition, check to see if can move to inpatient facility.  [name], MD  Family Medicine Teaching Service

## 2022-11-28 NOTE — Progress Notes (Signed)
This encounter was created in error - please disregard.

## 2022-11-28 NOTE — Congregational Nurse Program (Signed)
  Dept: [phone]   Congregational Nurse Program Note  Date of Encounter: 11/28/2022  Past Medical History: Past Medical History:  Diagnosis Date   BPH (benign prostatic hyperplasia)    Cirrhosis ([place])    Hypertension    Splenomegaly     Encounter Details:  CNP Questionnaire - 11/28/22 1600       Questionnaire   Ask client: Do you give verbal consent for me to treat you today? Yes    Student Assistance N/A    Location Patient Served  [facility]    Visit Setting with Client Phone/Text/Email    Patient Status Refugee    Insurance Medicaid    Insurance/Financial Assistance Referral N/A    Medication Have Medication Insecurities;Provided Medication Assistance    Medical Provider Yes    Screening Referrals Made N/A    Medical Referrals Made N/A    Medical Appointment Made N/A    Recently w/o PCP, now 1st time PCP visit completed due to CNs referral or appointment made N/A    Food Have Food Insecurities;Referred to Food Pantry    Transportation Need transportation assistance    Housing/Utilities Unable to pay for utilities    Interpersonal Safety N/A    Interventions Advocate/Support;Navigate Healthcare System;Case Management;Counsel;Educate    Abnormal to Normal Screening Since Last CN Visit N/A    Screenings CN Performed N/A    Sent Client to Lab for: N/A    Did client attend any of the following based off CNs referral or appointments made? N/A    ED Visit Averted Yes    Life-Saving Intervention Made N/A            1645 hrs- Home visit done. Patient at home with daughter in law and young kids. Spouse at work.   Patient is lethargic and oriented to self only. He is incontinent. Daughter in law and young  children unable to keep him dry and comfortable while spouse is away working.He has not been getting his pain medication.   Dr Owens Shark made aware and contacted [facility] for possible move to inpatient hospice.  [name] Joelly Bolanos RN BSN [place] Nurse [phone]

## 2022-12-01 ENCOUNTER — Telehealth: Payer: Self-pay

## 2022-12-01 NOTE — Telephone Encounter (Signed)
Spouse called me with concerns that patient feels hot and with labored breathing.Advised to administer morphine as ordered for discomfort. Dr Owens Shark updated.  [name] Annet Manukyan RN BSN [place] Nurse [phone]

## 2022-12-01 NOTE — Congregational Nurse Program (Signed)
  Dept: [phone]   Congregational Nurse Program Note  Date of Encounter: 12/01/2022  Past Medical History: Past Medical History:  Diagnosis Date   BPH (benign prostatic hyperplasia)    Cirrhosis ([place])    Hypertension    Splenomegaly     Encounter Details:  CNP Questionnaire - 12/01/22 1600       Questionnaire   Ask client: Do you give verbal consent for me to treat you today? Yes    Student Assistance N/A    Location Patient Served  [facility]    Visit Setting with Client Home    Patient Status Refugee    Insurance Medicaid    Insurance/Financial Assistance Referral N/A    Medication Have Medication Insecurities;Provided Medication Assistance    Medical Provider Yes    Screening Referrals Made N/A    Medical Referrals Made N/A    Medical Appointment Made N/A    Recently w/o PCP, now 1st time PCP visit completed due to CNs referral or appointment made N/A    Food Have Food Insecurities;Referred to Food Pantry    Transportation Need transportation assistance    Housing/Utilities Unable to pay for utilities    Interpersonal Safety N/A    Interventions Advocate/Support;Navigate Healthcare System;Case Management;Counsel;Educate    Abnormal to Normal Screening Since Last CN Visit N/A    Screenings CN Performed N/A    Sent Client to Lab for: N/A    Did client attend any of the following based off CNs referral or appointments made? N/A    ED Visit Averted Yes    Life-Saving Intervention Made N/A           Home visit done- Patient at home with wife and children. Patient receiving hospice care at home. Wife has taken time off work to take care of him.  Patient is non responsive to repeated calling and touching. He is appears comfortable without signs of pain.He received morphine early morning and he has been drowsy since then. Family is aware to reach out to me or the hospice nurse for support.  [name] Katara Griner RN BSN [place] Nurse [phone]

## 2022-12-02 ENCOUNTER — Telehealth: Payer: Self-pay

## 2022-12-02 ENCOUNTER — Telehealth: Payer: Self-pay | Admitting: Family Medicine

## 2022-12-02 NOTE — Telephone Encounter (Signed)
Called patient's family with [place]ili interpreter. Expressed condolences, appreciative of call. Note for wife for work. Reports still having food insecurity. Referral placed in stepdaughter's chart.  [name], MD  Family Medicine Teaching Service

## 2022-12-02 NOTE — Telephone Encounter (Signed)
1025 am I received a call from daughter in law that patient has passed on. She was tearful and support offered over the phone. [facility] hospice was on the way to the residence. I will continue to offer support as needed.  [name] Markesia Crilly RN BSN [place] Nurse [phone]

## 2022-12-06 ENCOUNTER — Other Ambulatory Visit (HOSPITAL_COMMUNITY): Payer: Self-pay

## 2022-12-17 ENCOUNTER — Inpatient Hospital Stay: Payer: Medicaid Other | Admitting: Nurse Practitioner

## 2022-12-17 ENCOUNTER — Inpatient Hospital Stay: Payer: Medicaid Other

## 2022-12-26 DEATH — deceased
